# Patient Record
Sex: Female | Born: 1950 | Race: White | Hispanic: No | Marital: Married | Smoking: Former smoker
Health system: Southern US, Community
[De-identification: ages and names within clinical notes are randomized; demographics above are authoritative.]

## PROBLEM LIST (undated history)

## (undated) DIAGNOSIS — R112 Nausea with vomiting, unspecified: Secondary | ICD-10-CM

## (undated) DIAGNOSIS — F4001 Agoraphobia with panic disorder: Secondary | ICD-10-CM

## (undated) DIAGNOSIS — Z9889 Other specified postprocedural states: Secondary | ICD-10-CM

## (undated) DIAGNOSIS — I10 Essential (primary) hypertension: Secondary | ICD-10-CM

## (undated) DIAGNOSIS — I480 Paroxysmal atrial fibrillation: Secondary | ICD-10-CM

## (undated) DIAGNOSIS — F419 Anxiety disorder, unspecified: Secondary | ICD-10-CM

## (undated) DIAGNOSIS — K219 Gastro-esophageal reflux disease without esophagitis: Secondary | ICD-10-CM

## (undated) DIAGNOSIS — I959 Hypotension, unspecified: Secondary | ICD-10-CM

## (undated) DIAGNOSIS — F329 Major depressive disorder, single episode, unspecified: Secondary | ICD-10-CM

## (undated) DIAGNOSIS — T8859XA Other complications of anesthesia, initial encounter: Secondary | ICD-10-CM

## (undated) DIAGNOSIS — T4145XA Adverse effect of unspecified anesthetic, initial encounter: Secondary | ICD-10-CM

## (undated) DIAGNOSIS — K635 Polyp of colon: Secondary | ICD-10-CM

## (undated) DIAGNOSIS — I639 Cerebral infarction, unspecified: Secondary | ICD-10-CM

## (undated) DIAGNOSIS — E119 Type 2 diabetes mellitus without complications: Secondary | ICD-10-CM

## (undated) DIAGNOSIS — M1990 Unspecified osteoarthritis, unspecified site: Secondary | ICD-10-CM

## (undated) DIAGNOSIS — F32A Depression, unspecified: Secondary | ICD-10-CM

## (undated) DIAGNOSIS — G8929 Other chronic pain: Secondary | ICD-10-CM

## (undated) DIAGNOSIS — E669 Obesity, unspecified: Secondary | ICD-10-CM

## (undated) DIAGNOSIS — E039 Hypothyroidism, unspecified: Secondary | ICD-10-CM

## (undated) HISTORY — PX: ABDOMINAL HYSTERECTOMY: SHX81

## (undated) HISTORY — DX: Major depressive disorder, single episode, unspecified: F32.9

## (undated) HISTORY — DX: Obesity, unspecified: E66.9

## (undated) HISTORY — DX: Agoraphobia with panic disorder: F40.01

## (undated) HISTORY — DX: Depression, unspecified: F32.A

## (undated) HISTORY — DX: Type 2 diabetes mellitus without complications: E11.9

## (undated) HISTORY — DX: Anxiety disorder, unspecified: F41.9

## (undated) HISTORY — DX: Cerebral infarction, unspecified: I63.9

## (undated) HISTORY — PX: CHOLECYSTECTOMY: SHX55

## (undated) HISTORY — DX: Essential (primary) hypertension: I10

## (undated) HISTORY — DX: Polyp of colon: K63.5

## (undated) HISTORY — DX: Paroxysmal atrial fibrillation: I48.0

## (undated) HISTORY — DX: Hypotension, unspecified: I95.9

---

## 2002-07-17 HISTORY — PX: CARDIAC CATHETERIZATION: SHX172

## 2003-04-05 ENCOUNTER — Inpatient Hospital Stay (HOSPITAL_COMMUNITY): Admission: EM | Admit: 2003-04-05 | Discharge: 2003-04-06 | Payer: Self-pay | Admitting: Cardiology

## 2004-09-20 ENCOUNTER — Emergency Department (HOSPITAL_COMMUNITY): Admission: EM | Admit: 2004-09-20 | Discharge: 2004-09-20 | Payer: Self-pay | Admitting: Emergency Medicine

## 2006-07-29 ENCOUNTER — Emergency Department (HOSPITAL_COMMUNITY): Admission: EM | Admit: 2006-07-29 | Discharge: 2006-07-29 | Payer: Self-pay | Admitting: Emergency Medicine

## 2006-10-22 ENCOUNTER — Emergency Department (HOSPITAL_COMMUNITY): Admission: EM | Admit: 2006-10-22 | Discharge: 2006-10-22 | Payer: Self-pay | Admitting: Emergency Medicine

## 2008-05-16 ENCOUNTER — Emergency Department (HOSPITAL_COMMUNITY): Admission: EM | Admit: 2008-05-16 | Discharge: 2008-05-16 | Payer: Self-pay | Admitting: Emergency Medicine

## 2008-08-25 ENCOUNTER — Emergency Department (HOSPITAL_COMMUNITY): Admission: EM | Admit: 2008-08-25 | Discharge: 2008-08-25 | Payer: Self-pay | Admitting: Emergency Medicine

## 2008-09-16 ENCOUNTER — Ambulatory Visit (HOSPITAL_COMMUNITY): Admission: RE | Admit: 2008-09-16 | Discharge: 2008-09-16 | Payer: Self-pay | Admitting: Family Medicine

## 2008-10-03 ENCOUNTER — Emergency Department (HOSPITAL_COMMUNITY): Admission: EM | Admit: 2008-10-03 | Discharge: 2008-10-03 | Payer: Self-pay | Admitting: Emergency Medicine

## 2008-10-30 ENCOUNTER — Ambulatory Visit (HOSPITAL_COMMUNITY): Admission: RE | Admit: 2008-10-30 | Discharge: 2008-10-30 | Payer: Self-pay | Admitting: Emergency Medicine

## 2008-10-30 ENCOUNTER — Emergency Department (HOSPITAL_COMMUNITY): Admission: EM | Admit: 2008-10-30 | Discharge: 2008-10-30 | Payer: Self-pay | Admitting: Emergency Medicine

## 2008-11-15 ENCOUNTER — Emergency Department (HOSPITAL_COMMUNITY): Admission: EM | Admit: 2008-11-15 | Discharge: 2008-11-15 | Payer: Self-pay | Admitting: Emergency Medicine

## 2008-12-09 ENCOUNTER — Encounter (HOSPITAL_COMMUNITY): Admission: RE | Admit: 2008-12-09 | Discharge: 2009-01-08 | Payer: Self-pay | Admitting: Orthopaedic Surgery

## 2008-12-14 ENCOUNTER — Emergency Department (HOSPITAL_COMMUNITY): Admission: EM | Admit: 2008-12-14 | Discharge: 2008-12-14 | Payer: Self-pay | Admitting: Emergency Medicine

## 2009-01-21 ENCOUNTER — Encounter (HOSPITAL_COMMUNITY): Admission: RE | Admit: 2009-01-21 | Discharge: 2009-02-20 | Payer: Self-pay | Admitting: Orthopaedic Surgery

## 2009-05-07 ENCOUNTER — Emergency Department (HOSPITAL_COMMUNITY): Admission: EM | Admit: 2009-05-07 | Discharge: 2009-05-07 | Payer: Self-pay | Admitting: Emergency Medicine

## 2009-12-12 ENCOUNTER — Emergency Department (HOSPITAL_COMMUNITY): Admission: EM | Admit: 2009-12-12 | Discharge: 2009-12-12 | Payer: Self-pay | Admitting: Emergency Medicine

## 2009-12-13 ENCOUNTER — Ambulatory Visit (HOSPITAL_COMMUNITY): Admission: RE | Admit: 2009-12-13 | Discharge: 2009-12-13 | Payer: Self-pay | Admitting: Family Medicine

## 2010-08-08 ENCOUNTER — Encounter: Payer: Self-pay | Admitting: Orthopaedic Surgery

## 2010-10-03 LAB — URINE MICROSCOPIC-ADD ON

## 2010-10-03 LAB — COMPREHENSIVE METABOLIC PANEL
ALT: 18 U/L (ref 0–35)
AST: 19 U/L (ref 0–37)
Alkaline Phosphatase: 76 U/L (ref 39–117)
CO2: 25 mEq/L (ref 19–32)
Calcium: 9.4 mg/dL (ref 8.4–10.5)
GFR calc Af Amer: >60 mL/min (ref >60)
GFR calc non Af Amer: >60 mL/min (ref >60)
Potassium: 4.2 mEq/L (ref 3.5–5.1)
Sodium: 133 mEq/L — ABNORMAL LOW (ref 135–145)
Total Protein: 6.9 g/dL (ref 6.0–8.3)

## 2010-10-03 LAB — CBC
Hemoglobin: 11.8 g/dL — ABNORMAL LOW (ref 12.0–15.0)
MCHC: 33.9 g/dL (ref 30.0–36.0)
RBC: 3.75 MIL/uL — ABNORMAL LOW (ref 3.87–5.11)

## 2010-10-03 LAB — URINALYSIS, ROUTINE W REFLEX MICROSCOPIC
Bilirubin Urine: NEGATIVE
Glucose, UA: NEGATIVE mg/dL
Hgb urine dipstick: NEGATIVE
Specific Gravity, Urine: >1.03 — ABNORMAL HIGH (ref 1.005–1.030)
Urobilinogen, UA: 0.2 mg/dL (ref 0.0–1.0)
pH: 5 (ref 5.0–8.0)

## 2010-10-03 LAB — DIFFERENTIAL
Basophils Relative: 0 % (ref 0–1)
Eosinophils Absolute: 0.1 10*3/uL (ref 0.0–0.7)
Eosinophils Relative: 2 % (ref 0–5)
Lymphs Abs: 2.3 10*3/uL (ref 0.7–4.0)
Monocytes Relative: 4 % (ref 3–12)

## 2010-10-03 LAB — URINE CULTURE

## 2010-10-27 LAB — GLUCOSE, CAPILLARY: Glucose-Capillary: 201 mg/dL — ABNORMAL HIGH (ref 70–99)

## 2010-11-07 ENCOUNTER — Emergency Department (HOSPITAL_COMMUNITY)
Admission: EM | Admit: 2010-11-07 | Discharge: 2010-11-07 | Disposition: A | Discharge status: Home / Self Care | Payer: Medicaid Other | Attending: Emergency Medicine | Admitting: Emergency Medicine

## 2010-11-07 DIAGNOSIS — R112 Nausea with vomiting, unspecified: Secondary | ICD-10-CM | POA: Insufficient documentation

## 2010-11-07 DIAGNOSIS — B9789 Other viral agents as the cause of diseases classified elsewhere: Secondary | ICD-10-CM | POA: Insufficient documentation

## 2010-11-07 DIAGNOSIS — E119 Type 2 diabetes mellitus without complications: Secondary | ICD-10-CM | POA: Insufficient documentation

## 2010-11-07 DIAGNOSIS — R5381 Other malaise: Secondary | ICD-10-CM | POA: Insufficient documentation

## 2010-11-07 DIAGNOSIS — R070 Pain in throat: Secondary | ICD-10-CM | POA: Insufficient documentation

## 2010-11-07 DIAGNOSIS — R51 Headache: Secondary | ICD-10-CM | POA: Insufficient documentation

## 2010-11-07 DIAGNOSIS — Z79899 Other long term (current) drug therapy: Secondary | ICD-10-CM | POA: Insufficient documentation

## 2010-12-02 NOTE — Group Therapy Note (Signed)
   NAME:  Megan [name], Megan [name]                     ACCOUNT NO.:  [id number]   MEDICAL RECORD NO.:  [id number]                   PATIENT TYPE:  EMS   LOCATION:  ED                                   FACILITY:  [facility]   PHYSICIAN:  Mila Homer. Sudie Bailey, M.D.           DATE OF BIRTH:  Mar 31, 1951   DATE OF PROCEDURE:  04/05/2003  DATE OF DISCHARGE:  04/05/2003                                   PROGRESS NOTE   SUBJECTIVE:  A 60 year old who called me this morning, Sunday morning,  telling me she was having smothering in her chest and having chest  discomfort.  She felt there was a knot or swelling in the anterior chest.  Apparently this happened yesterday morning and then cleared, and then last  night about 3 a.m. began to get more of the smothering and chest discomfort.  She became very upset with this.   She has no known history of coronary artery disease.  Mom apparently died of  a heart attack.  The patient takes Valium q.a.m., is on a BP pill which she  did not bring.   OBJECTIVE:  She is tearful.  Color is good.  Pulse is 88, blood pressure  200/95.  Respiratory rate was 16.  The lungs were clear throughout on  auscultation.  There were no intercostal retractions.  There was no use of  accessory muscles of respiration.  Heart had a regular rhythm without  murmur, rate of 80.  Abdomen was soft.  There was no edema of the ankles.   During exam she was crying and tearful.  Her husband was present.  She did  not want blood taken.   EKG showed a normal sinus rhythm, rate of 69.  There was normal R wave  progression across the precordium.   ASSESSMENT:  1. Probable acute anxiety attack.  2. Family history for coronary artery disease.  3. Obesity.  4. Hypertension.   PLAN:  I strongly recommended to the patient and her husband that she at  least have a set of cardiac enzymes and blood count.  Will keep her on the  monitor.  She will get another milligram of alprazolam p.o.  If she is  much  improved on this, perfectly stable, breathing well, no chest pain, will  consider discharge; if there is any further pain will need to be at least  admitted, observation.  Full discussion with the patient and her husband.                                               Mila Homer. Sudie Bailey, M.D.    SDK/MEDQ  D:  04/05/2003  T:  04/06/2003  Job:  [id number]

## 2010-12-02 NOTE — Discharge Summary (Signed)
Megan [name], Megan [name]                     ACCOUNT NO.:  [id number]   MEDICAL RECORD NO.:  [id number]                   PATIENT TYPE:  INP   LOCATION:  2029                                 FACILITY:  [facility]   PHYSICIAN:  [place] Bing, M.D.               DATE OF BIRTH:  09-21-1950   DATE OF ADMISSION:  04/05/2003  DATE OF DISCHARGE:  04/06/2003                           DISCHARGE SUMMARY - REFERRING   DISCHARGE DIAGNOSES:  1. Chest pain, noncardiac.  2. Normal ejection fraction.  3. Hypertension, treated.  4. Diabetes mellitus type 2.  5. Anxiety/depression.  6. Obesity.   HOSPITAL COURSE:  Megan [name] is a 60 year old female who is admitted to  [facility] as a transfer from [facility] for further evaluation  of chest pain.  Over the past two days prior to admission she had three  episodes where she was awakened with severe substernal chest pain.  The pain  resolved spontaneously in less than five minutes.  After an episode on the  afternoon of March 31, 2003 she presented to the [facility] Emergency  Room where her initial blood pressure as 200/95.  It was noted that she was  very anxious at this time.  The EKG was normal.  However, her troponin was  mildly elevated at 0.08.  She was transferred to [facility] for further  evaluation.  Further evaluation included laboratory work which showed total  cholesterol 195, triglycerides 321, HDL 41, LDL 90.  TSH pending.  D-dimer  less than 0.22.  White count 9.9, hemoglobin 11.8, hematocrit 34.3,  platelets 214,000.  Sodium 137, potassium 3.5 (replaced), BUN 6, creatinine  0.8.  Cardiac isoenzymes essentially negative with the exception of elevated  troponin of 0.08 on admission.  BNP 215.  TSH pending.   She ultimately underwent cardiac catheterization on April 06, 2003.  Was  found essentially to have normal coronary arteries with a normal ejection  fraction.  It was felt to be noncardiac chest pain.   The patient underwent  one hour bed rest.  Please note that she was closed with AngioSeal vascular  closure device.  Because of the closure device and the patient's weight, we  will go ahead and see her in follow-up at the [facility] at  [facility] in [place] at 1:30 p.m. on April 13, 2003.  Also,  asked her to make a follow-up appointment with Mila Homer. Sudie Bailey, M.D.  within one month.  She is to continue her home medications.   DISCHARGE MEDICATIONS:  1. Valium.  2. HCTZ.  3. Lorcet.  4. Blood pressure pill.  Her blood pressure has actually been under good     control since she has been here.  She does need to bring her medications     to her visit.  5. She may use Tylenol - one to two tablets q.6h. as needed for pain.   ACTIVITY:  No  strenuous activity for two days.  May gradually increase  activity.   DIET:  Remain on low fat diet.   WOUND CARE:  Clean over cath site with soap and water.    DISCHARGE INSTRUCTIONS:  Call for any questions or concerns.   The patient also needs to work on risk stratification and this may be  followed up with her primary care physician.      Guy Franco, P.A. [facility]                      [place] Bing, M.D.    LB/MEDQ  D:  04/06/2003  T:  04/06/2003  Job:  [id number]   cc:   Thomas C. Wall, M.D.   Mila Homer. Sudie Bailey, M.D.  [address] [place], [place] [address]  Fax: [phone]

## 2010-12-02 NOTE — Cardiovascular Report (Signed)
   NAME:  Megan [name], Megan [name]                     ACCOUNT NO.:  [id number]   MEDICAL RECORD NO.:  [id number]                   PATIENT TYPE:  INP   LOCATION:  2029                                 FACILITY:  [facility]   PHYSICIAN:  Carole Binning, M.D. [facility]         DATE OF BIRTH:  [birth year]   DATE OF PROCEDURE:  04/06/2003  DATE OF DISCHARGE:                              CARDIAC CATHETERIZATION   PROCEDURE PERFORMED:  Left heart catheterization with coronary angiography  and left ventriculography.   INDICATIONS:  [name] is a 60 year old woman with diabetes mellitus and  multiple cardiac risk factors.  She was admitted to the hospital with chest  pain worrisome for unstable angina.  She was referred for cardiac  catheterization.   PROCEDURAL NOTE:  A 6-French sheath was placed in the right femoral artery.  Coronary angiography was performed with 6-French JL4 and JR4 catheters.  Left ventriculography was performed with an angled pigtail catheter.  Contrast was Omnipaque.  At the conclusion of the procedure an Angio-Seal  vascular closure device was placed in the right femoral artery with good  hemostasis.  There were no complications.   RESULTS:  HEMODYNAMICS:  Left ventricular pressure 150/25.  Aortic pressure 154/82.  There is no  aortic valve gradient.   LEFT VENTRICULOGRAM:  Wall motion is normal.  Ejection fraction estimated at greater than or equal  to 65%.  There is trace mitral regurgitation.   CORONARY ARTERIOGRAPHY:  (Left dominant)  Left main is normal.   Left anterior descending artery gives rise to a normal sized first diagonal  branch.  The LAD is normal.   Left circumflex is a large, dominant vessel.  It gives rise to a small first  marginal and a large second marginal branch.  The distal circumflex gives  rise to two small posterolateral branches and a small to normal sized  posterior descending artery.  The left circumflex is normal.   Right coronary  artery is a small nondominant vessel.  The right coronary  artery is normal.    IMPRESSION:  1. Normal left ventricular systolic function.  2. Normal coronary arteries by angiography.   PLAN:  The patient will be managed medically.  She needs aggressive risk  factor modification.                                               Carole Binning, M.D. [facility]    MWP/MEDQ  D:  04/06/2003  T:  04/06/2003  Job:  [id number]   cc:   Mila Homer. Sudie Bailey, M.D.  [address] [place], [place] [address]  Fax: [phone]    Bing, M.D.   Cath Lab

## 2010-12-02 NOTE — H&P (Signed)
NAME:  Megan [name], Megan [name]                     ACCOUNT NO.:  [id number]   MEDICAL RECORD NO.:  [id number]                   PATIENT TYPE:  INP   LOCATION:  2029                                 FACILITY:  [facility]   PHYSICIAN:  Arvilla Meres, M.D. [facility]          DATE OF BIRTH:  [birth year]   DATE OF ADMISSION:  04/05/2003  DATE OF DISCHARGE:                                HISTORY & PHYSICAL   PRIMARY CARE PHYSICIAN:  Mila Homer. Sudie Bailey, M.D.   HISTORY OF PRESENT ILLNESS:  Megan [name] is a 60 year old obese white  female with a history of hypertension, diabetes, anxiety, but no known  coronary disease who was transferred from [facility] Emergency Room for  further evaluation of chest pain.   Megan [name] denies any previous history of known coronary disease or  previous cardiac testing.  Over the past two days she has had three episodes  where she has been awakened from sleep with severe substernal chest pressure  and shortness of breath.  This was not accompanied by diaphoresis, nausea,  vomiting, radiation, or an acid taste in her mouth.  She states that the  pain resolved spontaneously and in under five minutes after she repositioned  herself.  After her third episode this afternoon she went to [facility]  Emergency Room where her initial blood pressure was 200/95.  She was very  anxious at that time.  She had a normal EKG and her CK and MB were normal.  However, her troponin was just mildly elevated at 0.08 so she was sent to  [facility] for further evaluation.  She is currently chest pain free but she  remains anxious and tearful.   REVIEW OF SYSTEMS:  She says she can walk up to one mile at a time and climb  stairs without any chest pain or undue shortness of breath.  She also  complains of severe tooth pain secondary to an infected molar.  She also  has some right shoulder pain which she attributes to rotator cuff injury.  Otherwise, she denies fevers, chills, nausea,  vomiting, abdominal pain,  neurologic symptoms, CHF, any long car rides, or trauma.  She does have some  daytime sleepiness, but no apnea.   PAST MEDICAL HISTORY:  1. Obesity.  2. Diabetes mellitus x1-2 years.  3. Hypertension.  4. Anxiety/depression.  5. Medical noncompliance.  6. Low back pain.  7. Status post cholecystectomy.  8. Status post total abdominal hysterectomy.   MEDICATIONS:  1. Valium 5 mg.  2. Hydrochlorothiazide 25 mg.  3. Blood pressure pill.  4. Lorcet.   ALLERGIES:  She has no known drug allergies.   SOCIAL HISTORY:  She lives with her husband in [place].  She is an  unemployed former Neurosurgeon.  She denies any tobacco or alcohol.   FAMILY HISTORY:  Significant for the fact that her mother had CHF in her  7s, but no coronary disease that she knows of.  PHYSICAL EXAMINATION:  GENERAL:  She is a bit anxious and tearful.  VITAL SIGNS:  She is afebrile.  Blood pressure 184/66, heart rate 66.  She  is saturating 98% on room air.  Respirations are unlabored.  HEENT:  Her sclerae are anicteric.  Her oropharynx is clear.  There are some  dental caries, but no obvious abscess.  NECK:  Supple, but large.  There is no JVD appreciated.  There are no  carotid bruits.  There is no lymphadenopathy or thyromegaly.  CARDIAC:  She has a regular rate and rhythm with normal S1, S2.  No obvious  murmurs, rubs, or gallops.  LUNGS:  Clear to auscultation.  ABDOMEN:  Obese, nontender, nondistended with well healed surgical scars.  EXTREMITIES:  She moves all four.  There is no clubbing, cyanosis, edema.  She has 2+ pulses in all four extremities.  Femoral pulses are 2+  bilaterally without bruits.   LABORATORIES:  White count 8.1, hematocrit 37, platelets 220,000.  Sodium  137, potassium 3.9, BUN 6, creatinine 0.6, glucose 196.  Her blood gas shows  a pH of 7.47, pCO2 33, pAO2 of 103, saturating 99% on room air.  CK 43 with  an MB fraction 0.6, troponin 0.08.  EKG  shows normal sinus rhythm at 62 with  no ischemia.   ASSESSMENT:  This is a 60 year old white female as above with multiple  cardiac risk factors, but no known history of coronary artery disease that  presents with three episodes of nocturnal chest pain with borderline  troponin.  The quality of her symptoms is concerning for angina but the lack  of an exertional component confounds the issue a bit.  I discussed the  options of noninvasive testing versus cardiac catheterization with both Ms.  [name] and her family and the plan will be to proceed with cardiac  catheterization to clearly define her coronary anatomy.   PLAN:  1. Admit on telemetry.  Cycle cardiac enzymes to rule out myocardial     infarction.  2. Treat with heparin, aspirin, beta blocker, nitroglycerin, Statin, ACE     inhibitor, and possibly Metformin after catheterization for what seems to     be metabolic syndrome.  3. Cardiac catheterization.  4. She needs an aggressive risk factor management for her cardiac risk     factors.  5. We will check her lipids, hemoglobin A1C, and place her on a sliding     scale insulin regimen.                                                Arvilla Meres, M.D. [facility]    DB/MEDQ  D:  04/05/2003  T:  04/06/2003  Job:  [id number]

## 2011-01-13 ENCOUNTER — Other Ambulatory Visit (HOSPITAL_COMMUNITY): Payer: Self-pay | Admitting: Family Medicine

## 2011-01-13 ENCOUNTER — Ambulatory Visit (HOSPITAL_COMMUNITY)
Admission: RE | Admit: 2011-01-13 | Discharge: 2011-01-13 | Disposition: A | Discharge status: Home / Self Care | Payer: Medicaid Other | Source: Ambulatory Visit | Attending: Family Medicine | Admitting: Family Medicine

## 2011-01-13 DIAGNOSIS — R1032 Left lower quadrant pain: Secondary | ICD-10-CM | POA: Insufficient documentation

## 2011-01-13 MED ORDER — IOHEXOL 300 MG/ML  SOLN
100.0000 mL | Freq: Once | INTRAMUSCULAR | Status: AC | PRN
  Administered 2011-01-13: 100 mL via INTRAVENOUS

## 2011-01-25 ENCOUNTER — Other Ambulatory Visit (HOSPITAL_COMMUNITY): Payer: Self-pay | Admitting: Family Medicine

## 2011-01-25 ENCOUNTER — Ambulatory Visit (HOSPITAL_COMMUNITY)
Admission: RE | Admit: 2011-01-25 | Discharge: 2011-01-25 | Disposition: A | Discharge status: Home / Self Care | Payer: Medicaid Other | Source: Ambulatory Visit | Attending: Family Medicine | Admitting: Family Medicine

## 2011-01-25 DIAGNOSIS — M5137 Other intervertebral disc degeneration, lumbosacral region: Secondary | ICD-10-CM | POA: Insufficient documentation

## 2011-01-25 DIAGNOSIS — M546 Pain in thoracic spine: Secondary | ICD-10-CM | POA: Insufficient documentation

## 2011-01-25 DIAGNOSIS — M545 Low back pain, unspecified: Secondary | ICD-10-CM

## 2011-01-25 DIAGNOSIS — M51379 Other intervertebral disc degeneration, lumbosacral region without mention of lumbar back pain or lower extremity pain: Secondary | ICD-10-CM | POA: Insufficient documentation

## 2011-01-25 DIAGNOSIS — M899 Disorder of bone, unspecified: Secondary | ICD-10-CM | POA: Insufficient documentation

## 2011-04-04 LAB — COMPREHENSIVE METABOLIC PANEL
ALT: 14 U/L (ref 7–35)
HCT: 40 %
MCV: 98.6 fL
WBC: 8.5

## 2011-04-05 ENCOUNTER — Ambulatory Visit (INDEPENDENT_AMBULATORY_CARE_PROVIDER_SITE_OTHER): Payer: Medicaid Other | Admitting: Gastroenterology

## 2011-04-05 ENCOUNTER — Encounter: Payer: Self-pay | Admitting: Gastroenterology

## 2011-04-05 VITALS — BP 158/92 | HR 76 | Temp 97.1°F | Ht 62.0 in | Wt 204.2 lb

## 2011-04-05 DIAGNOSIS — Z86010 Personal history of colonic polyps: Secondary | ICD-10-CM

## 2011-04-05 DIAGNOSIS — Z800 Family history of malignant neoplasm of digestive organs: Secondary | ICD-10-CM

## 2011-04-05 DIAGNOSIS — R112 Nausea with vomiting, unspecified: Secondary | ICD-10-CM | POA: Insufficient documentation

## 2011-04-05 DIAGNOSIS — R109 Unspecified abdominal pain: Secondary | ICD-10-CM

## 2011-04-05 NOTE — Patient Instructions (Addendum)
We will get copy of labs from Dr. Sudie Bailey. If any further labs needed, we will call you. You are being scheduled for upper endoscopy to evaluate your abdominal pain and vomiting. Colonoscopy to evaluate your abdominal pain and for your history of colon polyps and family history of colon cancer.

## 2011-04-05 NOTE — Progress Notes (Signed)
Primary Care Physician:  Milana Obey, MD  Primary Gastroenterologist:  Roetta Sessions, MD   Chief Complaint  Patient presents with  . Abdominal Pain    HPI:  Megan [name] is a 60 y.o. female here for furthe evaluation of LUQ pain for past two to three months. Took antibiotics twice and pain medications but pain continues to come back. Starting Cipro/Flagy today for ?diverticulitis. CT A/P in 11/2010 failed to show cause of pain.  Last colonoscopy by Dr. Jena Gauss over ten years ago per patient. Had two polyps. FH very significant for numerous members with colon cancer at young age.  BM were irregular up until 3 weeks ago. Put on strict DM diet, now BM regular. Few days of watery stools. No melena, brbpr. No fever. Some intermittent vomiting, about two times per week. Stays nauseated. DM for over ten years. No heartburn. No dysphagia. Weight stable. Nausea happens pp. Not as bad when eats real light. Room spins, nausea gets worse. Better when lays down. No hematemesis. No melena. Had labs done.   Current Outpatient Prescriptions  Medication Sig Dispense Refill  . ciprofloxacin (CIPRO) 500 MG tablet Take 500 mg by mouth 2 (two) times daily.        . diazepam (VALIUM) 10 MG tablet Take 10 mg by mouth at bedtime as needed.        . enalapril (VASOTEC) 20 MG tablet Take 20 mg by mouth daily.        Marland Kitchen glipiZIDE (GLUCOTROL) 10 MG tablet Take 10 mg by mouth 1 day or 1 dose. In the AM       . hydrochlorothiazide (HYDRODIURIL) 25 MG tablet Take 25 mg by mouth daily.        Marland Kitchen HYDROcodone-acetaminophen (VICODIN) 5-500 MG per tablet Take 1 tablet by mouth every 4 (four) hours as needed.        . metFORMIN (GLUMETZA) 500 MG (MOD) 24 hr tablet Take 500 mg by mouth daily with breakfast. Take two tablets twice daily       . metoprolol (TOPROL-XL) 50 MG 24 hr tablet Take 50 mg by mouth daily.        . metroNIDAZOLE (FLAGYL) 500 MG tablet Take 500 mg by mouth 3 (three) times daily.        . traZODone  (DESYREL) 100 MG tablet Take 100 mg by mouth at bedtime.          Allergies as of 04/05/2011 - Review Complete 04/05/2011  Allergen Reaction Noted  . Naproxen  04/05/2011    Past Medical History  Diagnosis Date  . HTN (hypertension)   . DM (diabetes mellitus)   . Anxiety   . Depression   . Obesity   . Colon polyps     Past Surgical History  Procedure Date  . Cardiac catheterization 2004    normal  . Cholecystectomy   . S/p hysterectomy     complete    Family History  Problem Relation Age of Onset  . Colon cancer Paternal Aunt     2, colon cancer  . Colon cancer Paternal Uncle     6, colon cancer  . Colon cancer Father     deceased, age 17  . Colon cancer      numerous cousins died before age 73 with colon cancer  . Colon cancer Sister     deceased, age 34    History   Social History  . Marital Status: Married    Spouse Name: N/A  Number of Children: 3  . Years of Education: N/A   Occupational History  . disability    Social History Main Topics  . Smoking status: Current Some Day Smoker  . Smokeless tobacco: Not on file   Comment: one pack per month, never more than 1-2 cig/day in entire life  . Alcohol Use: No  . Drug Use: No  . Sexually Active: Not on file   Other Topics Concern  . Not on file   Social History Narrative   No contact with children since they left home at age 69. Doesn't know where they are.      ROS:  General: Negative for anorexia, weight loss, fever, chills, fatigue, weakness. Eyes: Negative for vision changes.  ENT: Negative for hoarseness, difficulty swallowing , nasal congestion. CV: Negative for chest pain, angina, palpitations, dyspnea on exertion, peripheral edema.  Respiratory: Negative for dyspnea at rest, dyspnea on exertion, cough, sputum, wheezing.  GI: See history of present illness. GU:  Negative for dysuria, hematuria, urinary incontinence, urinary frequency, nocturnal urination.  MS: Negative for joint  pain, low back pain.  Derm: Negative for rash or itching.  Neuro: Negative for weakness, abnormal sensation, seizure, frequent headaches, memory loss, confusion.  Psych: Positive for anxiety, depression. Negative for suicidal ideation, hallucinations.  Endo: Negative for unusual weight change.  Heme: Negative for bruising or bleeding. Allergy: Negative for rash or hives.    Physical Examination:  BP 158/92  Pulse 76  Temp(Src) 97.1 F (36.2 C) (Temporal)  Ht 5\' 2"  (1.575 m)  Wt 204 lb 3.2 oz (92.625 kg)  BMI 37.35 kg/m2   General: Well-nourished, well-developed in no acute distress. Obese. Head: Normocephalic, atraumatic.   Eyes: Conjunctiva pink, no icterus. Mouth: Oropharyngeal mucosa moist and pink , no lesions erythema or exudate. Edentulous. Neck: Supple without thyromegaly, masses, or lymphadenopathy.  Lungs: Clear to auscultation bilaterally.  Heart: Regular rate and rhythm, no murmurs rubs or gallops.  Abdomen: Bowel sounds are normal, nondistended, no hepatosplenomegaly or masses, no abdominal bruits or    hernia , no rebound or guarding.  Mild left sided abd tenderness with light palpation. No focal tenderness. Rectal: Defer to time of colonoscopy. Extremities: No lower extremity edema. No clubbing or deformities.  Neuro: Alert and oriented x 4 , grossly normal neurologically.  Skin: Warm and dry, no rash or jaundice.   Psych: Alert and cooperative, normal mood and affect.  Labs: OLD LABS FROM 2011 Lab Results  Component Value Date   WBC 7.5 12/12/2009   HGB 11.8* 12/12/2009   HCT 34.7* 12/12/2009   MCV 92.5 12/12/2009   PLT 220 12/12/2009   Lab Results  Component Value Date   ALT 18 12/12/2009   AST 19 12/12/2009   ALKPHOS 76 12/12/2009   BILITOT 0.5 12/12/2009   Lab Results  Component Value Date   CREATININE 0.82 12/12/2009   BUN 17 12/12/2009   NA 133* 12/12/2009   K 4.2 12/12/2009   CL 102 12/12/2009   CO2 25 12/12/2009     Imaging Studies: CT A/P With CM  (12/2010) IMPRESSION:  No acute abdominal/pelvic findings, mass lesions or adenopathy

## 2011-04-05 NOTE — Assessment & Plan Note (Signed)
Numerous family members with reported colon cancer at young age. Due for high risk screen. TCS to be done with deep sedation in OR given polypharmacy and inadequate conscious sedation.  I have discussed the risks, alternatives, benefits with regards to but not limited to the risk of reaction to medication, bleeding, infection, perforation and the patient is agreeable to proceed. Written consent to be obtained.

## 2011-04-05 NOTE — Progress Notes (Signed)
Cc to PCP 

## 2011-04-05 NOTE — Assessment & Plan Note (Signed)
EGD as planned. ?PUD vs gastroparesis. Obtain records from PCP.

## 2011-04-05 NOTE — Assessment & Plan Note (Signed)
Several month h/o persistent left-sided abdominal pain. Per patient, she has had several rounds of antibiotics for ?diverticulitis. Nothing to explain pain on CT A/P 11/2010. Doubt recurrent or persistent diverticulitis given multiple rounds of antibiotics. She will complete current round of cipro/flagyl. We will get records of labs from Dr. Sudie Bailey. Plan for EGD/colonoscopy in 3 weeks for further evaluation.  I have discussed the risks, alternatives, benefits with regards to but not limited to the risk of reaction to medication, bleeding, infection, perforation and the patient is agreeable to proceed. Written consent to be obtained.  Procedure to be done with deep sedation in OR as patient describes having inadequate conscious sedation before and for polypharmacy.

## 2011-04-27 ENCOUNTER — Encounter (HOSPITAL_COMMUNITY): Payer: Self-pay

## 2011-04-27 ENCOUNTER — Encounter (HOSPITAL_COMMUNITY)
Admission: RE | Admit: 2011-04-27 | Discharge: 2011-04-27 | Disposition: A | Discharge status: Home / Self Care | Payer: Medicaid Other | Source: Ambulatory Visit | Attending: Internal Medicine | Admitting: Internal Medicine

## 2011-04-27 ENCOUNTER — Other Ambulatory Visit: Payer: Self-pay

## 2011-04-27 HISTORY — DX: Adverse effect of unspecified anesthetic, initial encounter: T41.45XA

## 2011-04-27 HISTORY — DX: Nausea with vomiting, unspecified: R11.2

## 2011-04-27 HISTORY — DX: Other complications of anesthesia, initial encounter: T88.59XA

## 2011-04-27 HISTORY — DX: Other specified postprocedural states: Z98.890

## 2011-04-27 LAB — CBC
HCT: 37.4 % (ref 36.0–46.0)
Hemoglobin: 12.7 g/dL (ref 12.0–15.0)
MCH: 31.8 pg (ref 26.0–34.0)
MCV: 93.7 fL (ref 78.0–100.0)
RBC: 3.99 MIL/uL (ref 3.87–5.11)

## 2011-04-27 LAB — BASIC METABOLIC PANEL
BUN: 9 mg/dL (ref 6–23)
CO2: 31 mEq/L (ref 19–32)
Calcium: 9.9 mg/dL (ref 8.4–10.5)
Chloride: 97 mEq/L (ref 96–112)
Creatinine, Ser: 0.6 mg/dL (ref 0.50–1.10)
Glucose, Bld: 269 mg/dL — ABNORMAL HIGH (ref 70–99)

## 2011-04-27 NOTE — Patient Instructions (Addendum)
[address] [place] [address] [place]  04/27/2011   Your procedure is scheduled on:   05/04/2011  Report to [facility] at  700  AM.  Call this number if you have problems the morning of surgery: [phone]   Remember:   Do not eat food:After Midnight.  Do not drink clear liquids: After Midnight.  Take these medicines the morning of surgery with A SIP OF WATER:  Valium,vasotec,toprol   Do not wear jewelry, make-up or nail polish.  Do not wear lotions, powders, or perfumes. You may wear deodorant.  Do not shave 48 hours prior to surgery.  Do not bring valuables to the hospital.  Contacts, dentures or bridgework may not be worn into surgery.  Leave suitcase in the car. After surgery it may be brought to your room.  For patients admitted to the hospital, checkout time is 11:00 AM the day of discharge.   Patients discharged the day of surgery will not be allowed to drive home.  Name and phone number of your driver: family  Special Instructions: N/A   Please read over the following fact sheets that you were given: Pain Booklet, Surgical Site Infection Prevention, Anesthesia Post-op Instructions and Care and Recovery After Surgery PATIENT INSTRUCTIONS POST-ANESTHESIA  IMMEDIATELY FOLLOWING SURGERY:  Do not drive or operate machinery for the first twenty four hours after surgery.  Do not make any important decisions for twenty four hours after surgery or while taking narcotic pain medications or sedatives.  If you develop intractable nausea and vomiting or a severe headache please notify your doctor immediately.  FOLLOW-UP:  Please make an appointment with your surgeon as instructed. You do not need to follow up with anesthesia unless specifically instructed to do so.  WOUND CARE INSTRUCTIONS (if applicable):  Keep a dry clean dressing on the anesthesia/puncture wound site if there is drainage.  Once the wound has quit draining you may leave it open to air.  Generally you should leave the bandage intact for  twenty four hours unless there is drainage.  If the epidural site drains for more than 36-48 hours please call the anesthesia department.  QUESTIONS?:  Please feel free to call your physician or the hospital operator if you have any questions, and they will be happy to assist you.     [facility] Anesthesia Department [address] [place] [phone]

## 2011-05-02 DIAGNOSIS — R109 Unspecified abdominal pain: Secondary | ICD-10-CM

## 2011-05-02 DIAGNOSIS — K5730 Diverticulosis of large intestine without perforation or abscess without bleeding: Secondary | ICD-10-CM

## 2011-05-04 ENCOUNTER — Encounter (HOSPITAL_COMMUNITY)
Admission: RE | Disposition: A | Discharge status: Home / Self Care | Payer: Self-pay | Source: Ambulatory Visit | Attending: Internal Medicine

## 2011-05-04 ENCOUNTER — Encounter (HOSPITAL_COMMUNITY): Payer: Self-pay | Admitting: Anesthesiology

## 2011-05-04 ENCOUNTER — Ambulatory Visit (HOSPITAL_COMMUNITY)
Admission: RE | Admit: 2011-05-04 | Discharge: 2011-05-04 | Disposition: A | Discharge status: Home / Self Care | Payer: Medicaid Other | Source: Ambulatory Visit | Attending: Internal Medicine | Admitting: Internal Medicine

## 2011-05-04 ENCOUNTER — Encounter (HOSPITAL_COMMUNITY): Payer: Self-pay | Admitting: *Deleted

## 2011-05-04 ENCOUNTER — Ambulatory Visit (HOSPITAL_COMMUNITY): Payer: Medicaid Other | Admitting: Anesthesiology

## 2011-05-04 ENCOUNTER — Other Ambulatory Visit: Payer: Self-pay | Admitting: Internal Medicine

## 2011-05-04 DIAGNOSIS — I10 Essential (primary) hypertension: Secondary | ICD-10-CM | POA: Insufficient documentation

## 2011-05-04 DIAGNOSIS — Z01810 Encounter for preprocedural cardiovascular examination: Secondary | ICD-10-CM | POA: Insufficient documentation

## 2011-05-04 DIAGNOSIS — K5730 Diverticulosis of large intestine without perforation or abscess without bleeding: Secondary | ICD-10-CM | POA: Insufficient documentation

## 2011-05-04 DIAGNOSIS — K219 Gastro-esophageal reflux disease without esophagitis: Secondary | ICD-10-CM | POA: Insufficient documentation

## 2011-05-04 DIAGNOSIS — Z79899 Other long term (current) drug therapy: Secondary | ICD-10-CM | POA: Insufficient documentation

## 2011-05-04 DIAGNOSIS — R1012 Left upper quadrant pain: Secondary | ICD-10-CM | POA: Insufficient documentation

## 2011-05-04 DIAGNOSIS — R112 Nausea with vomiting, unspecified: Secondary | ICD-10-CM | POA: Insufficient documentation

## 2011-05-04 DIAGNOSIS — E119 Type 2 diabetes mellitus without complications: Secondary | ICD-10-CM | POA: Insufficient documentation

## 2011-05-04 DIAGNOSIS — Z9283 Personal history of failed moderate sedation: Secondary | ICD-10-CM | POA: Insufficient documentation

## 2011-05-04 DIAGNOSIS — Z01812 Encounter for preprocedural laboratory examination: Secondary | ICD-10-CM | POA: Insufficient documentation

## 2011-05-04 HISTORY — PX: ESOPHAGOGASTRODUODENOSCOPY: SHX1529

## 2011-05-04 HISTORY — PX: COLONOSCOPY: SHX174

## 2011-05-04 LAB — GLUCOSE, CAPILLARY: Glucose-Capillary: 237 mg/dL — ABNORMAL HIGH (ref 70–99)

## 2011-05-04 SURGERY — COLONOSCOPY WITH PROPOFOL
Anesthesia: Monitor Anesthesia Care | Site: Rectum

## 2011-05-04 MED ORDER — GLYCOPYRROLATE 0.2 MG/ML IJ SOLN
0.2000 mg | Freq: Once | INTRAMUSCULAR | Status: AC
  Administered 2011-05-04: 0.2 mg via INTRAVENOUS

## 2011-05-04 MED ORDER — STERILE WATER FOR IRRIGATION IR SOLN
Status: DC | PRN
  Administered 2011-05-04: 09:00:00

## 2011-05-04 MED ORDER — LIDOCAINE HCL 1 % IJ SOLN
INTRAMUSCULAR | Status: DC | PRN
  Administered 2011-05-04: 30 mg via INTRADERMAL

## 2011-05-04 MED ORDER — LACTATED RINGERS IV SOLN
INTRAVENOUS | Status: DC
  Administered 2011-05-04: 1000 mL via INTRAVENOUS

## 2011-05-04 MED ORDER — MIDAZOLAM HCL 2 MG/2ML IJ SOLN
1.0000 mg | INTRAMUSCULAR | Status: DC | PRN
  Administered 2011-05-04: 2 mg via INTRAVENOUS

## 2011-05-04 MED ORDER — ONDANSETRON HCL 4 MG/2ML IJ SOLN
4.0000 mg | Freq: Once | INTRAMUSCULAR | Status: AC
  Administered 2011-05-04: 4 mg via INTRAVENOUS

## 2011-05-04 MED ORDER — FENTANYL CITRATE 0.05 MG/ML IJ SOLN: 25.0000 ug | INTRAMUSCULAR | Status: DC | PRN

## 2011-05-04 MED ORDER — MIDAZOLAM HCL 5 MG/5ML IJ SOLN
INTRAMUSCULAR | Status: DC | PRN
  Administered 2011-05-04 (×2): 2 mg via INTRAVENOUS

## 2011-05-04 MED ORDER — GLYCOPYRROLATE 0.2 MG/ML IJ SOLN
INTRAMUSCULAR | Status: AC
  Filled 2011-05-04: qty 1

## 2011-05-04 MED ORDER — SIMETHICONE LIQD: Status: DC | PRN

## 2011-05-04 MED ORDER — PANTOPRAZOLE SODIUM 40 MG PO TBEC: 40.0000 mg | DELAYED_RELEASE_TABLET | Freq: Every day | ORAL | Status: DC

## 2011-05-04 MED ORDER — ONDANSETRON HCL 4 MG/2ML IJ SOLN: 4.0000 mg | Freq: Once | INTRAMUSCULAR | Status: DC | PRN

## 2011-05-04 MED ORDER — PROPOFOL 10 MG/ML IV EMUL
INTRAVENOUS | Status: DC | PRN
  Administered 2011-05-04: 75 ug/kg/min via INTRAVENOUS

## 2011-05-04 MED ORDER — MIDAZOLAM HCL 2 MG/2ML IJ SOLN
INTRAMUSCULAR | Status: AC
  Administered 2011-05-04: 2 mg via INTRAVENOUS
  Filled 2011-05-04: qty 2

## 2011-05-04 MED ORDER — MIDAZOLAM HCL 2 MG/2ML IJ SOLN
INTRAMUSCULAR | Status: AC
  Filled 2011-05-04: qty 2

## 2011-05-04 MED ORDER — PROPOFOL 10 MG/ML IV EMUL
INTRAVENOUS | Status: AC
  Filled 2011-05-04: qty 20

## 2011-05-04 MED ORDER — ONDANSETRON HCL 4 MG/2ML IJ SOLN
INTRAMUSCULAR | Status: AC
  Administered 2011-05-04: 4 mg via INTRAVENOUS
  Filled 2011-05-04: qty 2

## 2011-05-04 MED ORDER — WATER FOR IRRIGATION, STERILE IR SOLN
Status: DC | PRN
  Administered 2011-05-04: 1000 mL via OROMUCOSAL

## 2011-05-04 SURGICAL SUPPLY — 26 items
BLOCK BITE 60FR ADLT L/F BLUE (MISCELLANEOUS) ×3 IMPLANT
DEVICE CLIP HEMOSTAT 235CM (CLIP) IMPLANT
ELECT REM PT RETURN 9FT ADLT (ELECTROSURGICAL)
ELECTRODE REM PT RTRN 9FT ADLT (ELECTROSURGICAL) IMPLANT
FCP BXJMBJMB 240X2.8X (CUTTING FORCEPS)
FLOOR PAD 36X40 (MISCELLANEOUS) ×3
FORCEP RJ3 GP 1.8X160 W-NEEDLE (CUTTING FORCEPS) ×3 IMPLANT
FORCEPS BIOP RAD 4 LRG CAP 4 (CUTTING FORCEPS) ×3 IMPLANT
FORCEPS BIOP RJ4 240 W/NDL (CUTTING FORCEPS)
FORCEPS BXJMBJMB 240X2.8X (CUTTING FORCEPS) IMPLANT
INJECTOR/SNARE I SNARE (MISCELLANEOUS) IMPLANT
LUBRICANT JELLY 4.5OZ STERILE (MISCELLANEOUS) IMPLANT
MANIFOLD NEPTUNE II (INSTRUMENTS) IMPLANT
MANIFOLD NEPTUNE WASTE (CANNULA) ×3 IMPLANT
NEEDLE SCLEROTHERAPY 25GX240 (NEEDLE) ×3 IMPLANT
PAD FLOOR 36X40 (MISCELLANEOUS) ×2 IMPLANT
PROBE APC STR FIRE (PROBE) ×3 IMPLANT
PROBE INJECTION GOLD (MISCELLANEOUS) ×1
PROBE INJECTION GOLD 7FR (MISCELLANEOUS) ×2 IMPLANT
SNARE ROTATE MED OVAL 20MM (MISCELLANEOUS) ×3 IMPLANT
SYR 50ML LL SCALE MARK (SYRINGE) IMPLANT
TRAP SPECIMEN MUCOUS 40CC (MISCELLANEOUS) IMPLANT
TUBING ENDO SMARTCAP (MISCELLANEOUS) ×3 IMPLANT
TUBING ENDO SMARTCAP PENTAX (MISCELLANEOUS) ×3 IMPLANT
TUBING IRRIGATION ENDOGATOR (MISCELLANEOUS) ×3 IMPLANT
WATER STERILE IRR 1000ML POUR (IV SOLUTION) IMPLANT

## 2011-05-04 NOTE — Anesthesia Postprocedure Evaluation (Signed)
  Anesthesia Post-op Note  Patient: Megan [name]  Procedure(s) Performed:  COLONOSCOPY WITH PROPOFOL - start time 0909  in cecum @ 0915,withdrawal @0921 ; ESOPHAGOGASTRODUODENOSCOPY (EGD) WITH PROPOFOL - ended at 0906  Patient Location: PACU  Anesthesia Type: MAC  Level of Consciousness: awake, alert , oriented and patient cooperative  Airway and Oxygen Therapy: Patient Spontanous Breathing  Post-op Pain: none  Post-op Assessment: Post-op Vital signs reviewed, Patient's Cardiovascular Status Stable, Respiratory Function Stable, Patent Airway and No signs of Nausea or vomiting  Post-op Vital Signs: Reviewed and stable  Complications: No apparent anesthesia complications

## 2011-05-04 NOTE — H&P (Addendum)
Tana Coast, PA 04/05/2011 1:48 PM Signed  Primary Care Physician: Milana Obey, MD  Primary Gastroenterologist: Roetta Sessions, MD  Chief Complaint   Patient presents with   .  Abdominal Pain    HPI: Megan [name] is a 59 y.o. female here for furthe evaluation of LUQ pain for past two to three months. Took antibiotics twice and pain medications but pain continues to come back. Starting Cipro/Flagy today for ?diverticulitis. CT A/P in 11/2010 failed to show cause of pain. Last colonoscopy by Dr. Jena Gauss over ten years ago per patient. Had two polyps. FH very significant for numerous members with colon cancer at young age.  BM were irregular up until 3 weeks ago. Put on strict DM diet, now BM regular. Few days of watery stools. No melena, brbpr. No fever. Some intermittent vomiting, about two times per week. Stays nauseated. DM for over ten years. No heartburn. No dysphagia. Weight stable. Nausea happens pp. Not as bad when eats real light. Room spins, nausea gets worse. Better when lays down. No hematemesis. No melena. Had labs done.  Current Outpatient Prescriptions   Medication  Sig  Dispense  Refill   .  ciprofloxacin (CIPRO) 500 MG tablet  Take 500 mg by mouth 2 (two) times daily.     .  diazepam (VALIUM) 10 MG tablet  Take 10 mg by mouth at bedtime as needed.     .  enalapril (VASOTEC) 20 MG tablet  Take 20 mg by mouth daily.     Marland Kitchen  glipiZIDE (GLUCOTROL) 10 MG tablet  Take 10 mg by mouth 1 day or 1 dose. In the AM     .  hydrochlorothiazide (HYDRODIURIL) 25 MG tablet  Take 25 mg by mouth daily.     Marland Kitchen  HYDROcodone-acetaminophen (VICODIN) 5-500 MG per tablet  Take 1 tablet by mouth every 4 (four) hours as needed.     .  metFORMIN (GLUMETZA) 500 MG (MOD) 24 hr tablet  Take 500 mg by mouth daily with breakfast. Take two tablets twice daily     .  metoprolol (TOPROL-XL) 50 MG 24 hr tablet  Take 50 mg by mouth daily.     .  metroNIDAZOLE (FLAGYL) 500 MG tablet  Take 500 mg by mouth 3  (three) times daily.     .  traZODone (DESYREL) 100 MG tablet  Take 100 mg by mouth at bedtime.      Allergies as of 04/05/2011 - Review Complete 04/05/2011   Allergen  Reaction  Noted   .  Naproxen   04/05/2011    Past Medical History   Diagnosis  Date   .  HTN (hypertension)    .  DM (diabetes mellitus)    .  Anxiety    .  Depression    .  Obesity    .  Colon polyps     Past Surgical History   Procedure  Date   .  Cardiac catheterization  2004     normal   .  Cholecystectomy    .  S/p hysterectomy      complete    Family History   Problem  Relation  Age of Onset   .  Colon cancer  Paternal Aunt       2, colon cancer    .  Colon cancer  Paternal Uncle       6, colon cancer    .  Colon cancer  Father  deceased, age 70    .  Colon cancer        numerous cousins died before age 22 with colon cancer    .  Colon cancer  Sister       deceased, age 61    History    Social History   .  Marital Status:  Married     Spouse Name:  N/A     Number of Children:  3   .  Years of Education:  N/A    Occupational History   .  disability     Social History Main Topics   .  Smoking status:  Current Some Day Smoker   .  Smokeless tobacco:  Not on file     Comment: one pack per month, never more than 1-2 cig/day in entire life    .  Alcohol Use:  No   .  Drug Use:  No   .  Sexually Active:  Not on file    Other Topics  Concern   .  Not on file    Social History Narrative    No contact with children since they left home at age 68. Doesn't know where they are.    ROS:  General: Negative for anorexia, weight loss, fever, chills, fatigue, weakness.  Eyes: Negative for vision changes.  ENT: Negative for hoarseness, difficulty swallowing , nasal congestion.  CV: Negative for chest pain, angina, palpitations, dyspnea on exertion, peripheral edema.  Respiratory: Negative for dyspnea at rest, dyspnea on exertion, cough, sputum, wheezing.  GI: See history of  present illness.  GU: Negative for dysuria, hematuria, urinary incontinence, urinary frequency, nocturnal urination.  MS: Negative for joint pain, low back pain.  Derm: Negative for rash or itching.  Neuro: Negative for weakness, abnormal sensation, seizure, frequent headaches, memory loss, confusion.  Psych: Positive for anxiety, depression. Negative for suicidal ideation, hallucinations.  Endo: Negative for unusual weight change.  Heme: Negative for bruising or bleeding.  Allergy: Negative for rash or hives.  Physical Examination:  BP 158/92  Pulse 76  Temp(Src) 97.1 F (36.2 C) (Temporal)  Ht 5\' 2"  (1.575 m)  Wt 204 lb 3.2 oz (92.625 kg)  BMI 37.35 kg/m2  General: Well-nourished, well-developed in no acute distress. Obese.  Head: Normocephalic, atraumatic.  Eyes: Conjunctiva pink, no icterus.  Mouth: Oropharyngeal mucosa moist and pink , no lesions erythema or exudate. Edentulous.  Neck: Supple without thyromegaly, masses, or lymphadenopathy.  Lungs: Clear to auscultation bilaterally.  Heart: Regular rate and rhythm, no murmurs rubs or gallops.  Abdomen: Bowel sounds are normal, nondistended, no hepatosplenomegaly or masses, no abdominal bruits or hernia , no rebound or guarding. Mild left sided abd tenderness with light palpation. No focal tenderness.  Rectal: Defer to time of colonoscopy.  Extremities: No lower extremity edema. No clubbing or deformities.  Neuro: Alert and oriented x 4 , grossly normal neurologically.  Skin: Warm and dry, no rash or jaundice.  Psych: Alert and cooperative, normal mood and affect.  Labs: OLD LABS FROM 2011  Lab Results   Component  Value  Date    WBC  7.5  12/12/2009    HGB  11.8*  12/12/2009    HCT  34.7*  12/12/2009    MCV  92.5  12/12/2009    PLT  220  12/12/2009    Lab Results   Component  Value  Date    ALT  18  12/12/2009    AST  19  12/12/2009    ALKPHOS  76  12/12/2009    BILITOT  0.5  12/12/2009    Lab Results   Component  Value   Date    CREATININE  0.82  12/12/2009    BUN  17  12/12/2009    NA  133*  12/12/2009    K  4.2  12/12/2009    CL  102  12/12/2009    CO2  25  12/12/2009    Imaging Studies:  CT A/P With CM (12/2010)  IMPRESSION:  No acute abdominal/pelvic findings, mass lesions or adenopathy   Leigh A Watson 04/05/2011 2:01 PM Signed  Cc to PCP Left sided abdominal pain - Tana Coast, PA 04/05/2011 1:46 PM Signed  Several month h/o persistent left-sided abdominal pain. Per patient, she has had several rounds of antibiotics for ?diverticulitis. Nothing to explain pain on CT A/P 11/2010. Doubt recurrent or persistent diverticulitis given multiple rounds of antibiotics. She will complete current round of cipro/flagyl. We will get records of labs from Dr. Sudie Bailey. Plan for EGD/colonoscopy in 3 weeks for further evaluation. I have discussed the risks, alternatives, benefits with regards to but not limited to the risk of reaction to medication, bleeding, infection, perforation and the patient is agreeable to proceed. Written consent to be obtained.  Procedure to be done with deep sedation in OR as patient describes having inadequate conscious sedation before and for polypharmacy.  FH: colon cancer - Tana Coast, PA 04/05/2011 1:47 PM Signed  Numerous family members with reported colon cancer at young age. Due for high risk screen. TCS to be done with deep sedation in OR given polypharmacy and inadequate conscious sedation. I have discussed the risks, alternatives, benefits with regards to but not limited to the risk of reaction to medication, bleeding, infection, perforation and the patient is agreeable to proceed. Written consent to be obtained.   N&V (nausea and vomiting) - Tana Coast, PA 04/05/2011 1:48 PM Signed  EGD as planned. ?PUD vs gastroparesis. Obtain records from PCP. I have seen & examined the patient prior to the procedure(s) today and reviewed the history and physical/consultation.   Patient states the pain  in her abdomen feels like an "eggg" moving all around under her skin. Since taking her colon prep, the "egg." has gone away. She denies constipation. States she has a bowel movement every day. CT as outlined above. Nausea and vomiting has also settled down.   Otherwise, there have been no changes.  After consideration of the risks, benefits, alternatives and imponderables, the patient has consented to the procedure(s).

## 2011-05-04 NOTE — Progress Notes (Signed)
Pt states she had full gallon prep, fleets enema this am. States bowels are clear.

## 2011-05-04 NOTE — Anesthesia Preprocedure Evaluation (Addendum)
Anesthesia Evaluation  Name, MR# and DOB Patient awake  General Assessment Comment  Reviewed: Allergy & Precautions, H&P , NPO status , Patient's Chart, lab work & pertinent test results, reviewed documented beta blocker date and time   History of Anesthesia Complications (+) PONV  Airway Mallampati: III TM Distance: <3 FB Neck ROM: Full    Dental  (+) Edentulous Upper and Edentulous Lower   Pulmonary    Pulmonary exam normal       Cardiovascular hypertension (forgot beta, will take post procedure), Pt. on medications and Pt. on home beta blockers Regular Normal    Neuro/Psych PSYCHIATRIC DISORDERS Anxiety Depression    GI/Hepatic   Endo/Other  Diabetes mellitus-, Poorly Controlled, Type 2, Oral Hypoglycemic Agents  Renal/GU      Musculoskeletal   Abdominal   Peds  Hematology   Anesthesia Other Findings   Reproductive/Obstetrics                           Anesthesia Physical Anesthesia Plan  ASA: III  Anesthesia Plan: MAC   Post-op Pain Management:    Induction:   Airway Management Planned: Simple Face Mask  Additional Equipment:   Intra-op Plan:   Post-operative Plan:   Informed Consent: I have reviewed the patients History and Physical, chart, labs and discussed the procedure including the risks, benefits and alternatives for the proposed anesthesia with the patient or authorized representative who has indicated his/her understanding and acceptance.     Plan Discussed with:   Anesthesia Plan Comments:         Anesthesia Quick Evaluation

## 2011-05-04 NOTE — Transfer of Care (Signed)
Immediate Anesthesia Transfer of Care Note  Patient: Megan [name]  Procedure(s) Performed:  COLONOSCOPY WITH PROPOFOL - start time 0909  in cecum @ 0915,withdrawal @0921 ; ESOPHAGOGASTRODUODENOSCOPY (EGD) WITH PROPOFOL - ended at 0906  Patient Location: PACU  Anesthesia Type: MAC  Level of Consciousness: awake, alert , oriented and patient cooperative  Airway & Oxygen Therapy: Patient Spontanous Breathing and Patient connected to face mask oxygen  Post-op Assessment: Report given to PACU RN, Post -op Vital signs reviewed and stable and Patient moving all extremities  Post vital signs: Reviewed and stable  Complications: No apparent anesthesia complications

## 2011-05-10 ENCOUNTER — Encounter: Payer: Self-pay | Admitting: Gastroenterology

## 2011-05-15 ENCOUNTER — Encounter: Payer: Self-pay | Admitting: Internal Medicine

## 2011-05-31 ENCOUNTER — Encounter: Payer: Self-pay | Admitting: Internal Medicine

## 2011-06-01 ENCOUNTER — Ambulatory Visit: Payer: Medicaid Other | Admitting: Gastroenterology

## 2011-06-12 ENCOUNTER — Ambulatory Visit: Payer: Medicaid Other | Admitting: Gastroenterology

## 2011-06-14 ENCOUNTER — Encounter: Payer: Self-pay | Admitting: Gastroenterology

## 2011-06-14 ENCOUNTER — Ambulatory Visit (INDEPENDENT_AMBULATORY_CARE_PROVIDER_SITE_OTHER): Payer: Medicaid Other | Admitting: Gastroenterology

## 2011-06-14 VITALS — BP 156/81 | HR 60 | Temp 97.7°F | Ht 62.0 in | Wt 201.2 lb

## 2011-06-14 DIAGNOSIS — K219 Gastro-esophageal reflux disease without esophagitis: Secondary | ICD-10-CM | POA: Insufficient documentation

## 2011-06-14 DIAGNOSIS — Z800 Family history of malignant neoplasm of digestive organs: Secondary | ICD-10-CM

## 2011-06-14 DIAGNOSIS — R109 Unspecified abdominal pain: Secondary | ICD-10-CM

## 2011-06-14 MED ORDER — PANTOPRAZOLE SODIUM 40 MG PO TBEC: 40.0000 mg | DELAYED_RELEASE_TABLET | Freq: Every day | ORAL | Status: DC

## 2011-06-14 NOTE — Progress Notes (Signed)
Primary Care Physician: Milana Obey, MD  Primary Gastroenterologist:  Roetta Sessions, MD   Chief Complaint  Patient presents with  . Follow-up    HPI: Megan [name] is a 60 y.o. female here for followup of recent EGD and colonoscopy. She had changes of acid reflux in the distal esophagus, extensive left-sided diverticulosis.  She has a history of left upper quadrant/left mid abdominal pain which began back in June. She denies having pain like this prior to June. She's been treated with antibiotics on multiple occasions for questionable diverticulitis although this has never been confirmed. CT scan back in June for failed to show a cause of her pain.  BM twice daily. Not taking Miralax. Is not taking the pantoprazole, states she did not know that Dr. Jena Gauss sent in a prescription after her EGD. Weight fluctuates from 200-215. Left-sided abdominal pain, stabbing. Burning pain. Location of the pain is the size of 1/2 dollar coin. Pain present all the time since 12/2010. Intermittent severe pain. No fever. No vomiting. No brbpr. Stools are soft and break-up in the water. No heartburn.     Current Outpatient Prescriptions  Medication Sig Dispense Refill  . ciprofloxacin (CIPRO) 500 MG tablet Take 500 mg by mouth 2 (two) times daily.       . diazepam (VALIUM) 10 MG tablet Take 10 mg by mouth at bedtime as needed.        . enalapril (VASOTEC) 20 MG tablet Take 20 mg by mouth daily.        Marland Kitchen glipiZIDE (GLUCOTROL) 10 MG tablet Take 10 mg by mouth every morning. In the AM      . hydrochlorothiazide (HYDRODIURIL) 25 MG tablet Take 25 mg by mouth daily.        Marland Kitchen HYDROcodone-acetaminophen (VICODIN) 5-500 MG per tablet Take 1 tablet by mouth every 4 (four) hours as needed. Back pain      . metFORMIN (GLUCOPHAGE) 500 MG tablet Take 1,000 mg by mouth 2 (two) times daily with a meal.        . metFORMIN (GLUMETZA) 500 MG (MOD) 24 hr tablet Take 500 mg by mouth daily with breakfast. Take two tablets  twice daily       . metoprolol (TOPROL-XL) 50 MG 24 hr tablet Take 50 mg by mouth daily.       . metoprolol (TOPROL-XL) 50 MG 24 hr tablet Take 50 mg by mouth daily.        . metroNIDAZOLE (FLAGYL) 500 MG tablet Take 500 mg by mouth 3 (three) times daily.        . pantoprazole (PROTONIX) 40 MG tablet Take 1 tablet (40 mg total) by mouth daily.  30 tablet  11  . traZODone (DESYREL) 100 MG tablet Take 100 mg by mouth at bedtime.          Allergies as of 06/14/2011 - Review Complete 06/14/2011  Allergen Reaction Noted  . Naproxen  04/05/2011    ROS:  General: Negative for anorexia, weight loss, fever, chills, fatigue, weakness. ENT: Negative for hoarseness, difficulty swallowing , nasal congestion. CV: Negative for chest pain, angina, palpitations, dyspnea on exertion, peripheral edema.  Respiratory: Negative for dyspnea at rest, dyspnea on exertion, cough, sputum, wheezing.  GI: See history of present illness. GU:  Negative for dysuria, hematuria, urinary incontinence, urinary frequency, nocturnal urination.  Endo: Negative for unusual weight change.    Physical Examination:   BP 156/81  Pulse 60  Temp(Src) 97.7 F (36.5 C) (Temporal)  Ht 5\' 2"  (1.575 m)  Wt 201 lb 3.2 oz (91.264 kg)  BMI 36.80 kg/m2  General: Well-nourished, well-developed in no acute distress.  Eyes: No icterus. Mouth: Oropharyngeal mucosa moist and pink , no lesions erythema or exudate. Lungs: Clear to auscultation bilaterally.  Heart: Regular rate and rhythm, no murmurs rubs or gallops.  Abdomen: Bowel sounds are normal, mild left mid-abdominal tenderness, nondistended, no hepatosplenomegaly or masses, no abdominal bruits or hernia , no rebound or guarding.   Extremities: No lower extremity edema. No clubbing or deformities. Neuro: Alert and oriented x 4   Skin: Warm and dry, no jaundice.   Psych: Alert and cooperative, normal mood and affect.

## 2011-06-14 NOTE — Patient Instructions (Signed)
Please go to [facility] and pick up RX for pantoprazole. This is to heal the erosions in your esophagus. Please take for next three months. You may stop at that time but if you have recurrent vomiting, heartburn you should take the medication forever.  I will discuss your abdominal pain with Dr. Jena Gauss and get back in touch with you.

## 2011-06-15 ENCOUNTER — Encounter: Payer: Self-pay | Admitting: Gastroenterology

## 2011-06-15 NOTE — Progress Notes (Signed)
Cc to PCP 

## 2011-06-15 NOTE — Assessment & Plan Note (Signed)
Personal h/o polyps. FH with numerous members reportedly with colon cancer. Consider next TCS in five years.

## 2011-06-15 NOTE — Assessment & Plan Note (Signed)
Begin pantoprazole. Complete at least two month course. At that time, she may try coming off medicine if she has no recurrent vomiting, heartburn, etc.

## 2011-06-15 NOTE — Assessment & Plan Note (Signed)
Several month history of persistent left midabdominal pain. Treated empirically for diverticulitis without relief on multiple occasions. CT scan in June did not show a cause for her pain. Colonoscopy showed extensive left-sided diverticulosis. She had mild reflux esophagitis. She complains of persistent pain, sometimes worse than others. Etiology of abdominal pain not clear, to discuss further with Dr. Jena Gauss. Further recommendations to follow.

## 2011-06-20 NOTE — Progress Notes (Signed)
Reminder in epic to consider next tcs in 5 years

## 2011-07-19 ENCOUNTER — Telehealth: Payer: Self-pay | Admitting: Gastroenterology

## 2011-07-19 NOTE — Telephone Encounter (Signed)
Please call patient and find out how she is doing with regards to abdominal pain.

## 2011-07-19 NOTE — Telephone Encounter (Signed)
Tried to call pt- LMOM 

## 2011-07-20 NOTE — Telephone Encounter (Signed)
I would like for her to add fiber supplement such as Benefiber or Fiberchoice once daily. Some of her pain likely due to diverticulosis. Need to keep stool moving.   OV in two months.   Tell her I asked RMR about when her next TCS would be and he said five years.   Please NIC for TCS in 04/2016.

## 2011-07-20 NOTE — Telephone Encounter (Signed)
Reminder in epic to have tcs in 04/2016

## 2011-07-20 NOTE — Telephone Encounter (Signed)
Pt aware, please schedule ov in 2 months and nic tcs in 5 years.

## 2011-07-20 NOTE — Telephone Encounter (Signed)
Spoke with pt- she said the pain in her stomach was still there, it comes and goes, but it wasn't as bad as it was before. She thinks the medication is helping. She said she feels like what she eats seems to make it better or worse as well. No N/V, No fever.

## 2011-07-24 ENCOUNTER — Encounter: Payer: Self-pay | Admitting: Gastroenterology

## 2011-09-21 ENCOUNTER — Ambulatory Visit: Payer: Medicaid Other | Admitting: Gastroenterology

## 2011-09-26 ENCOUNTER — Ambulatory Visit: Payer: Medicaid Other | Admitting: Gastroenterology

## 2011-10-11 ENCOUNTER — Emergency Department (HOSPITAL_COMMUNITY)
Admission: EM | Admit: 2011-10-11 | Discharge: 2011-10-11 | Disposition: A | Discharge status: Home / Self Care | Payer: Medicaid Other | Attending: Emergency Medicine | Admitting: Emergency Medicine

## 2011-10-11 ENCOUNTER — Encounter (HOSPITAL_COMMUNITY): Payer: Self-pay

## 2011-10-11 ENCOUNTER — Emergency Department (HOSPITAL_COMMUNITY): Payer: Medicaid Other

## 2011-10-11 DIAGNOSIS — I10 Essential (primary) hypertension: Secondary | ICD-10-CM | POA: Insufficient documentation

## 2011-10-11 DIAGNOSIS — IMO0002 Reserved for concepts with insufficient information to code with codable children: Secondary | ICD-10-CM | POA: Insufficient documentation

## 2011-10-11 DIAGNOSIS — W19XXXA Unspecified fall, initial encounter: Secondary | ICD-10-CM

## 2011-10-11 DIAGNOSIS — S022XXA Fracture of nasal bones, initial encounter for closed fracture: Secondary | ICD-10-CM | POA: Insufficient documentation

## 2011-10-11 DIAGNOSIS — E119 Type 2 diabetes mellitus without complications: Secondary | ICD-10-CM | POA: Insufficient documentation

## 2011-10-11 DIAGNOSIS — W010XXA Fall on same level from slipping, tripping and stumbling without subsequent striking against object, initial encounter: Secondary | ICD-10-CM | POA: Insufficient documentation

## 2011-10-11 DIAGNOSIS — R42 Dizziness and giddiness: Secondary | ICD-10-CM | POA: Insufficient documentation

## 2011-10-11 DIAGNOSIS — Z79899 Other long term (current) drug therapy: Secondary | ICD-10-CM | POA: Insufficient documentation

## 2011-10-11 DIAGNOSIS — F341 Dysthymic disorder: Secondary | ICD-10-CM | POA: Insufficient documentation

## 2011-10-11 DIAGNOSIS — R040 Epistaxis: Secondary | ICD-10-CM | POA: Insufficient documentation

## 2011-10-11 MED ORDER — HYDROMORPHONE HCL PF 1 MG/ML IJ SOLN
1.0000 mg | Freq: Once | INTRAMUSCULAR | Status: AC
  Administered 2011-10-11: 1 mg via INTRAMUSCULAR
  Filled 2011-10-11: qty 1

## 2011-10-11 NOTE — ED Notes (Signed)
Pt reports bent over to pick up the mail she dropped and fell over onto rock driveway.  Pt denies any LOC.  Nose bled from both nostrils per pt's family.  Bleeding controlled at present.  Denies any head, neck, or back pain.  Pt c/o dizziness.

## 2011-10-11 NOTE — ED Provider Notes (Signed)
History   This chart was scribed for Donnetta Hutching, MD by Brooks Sailors. The patient was seen in room APA09/APA09. Patient's care was started at 1158.   CSN: [id number]  Arrival date & time 10/11/11  1158   First MD Initiated Contact with Patient 10/11/11 1252      Chief Complaint  Patient presents with  . Fall    (Consider location/radiation/quality/duration/timing/severity/associated sxs/prior treatment) HPI  Megan [name] is a 61 y.o. female who presents to the Emergency Department complaining of constant mild to moderate epistaxis onset today after falling face first and striking face onto a rock driveway with associated dizziness. Patient was chasing a Administrator that was blowing in the wind when she bent over and then subsequently fell onto gravel.  Denies LOC head, neck, and back pain.    Past Medical History  Diagnosis Date  . HTN (hypertension)   . DM (diabetes mellitus)   . Anxiety   . Depression   . Obesity   . Colon polyps   . Complication of anesthesia   . PONV (postoperative nausea and vomiting)   . Agoraphobia with panic disorder     Past Surgical History  Procedure Date  . Cardiac catheterization 2004    normal  . S/p hysterectomy     complete  . Abdominal hysterectomy 15 yrs ago    with bso at aph  . Cholecystectomy 25 yrs ago    [facility]  . Colonoscopy 05/04/11    left-sided colonic diverticulosis  . Esophagogastroduodenoscopy 05/04/2011    Abnormal distal esophagus, biopsies consistent with inflammation due to acid reflux. No evidence of Barrett's. No H. pylori    Family History  Problem Relation Age of Onset  . Colon cancer Paternal Aunt     2, colon cancer  . Colon cancer Paternal Uncle     6, colon cancer  . Colon cancer Father     deceased, age 36  . Colon cancer      numerous cousins died before age 50 with colon cancer  . Colon cancer Sister     deceased, age 41  . Anesthesia problems Neg Hx   . Hypotension Neg Hx   .  Malignant hyperthermia Neg Hx   . Pseudochol deficiency Neg Hx     History  Substance Use Topics  . Smoking status: Current Some Day Smoker -- 0.2 packs/day for 15 years    Types: Cigarettes  . Smokeless tobacco: Not on file   Comment: one pack per month, never more than 1-2 cig/day in entire life  . Alcohol Use: No    OB History    Grav Para Term Preterm Abortions TAB SAB Ect Mult Living                  Review of Systems 10 Systems reviewed and are negative for acute change except as noted in the HPI.  Allergies  Naproxen  Home Medications   Current Outpatient Rx  Name Route Sig Dispense Refill  . DIAZEPAM 10 MG PO TABS Oral Take 10 mg by mouth at bedtime as needed. For sleep    . ENALAPRIL MALEATE 20 MG PO TABS Oral Take 20 mg by mouth daily.      Marland Kitchen GLIPIZIDE 10 MG PO TABS Oral Take 10 mg by mouth every morning. In the AM    . HYDROCHLOROTHIAZIDE 25 MG PO TABS Oral Take 25 mg by mouth daily.      Marland Kitchen HYDROCODONE-ACETAMINOPHEN 10-650 MG PO  TABS Oral Take 1 tablet by mouth 2 (two) times daily as needed. For pain    . METFORMIN HCL 500 MG PO TABS Oral Take 1,000 mg by mouth 2 (two) times daily with a meal.     . METOPROLOL SUCCINATE ER 50 MG PO TB24 Oral Take 50 mg by mouth daily.      . TRAZODONE HCL 100 MG PO TABS Oral Take 100 mg by mouth at bedtime.        BP 137/115  Pulse 59  Temp(Src) 97.9 F (36.6 C) (Oral)  Resp 21  Ht 5\' 2"  (1.575 m)  Wt 200 lb (90.719 kg)  BMI 36.58 kg/m2  SpO2 97%  Physical Exam  Nursing note and vitals reviewed. Constitutional: She is oriented to person, place, and time. She appears well-developed and well-nourished. No distress.  HENT:  Head: Normocephalic.       Blood, right nares. Less blood and clotted blood, left nares.  Eyes: EOM are normal. Pupils are equal, round, and reactive to light.  Neck: Neck supple. No tracheal deviation present.  Cardiovascular: Normal rate.   Pulmonary/Chest: Effort normal. No respiratory distress.   Abdominal: Soft. She exhibits no distension.  Musculoskeletal: Normal range of motion. She exhibits no edema.  Neurological: She is alert and oriented to person, place, and time. No sensory deficit.  Skin: Skin is warm and dry.       Small abrasion on right hand  Psychiatric: She has a normal mood and affect. Her behavior is normal.    ED Course  Procedures (including critical care time) DIAGNOSTIC STUDIES: Oxygen Saturation is 97% on room air, normal by my interpretation.    COORDINATION OF CARE: 12:55PM Patient given ice pack for face to control bleeding, shot for headache, ordering 1 CT of head, 1 CT maxilofacia.1 dilaudid IM for pain.    Labs Reviewed - No data to display Ct Head Wo Contrast  10/11/2011  *RADIOLOGY REPORT*  Clinical Data:  Fall striking nose on ground  CT HEAD WITHOUT CONTRAST CT MAXILLOFACIAL WITHOUT CONTRAST  Technique:  Multidetector CT imaging of the head and maxillofacial structures were performed using the standard protocol without intravenous contrast. Multiplanar CT image reconstructions of the maxillofacial structures were also generated.  Comparison:   None.  CT HEAD  Findings: Generalized atrophy. Normal ventricular morphology. No midline shift or mass effect. No intracranial hemorrhage, mass lesion or evidence of acute infarction. Atherosclerotic calcification of internal carotid arteries bilaterally at skull base. No calvarial fracture or extra-axial fluid collection identified.  IMPRESSION: No acute intracranial abnormalities.  CT MAXILLOFACIAL  Findings: Intraorbital soft tissue planes clear. Intracranial structures unremarkable. Bones diffusely demineralized. Tiny amount of dependent fluid within the sphenoid sinus. Tiny mucosal retention cyst versus polyp at roof of the left maxillary sinus. Remaining paranasal sinuses, mastoid air cells and middle ear cavities clear. Nasal septal deviation to the right. Minimally displaced distal left nasal bone fracture.  No additional fractures identified. Visualized portion of upper cervical spine is unremarkable.  IMPRESSION: Minimally displaced distal left nasal bone fracture.  Original Report Authenticated By: Lollie Marrow, M.D.   Ct Maxillofacial Wo Cm  10/11/2011  *RADIOLOGY REPORT*  Clinical Data:  Fall striking nose on ground  CT HEAD WITHOUT CONTRAST CT MAXILLOFACIAL WITHOUT CONTRAST  Technique:  Multidetector CT imaging of the head and maxillofacial structures were performed using the standard protocol without intravenous contrast. Multiplanar CT image reconstructions of the maxillofacial structures were also generated.  Comparison:   None.  CT HEAD  Findings: Generalized atrophy. Normal ventricular morphology. No midline shift or mass effect. No intracranial hemorrhage, mass lesion or evidence of acute infarction. Atherosclerotic calcification of internal carotid arteries bilaterally at skull base. No calvarial fracture or extra-axial fluid collection identified.  IMPRESSION: No acute intracranial abnormalities.  CT MAXILLOFACIAL  Findings: Intraorbital soft tissue planes clear. Intracranial structures unremarkable. Bones diffusely demineralized. Tiny amount of dependent fluid within the sphenoid sinus. Tiny mucosal retention cyst versus polyp at roof of the left maxillary sinus. Remaining paranasal sinuses, mastoid air cells and middle ear cavities clear. Nasal septal deviation to the right. Minimally displaced distal left nasal bone fracture. No additional fractures identified. Visualized portion of upper cervical spine is unremarkable.  IMPRESSION: Minimally displaced distal left nasal bone fracture.  Original Report Authenticated By: Lollie Marrow, M.D.     No diagnosis found.    MDM  Patient is alert and oriented x3. No neuro deficits. No cervical spine tenderness. CT scan reviewed and shows left nasal bone fracture. Bleeding has stopped from nose.      I personally performed the services described  in this documentation, which was scribed in my presence. The recorded information has been reviewed and considered.    Donnetta Hutching, MD 10/11/11 1444

## 2011-10-11 NOTE — ED Notes (Signed)
Ice pack applied to upper facial area /nose to help with bleeding. NAD

## 2011-10-11 NOTE — Discharge Instructions (Signed)
Nasal Fracture A nasal fracture is a break or crack in the bones of the nose. A minor break usually heals in a month. You often will receive black eyes from a nasal fracture. This is not a cause for concern. The black eyes will go away over 1 to 2 weeks.  DIAGNOSIS  Your caregiver may want to examine you if you are concerned about a fracture of the nose. X-rays of the nose may not show a nasal fracture even when one is present. Sometimes your caregiver must wait 1 to 5 days after the injury to re-check the nose for alignment and to take additional X-rays. Sometimes the caregiver must wait until the swelling has gone down. TREATMENT Minor fractures that have caused no deformity often do not require treatment. More serious fractures where bones are displaced may require surgery. This will take place after the swelling is gone. Surgery will stabilize and align the fracture. HOME CARE INSTRUCTIONS   Put ice on the injured area.   Put ice in a plastic bag.   Place a towel between your skin and the bag.   Leave the ice on for 15 to 20 minutes, 3 to 4 times a day.   Take medications as directed by your caregiver.   Only take over-the-counter or prescription medicines for pain, discomfort, or fever as directed by your caregiver.   If your nose starts bleeding, squeeze the soft parts of the nose against the center wall while you are sitting in an upright position for 10 minutes.   Contact sports should be avoided for at least 3 to 4 weeks or as directed by your caregiver.  SEEK MEDICAL CARE IF:  Your pain increases or becomes severe.   You continue to have nosebleeds.   The shape of your nose does not return to normal within 5 days.   You have pus draining from the nose.  SEEK IMMEDIATE MEDICAL CARE IF:   You have bleeding from your nose that does not stop after 20 minutes of pinching the nostrils closed and keeping ice on the nose.   You have clear fluid draining from your nose.   You  notice a grape-like swelling on the dividing wall between the nostrils (septum). This is a collection of blood (hematoma) that must be drained to help prevent infection.   You have difficulty moving your eyes.   You have recurrent vomiting.  Document Released: 06/30/2000 Document Revised: 06/22/2011 Document Reviewed: 10/17/2010 [facility] Patient Information 2012 [place], [place].  You have a fractured nasal bone. There is typically no good cure for this. Tylenol for pain. Ice pack to face. You may have some bleeding from nose but this is normal.  Sleep tonight with head of bed elevated with pillows.  followup with ear nose and throat doctor for further questions. Number given.

## 2013-05-06 ENCOUNTER — Emergency Department (HOSPITAL_COMMUNITY): Payer: Medicaid Other

## 2013-05-06 ENCOUNTER — Emergency Department (HOSPITAL_COMMUNITY)
Admission: EM | Admit: 2013-05-06 | Discharge: 2013-05-06 | Disposition: A | Discharge status: Home / Self Care | Payer: Medicaid Other | Attending: Emergency Medicine | Admitting: Emergency Medicine

## 2013-05-06 ENCOUNTER — Encounter (HOSPITAL_COMMUNITY): Payer: Self-pay | Admitting: Emergency Medicine

## 2013-05-06 DIAGNOSIS — R509 Fever, unspecified: Secondary | ICD-10-CM | POA: Insufficient documentation

## 2013-05-06 DIAGNOSIS — Z79899 Other long term (current) drug therapy: Secondary | ICD-10-CM | POA: Insufficient documentation

## 2013-05-06 DIAGNOSIS — Z860100 Personal history of colon polyps, unspecified: Secondary | ICD-10-CM | POA: Insufficient documentation

## 2013-05-06 DIAGNOSIS — F17200 Nicotine dependence, unspecified, uncomplicated: Secondary | ICD-10-CM | POA: Insufficient documentation

## 2013-05-06 DIAGNOSIS — F411 Generalized anxiety disorder: Secondary | ICD-10-CM | POA: Insufficient documentation

## 2013-05-06 DIAGNOSIS — Z9861 Coronary angioplasty status: Secondary | ICD-10-CM | POA: Insufficient documentation

## 2013-05-06 DIAGNOSIS — R070 Pain in throat: Secondary | ICD-10-CM | POA: Insufficient documentation

## 2013-05-06 DIAGNOSIS — E669 Obesity, unspecified: Secondary | ICD-10-CM | POA: Insufficient documentation

## 2013-05-06 DIAGNOSIS — F3289 Other specified depressive episodes: Secondary | ICD-10-CM | POA: Insufficient documentation

## 2013-05-06 DIAGNOSIS — E119 Type 2 diabetes mellitus without complications: Secondary | ICD-10-CM | POA: Insufficient documentation

## 2013-05-06 DIAGNOSIS — R6889 Other general symptoms and signs: Secondary | ICD-10-CM | POA: Insufficient documentation

## 2013-05-06 DIAGNOSIS — F329 Major depressive disorder, single episode, unspecified: Secondary | ICD-10-CM | POA: Insufficient documentation

## 2013-05-06 DIAGNOSIS — I10 Essential (primary) hypertension: Secondary | ICD-10-CM | POA: Insufficient documentation

## 2013-05-06 LAB — CBC WITH DIFFERENTIAL/PLATELET
Basophils Relative: 0 % (ref 0–1)
Eosinophils Absolute: 0.1 10*3/uL (ref 0.0–0.7)
MCH: 32.7 pg (ref 26.0–34.0)
MCHC: 34.4 g/dL (ref 30.0–36.0)
Monocytes Relative: 6 % (ref 3–12)
Neutrophils Relative %: 50 % (ref 43–77)
Platelets: 221 10*3/uL (ref 150–400)

## 2013-05-06 LAB — BASIC METABOLIC PANEL
BUN: 29 mg/dL — ABNORMAL HIGH (ref 6–23)
Calcium: 10.1 mg/dL (ref 8.4–10.5)
GFR calc Af Amer: 86 mL/min — ABNORMAL LOW (ref >90)
GFR calc non Af Amer: 75 mL/min — ABNORMAL LOW (ref >90)
Potassium: 3.8 mEq/L (ref 3.5–5.1)

## 2013-05-06 MED ORDER — OXYCODONE-ACETAMINOPHEN 5-325 MG PO TABS: 1.0000 | ORAL_TABLET | ORAL | Status: DC | PRN

## 2013-05-06 MED ORDER — MORPHINE SULFATE 4 MG/ML IJ SOLN
4.0000 mg | Freq: Once | INTRAMUSCULAR | Status: AC
  Administered 2013-05-06: 4 mg via INTRAVENOUS
  Filled 2013-05-06: qty 1

## 2013-05-06 MED ORDER — IOHEXOL 300 MG/ML  SOLN
75.0000 mL | Freq: Once | INTRAMUSCULAR | Status: AC | PRN
  Administered 2013-05-06: 75 mL via INTRAVENOUS

## 2013-05-06 MED ORDER — SODIUM CHLORIDE 0.9 % IJ SOLN
INTRAMUSCULAR | Status: AC
  Filled 2013-05-06: qty 500

## 2013-05-06 NOTE — ED Notes (Signed)
Patient states she feels like something is in the back of her throat; states this has been ongoing x 1 month.  Patient states that she has been on medicine from her doctor without relief.

## 2013-05-06 NOTE — ED Provider Notes (Signed)
CSN: [id number]     Arrival date & time 05/06/13  0228 History   First MD Initiated Contact with Patient 05/06/13 [phone]     Chief Complaint  Patient presents with  . Sore Throat   (Consider location/radiation/quality/duration/timing/severity/associated sxs/prior Treatment) Patient is a 62 y.o. female presenting with pharyngitis. The history is provided by the patient.  Sore Throat  She has been having pain in the right side of her throat for the last month. She initially saw her PCP who got a strep screen which was negative. She was given a course of prednisone and ciprofloxacin with some partial improvement. However, she then broke out in sores in her mouth and was given a course of meds at mouthwash which has resolved in sores. She continues to have pain in the right side of her throat. Pain is worse with swallowing. Nothing makes it better. She feels like there is something stuck there. She states the pain is severe although she is not able to put a number on it. She has been able to drink fluids and eat soft foods. She had subjective fever as well as chills and sweats. She denies any cough, dyspnea, nausea, vomiting, diarrhea. She denies arthralgias and myalgias.  Past Medical History  Diagnosis Date  . HTN (hypertension)   . DM (diabetes mellitus)   . Anxiety   . Depression   . Obesity   . Colon polyps   . Complication of anesthesia   . PONV (postoperative nausea and vomiting)   . Agoraphobia with panic disorder    Past Surgical History  Procedure Laterality Date  . Cardiac catheterization  2004    normal  . S/p hysterectomy      complete  . Abdominal hysterectomy  15 yrs ago    with bso at aph  . Cholecystectomy  25 yrs ago    [facility]  . Colonoscopy  05/04/11    left-sided colonic diverticulosis  . Esophagogastroduodenoscopy  05/04/2011    Abnormal distal esophagus, biopsies consistent with inflammation due to acid reflux. No evidence of Barrett's. No H. pylori   Family  History  Problem Relation Age of Onset  . Colon cancer Paternal Aunt     2, colon cancer  . Colon cancer Paternal Uncle     6, colon cancer  . Colon cancer Father     deceased, age 46  . Colon cancer      numerous cousins died before age 54 with colon cancer  . Colon cancer Sister     deceased, age 77  . Anesthesia problems Neg Hx   . Hypotension Neg Hx   . Malignant hyperthermia Neg Hx   . Pseudochol deficiency Neg Hx    History  Substance Use Topics  . Smoking status: Current Some Day Smoker -- 0.25 packs/day for 15 years    Types: Cigarettes  . Smokeless tobacco: Not on file     Comment: one pack per month, never more than 1-2 cig/day in entire life  . Alcohol Use: No   OB History   Grav Para Term Preterm Abortions TAB SAB Ect Mult Living                 Review of Systems  All other systems reviewed and are negative.    Allergies  Naproxen  Home Medications   Current Outpatient Rx  Name  Route  Sig  Dispense  Refill  . atorvastatin (LIPITOR) 20 MG tablet   Oral   Take 20  mg by mouth daily.         . diazepam (VALIUM) 10 MG tablet   Oral   Take 10 mg by mouth at bedtime as needed. For sleep         . enalapril (VASOTEC) 20 MG tablet   Oral   Take 20 mg by mouth 2 (two) times daily.          Marland Kitchen glipiZIDE (GLUCOTROL) 10 MG tablet   Oral   Take 10 mg by mouth every morning. In the AM         . hydrochlorothiazide (HYDRODIURIL) 25 MG tablet   Oral   Take 25 mg by mouth daily.           Marland Kitchen HYDROcodone-acetaminophen (LORCET) 10-650 MG per tablet   Oral   Take 1 tablet by mouth 2 (two) times daily as needed. For pain         . metFORMIN (GLUCOPHAGE) 500 MG tablet   Oral   Take 1,000 mg by mouth 2 (two) times daily with a meal.          . metoprolol (TOPROL-XL) 50 MG 24 hr tablet   Oral   Take 50 mg by mouth daily.           . pioglitazone (ACTOS) 15 MG tablet   Oral   Take 15 mg by mouth daily.         . traZODone (DESYREL) 100  MG tablet   Oral   Take 100 mg by mouth at bedtime.            BP 157/90  Pulse 66  Temp(Src) 97.8 F (36.6 C) (Oral)  Resp 18  Ht 5\' 2"  (1.575 m)  Wt 189 lb (85.73 kg)  BMI 34.56 kg/m2  SpO2 98% Physical Exam  Nursing note and vitals reviewed.  62 year old female, resting comfortably and in no acute distress. Vital signs are significant for hypertension with blood pressure 137/90. Oxygen saturation is 98%, which is normal. Head is normocephalic and atraumatic. PERRLA, EOMI. Oropharynx shows mild erythema without exudate. There is no poolling of secretions. Neck is nontender and supple without adenopathy or JVD. No masses palpable. Back is nontender and there is no CVA tenderness. Lungs are clear without rales, wheezes, or rhonchi. Chest is nontender. Heart has regular rate and rhythm without murmur. Abdomen is soft, flat, nontender without masses or hepatosplenomegaly and peristalsis is normoactive. Extremities have no cyanosis or edema, full range of motion is present. Skin is warm and dry without rash. Neurologic: Mental status is normal, cranial nerves are intact, there are no motor or sensory deficits.  ED Course  Procedures (including critical care time) Labs Review Results for orders placed during the hospital encounter of 05/06/13  CBC WITH DIFFERENTIAL      Result Value Range   WBC 9.3  4.0 - 10.5 K/uL   RBC 3.88  3.87 - 5.11 MIL/uL   Hemoglobin 12.7  12.0 - 15.0 g/dL   HCT 40.9  81.1 - 91.4 %   MCV 95.1  78.0 - 100.0 fL   MCH 32.7  26.0 - 34.0 pg   MCHC 34.4  30.0 - 36.0 g/dL   RDW 78.2  95.6 - 21.3 %   Platelets 221  150 - 400 K/uL   Neutrophils Relative % 50  43 - 77 %   Neutro Abs 4.7  1.7 - 7.7 K/uL   Lymphocytes Relative 43  12 - 46 %  Lymphs Abs 4.0  0.7 - 4.0 K/uL   Monocytes Relative 6  3 - 12 %   Monocytes Absolute 0.6  0.1 - 1.0 K/uL   Eosinophils Relative 1  0 - 5 %   Eosinophils Absolute 0.1  0.0 - 0.7 K/uL   Basophils Relative 0  0 - 1 %    Basophils Absolute 0.0  0.0 - 0.1 K/uL  BASIC METABOLIC PANEL      Result Value Range   Sodium 134 (*) 135 - 145 mEq/L   Potassium 3.8  3.5 - 5.1 mEq/L   Chloride 97  96 - 112 mEq/L   CO2 30  19 - 32 mEq/L   Glucose, Bld 135 (*) 70 - 99 mg/dL   BUN 29 (*) 6 - 23 mg/dL   Creatinine, Ser 1.61  0.50 - 1.10 mg/dL   Calcium 09.6  8.4 - 04.5 mg/dL   GFR calc non Af Amer 75 (*) >90 mL/min   GFR calc Af Amer 86 (*) >90 mL/min   Imaging Review Ct Soft Tissue Neck W Contrast  05/06/2013   *RADIOLOGY REPORT*  Clinical Data: Neck pain, foreign body sensation.  CT NECK WITH CONTRAST  Technique:  Multidetector CT imaging of the neck was performed with intravenous contrast. Coronal and sagittal reformations were viewed.  Contrast: 75mL OMNIPAQUE IOHEXOL 300 MG/ML  SOLN  Comparison: CT of the head October 11, 2011.  Findings: Aerodigestive tract is unremarkable, preservation of fair parapharyngeal fat tissue planes.  No free fluid, fluid collections or masses within the neck. No radiopaque foreign bodies.  No lymphadenopathy by CT size criteria.  Normal appearance of major salivary glands, and the thyroid gland.  Mild calcific atherosclerosis of the carotid bulb.  Retropharyngeal course of the cervical segment of the internal carotid arteries.  The patient is edentulous. Unerupted solitary right maxillary incisor.  Mild degenerative change of cervical spine.  Paranasal sinuses and mastoid air cells appear well aerated.  IMPRESSION: No radiopaque foreign bodies, or specific findings to explain dysphasia/foreign body sensation.  Patent airway.   Original Report Authenticated By: Awilda Metro    EKG Interpretation   None       MDM   1. Throat pain    Persistent right-sided throat pain with foreign body sensation and unremarkable exam. She is failed a course of antibiotics and steroids. She'll be sent for CT to make sure there is no mass lesion present and if negative, will be sent to ENT for further  outpatient workup.  CT and laboratory workup were unremarkable. She is discharged with prescription for oxycodone-acetaminophen for pain and is referred to ENT for further evaluation.    [name], MD 05/06/13 [phone]

## 2014-07-15 ENCOUNTER — Encounter: Payer: Self-pay | Admitting: Internal Medicine

## 2014-08-20 DIAGNOSIS — I639 Cerebral infarction, unspecified: Secondary | ICD-10-CM

## 2014-08-20 HISTORY — DX: Cerebral infarction, unspecified: I63.9

## 2015-03-08 ENCOUNTER — Emergency Department (HOSPITAL_COMMUNITY)
Admission: EM | Admit: 2015-03-08 | Discharge: 2015-03-08 | Disposition: A | Discharge status: Home / Self Care | Payer: Medicaid Other | Attending: Emergency Medicine | Admitting: Emergency Medicine

## 2015-03-08 ENCOUNTER — Encounter (HOSPITAL_COMMUNITY): Payer: Self-pay | Admitting: *Deleted

## 2015-03-08 ENCOUNTER — Emergency Department (HOSPITAL_COMMUNITY): Payer: Medicaid Other

## 2015-03-08 ENCOUNTER — Emergency Department (HOSPITAL_BASED_OUTPATIENT_CLINIC_OR_DEPARTMENT_OTHER): Payer: Medicaid Other

## 2015-03-08 DIAGNOSIS — Z87891 Personal history of nicotine dependence: Secondary | ICD-10-CM | POA: Diagnosis not present

## 2015-03-08 DIAGNOSIS — Z79899 Other long term (current) drug therapy: Secondary | ICD-10-CM | POA: Diagnosis not present

## 2015-03-08 DIAGNOSIS — R109 Unspecified abdominal pain: Secondary | ICD-10-CM

## 2015-03-08 DIAGNOSIS — I340 Nonrheumatic mitral (valve) insufficiency: Secondary | ICD-10-CM

## 2015-03-08 DIAGNOSIS — N280 Ischemia and infarction of kidney: Secondary | ICD-10-CM | POA: Insufficient documentation

## 2015-03-08 DIAGNOSIS — F329 Major depressive disorder, single episode, unspecified: Secondary | ICD-10-CM | POA: Diagnosis not present

## 2015-03-08 DIAGNOSIS — E119 Type 2 diabetes mellitus without complications: Secondary | ICD-10-CM | POA: Insufficient documentation

## 2015-03-08 DIAGNOSIS — Z9889 Other specified postprocedural states: Secondary | ICD-10-CM | POA: Diagnosis not present

## 2015-03-08 DIAGNOSIS — F419 Anxiety disorder, unspecified: Secondary | ICD-10-CM | POA: Insufficient documentation

## 2015-03-08 DIAGNOSIS — I10 Essential (primary) hypertension: Secondary | ICD-10-CM | POA: Insufficient documentation

## 2015-03-08 DIAGNOSIS — E669 Obesity, unspecified: Secondary | ICD-10-CM | POA: Diagnosis not present

## 2015-03-08 DIAGNOSIS — Z86010 Personal history of colonic polyps: Secondary | ICD-10-CM | POA: Insufficient documentation

## 2015-03-08 DIAGNOSIS — G8929 Other chronic pain: Secondary | ICD-10-CM | POA: Insufficient documentation

## 2015-03-08 HISTORY — DX: Other chronic pain: G89.29

## 2015-03-08 LAB — CBC WITH DIFFERENTIAL/PLATELET
BASOS PCT: 0 % (ref 0–1)
Basophils Absolute: 0 10*3/uL (ref 0.0–0.1)
EOS ABS: 0.2 10*3/uL (ref 0.0–0.7)
EOS PCT: 2 % (ref 0–5)
HCT: 34.1 % — ABNORMAL LOW (ref 36.0–46.0)
Hemoglobin: 11.7 g/dL — ABNORMAL LOW (ref 12.0–15.0)
Lymphocytes Relative: 33 % (ref 12–46)
Lymphs Abs: 2.4 10*3/uL (ref 0.7–4.0)
MCH: 31.7 pg (ref 26.0–34.0)
MCHC: 34.3 g/dL (ref 30.0–36.0)
MCV: 92.4 fL (ref 78.0–100.0)
MONO ABS: 0.4 10*3/uL (ref 0.1–1.0)
MONOS PCT: 6 % (ref 3–12)
NEUTROS PCT: 59 % (ref 43–77)
Neutro Abs: 4.3 10*3/uL (ref 1.7–7.7)
PLATELETS: 216 10*3/uL (ref 150–400)
RBC: 3.69 MIL/uL — ABNORMAL LOW (ref 3.87–5.11)
RDW: 13.3 % (ref 11.5–15.5)
WBC: 7.2 10*3/uL (ref 4.0–10.5)

## 2015-03-08 LAB — URINALYSIS, ROUTINE W REFLEX MICROSCOPIC
Bilirubin Urine: NEGATIVE
GLUCOSE, UA: NEGATIVE mg/dL
KETONES UR: NEGATIVE mg/dL
Leukocytes, UA: NEGATIVE
Nitrite: NEGATIVE
PROTEIN: 30 mg/dL — AB
Specific Gravity, Urine: 1.02 (ref 1.005–1.030)
Urobilinogen, UA: 0.2 mg/dL (ref 0.0–1.0)
pH: 5.5 (ref 5.0–8.0)

## 2015-03-08 LAB — URINE MICROSCOPIC-ADD ON

## 2015-03-08 LAB — COMPREHENSIVE METABOLIC PANEL
ALK PHOS: 87 U/L (ref 38–126)
ALT: 14 U/L (ref 14–54)
AST: 19 U/L (ref 15–41)
Albumin: 4.1 g/dL (ref 3.5–5.0)
Anion gap: 11 (ref 5–15)
BUN: 24 mg/dL — ABNORMAL HIGH (ref 6–20)
CALCIUM: 9.5 mg/dL (ref 8.9–10.3)
CO2: 28 mmol/L (ref 22–32)
CREATININE: 1.24 mg/dL — AB (ref 0.44–1.00)
Chloride: 97 mmol/L — ABNORMAL LOW (ref 101–111)
GFR calc non Af Amer: 45 mL/min — ABNORMAL LOW (ref >60)
GFR, EST AFRICAN AMERICAN: 52 mL/min — AB (ref >60)
Glucose, Bld: 146 mg/dL — ABNORMAL HIGH (ref 65–99)
Potassium: 3.3 mmol/L — ABNORMAL LOW (ref 3.5–5.1)
SODIUM: 136 mmol/L (ref 135–145)
Total Bilirubin: 0.6 mg/dL (ref 0.3–1.2)
Total Protein: 8.3 g/dL — ABNORMAL HIGH (ref 6.5–8.1)

## 2015-03-08 LAB — LIPASE, BLOOD: LIPASE: 15 U/L — AB (ref 22–51)

## 2015-03-08 MED ORDER — SODIUM CHLORIDE 0.9 % IV BOLUS (SEPSIS)
1000.0000 mL | Freq: Once | INTRAVENOUS | Status: AC
  Administered 2015-03-08: 1000 mL via INTRAVENOUS

## 2015-03-08 MED ORDER — IOHEXOL 300 MG/ML  SOLN
100.0000 mL | Freq: Once | INTRAMUSCULAR | Status: AC | PRN
  Administered 2015-03-08: 80 mL via INTRAVENOUS

## 2015-03-08 MED ORDER — FENTANYL CITRATE (PF) 100 MCG/2ML IJ SOLN
100.0000 ug | Freq: Once | INTRAMUSCULAR | Status: AC
  Administered 2015-03-08: 100 ug via INTRAVENOUS
  Filled 2015-03-08: qty 2

## 2015-03-08 MED ORDER — ONDANSETRON HCL 4 MG/2ML IJ SOLN
4.0000 mg | Freq: Once | INTRAMUSCULAR | Status: AC
  Administered 2015-03-08: 4 mg via INTRAVENOUS

## 2015-03-08 MED ORDER — SODIUM CHLORIDE 0.9 % IV SOLN
INTRAVENOUS | Status: DC
  Administered 2015-03-08: 125 mL/h via INTRAVENOUS

## 2015-03-08 MED ORDER — ONDANSETRON HCL 4 MG/2ML IJ SOLN
4.0000 mg | Freq: Once | INTRAMUSCULAR | Status: AC
Start: 2015-03-08 — End: 2015-03-08
  Administered 2015-03-08: 4 mg via INTRAVENOUS
  Filled 2015-03-08: qty 2

## 2015-03-08 MED ORDER — ONDANSETRON HCL 4 MG/2ML IJ SOLN
INTRAMUSCULAR | Status: AC
  Filled 2015-03-08: qty 2

## 2015-03-08 NOTE — ED Notes (Signed)
Called to CT because patient did not feel right. Pt was shivering but denies other symptoms. Stated she was not in pain, cold or anxious. Pt was given a warm blanked and said she could complete the test

## 2015-03-08 NOTE — ED Notes (Signed)
Pt up to bathroom.

## 2015-03-08 NOTE — ED Provider Notes (Signed)
CSN: [id number]     Arrival date & time 03/08/15  0628 History   First MD Initiated Contact with Patient 03/08/15 [phone]     Chief Complaint  Patient presents with  . Abdominal Pain     (Consider location/radiation/quality/duration/timing/severity/associated sxs/prior Treatment) HPI   [place] Megan [name] is Megan 64 y.o. female who presents for evaluation of abdominal pain which started before bedtime last night. During the day. She was well, and ate without problems. At about 1 AM this morning. She had Megan sudden onset of burning pain, which has waxed and waned. She has Megan sensation that she needs to either vomit or have Megan bowel movement, but has not been able to done either. He has had some dry heaving. She feels chills, and hot feeling occasionally. She has not measured her temperature. She denies cough, chest pain, weakness or dizziness. She has previously had diverticulitis. His never had colon surgery. There've been no other preceding symptoms. There are no other known modifying factors.   Past Medical History  Diagnosis Date  . HTN (hypertension)   . DM (diabetes mellitus)   . Anxiety   . Depression   . Obesity   . Colon polyps   . Complication of anesthesia   . PONV (postoperative nausea and vomiting)   . Agoraphobia with panic disorder    Past Surgical History  Procedure Laterality Date  . Cardiac catheterization  2004    normal  . S/p hysterectomy      complete  . Abdominal hysterectomy  15 yrs ago    with bso at aph  . Cholecystectomy  25 yrs ago    [facility]  . Colonoscopy  05/04/11    left-sided colonic diverticulosis  . Esophagogastroduodenoscopy  05/04/2011    Abnormal distal esophagus, biopsies consistent with inflammation due to acid reflux. No evidence of Barrett's. No H. pylori   Family History  Problem Relation Age of Onset  . Colon cancer Paternal Aunt     2, colon cancer  . Colon cancer Paternal Uncle     6, colon cancer  . Colon cancer Father     deceased, age  35  . Colon cancer      numerous cousins died before age 54 with colon cancer  . Colon cancer Sister     deceased, age 46  . Anesthesia problems Neg Hx   . Hypotension Neg Hx   . Malignant hyperthermia Neg Hx   . Pseudochol deficiency Neg Hx    Social History  Substance Use Topics  . Smoking status: Former Smoker -- 0.25 packs/day for 15 years    Types: Cigarettes  . Smokeless tobacco: None     Comment: one pack per month, never more than 1-2 cig/day in entire life  . Alcohol Use: No   OB History    No data available     Review of Systems  All other systems reviewed and are negative.     Allergies  Naproxen  Home Medications   Prior to Admission medications   Medication Sig Start Date End Date Taking? Authorizing Provider  atorvastatin (LIPITOR) 20 MG tablet Take 20 mg by mouth daily.   Yes Historical Provider, MD  diazepam (VALIUM) 10 MG tablet Take 10 mg by mouth at bedtime as needed. For sleep   Yes Historical Provider, MD  enalapril (VASOTEC) 20 MG tablet Take 20 mg by mouth 2 (two) times daily.    Yes Historical Provider, MD  glipiZIDE (GLUCOTROL) 10 MG tablet  Take 10 mg by mouth every morning. In the AM   Yes Historical Provider, MD  hydrochlorothiazide (HYDRODIURIL) 25 MG tablet Take 25 mg by mouth daily.     Yes Historical Provider, MD  HYDROcodone-acetaminophen (NORCO) 10-325 MG per tablet Take 1 tablet by mouth every 6 (six) hours as needed for moderate pain.   Yes Historical Provider, MD  metFORMIN (GLUCOPHAGE) 500 MG tablet Take 1,000 mg by mouth 2 (two) times daily with Megan meal.    Yes Historical Provider, MD  metoprolol (TOPROL-XL) 50 MG 24 hr tablet Take 50 mg by mouth daily.     Yes Historical Provider, MD  pioglitazone (ACTOS) 15 MG tablet Take 15 mg by mouth daily.   Yes Historical Provider, MD  traZODone (DESYREL) 100 MG tablet Take 100 mg by mouth at bedtime.     Yes Historical Provider, MD  oxyCODONE-acetaminophen (PERCOCET/ROXICET) 5-325 MG per tablet  Take 1 tablet by mouth every 4 (four) hours as needed for pain. Patient not taking: Reported on 03/08/2015 05/06/13   [name], MD   BP 158/78 mmHg  Pulse 65  Temp(Src) 97.6 F (36.4 C) (Oral)  Resp 18  Ht 5\' 2"  (1.575 m)  Wt 200 lb (90.719 kg)  BMI 36.57 kg/m2  SpO2 100% Physical Exam  Constitutional: She is oriented to person, place, and time. She appears well-developed and well-nourished.  HENT:  Head: Normocephalic and atraumatic.  Right Ear: External ear normal.  Left Ear: External ear normal.  Eyes: Conjunctivae and EOM are normal. Pupils are equal, round, and reactive to light.  Neck: Normal range of motion and phonation normal. Neck supple.  Cardiovascular: Normal rate, regular rhythm and normal heart sounds.   Pulmonary/Chest: Effort normal and breath sounds normal. She exhibits no bony tenderness.  Abdominal: Soft. There is tenderness (diffuse, mild).  Musculoskeletal: Normal range of motion.  Neurological: She is alert and oriented to person, place, and time. No cranial nerve deficit or sensory deficit. She exhibits normal muscle tone. Coordination normal.  Skin: Skin is warm, dry and intact.  Psychiatric: She has Megan normal mood and affect. Her behavior is normal. Judgment and thought content normal.  Nursing note and vitals reviewed.   ED Course  Procedures (including critical care time)   Medications  0.9 %  sodium chloride infusion (125 mL/hr Intravenous New Bag/Given 03/08/15 0729)  ondansetron (ZOFRAN) injection 4 mg (4 mg Intravenous Given 03/08/15 0729)  sodium chloride 0.9 % bolus 1,000 mL (1,000 mLs Intravenous New Bag/Given 03/08/15 0728)  fentaNYL (SUBLIMAZE) injection 100 mcg (100 mcg Intravenous Given 03/08/15 0729)  iohexol (OMNIPAQUE) 300 MG/ML solution 100 mL (80 mLs Intravenous Contrast Given 03/08/15 0825)    Patient Vitals for the past 24 hrs:  BP Temp Temp src Pulse Resp SpO2 Height Weight  03/08/15 1013 158/78 mmHg - - 65 18 100 % - -  03/08/15  0930 185/83 mmHg - - 61 - 97 % - -  03/08/15 0900 184/93 mmHg - - 68 - 97 % - -  03/08/15 0800 161/75 mmHg - - 63 - 95 % - -  03/08/15 0730 193/83 mmHg - - 64 - 98 % - -  03/08/15 0645 180/88 mmHg 97.6 F (36.4 C) Oral 71 18 97 % 5\' 2"  (1.575 m) 200 lb (90.719 kg)   11:00 Megan.m.- based discussed with urology, Dr. Annabell Howells, who states that patient should follow-up with vascular surgery, regarding renal infarcts.  11:50 Megan.m.-case discussed with vascular surgery, Dr. Arbie Cookey, who recommends outpatient  follow-up for renal infarcts. He will have his office schedule an appointment. He does not think that her current pain is from the renal infarcts.  12:04 PM Reevaluation with update and discussion. After initial assessment and treatment, an updated evaluation reveals patient now states she is hungry, thirsty, and wants some more pain medicine. Findings, reviewed with patient.Mancel Bale L   2-D cardiac echo, completed in ED, prior to discharge. Reading is pending.  13:40- patient is fully dressed, calm and comfortable. Findings discussed with the patient. She understands that she will follow-up with the vascular doctor on August 30 at 10:30 AM. She has pain medicine, to use at home.   Labs Review Labs Reviewed  COMPREHENSIVE METABOLIC PANEL - Abnormal; Notable for the following:    Potassium 3.3 (*)    Chloride 97 (*)    Glucose, Bld 146 (*)    BUN 24 (*)    Creatinine, Ser 1.24 (*)    Total Protein 8.3 (*)    GFR calc non Af Amer 45 (*)    GFR calc Af Amer 52 (*)    All other components within normal limits  CBC WITH DIFFERENTIAL/PLATELET - Abnormal; Notable for the following:    RBC 3.69 (*)    Hemoglobin 11.7 (*)    HCT 34.1 (*)    All other components within normal limits  LIPASE, BLOOD - Abnormal; Notable for the following:    Lipase 15 (*)    All other components within normal limits  URINALYSIS, ROUTINE W REFLEX MICROSCOPIC (NOT AT [facility]) - Abnormal; Notable for the following:     Hgb urine dipstick TRACE (*)    Protein, ur 30 (*)    All other components within normal limits  URINE MICROSCOPIC-ADD ON - Abnormal; Notable for the following:    Squamous Epithelial / LPF MANY (*)    All other components within normal limits    Imaging Review Ct Abdomen Pelvis W Contrast  03/08/2015   CLINICAL DATA:  Umbilical pain starting today at 1 Megan.m. Nausea. Diabetes.  EXAM: CT ABDOMEN AND PELVIS WITH CONTRAST  TECHNIQUE: Multidetector CT imaging of the abdomen and pelvis was performed using the standard protocol following bolus administration of intravenous contrast.  CONTRAST:  80mL OMNIPAQUE IOHEXOL 300 MG/ML  SOLN  COMPARISON:  01/13/2011  FINDINGS: Lower chest: Chronic peripheral scarring in the right middle lobe. There is also scarring in the posterior basal segments of both lower lobes. Mild prominence of the right and left atria.  Hepatobiliary: Cholecystectomy. Mild intrahepatic biliary dilatation. Common bile duct diameter proximally 6 mm.  Pancreas: Unremarkable  Spleen: Unremarkable  Adrenals/Urinary Tract: 1.4 by 2.1 cm left adrenal mass, similar size to 12/12/2009, relative washout 49%, compatible with adrenal adenoma.  Abnormal regional hypodensity in the left kidney lower pole on portal venous and delayed phase images. Triangular wedge-shaped hypodensity in the left mid kidney, image 29 series 2, new compared to prior CT.  Stomach/Bowel: Periampullary duodenal diverticulum without inflammation. Scattered sigmoid colon diverticula.  Vascular/Lymphatic: Aortoiliac atherosclerotic vascular disease. There is some atherosclerotic calcification near the origin of the left renal artery but the renal artery appears patent otherwise. There are 2 right renal arteries.  Reproductive: Hysterectomy.  Ovaries not well seen.  Other: No supplemental non-categorized findings.  Musculoskeletal: Bilateral foraminal impingement due to facet arthropathy, intervertebral spurring, and disc bulge at L5-S1.   IMPRESSION: 1. Abnormal sharply defined hypodensity in the left kidney lower pole anteromedially, also with Megan new wedge-shaped hypodensity posteriorly in the  left mid kidney. Appearance favors renal infarcts. Multifocal pyelonephritis less likely, correlate with urine analysis. Although there is some atheromatous calcification near the origin of the left renal artery, no definite other abnormality of the renal artery is identified, with the understanding that today's exam was not Megan CT angiogram. The possibility of embolic phenomenon should also be considered. There is mild prominence of both the right and left atrium of the heart. 2. Other imaging findings of potential clinical significance: Stable left adrenal adenoma ; sigmoid colon diverticulosis ; periampullary duodenal diverticulum; aortoiliac atherosclerosis; mild chronic intrahepatic biliary dilatation possibly related to prior cholecystectomy; and bilateral foraminal stenosis at L5-S1 due to spondylosis and degenerative disc disease. These results were called by telephone at the time of interpretation on 03/08/2015 at 9:35 am to Dr. Mancel Bale , who verbally acknowledged these results.   Electronically Signed   By: Gaylyn Rong M.D.   On: 03/08/2015 09:38   I have personally reviewed and evaluated these images and lab results as part of my medical decision-making.    EKG Interpretation   Date/Time:  Monday March 08 2015 10:18:32 EDT Ventricular Rate:  63 PR Interval:  141 QRS Duration: 96 QT Interval:  454 QTC Calculation: 465 R Axis:   91 Text Interpretation:  Sinus rhythm Right axis deviation since last tracing  no significant change Confirmed by Reigna Ruperto  MD, Hayle Parisi ([id number]) on 03/08/2015  10:28:57 AM      MDM   Final diagnoses:  Abdominal pain, unspecified abdominal location  Renal infarct    Nonspecific abdominal pain, with nondiagnostic evaluation in ED. 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  These most likely represent an incidental problem, and not related to the current painful condition. Ratios have been made for follow-up with vascular surgery regarding her renal infarcts, and she has short-term follow-up scheduled with her PCP for 4 days from now.  Nursing Notes Reviewed/ Care Coordinated Applicable Imaging Reviewed Interpretation of Laboratory Data incorporated into ED treatment  The patient appears reasonably screened and/or stabilized for discharge and I doubt any other medical condition or other [facility] requiring further screening, evaluation, or treatment in the ED at this time prior to discharge.  Plan: Home Medications- usual; Home Treatments- rest; return here if the recommended treatment, does not improve the symptoms; Recommended follow up- PCP prn     Mancel Bale, MD 03/08/15 1348

## 2015-03-08 NOTE — ED Notes (Signed)
Pt c/o umbilical pain that started at 0100 today; pt states she has not vomited but has had the dry heaves

## 2015-03-08 NOTE — Discharge Instructions (Signed)

## 2015-03-08 NOTE — ED Notes (Signed)
Pt given something to eat and drink per Dr. Mable Paris ok.

## 2015-03-15 ENCOUNTER — Encounter: Payer: Self-pay | Admitting: Vascular Surgery

## 2015-03-16 ENCOUNTER — Encounter: Payer: Medicaid Other | Admitting: Vascular Surgery

## 2015-07-17 ENCOUNTER — Encounter (HOSPITAL_COMMUNITY): Payer: Self-pay | Admitting: Emergency Medicine

## 2015-07-17 ENCOUNTER — Emergency Department (HOSPITAL_COMMUNITY): Payer: Commercial Managed Care - HMO

## 2015-07-17 ENCOUNTER — Emergency Department (HOSPITAL_COMMUNITY)
Admission: EM | Admit: 2015-07-17 | Discharge: 2015-07-17 | Disposition: A | Discharge status: Home / Self Care | Payer: Commercial Managed Care - HMO | Attending: Emergency Medicine | Admitting: Emergency Medicine

## 2015-07-17 DIAGNOSIS — I10 Essential (primary) hypertension: Secondary | ICD-10-CM | POA: Diagnosis not present

## 2015-07-17 DIAGNOSIS — Y998 Other external cause status: Secondary | ICD-10-CM | POA: Diagnosis not present

## 2015-07-17 DIAGNOSIS — Y9389 Activity, other specified: Secondary | ICD-10-CM | POA: Diagnosis not present

## 2015-07-17 DIAGNOSIS — W1811XA Fall from or off toilet without subsequent striking against object, initial encounter: Secondary | ICD-10-CM | POA: Diagnosis not present

## 2015-07-17 DIAGNOSIS — Z79899 Other long term (current) drug therapy: Secondary | ICD-10-CM | POA: Insufficient documentation

## 2015-07-17 DIAGNOSIS — Z87891 Personal history of nicotine dependence: Secondary | ICD-10-CM | POA: Insufficient documentation

## 2015-07-17 DIAGNOSIS — Z86010 Personal history of colonic polyps: Secondary | ICD-10-CM | POA: Diagnosis not present

## 2015-07-17 DIAGNOSIS — E119 Type 2 diabetes mellitus without complications: Secondary | ICD-10-CM | POA: Insufficient documentation

## 2015-07-17 DIAGNOSIS — M25511 Pain in right shoulder: Secondary | ICD-10-CM

## 2015-07-17 DIAGNOSIS — F419 Anxiety disorder, unspecified: Secondary | ICD-10-CM | POA: Insufficient documentation

## 2015-07-17 DIAGNOSIS — R059 Cough, unspecified: Secondary | ICD-10-CM

## 2015-07-17 DIAGNOSIS — G8929 Other chronic pain: Secondary | ICD-10-CM | POA: Diagnosis not present

## 2015-07-17 DIAGNOSIS — Z7984 Long term (current) use of oral hypoglycemic drugs: Secondary | ICD-10-CM | POA: Diagnosis not present

## 2015-07-17 DIAGNOSIS — S4991XA Unspecified injury of right shoulder and upper arm, initial encounter: Secondary | ICD-10-CM | POA: Diagnosis present

## 2015-07-17 DIAGNOSIS — R05 Cough: Secondary | ICD-10-CM | POA: Insufficient documentation

## 2015-07-17 DIAGNOSIS — Y92002 Bathroom of unspecified non-institutional (private) residence single-family (private) house as the place of occurrence of the external cause: Secondary | ICD-10-CM | POA: Diagnosis not present

## 2015-07-17 DIAGNOSIS — E669 Obesity, unspecified: Secondary | ICD-10-CM | POA: Diagnosis not present

## 2015-07-17 DIAGNOSIS — F329 Major depressive disorder, single episode, unspecified: Secondary | ICD-10-CM | POA: Insufficient documentation

## 2015-07-17 MED ORDER — KETOROLAC TROMETHAMINE 60 MG/2ML IM SOLN
30.0000 mg | Freq: Once | INTRAMUSCULAR | Status: AC
  Administered 2015-07-17: 30 mg via INTRAMUSCULAR
  Filled 2015-07-17: qty 2

## 2015-07-17 MED ORDER — OXYCODONE-ACETAMINOPHEN 5-325 MG PO TABS: 1.0000 | ORAL_TABLET | ORAL | Status: DC | PRN

## 2015-07-17 MED ORDER — CYCLOBENZAPRINE HCL 10 MG PO TABS: 10.0000 mg | ORAL_TABLET | Freq: Two times a day (BID) | ORAL | Status: DC | PRN

## 2015-07-17 MED ORDER — MORPHINE SULFATE (PF) 4 MG/ML IV SOLN
8.0000 mg | Freq: Once | INTRAVENOUS | Status: AC
  Administered 2015-07-17: 8 mg via INTRAMUSCULAR
  Filled 2015-07-17: qty 2

## 2015-07-17 NOTE — ED Provider Notes (Signed)
CSN: [id number]     Arrival date & time 07/17/15  1527 History   First MD Initiated Contact with Patient 07/17/15 1544     Chief Complaint  Patient presents with  . Arm Injury     (Consider location/radiation/quality/duration/timing/severity/associated sxs/prior Treatment) Patient is a 64 y.o. female presenting with shoulder pain.  Shoulder Pain Location:  Shoulder Injury: yes   Mechanism of injury: fall   Fall:    Point of impact:  Unable to specify   Entrapped after fall: no   Shoulder location:  R shoulder Pain details:    Quality:  Aching and sharp   Radiates to:  Does not radiate   Severity:  Mild   Onset quality:  Sudden   Duration:  4 hours   Timing:  Constant Chronicity:  New Handedness:  Right-handed Dislocation: no   Prior injury to area:  No Ineffective treatments:  Narcotics Associated symptoms: decreased range of motion   Associated symptoms: no back pain, no fatigue, no fever, no muscle weakness, no neck pain, no numbness, no stiffness, no swelling and no tingling     Past Medical History  Diagnosis Date  . HTN (hypertension)   . DM (diabetes mellitus) (HCC)   . Anxiety   . Depression   . Obesity   . Colon polyps   . Complication of anesthesia   . PONV (postoperative nausea and vomiting)   . Agoraphobia with panic disorder   . Chronic pain 03/08/15    Hydrocodone-APAP 10/325 mg, # 120 q month   Past Surgical History  Procedure Laterality Date  . Cardiac catheterization  2004    normal  . S/p hysterectomy      complete  . Abdominal hysterectomy  15 yrs ago    with bso at aph  . Cholecystectomy  25 yrs ago    [facility]  . Colonoscopy  05/04/11    left-sided colonic diverticulosis  . Esophagogastroduodenoscopy  05/04/2011    Abnormal distal esophagus, biopsies consistent with inflammation due to acid reflux. No evidence of Barrett's. No H. pylori   Family History  Problem Relation Age of Onset  . Colon cancer Paternal Aunt     2, colon cancer   . Colon cancer Paternal Uncle     6, colon cancer  . Colon cancer Father     deceased, age 62  . Colon cancer      numerous cousins died before age 62 with colon cancer  . Colon cancer Sister     deceased, age 58  . Anesthesia problems Neg Hx   . Hypotension Neg Hx   . Malignant hyperthermia Neg Hx   . Pseudochol deficiency Neg Hx    Social History  Substance Use Topics  . Smoking status: Former Smoker -- 0.25 packs/day for 15 years    Types: Cigarettes  . Smokeless tobacco: None     Comment: one pack per month, never more than 1-2 cig/day in entire life  . Alcohol Use: No   OB History    No data available     Review of Systems  Constitutional: Negative for fever and fatigue.  Musculoskeletal: Negative for back pain, stiffness and neck pain.  All other systems reviewed and are negative.     Allergies  Naproxen  Home Medications   Prior to Admission medications   Medication Sig Start Date End Date Taking? Authorizing Provider  atorvastatin (LIPITOR) 20 MG tablet Take 20 mg by mouth at bedtime.    Yes Historical  Provider, MD  diazepam (VALIUM) 10 MG tablet Take 10 mg by mouth at bedtime as needed. For sleep   Yes Historical Provider, MD  enalapril (VASOTEC) 20 MG tablet Take 20 mg by mouth 2 (two) times daily.    Yes Historical Provider, MD  glipiZIDE (GLUCOTROL) 10 MG tablet Take 10 mg by mouth every morning. In the AM   Yes Historical Provider, MD  hydrochlorothiazide (HYDRODIURIL) 25 MG tablet Take 25 mg by mouth daily.     Yes Historical Provider, MD  HYDROcodone-acetaminophen (NORCO) 10-325 MG per tablet Take 1 tablet by mouth every 6 (six) hours as needed for moderate pain.   Yes Historical Provider, MD  ibuprofen (ADVIL,MOTRIN) 200 MG tablet Take 400 mg by mouth every 6 (six) hours as needed for headache.   Yes Historical Provider, MD  levothyroxine (SYNTHROID, LEVOTHROID) 25 MCG tablet Take 25 mcg by mouth daily. 07/14/15  Yes Historical Provider, MD  metFORMIN  (GLUCOPHAGE) 500 MG tablet Take 1,000 mg by mouth 2 (two) times daily with a meal.    Yes Historical Provider, MD  metoprolol (TOPROL-XL) 50 MG 24 hr tablet Take 50 mg by mouth at bedtime.    Yes Historical Provider, MD  pioglitazone (ACTOS) 15 MG tablet Take 15 mg by mouth daily.   Yes Historical Provider, MD  traZODone (DESYREL) 150 MG tablet Take 150 mg by mouth at bedtime. 07/14/15  Yes Historical Provider, MD  cyclobenzaprine (FLEXERIL) 10 MG tablet Take 1 tablet (10 mg total) by mouth 2 (two) times daily as needed for muscle spasms. 07/17/15   Marily MemosJason Britlee Skolnik, MD  oxyCODONE-acetaminophen (PERCOCET/ROXICET) 5-325 MG tablet Take 1 tablet by mouth every 4 (four) hours as needed. 07/17/15   Barbara CowerJason Kenna Seward, MD   BP 139/85 mmHg  Pulse 96  Temp(Src) 97.6 F (36.4 C) (Oral)  Resp 16  SpO2 100% Physical Exam  Constitutional: She appears well-developed and well-nourished.  HENT:  Head: Normocephalic and atraumatic.  Neck: Normal range of motion.  Cardiovascular: Normal rate and regular rhythm.   Pulmonary/Chest: Effort normal and breath sounds normal. No stridor. No respiratory distress. She has no wheezes.  Abdominal: Soft. She exhibits no distension. There is no tenderness. There is no rebound.  Musculoskeletal: She exhibits tenderness (palpation of lateral right shoulder, pain with ROM of that shoulder as well). She exhibits no edema.  Neurological: She is alert.  Skin: Skin is warm and dry.  Nursing note and vitals reviewed.   ED Course  Procedures (including critical care time) Labs Review Labs Reviewed - No data to display  Imaging Review Dg Shoulder Right  07/17/2015  CLINICAL DATA:  64 year old female with acute right shoulder pain following injury yesterday. Initial encounter. EXAM: RIGHT SHOULDER - 2+ VIEW COMPARISON:  12/13/2009 chest radiograph FINDINGS: There is no evidence of acute fracture, subluxation or dislocation. Bulky calcifications in the subacromial region noted and  likely represents calcific bursitis/ tendinitis. No focal bony lesions are present. IMPRESSION: No evidence of acute bony abnormality. Subacromial calcific bursitis/ tendinitis. Electronically Signed   By: Harmon PierJeffrey  Hu M.D.   On: 07/17/2015 16:17   Dg Humerus Right  07/17/2015  CLINICAL DATA:  Initial encounter for right shoulder arm pain after slip and fall onto right shoulder yesterday. EXAM: RIGHT HUMERUS - 2+ VIEW COMPARISON:  None. FINDINGS: Two view exam of the right humerus shows no evidence for fracture. No worrisome lytic or sclerotic osseous abnormality. Calcific tendinitis noted in the shoulder. IMPRESSION: No acute bony findings. Electronically Signed  By: Kennith Center M.D.   On: 07/17/2015 16:16   I have personally reviewed and evaluated these images and lab results as part of my medical decision-making.   EKG Interpretation None      MDM   Final diagnoses:  Right shoulder pain  Cough   Right shoulder pain after trying to pull herself off of the toilet last night and she fell on her right shoulder. Since that she did decreased range of motion secondary to pain. Tried Norco at home which did not help sucking here for further evaluation. Exam as above. Likely rotator cuff injury so I will give her sling her follow-up with her primary doctor. Discussed shoulder range of motion exercises to avoid adhesive capsulitis. Patient also complaining of cough and runny nose this been going on for 3 weeks. No fevers, chills, shortness of breath or change in the sputum. Likely viral respiratory infection will return here for any new or worsening symptoms.      Marily Memos, MD 07/17/15 [phone]

## 2015-07-17 NOTE — ED Notes (Signed)
Pt verbalized understanding of no driving and to use caution within 4 hours of taking pain meds due to meds cause drowsiness 

## 2015-07-17 NOTE — ED Notes (Signed)
Pt states that she tried to pull self up on the door knob and slipped and fell on right shoulder.  Has not been able to use right arm since Friday.

## 2015-07-17 NOTE — Discharge Instructions (Signed)
As much as  Your pain will allow, please do the shoulder exercises to avoid your shoulder getting worse. If you don't use your shoulder, you can develop a problem where it has decreased range of motion, even to the point of not being able to move it. This is why it is necessary to do the exercises and have close primary care follow up.

## 2015-07-17 NOTE — ED Notes (Signed)
Patient states she is in severe pain. RN made aware.

## 2015-08-20 ENCOUNTER — Encounter (HOSPITAL_COMMUNITY)
Admission: EM | Disposition: A | Discharge status: Home Health | Payer: Self-pay | Source: Home / Self Care | Attending: Neurology

## 2015-08-20 ENCOUNTER — Encounter (HOSPITAL_COMMUNITY): Payer: Self-pay | Admitting: *Deleted

## 2015-08-20 ENCOUNTER — Emergency Department (HOSPITAL_COMMUNITY): Payer: Commercial Managed Care - HMO

## 2015-08-20 ENCOUNTER — Emergency Department (HOSPITAL_COMMUNITY): Payer: Commercial Managed Care - HMO | Admitting: Anesthesiology

## 2015-08-20 ENCOUNTER — Inpatient Hospital Stay (HOSPITAL_COMMUNITY)
Admission: EM | Admit: 2015-08-20 | Discharge: 2015-08-24 | DRG: 061 | Disposition: A | Discharge status: Home Health | Payer: Commercial Managed Care - HMO | Attending: Neurology | Admitting: Neurology

## 2015-08-20 DIAGNOSIS — I63411 Cerebral infarction due to embolism of right middle cerebral artery: Secondary | ICD-10-CM | POA: Diagnosis present

## 2015-08-20 DIAGNOSIS — I272 Other secondary pulmonary hypertension: Secondary | ICD-10-CM | POA: Diagnosis present

## 2015-08-20 DIAGNOSIS — F4001 Agoraphobia with panic disorder: Secondary | ICD-10-CM | POA: Diagnosis present

## 2015-08-20 DIAGNOSIS — F329 Major depressive disorder, single episode, unspecified: Secondary | ICD-10-CM | POA: Diagnosis present

## 2015-08-20 DIAGNOSIS — I639 Cerebral infarction, unspecified: Secondary | ICD-10-CM | POA: Insufficient documentation

## 2015-08-20 DIAGNOSIS — R4781 Slurred speech: Secondary | ICD-10-CM | POA: Diagnosis present

## 2015-08-20 DIAGNOSIS — R52 Pain, unspecified: Secondary | ICD-10-CM

## 2015-08-20 DIAGNOSIS — H53462 Homonymous bilateral field defects, left side: Secondary | ICD-10-CM | POA: Diagnosis present

## 2015-08-20 DIAGNOSIS — E785 Hyperlipidemia, unspecified: Secondary | ICD-10-CM | POA: Diagnosis present

## 2015-08-20 DIAGNOSIS — M25562 Pain in left knee: Secondary | ICD-10-CM | POA: Diagnosis not present

## 2015-08-20 DIAGNOSIS — Z90710 Acquired absence of both cervix and uterus: Secondary | ICD-10-CM | POA: Diagnosis not present

## 2015-08-20 DIAGNOSIS — I4891 Unspecified atrial fibrillation: Secondary | ICD-10-CM | POA: Diagnosis not present

## 2015-08-20 DIAGNOSIS — I63311 Cerebral infarction due to thrombosis of right middle cerebral artery: Secondary | ICD-10-CM

## 2015-08-20 DIAGNOSIS — E1165 Type 2 diabetes mellitus with hyperglycemia: Secondary | ICD-10-CM | POA: Diagnosis present

## 2015-08-20 DIAGNOSIS — D539 Nutritional anemia, unspecified: Secondary | ICD-10-CM | POA: Diagnosis present

## 2015-08-20 DIAGNOSIS — Z7984 Long term (current) use of oral hypoglycemic drugs: Secondary | ICD-10-CM | POA: Diagnosis not present

## 2015-08-20 DIAGNOSIS — G8194 Hemiplegia, unspecified affecting left nondominant side: Secondary | ICD-10-CM | POA: Diagnosis present

## 2015-08-20 DIAGNOSIS — Z87891 Personal history of nicotine dependence: Secondary | ICD-10-CM | POA: Diagnosis not present

## 2015-08-20 DIAGNOSIS — Z79899 Other long term (current) drug therapy: Secondary | ICD-10-CM

## 2015-08-20 DIAGNOSIS — R29810 Facial weakness: Secondary | ICD-10-CM | POA: Diagnosis present

## 2015-08-20 DIAGNOSIS — R519 Headache, unspecified: Secondary | ICD-10-CM | POA: Insufficient documentation

## 2015-08-20 DIAGNOSIS — I10 Essential (primary) hypertension: Secondary | ICD-10-CM | POA: Diagnosis present

## 2015-08-20 DIAGNOSIS — R51 Headache: Secondary | ICD-10-CM

## 2015-08-20 DIAGNOSIS — M25569 Pain in unspecified knee: Secondary | ICD-10-CM | POA: Insufficient documentation

## 2015-08-20 DIAGNOSIS — N289 Disorder of kidney and ureter, unspecified: Secondary | ICD-10-CM | POA: Diagnosis present

## 2015-08-20 DIAGNOSIS — I618 Other nontraumatic intracerebral hemorrhage: Secondary | ICD-10-CM | POA: Diagnosis not present

## 2015-08-20 DIAGNOSIS — E669 Obesity, unspecified: Secondary | ICD-10-CM | POA: Diagnosis present

## 2015-08-20 DIAGNOSIS — Z6836 Body mass index (BMI) 36.0-36.9, adult: Secondary | ICD-10-CM

## 2015-08-20 DIAGNOSIS — I6789 Other cerebrovascular disease: Secondary | ICD-10-CM | POA: Diagnosis not present

## 2015-08-20 DIAGNOSIS — I63511 Cerebral infarction due to unspecified occlusion or stenosis of right middle cerebral artery: Secondary | ICD-10-CM | POA: Diagnosis not present

## 2015-08-20 DIAGNOSIS — I6310 Cerebral infarction due to embolism of unspecified precerebral artery: Secondary | ICD-10-CM

## 2015-08-20 HISTORY — PX: RADIOLOGY WITH ANESTHESIA: SHX6223

## 2015-08-20 LAB — I-STAT TROPONIN, ED: TROPONIN I, POC: 0.01 ng/mL (ref 0.00–0.08)

## 2015-08-20 LAB — COMPREHENSIVE METABOLIC PANEL
ALT: 8 U/L — ABNORMAL LOW (ref 14–54)
AST: 16 U/L (ref 15–41)
Albumin: 4 g/dL (ref 3.5–5.0)
Alkaline Phosphatase: 95 U/L (ref 38–126)
Anion gap: 10 (ref 5–15)
BUN: 24 mg/dL — ABNORMAL HIGH (ref 6–20)
CHLORIDE: 100 mmol/L — AB (ref 101–111)
CO2: 27 mmol/L (ref 22–32)
Calcium: 9.7 mg/dL (ref 8.9–10.3)
Creatinine, Ser: 1.28 mg/dL — ABNORMAL HIGH (ref 0.44–1.00)
GFR, EST AFRICAN AMERICAN: 50 mL/min — AB (ref >60)
GFR, EST NON AFRICAN AMERICAN: 43 mL/min — AB (ref >60)
Glucose, Bld: 183 mg/dL — ABNORMAL HIGH (ref 65–99)
POTASSIUM: 4.1 mmol/L (ref 3.5–5.1)
Sodium: 137 mmol/L (ref 135–145)
TOTAL PROTEIN: 8.2 g/dL — AB (ref 6.5–8.1)
Total Bilirubin: 0.7 mg/dL (ref 0.3–1.2)

## 2015-08-20 LAB — DIFFERENTIAL
BASOS PCT: 0 %
Basophils Absolute: 0 10*3/uL (ref 0.0–0.1)
EOS ABS: 0.1 10*3/uL (ref 0.0–0.7)
EOS PCT: 1 %
Lymphocytes Relative: 12 %
Lymphs Abs: 1.1 10*3/uL (ref 0.7–4.0)
MONO ABS: 0.4 10*3/uL (ref 0.1–1.0)
MONOS PCT: 4 %
Neutro Abs: 7.9 10*3/uL — ABNORMAL HIGH (ref 1.7–7.7)
Neutrophils Relative %: 83 %

## 2015-08-20 LAB — CBC
HEMATOCRIT: 31.1 % — AB (ref 36.0–46.0)
Hemoglobin: 10.4 g/dL — ABNORMAL LOW (ref 12.0–15.0)
MCH: 30.5 pg (ref 26.0–34.0)
MCHC: 33.4 g/dL (ref 30.0–36.0)
MCV: 91.2 fL (ref 78.0–100.0)
Platelets: 177 10*3/uL (ref 150–400)
RBC: 3.41 MIL/uL — ABNORMAL LOW (ref 3.87–5.11)
RDW: 13.4 % (ref 11.5–15.5)
WBC: 9.6 10*3/uL (ref 4.0–10.5)

## 2015-08-20 LAB — CBG MONITORING, ED: Glucose-Capillary: 167 mg/dL — ABNORMAL HIGH (ref 65–99)

## 2015-08-20 LAB — ETHANOL

## 2015-08-20 LAB — PROTIME-INR
INR: 1.1 (ref 0.00–1.49)
Prothrombin Time: 14.4 seconds (ref 11.6–15.2)

## 2015-08-20 LAB — APTT: APTT: 31 s (ref 24–37)

## 2015-08-20 SURGERY — RADIOLOGY WITH ANESTHESIA
Anesthesia: General

## 2015-08-20 MED ORDER — ALTEPLASE 100 MG IV SOLR
INTRAVENOUS | Status: AC
  Administered 2015-08-20: 81 mg via INTRAVENOUS
  Filled 2015-08-20: qty 100

## 2015-08-20 MED ORDER — IOHEXOL 300 MG/ML  SOLN
150.0000 mL | Freq: Once | INTRAMUSCULAR | Status: AC | PRN
Start: 2015-08-20 — End: 2015-08-20
  Administered 2015-08-20: 50 mL via INTRA_ARTERIAL

## 2015-08-20 MED ORDER — ALTEPLASE (STROKE) FULL DOSE INFUSION
0.9000 mg/kg | Freq: Once | INTRAVENOUS | Status: AC
  Administered 2015-08-20: 81 mg via INTRAVENOUS
  Filled 2015-08-20: qty 100

## 2015-08-20 MED ORDER — IOHEXOL 350 MG/ML SOLN
100.0000 mL | Freq: Once | INTRAVENOUS | Status: AC | PRN
  Administered 2015-08-20: 75 mL via INTRAVENOUS

## 2015-08-20 MED ORDER — MIDAZOLAM HCL 2 MG/2ML IJ SOLN
INTRAMUSCULAR | Status: DC | PRN
  Administered 2015-08-20: .5 mg via INTRAVENOUS
  Administered 2015-08-20: 1 mg via INTRAVENOUS

## 2015-08-20 MED ORDER — LACTATED RINGERS IV SOLN
INTRAVENOUS | Status: DC | PRN
  Administered 2015-08-20: 23:00:00 via INTRAVENOUS

## 2015-08-20 MED ORDER — FENTANYL CITRATE (PF) 100 MCG/2ML IJ SOLN
INTRAMUSCULAR | Status: DC | PRN
  Administered 2015-08-20: 25 ug via INTRAVENOUS
  Administered 2015-08-20: 50 ug via INTRAVENOUS

## 2015-08-20 MED ORDER — SODIUM CHLORIDE 0.9 % IV SOLN: 50.0000 mL | Freq: Once | INTRAVENOUS | Status: AC

## 2015-08-20 NOTE — ED Notes (Signed)
Teleneuro consult in progress 

## 2015-08-20 NOTE — ED Notes (Signed)
tele neurology being performed at 19;50

## 2015-08-20 NOTE — ED Notes (Signed)
Per Neuroglogist patient does not need foley catheter. Patient assisted with bedpan.

## 2015-08-20 NOTE — Progress Notes (Signed)
1935 ER called DG radiology code stroke on its way in to ER 1936 Beeper for code stroke 1937 patient brought to ER by two RNs in ED and EMS [phone] patient moved to CT table 1942 patient took back to the ER by the two RNs from the ED 1945 completed everything in EPIC 1952 report called to EDP by RAD 1953 Final in computer

## 2015-08-20 NOTE — ED Notes (Signed)
Assisted patient with bedpan 

## 2015-08-20 NOTE — ED Notes (Signed)
Patient transported to CT 

## 2015-08-20 NOTE — ED Notes (Addendum)
Pt arrived to er by [facility] with c/o cva symptoms, last time know well per ems was at 4pm, pt arrived to er with slurred speech, left arm flacid, weakness noted to left lower leg, Dr Deretha Emory at bedside upon pt to arrival, per family member at bedside, family reports that he left home at 4pm, came back at around 5pm and found pt lying in floor,

## 2015-08-20 NOTE — ED Notes (Signed)
Able to squeeze writers hand with weak grip of left hand.

## 2015-08-20 NOTE — ED Notes (Signed)
Writer left [facility] with EMS to [employer].

## 2015-08-20 NOTE — ED Notes (Signed)
Returned from ct scan, lab at bedside

## 2015-08-20 NOTE — ED Notes (Signed)
MD at bedside. 

## 2015-08-20 NOTE — Procedures (Signed)
S/P  $ vessel cerebral arteriogram  Rt CFa approach. Findings. 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

## 2015-08-20 NOTE — Anesthesia Preprocedure Evaluation (Addendum)
Anesthesia Evaluation  Patient identified by MRN, date of birth, ID band Patient confused    Reviewed: Allergy & Precautions, H&P , NPO status , Patient's Chart, lab work & pertinent test results, reviewed documented beta blocker date and time   History of Anesthesia Complications (+) PONV  Airway Mallampati: II  TM Distance: >3 FB Neck ROM: Full    Dental no notable dental hx. (+) Teeth Intact, Dental Advisory Given   Pulmonary neg pulmonary ROS, former smoker,    Pulmonary exam normal breath sounds clear to auscultation       Cardiovascular hypertension, Pt. on medications and Pt. on home beta blockers negative cardio ROS   Rhythm:Regular Rate:Normal     Neuro/Psych Anxiety Depression negative neurological ROS  negative psych ROS   GI/Hepatic Neg liver ROS, GERD  ,  Endo/Other  negative endocrine ROSdiabetes, Type 2, Oral Hypoglycemic AgentsMorbid obesity  Renal/GU negative Renal ROS  negative genitourinary   Musculoskeletal   Abdominal   Peds  Hematology negative hematology ROS (+)   Anesthesia Other Findings   Reproductive/Obstetrics negative OB ROS                            Anesthesia Physical Anesthesia Plan  ASA: III and emergent  Anesthesia Plan: General   Post-op Pain Management:    Induction: Intravenous, Rapid sequence and Cricoid pressure planned  Airway Management Planned: Oral ETT  Additional Equipment: Arterial line  Intra-op Plan:   Post-operative Plan: Post-operative intubation/ventilation  Informed Consent: I have reviewed the patients History and Physical, chart, labs and discussed the procedure including the risks, benefits and alternatives for the proposed anesthesia with the patient or authorized representative who has indicated his/her understanding and acceptance.   Dental advisory given  Plan Discussed with: CRNA  Anesthesia Plan Comments:          Anesthesia Quick Evaluation

## 2015-08-20 NOTE — ED Notes (Signed)
Report given to Renea Ee at [facility] IVR.

## 2015-08-20 NOTE — H&P (Signed)
Admission H&P    Chief Complaint: Acute onset of left facial droop and side weakness.  HPI: Megan [name] is an 65 y.o. female with a history of diabetes mellitus, hypertension and obesity who was seen in the ED at [facility] following acute onset of left facial droop, slurred speech and left-sided weakness. He was last known well at 4 PM today. Family found her on the floor home at 5 PM. CT scan of her head showed no acute intracranial abnormality. Teleneurologist was consulted and recommended TPA which was administered. There was improvement in right of left upper extremity but no other noticeable improvements. CT angiogram showed thrombus involving right middle cerebral artery with partial occlusion. Patient was transferred to [name] for further management, including interventional radiology procedures. Angiogram showed occlusion of a distal MCA branch which was not amenable to thrombectomy. Patient was beyond time window for use of intra-arterial TPA. She was admitted to neuro intensive care unit for further management.  LSN: 4 PM on 08/20/2015 tPA Given: Yes mRankin:  Past Medical History  Diagnosis Date  . HTN (hypertension)   . DM (diabetes mellitus) ([place])   . Anxiety   . Depression   . Obesity   . Colon polyps   . Complication of anesthesia   . PONV (postoperative nausea and vomiting)   . Agoraphobia with panic disorder   . Chronic pain 03/08/15    Hydrocodone-APAP 10/325 mg, # 120 q month    Past Surgical History  Procedure Laterality Date  . Cardiac catheterization  2004    normal  . S/p hysterectomy      complete  . Abdominal hysterectomy  15 yrs ago    with bso at aph  . Cholecystectomy  25 yrs ago    [facility]  . Colonoscopy  05/04/11    left-sided colonic diverticulosis  . Esophagogastroduodenoscopy  05/04/2011    Abnormal distal esophagus, biopsies consistent with inflammation due to acid reflux. No evidence of Barrett's. No H. pylori    Family History  Problem Relation  Age of Onset  . Colon cancer Paternal Aunt     2, colon cancer  . Colon cancer Paternal Uncle     6, colon cancer  . Colon cancer Father     deceased, age 37  . Colon cancer      numerous cousins died before age 18 with colon cancer  . Colon cancer Sister     deceased, age 49  . Anesthesia problems Neg Hx   . Hypotension Neg Hx   . Malignant hyperthermia Neg Hx   . Pseudochol deficiency Neg Hx    Social History:  reports that she has quit smoking. Her smoking use included Cigarettes. She has a 3.75 pack-year smoking history. She does not have any smokeless tobacco history on file. She reports that she does not drink alcohol or use illicit drugs.  Allergies:  Allergies  Allergen Reactions  . Naproxen     Causes stomach burning    Medications Prior to Admission  Medication Sig Dispense Refill  . atorvastatin (LIPITOR) 20 MG tablet Take 20 mg by mouth at bedtime.     . cyclobenzaprine (FLEXERIL) 10 MG tablet Take 1 tablet (10 mg total) by mouth 2 (two) times daily as needed for muscle spasms. 20 tablet 0  . diazepam (VALIUM) 10 MG tablet Take 10 mg by mouth at bedtime as needed. For sleep    . enalapril (VASOTEC) 20 MG tablet Take 20 mg by mouth 2 (two)  times daily.     Marland Kitchen glipiZIDE (GLUCOTROL) 10 MG tablet Take 10 mg by mouth every morning. In the AM    . hydrochlorothiazide (HYDRODIURIL) 25 MG tablet Take 25 mg by mouth daily.      Marland Kitchen HYDROcodone-acetaminophen (NORCO) 10-325 MG per tablet Take 1 tablet by mouth every 6 (six) hours as needed for moderate pain.    Marland Kitchen ibuprofen (ADVIL,MOTRIN) 200 MG tablet Take 400 mg by mouth every 6 (six) hours as needed for headache.    . levothyroxine (SYNTHROID, LEVOTHROID) 25 MCG tablet Take 25 mcg by mouth daily.    . metFORMIN (GLUCOPHAGE) 500 MG tablet Take 1,000 mg by mouth 2 (two) times daily with a meal.     . metoprolol (TOPROL-XL) 50 MG 24 hr tablet Take 50 mg by mouth at bedtime.     Marland Kitchen oxyCODONE-acetaminophen (PERCOCET/ROXICET) 5-325 MG  tablet Take 1 tablet by mouth every 4 (four) hours as needed. 20 tablet 0  . pioglitazone (ACTOS) 15 MG tablet Take 15 mg by mouth daily.    . traZODone (DESYREL) 150 MG tablet Take 150 mg by mouth at bedtime.      ROS: Unable to obtain due to patient's mental status and difficulty.  Physical Examination: Blood pressure 94/62, pulse 78, temperature 97.7 F (36.5 C), temperature source Oral, resp. rate 18, weight 90 kg (198 lb 6.6 oz), SpO2 95 %.  HEENT-  Normocephalic, no lesions, without obvious abnormality.  Normal external eye and conjunctiva.  Normal TM's bilaterally.  Normal auditory canals and external ears. Normal external nose, mucus membranes and septum.  Normal pharynx. Neck supple with no masses, nodes, nodules or enlargement. Cardiovascular - regular rate and rhythm, S1, S2 normal, no murmur, click, rub or gallop Lungs - chest clear, no wheezing, rales, normal symmetric air entry Abdomen - soft, non-tender; bowel sounds normal; no masses,  no organomegaly Extremities - no joint deformities, effusion, or inflammation and no edema  Neurologic Examination: Patient was lethargic and moderately confused. She was in no acute distress. There was gaze preference to the right side. Pupils were equal and reacted normally to light. Extraocular movements were full with right left lateral gaze, and were conjugate. Patient had a gaze preference to the right side, however. Dense left homonymous hemianopsia noted. Moderately severe left lower facial weakness noted. Moderately severe dysarthria also noted. Motor exam showed moderately severe weakness of left upper extremity proximally and distally, as well as moderate weakness proximally of left lower extremity. Strength of right extremities was normal. Deep tendon reflexes were trace to 1+ and symmetrical area Plantar responses were flexor bilaterally. Carotid auscultation was normal.  Results for orders placed or performed during the  hospital encounter of 08/20/15 (from the past 48 hour(s))  Ethanol     Status: None   Collection Time: 08/20/15  7:47 PM  Result Value Ref Range   Alcohol, Ethyl (B) <5 <5 mg/dL    Comment:        LOWEST DETECTABLE LIMIT FOR SERUM ALCOHOL IS 5 mg/dL FOR MEDICAL PURPOSES ONLY   Protime-INR     Status: None   Collection Time: 08/20/15  7:47 PM  Result Value Ref Range   Prothrombin Time 14.4 11.6 - 15.2 seconds   INR 1.10 0.00 - 1.49  APTT     Status: None   Collection Time: 08/20/15  7:47 PM  Result Value Ref Range   aPTT 31 24 - 37 seconds  CBC     Status: Abnormal  Collection Time: 08/20/15  7:47 PM  Result Value Ref Range   WBC 9.6 4.0 - 10.5 K/uL   RBC 3.41 (L) 3.87 - 5.11 MIL/uL   Hemoglobin 10.4 (L) 12.0 - 15.0 g/dL   HCT 31.1 (L) 36.0 - 46.0 %   MCV 91.2 78.0 - 100.0 fL   MCH 30.5 26.0 - 34.0 pg   MCHC 33.4 30.0 - 36.0 g/dL   RDW 13.4 11.5 - 15.5 %   Platelets 177 150 - 400 K/uL  Differential     Status: Abnormal   Collection Time: 08/20/15  7:47 PM  Result Value Ref Range   Neutrophils Relative % 83 %   Neutro Abs 7.9 (H) 1.7 - 7.7 K/uL   Lymphocytes Relative 12 %   Lymphs Abs 1.1 0.7 - 4.0 K/uL   Monocytes Relative 4 %   Monocytes Absolute 0.4 0.1 - 1.0 K/uL   Eosinophils Relative 1 %   Eosinophils Absolute 0.1 0.0 - 0.7 K/uL   Basophils Relative 0 %   Basophils Absolute 0.0 0.0 - 0.1 K/uL  Comprehensive metabolic panel     Status: Abnormal   Collection Time: 08/20/15  7:47 PM  Result Value Ref Range   Sodium 137 135 - 145 mmol/L   Potassium 4.1 3.5 - 5.1 mmol/L   Chloride 100 (L) 101 - 111 mmol/L   CO2 27 22 - 32 mmol/L   Glucose, Bld 183 (H) 65 - 99 mg/dL   BUN 24 (H) 6 - 20 mg/dL   Creatinine, Ser 1.28 (H) 0.44 - 1.00 mg/dL   Calcium 9.7 8.9 - 10.3 mg/dL   Total Protein 8.2 (H) 6.5 - 8.1 g/dL   Albumin 4.0 3.5 - 5.0 g/dL   AST 16 15 - 41 U/L   ALT 8 (L) 14 - 54 U/L   Alkaline Phosphatase 95 38 - 126 U/L   Total Bilirubin 0.7 0.3 - 1.2 mg/dL    GFR calc non Af Amer 43 (L) >60 mL/min   GFR calc Af Amer 50 (L) >60 mL/min    Comment: (NOTE) The eGFR has been calculated using the CKD EPI equation. This calculation has not been validated in all clinical situations. eGFR's persistently <60 mL/min signify possible Chronic Kidney Disease.    Anion gap 10 5 - 15  CBG monitoring, ED     Status: Abnormal   Collection Time: 08/20/15  7:59 PM  Result Value Ref Range   Glucose-Capillary 167 (H) 65 - 99 mg/dL   Comment 1 Document in Chart   I-stat troponin, ED (not at [facility], [facility])     Status: None   Collection Time: 08/20/15  8:33 PM  Result Value Ref Range   Troponin i, poc 0.01 0.00 - 0.08 ng/mL   Comment 3            Comment: Due to the release kinetics of cTnI, a negative result within the first hours of the onset of symptoms does not rule out myocardial infarction with certainty. If myocardial infarction is still suspected, repeat the test at appropriate intervals.    Ct Angio Head W/cm &/or Wo Cm  08/20/2015  CLINICAL DATA:  65 year old female with slurred speech, flaccid left upper extremity and left lower extremity weakness. Found down at 5 p.m., thought to be normal at 4 p.m. Initial encounter. EXAM: CT ANGIOGRAPHY HEAD AND NECK TECHNIQUE: Multidetector CT imaging of the head and neck was performed using the standard protocol during bolus administration of intravenous contrast. Multiplanar CT  image reconstructions and MIPs were obtained to evaluate the vascular anatomy. Carotid stenosis measurements (when applicable) are obtained utilizing NASCET criteria, using the distal internal carotid diameter as the denominator. CONTRAST:  57m OMNIPAQUE IOHEXOL 350 MG/ML SOLN COMPARISON:  Head CT without contrast 1943 hours. FINDINGS: CTA NECK Skeleton: Absent dentition. No acute osseous abnormality identified. Osteopenia. Visualized paranasal sinuses and mastoids are clear. Other neck: Pulmonary septal thickening. No pleural effusion identified.  No superior mediastinal lymphadenopathy. Central pulmonary artery enlargement (main pulmonary artery at least 36 mm diameter). Negative thyroid, larynx, pharynx, parapharyngeal spaces, retropharyngeal space (right greater than left retropharyngeal course of the carotids), sublingual space, submandibular glands, and parotid glands. No cervical lymphadenopathy. Aortic arch: Bovine type arch configuration. Mild for age calcified arch atherosclerosis. No great vessel origin stenosis. Right carotid system: Negative right CCA proximal to the bifurcation. Calcified plaque at the bifurcation but less than 50 % stenosis with respect to the distal vessel at the right ICA origin and bulb. Tortuous partially retropharyngeal course of the right ICA which is otherwise negative to the skullbase. Left carotid system: Negative left CCA proximal to the bifurcation. Mild calcified plaque at the bifurcation in the posterior left ICA origin. No stenosis. Tortuous cervical left ICA, partially retropharyngeal. Otherwise normal to the skullbase. Vertebral arteries:No proximal subclavian artery stenosis. Mildly dominant left vertebral artery with a normal origin. Tortuous left V1 segment. Mild calcified plaque at the right vertebral artery origin with mild right vertebral origin stenosis. Otherwise negative cervical vertebral arteries. CTA HEAD Posterior circulation: Fairly codominant and negative distal vertebral arteries. Normal PICA origins. Normal vertebrobasilar junction. Normal basilar artery, SCA origins and PCA origins. Posterior communicating arteries are diminutive or absent. Bilateral PCA branches are within normal limits. Anterior circulation: Bilateral ICA siphon calcified plaque, but no definite hemodynamically significant siphon stenosis, and both ICAs are patent to the carotid termini. Normal MCA and ACA origins. Mildly dominant left A1 segment. Diminutive anterior communicating artery. Normal bilateral ACA branches. Left  MCA M1 segment, bifurcation, and left MCA branches are within normal limits. Right MCA M1 segment appears normal proximally. At the distal M1 there is irregularity and stenosis which continues to the right MCA bifurcation. The origin of the dominant posterior M2 branch is affected with a filling defect, but remains patent. This is most apparent on series 606, image 11 and series 604, image 16. Despite this the right MCA branches otherwise appear normal. Venous sinuses: Patent. Anatomic variants: Mildly dominant left vertebral artery. Delayed phase: No abnormal enhancement identified. There is perhaps subtle hypodensity in the right basal ganglia compared to the left (series 8, image 18). Otherwise no changes of acute cortically based infarction identified in the right hemisphere. No intracranial hemorrhage or mass effect. IMPRESSION: 1. Positive for Emergent Large Vessel Occlusion: Right MCA bifurcation see series 604, image 16. I am advised that the patient is status post IV tPA and this lesion appears partially recannalized. Still, I recommend consideration for Neurointerventional endovascular clot retrieval. This was discussed by telephone with Drs. SCOTT ZACKOWSKI and [facility] at 2055 hours. 2. Little to no CT changes of right MCA territory infarct are evident. No intracranial hemorrhage or mass effect. 3. Mild for age atherosclerosis. No other hemodynamically significant stenosis in the head or neck. 4. Pulmonary septal thickening may indicate mild or developing interstitial edema. There is pulmonary artery enlargement suggesting a degree of underlying pulmonary artery hypertension. Electronically Signed   By: HGenevie AnnM.D.   On:  08/20/2015 21:02   Ct Head Wo Contrast  08/20/2015  CLINICAL DATA:  LEFT-sided data set. History of cerebral vascular accident 1 year prior. EXAM: CT HEAD WITHOUT CONTRAST TECHNIQUE: Contiguous axial images were obtained from the base of the skull through the  vertex without intravenous contrast. COMPARISON:  Head CT 10/11/2011 FINDINGS: No acute intracranial hemorrhage. No focal mass lesion. No CT evidence of acute infarction. No midline shift or mass effect. No hydrocephalus. Basilar cisterns are patent. Minimal deep white matter microvascular disease. Paranasal sinuses and mastoid air cells are clear. Gas along the muscle of mastication on the RIGHT related to venous access . IMPRESSION: 1. No CT evidence of acute infarction. 2. No intracranial hemorrhage. 3. Minimal white matter microvascular disease. Findings conveyed toSCOTT ZACKOWSKI on 08/20/2015  at19:52. Electronically Signed   By: Suzy Bouchard M.D.   On: 08/20/2015 19:53   Ct Angio Neck W/cm &/or Wo/cm  08/20/2015  CLINICAL DATA:  65 year old female with slurred speech, flaccid left upper extremity and left lower extremity weakness. Found down at 5 p.m., thought to be normal at 4 p.m. Initial encounter. EXAM: CT ANGIOGRAPHY HEAD AND NECK TECHNIQUE: Multidetector CT imaging of the head and neck was performed using the standard protocol during bolus administration of intravenous contrast. Multiplanar CT image reconstructions and MIPs were obtained to evaluate the vascular anatomy. Carotid stenosis measurements (when applicable) are obtained utilizing NASCET criteria, using the distal internal carotid diameter as the denominator. CONTRAST:  1m OMNIPAQUE IOHEXOL 350 MG/ML SOLN COMPARISON:  Head CT without contrast 1943 hours. FINDINGS: CTA NECK Skeleton: Absent dentition. No acute osseous abnormality identified. Osteopenia. Visualized paranasal sinuses and mastoids are clear. Other neck: Pulmonary septal thickening. No pleural effusion identified. No superior mediastinal lymphadenopathy. Central pulmonary artery enlargement (main pulmonary artery at least 36 mm diameter). Negative thyroid, larynx, pharynx, parapharyngeal spaces, retropharyngeal space (right greater than left retropharyngeal course of the  carotids), sublingual space, submandibular glands, and parotid glands. No cervical lymphadenopathy. Aortic arch: Bovine type arch configuration. Mild for age calcified arch atherosclerosis. No great vessel origin stenosis. Right carotid system: Negative right CCA proximal to the bifurcation. Calcified plaque at the bifurcation but less than 50 % stenosis with respect to the distal vessel at the right ICA origin and bulb. Tortuous partially retropharyngeal course of the right ICA which is otherwise negative to the skullbase. Left carotid system: Negative left CCA proximal to the bifurcation. Mild calcified plaque at the bifurcation in the posterior left ICA origin. No stenosis. Tortuous cervical left ICA, partially retropharyngeal. Otherwise normal to the skullbase. Vertebral arteries:No proximal subclavian artery stenosis. Mildly dominant left vertebral artery with a normal origin. Tortuous left V1 segment. Mild calcified plaque at the right vertebral artery origin with mild right vertebral origin stenosis. Otherwise negative cervical vertebral arteries. CTA HEAD Posterior circulation: Fairly codominant and negative distal vertebral arteries. Normal PICA origins. Normal vertebrobasilar junction. Normal basilar artery, SCA origins and PCA origins. Posterior communicating arteries are diminutive or absent. Bilateral PCA branches are within normal limits. Anterior circulation: Bilateral ICA siphon calcified plaque, but no definite hemodynamically significant siphon stenosis, and both ICAs are patent to the carotid termini. Normal MCA and ACA origins. Mildly dominant left A1 segment. Diminutive anterior communicating artery. Normal bilateral ACA branches. Left MCA M1 segment, bifurcation, and left MCA branches are within normal limits. Right MCA M1 segment appears normal proximally. At the distal M1 there is irregularity and stenosis which continues to the right MCA bifurcation. The origin of the dominant posterior  M2  branch is affected with a filling defect, but remains patent. This is most apparent on series 606, image 11 and series 604, image 16. Despite this the right MCA branches otherwise appear normal. Venous sinuses: Patent. Anatomic variants: Mildly dominant left vertebral artery. Delayed phase: No abnormal enhancement identified. There is perhaps subtle hypodensity in the right basal ganglia compared to the left (series 8, image 18). Otherwise no changes of acute cortically based infarction identified in the right hemisphere. No intracranial hemorrhage or mass effect. IMPRESSION: 1. Positive for Emergent Large Vessel Occlusion: Right MCA bifurcation see series 604, image 16. I am advised that the patient is status post IV tPA and this lesion appears partially recannalized. Still, I recommend consideration for Neurointerventional endovascular clot retrieval. This was discussed by telephone with Drs. SCOTT ZACKOWSKI and Thurnell Garbe in the [facility] ED on 08/20/2015 at 2055 hours. 2. Little to no CT changes of right MCA territory infarct are evident. No intracranial hemorrhage or mass effect. 3. Mild for age atherosclerosis. No other hemodynamically significant stenosis in the head or neck. 4. Pulmonary septal thickening may indicate mild or developing interstitial edema. There is pulmonary artery enlargement suggesting a degree of underlying pulmonary artery hypertension. Electronically Signed   By: Genevie Ann M.D.   On: 08/20/2015 21:02    Assessment: 65 y.o. female with multiple risk factors for stroke presenting with acute right MCA territory ischemic infarction with MCA thrombus demonstrated on CT angiogram and branch occlusion demonstrated on catheter cerebral arteriogram. Patient was given TPA with slight improvement in left upper extremity strength noted. MCA distal branch occlusion noted on catheter angiogram was not amenable to thrombectomy, and patient was beyond time window on arrival for use of intra-arterial  TPA.  Stroke Risk Factors - diabetes mellitus and hypertension  Plan: 1. Post TPA management protocol and neuro intensive care unit 2. MRI  of the brain without contrast 3. PT consult, OT consult, Speech consult 4. Echocardiogram 5. Prophylactic therapy-Antiplatelet med: Aspirin  6. Risk factor modification 7. HgbA1c, fasting lipid panel  This patient is critically ill and at significant risk of neurological worsening or death, and care requires constant monitoring of vital signs, hemodynamics,respiratory and cardiac monitoring, neurological assessment, discussion with family, other specialists and medical decision making of high complexity. Total critical care time was 60 minutes.  C.R. Nicole Kindred, MD [facility] [phone]  08/20/2015, 11:05 PM

## 2015-08-20 NOTE — ED Provider Notes (Signed)
CSN: [id number]     Arrival date & time 08/20/15  1934 History   First MD Initiated Contact with Patient 08/20/15 1943     Chief Complaint  Patient presents with  . Code Stroke     (Consider location/radiation/quality/duration/timing/severity/associated sxs/prior Treatment) The history is provided by the patient, the EMS personnel and the spouse.   65 year old female last seen normal by her husband at 4:00 in the afternoon. He went to run some errands that lasted about an hour and a half. When he returned she was on the floor not able to move her left arm heard left side of her face was drooping she was having slurred speech and trouble speaking. Seemed to be weak in the left leg. EMS called.  Past Medical History  Diagnosis Date  . HTN (hypertension)   . DM (diabetes mellitus) (HCC)   . Anxiety   . Depression   . Obesity   . Colon polyps   . Complication of anesthesia   . PONV (postoperative nausea and vomiting)   . Agoraphobia with panic disorder   . Chronic pain 03/08/15    Hydrocodone-APAP 10/325 mg, # 120 q month   Past Surgical History  Procedure Laterality Date  . Cardiac catheterization  2004    normal  . S/p hysterectomy      complete  . Abdominal hysterectomy  15 yrs ago    with bso at aph  . Cholecystectomy  25 yrs ago    [facility]  . Colonoscopy  05/04/11    left-sided colonic diverticulosis  . Esophagogastroduodenoscopy  05/04/2011    Abnormal distal esophagus, biopsies consistent with inflammation due to acid reflux. No evidence of Barrett's. No H. pylori   Family History  Problem Relation Age of Onset  . Colon cancer Paternal Aunt     2, colon cancer  . Colon cancer Paternal Uncle     6, colon cancer  . Colon cancer Father     deceased, age 5  . Colon cancer      numerous cousins died before age 75 with colon cancer  . Colon cancer Sister     deceased, age 81  . Anesthesia problems Neg Hx   . Hypotension Neg Hx   . Malignant hyperthermia Neg Hx   .  Pseudochol deficiency Neg Hx    Social History  Substance Use Topics  . Smoking status: Former Smoker -- 0.25 packs/day for 15 years    Types: Cigarettes  . Smokeless tobacco: None     Comment: one pack per month, never more than 1-2 cig/day in entire life  . Alcohol Use: No   OB History    No data available     Review of Systems  Unable to perform ROS: Mental status change  Neurological: Positive for speech difficulty and weakness.      Allergies  Naproxen  Home Medications   Prior to Admission medications   Medication Sig Start Date End Date Taking? Authorizing Provider  atorvastatin (LIPITOR) 20 MG tablet Take 20 mg by mouth at bedtime.     Historical Provider, MD  cyclobenzaprine (FLEXERIL) 10 MG tablet Take 1 tablet (10 mg total) by mouth 2 (two) times daily as needed for muscle spasms. 07/17/15   Marily Memos, MD  diazepam (VALIUM) 10 MG tablet Take 10 mg by mouth at bedtime as needed. For sleep    Historical Provider, MD  enalapril (VASOTEC) 20 MG tablet Take 20 mg by mouth 2 (two) times daily.  Historical Provider, MD  glipiZIDE (GLUCOTROL) 10 MG tablet Take 10 mg by mouth every morning. In the AM    Historical Provider, MD  hydrochlorothiazide (HYDRODIURIL) 25 MG tablet Take 25 mg by mouth daily.      Historical Provider, MD  HYDROcodone-acetaminophen (NORCO) 10-325 MG per tablet Take 1 tablet by mouth every 6 (six) hours as needed for moderate pain.    Historical Provider, MD  ibuprofen (ADVIL,MOTRIN) 200 MG tablet Take 400 mg by mouth every 6 (six) hours as needed for headache.    Historical Provider, MD  levothyroxine (SYNTHROID, LEVOTHROID) 25 MCG tablet Take 25 mcg by mouth daily. 07/14/15   Historical Provider, MD  metFORMIN (GLUCOPHAGE) 500 MG tablet Take 1,000 mg by mouth 2 (two) times daily with a meal.     Historical Provider, MD  metoprolol (TOPROL-XL) 50 MG 24 hr tablet Take 50 mg by mouth at bedtime.     Historical Provider, MD  oxyCODONE-acetaminophen  (PERCOCET/ROXICET) 5-325 MG tablet Take 1 tablet by mouth every 4 (four) hours as needed. 07/17/15   Marily Memos, MD  pioglitazone (ACTOS) 15 MG tablet Take 15 mg by mouth daily.    Historical Provider, MD  traZODone (DESYREL) 150 MG tablet Take 150 mg by mouth at bedtime. 07/14/15   Historical Provider, MD   BP 158/71 mmHg  Pulse 69  Temp(Src) 97.7 F (36.5 C) (Oral)  Resp 20  Wt 90 kg  SpO2 91% Physical Exam  Constitutional: She appears well-developed and well-nourished. No distress.  HENT:  Head: Normocephalic and atraumatic.  Mouth/Throat: Oropharynx is clear and moist.  Eyes: Conjunctivae and EOM are normal. Pupils are equal, round, and reactive to light.  Neck: Normal range of motion. Neck supple.  Cardiovascular: Normal rate, regular rhythm and normal heart sounds.   No murmur heard. Pulmonary/Chest: Effort normal and breath sounds normal. No respiratory distress.  Abdominal: Soft. Bowel sounds are normal. There is no tenderness.  Musculoskeletal: Normal range of motion.  Neurological: She is alert. A cranial nerve deficit is present.  Slurred speech. Paralysis of left arm weakness of left lower extremity left facial droop.  Skin: Skin is warm.  Nursing note and vitals reviewed.   ED Course  Procedures (including critical care time) Labs Review Labs Reviewed  CBC - Abnormal; Notable for the following:    RBC 3.41 (*)    Hemoglobin 10.4 (*)    HCT 31.1 (*)    All other components within normal limits  DIFFERENTIAL - Abnormal; Notable for the following:    Neutro Abs 7.9 (*)    All other components within normal limits  COMPREHENSIVE METABOLIC PANEL - Abnormal; Notable for the following:    Chloride 100 (*)    Glucose, Bld 183 (*)    BUN 24 (*)    Creatinine, Ser 1.28 (*)    Total Protein 8.2 (*)    ALT 8 (*)    GFR calc non Af Amer 43 (*)    GFR calc Af Amer 50 (*)    All other components within normal limits  CBG MONITORING, ED - Abnormal; Notable for the  following:    Glucose-Capillary 167 (*)    All other components within normal limits  ETHANOL  PROTIME-INR  APTT  URINE RAPID DRUG SCREEN, HOSP PERFORMED  URINALYSIS, ROUTINE W REFLEX MICROSCOPIC (NOT AT [facility])  I-STAT TROPOININ, ED   Results for orders placed or performed during the hospital encounter of 08/20/15  Ethanol  Result Value Ref Range  Alcohol, Ethyl (B) <5 <5 mg/dL  Protime-INR  Result Value Ref Range   Prothrombin Time 14.4 11.6 - 15.2 seconds   INR 1.10 0.00 - 1.49  APTT  Result Value Ref Range   aPTT 31 24 - 37 seconds  CBC  Result Value Ref Range   WBC 9.6 4.0 - 10.5 K/uL   RBC 3.41 (L) 3.87 - 5.11 MIL/uL   Hemoglobin 10.4 (L) 12.0 - 15.0 g/dL   HCT 11.9 (L) 14.7 - 82.9 %   MCV 91.2 78.0 - 100.0 fL   MCH 30.5 26.0 - 34.0 pg   MCHC 33.4 30.0 - 36.0 g/dL   RDW 56.2 13.0 - 86.5 %   Platelets 177 150 - 400 K/uL  Differential  Result Value Ref Range   Neutrophils Relative % 83 %   Neutro Abs 7.9 (H) 1.7 - 7.7 K/uL   Lymphocytes Relative 12 %   Lymphs Abs 1.1 0.7 - 4.0 K/uL   Monocytes Relative 4 %   Monocytes Absolute 0.4 0.1 - 1.0 K/uL   Eosinophils Relative 1 %   Eosinophils Absolute 0.1 0.0 - 0.7 K/uL   Basophils Relative 0 %   Basophils Absolute 0.0 0.0 - 0.1 K/uL  Comprehensive metabolic panel  Result Value Ref Range   Sodium 137 135 - 145 mmol/L   Potassium 4.1 3.5 - 5.1 mmol/L   Chloride 100 (L) 101 - 111 mmol/L   CO2 27 22 - 32 mmol/L   Glucose, Bld 183 (H) 65 - 99 mg/dL   BUN 24 (H) 6 - 20 mg/dL   Creatinine, Ser 7.84 (H) 0.44 - 1.00 mg/dL   Calcium 9.7 8.9 - 69.6 mg/dL   Total Protein 8.2 (H) 6.5 - 8.1 g/dL   Albumin 4.0 3.5 - 5.0 g/dL   AST 16 15 - 41 U/L   ALT 8 (L) 14 - 54 U/L   Alkaline Phosphatase 95 38 - 126 U/L   Total Bilirubin 0.7 0.3 - 1.2 mg/dL   GFR calc non Af Amer 43 (L) >60 mL/min   GFR calc Af Amer 50 (L) >60 mL/min   Anion gap 10 5 - 15  I-stat troponin, ED (not at [facility])  Result Value Ref Range   Troponin i,  poc 0.01 0.00 - 0.08 ng/mL   Comment 3          CBG monitoring, ED  Result Value Ref Range   Glucose-Capillary 167 (H) 65 - 99 mg/dL   Comment 1 Document in Chart      Imaging Review Ct Head Wo Contrast  08/20/2015  CLINICAL DATA:  LEFT-sided data set. History of cerebral vascular accident 1 year prior. EXAM: CT HEAD WITHOUT CONTRAST TECHNIQUE: Contiguous axial images were obtained from the base of the skull through the vertex without intravenous contrast. COMPARISON:  Head CT 10/11/2011 FINDINGS: No acute intracranial hemorrhage. No focal mass lesion. No CT evidence of acute infarction. No midline shift or mass effect. No hydrocephalus. Basilar cisterns are patent. Minimal deep white matter microvascular disease. Paranasal sinuses and mastoid air cells are clear. Gas along the muscle of mastication on the RIGHT related to venous access . IMPRESSION: 1. No CT evidence of acute infarction. 2. No intracranial hemorrhage. 3. Minimal white matter microvascular disease. Findings conveyed toSCOTT Fotios Amos on 08/20/2015  at19:52. Electronically Signed   By: Genevive Bi M.D.   On: 08/20/2015 19:53   I have personally reviewed and evaluated these images and lab results as part of my  medical decision-making.   EKG Interpretation   Date/Time:  Friday August 20 2015 19:54:32 EST Ventricular Rate:  71 PR Interval:  171 QRS Duration: 100 QT Interval:  457 QTC Calculation: 497 R Axis:   88 Text Interpretation:  Sinus rhythm Borderline right axis deviation  Borderline prolonged QT interval Confirmed by Tenzin Pavon  MD, Chapman Matteucci [phone])  on 08/20/2015 7:57:25 PM      CRITICAL CARE Performed by: Vanetta Mulders Total critical care time: 60 minutes Critical care time was exclusive of separately billable procedures and treating other patients. Critical care was necessary to treat or prevent imminent or life-threatening deterioration. Critical care was time spent personally by me on the following  activities: development of treatment plan with patient and/or surrogate as well as nursing, discussions with consultants, evaluation of patient's response to treatment, examination of patient, obtaining history from patient or surrogate, ordering and performing treatments and interventions, ordering and review of laboratory studies, ordering and review of radiographic studies, pulse oximetry and re-evaluation of patient's condition.      MDM   Final diagnoses:  Cerebral infarction due to unspecified mechanism    patient last seen normal at 4:00 in the afternoon. Patient of arrived here at stroke activated. Seen by her tell neurologist. The patient was a candidate for TPA. TPA was started prior to the 4.5 hour mark. CT scan of the head showed no evidence of any acute bleed. Patient had a significant deficit to include left facial droop flaccid left arm and weakness in the left leg. Concern was for a right MCA infarct. Soon as TPA was hung patient went to CT angiogram of the head and also had neck done. Shows a right MCA occlusion. [facility]ist [facility]. Vonita Moss is activated code stroke put them down there and he will contact interventional radiology. Patient be transported to the [facility].  Following the onset of the TPA patient has a little bit increased movement of the left arm when she had none before. Patient so far as tolerated TPA fine.    Vanetta Mulders, MD 08/20/15 2144

## 2015-08-20 NOTE — Transfer of Care (Signed)
Immediate Anesthesia Transfer of Care Note  Patient: Megan [name]  Procedure(s) Performed: Procedure(s): RADIOLOGY WITH ANESTHESIA (N/A)  Patient Location: NICU  Anesthesia Type:MAC  Level of Consciousness: awake and alert   Airway & Oxygen Therapy: Patient connected to nasal cannula oxygen  Post-op Assessment: Report given to RN and Post -op Vital signs reviewed and stable  Post vital signs: Reviewed and stable  Last Vitals:  Filed Vitals:   08/20/15 2148 08/20/15 2151  BP: 147/91 123/67  Pulse:  74  Temp:    Resp:  16    Complications: No apparent anesthesia complications

## 2015-08-21 ENCOUNTER — Inpatient Hospital Stay (HOSPITAL_COMMUNITY): Payer: Commercial Managed Care - HMO

## 2015-08-21 DIAGNOSIS — G8194 Hemiplegia, unspecified affecting left nondominant side: Secondary | ICD-10-CM

## 2015-08-21 DIAGNOSIS — I63511 Cerebral infarction due to unspecified occlusion or stenosis of right middle cerebral artery: Secondary | ICD-10-CM

## 2015-08-21 LAB — LIPID PANEL
CHOL/HDL RATIO: 2.7 ratio
Cholesterol: 117 mg/dL (ref 0–200)
HDL: 43 mg/dL (ref >40)
LDL Cholesterol: 57 mg/dL (ref 0–99)
Triglycerides: 85 mg/dL (ref <150)
VLDL: 17 mg/dL (ref 0–40)

## 2015-08-21 LAB — MRSA PCR SCREENING: MRSA BY PCR: NEGATIVE

## 2015-08-21 MED ORDER — SODIUM CHLORIDE 0.9 % IV SOLN
INTRAVENOUS | Status: DC
  Administered 2015-08-23: 15:00:00 via INTRAVENOUS

## 2015-08-21 MED ORDER — HYDROMORPHONE HCL 1 MG/ML IJ SOLN
1.0000 mg | INTRAMUSCULAR | Status: DC | PRN
  Administered 2015-08-21 – 2015-08-23 (×6): 1 mg via INTRAVENOUS
  Filled 2015-08-21 (×6): qty 1

## 2015-08-21 MED ORDER — CHLORHEXIDINE GLUCONATE 0.12 % MT SOLN
15.0000 mL | Freq: Two times a day (BID) | OROMUCOSAL | Status: DC
  Administered 2015-08-21 – 2015-08-24 (×7): 15 mL via OROMUCOSAL
  Filled 2015-08-21 (×4): qty 15

## 2015-08-21 MED ORDER — ACETAMINOPHEN 325 MG PO TABS: 650.0000 mg | ORAL_TABLET | ORAL | Status: DC | PRN

## 2015-08-21 MED ORDER — CETYLPYRIDINIUM CHLORIDE 0.05 % MT LIQD
7.0000 mL | Freq: Two times a day (BID) | OROMUCOSAL | Status: DC
  Administered 2015-08-21 – 2015-08-23 (×3): 7 mL via OROMUCOSAL

## 2015-08-21 MED ORDER — SODIUM CHLORIDE 0.9 % IV SOLN
INTRAVENOUS | Status: DC
Start: 2015-08-21 — End: 2015-08-23
  Administered 2015-08-21 (×2): via INTRAVENOUS

## 2015-08-21 MED ORDER — ACETAMINOPHEN 650 MG RE SUPP: 650.0000 mg | RECTAL | Status: DC | PRN

## 2015-08-21 MED ORDER — LABETALOL HCL 5 MG/ML IV SOLN
10.0000 mg | INTRAVENOUS | Status: DC | PRN
  Filled 2015-08-21 (×2): qty 4

## 2015-08-21 MED ORDER — ONDANSETRON HCL 4 MG/2ML IJ SOLN
4.0000 mg | Freq: Four times a day (QID) | INTRAMUSCULAR | Status: DC | PRN
  Administered 2015-08-21 – 2015-08-23 (×4): 4 mg via INTRAVENOUS
  Filled 2015-08-21 (×4): qty 2

## 2015-08-21 MED ORDER — PANTOPRAZOLE SODIUM 40 MG IV SOLR
40.0000 mg | Freq: Every day | INTRAVENOUS | Status: DC
  Administered 2015-08-21 (×2): 40 mg via INTRAVENOUS
  Filled 2015-08-21 (×2): qty 40

## 2015-08-21 MED ORDER — STROKE: EARLY STAGES OF RECOVERY BOOK
Freq: Once | Status: AC
  Administered 2015-08-21
  Filled 2015-08-21: qty 1

## 2015-08-21 NOTE — Progress Notes (Signed)
Subjective: Patient complained of headache and left knee pain. Strength of left extremities, particularly left upper extremity. Status post TPA and interventional radiology with thrombectomy 1 08/20/2015.  Objective: Current vital signs: BP 140/69 mmHg  Pulse 84  Temp(Src) 99.8 F (37.7 C) (Oral)  Resp 25  Ht  (1.575 m)  Wt 91.5 kg (201 lb 11.5 oz)  BMI 36.89 kg/m2  SpO2 95%  Physical Exam: HEENT: Unremarkable Neck was supple with no tenderness Chest was clear to auscultation Heart rate and rhythm were normal. Heart sounds were normal. Abdomen was soft and nontender with normal bowel sounds. Extremities: moderate tenderness of left knee with equivocal mild edema.  Neurologic Exam: Alert and in no acute distress. Vital status was normal. Extraocular movements were full and conjugate. Moderate left lower facial weakness noted. Speech was slightly dysarthric. No drift of left upper extremity with good strength proximally and distally. Equivocal proximal weakness versus guarding of left lower extremity; distal left lower extremity strength was normal.  MRI showed right MCA territory infarction with basal ganglia hematomas and right insula petechial hemorrhage. Acute ischemia was also an involving left cerebellum and right parietal regions.  Echocardiogram showed 60-65% LVEF and restrictive diastolic filling with increased filling pressures. Left atrium was moderately enlarged.  Lipid panel was normal. Hemoglobin A1c results pending.  Medications: I have reviewed the patient's current medications.  Assessment/Plan: 65 year old lady with multiple risk factors for stroke who presented with acute right MCA ischemic infarction with right MCA thrombus demonstrated on CT angiogram. Patient is treated with TPA and subsequently underwent thrombectomy in interventional radiology, with improvement of left-sided weakness as well as speech.  Plan: 1. Will defer initiation of  antiplatelet treatment of hematomas demonstrated in right basal ganglia 2. OT, PT and SLP evaluations 3. Follow-up management per stroke team during on 08/22/2015.  C.R. Roseanne Reno, MD [facility] [phone]  08/21/2015  10:01 PM

## 2015-08-21 NOTE — Progress Notes (Signed)
Pt arrived at 2345 from IR. Vitals taken, post TPA assessment completed, see documentation.

## 2015-08-21 NOTE — Anesthesia Postprocedure Evaluation (Signed)
Anesthesia Post Note  Patient: Megan [name]  Procedure(s) Performed: Procedure(s) (LRB): RADIOLOGY WITH ANESTHESIA (N/A)  Patient location during evaluation: PACU Anesthesia Type: MAC Level of consciousness: awake and alert Pain management: pain level controlled Vital Signs Assessment: post-procedure vital signs reviewed and stable Respiratory status: spontaneous breathing, nonlabored ventilation, respiratory function stable and patient connected to nasal cannula oxygen Cardiovascular status: stable and blood pressure returned to baseline Anesthetic complications: no    Last Vitals:  Filed Vitals:   08/20/15 2345 08/21/15 0000  BP: 123/94 147/85  Pulse: 65 62  Temp:  36.7 C  Resp: 14 18    Last Pain: There were no vitals filed for this visit.               Laquinton Bihm,W. EDMOND

## 2015-08-21 NOTE — Progress Notes (Signed)
08/21/15  Neuro IR Rt 5 french femoral sheath removed at 1115. Exoseal closure device and manual pressure used to achieve hemostasis at 1130.  Distal pulses intact.  Pressure dressing and sandbag applied to site. Groin site was viewed by Aida Puffer.  Marcoantonio Legault RT-R Lynann Beaver RT-R

## 2015-08-22 ENCOUNTER — Inpatient Hospital Stay (HOSPITAL_COMMUNITY): Payer: Commercial Managed Care - HMO

## 2015-08-22 DIAGNOSIS — R51 Headache: Secondary | ICD-10-CM

## 2015-08-22 DIAGNOSIS — R519 Headache, unspecified: Secondary | ICD-10-CM | POA: Insufficient documentation

## 2015-08-22 DIAGNOSIS — M25569 Pain in unspecified knee: Secondary | ICD-10-CM | POA: Insufficient documentation

## 2015-08-22 DIAGNOSIS — M25562 Pain in left knee: Secondary | ICD-10-CM

## 2015-08-22 DIAGNOSIS — I6789 Other cerebrovascular disease: Secondary | ICD-10-CM

## 2015-08-22 DIAGNOSIS — I639 Cerebral infarction, unspecified: Secondary | ICD-10-CM

## 2015-08-22 LAB — COMPREHENSIVE METABOLIC PANEL
ALT: 7 U/L — ABNORMAL LOW (ref 14–54)
ANION GAP: 12 (ref 5–15)
AST: 14 U/L — ABNORMAL LOW (ref 15–41)
Albumin: 3.4 g/dL — ABNORMAL LOW (ref 3.5–5.0)
Alkaline Phosphatase: 85 U/L (ref 38–126)
BILIRUBIN TOTAL: 0.9 mg/dL (ref 0.3–1.2)
BUN: 9 mg/dL (ref 6–20)
CHLORIDE: 97 mmol/L — AB (ref 101–111)
CO2: 26 mmol/L (ref 22–32)
Calcium: 9.4 mg/dL (ref 8.9–10.3)
Creatinine, Ser: 0.87 mg/dL (ref 0.44–1.00)
Glucose, Bld: 193 mg/dL — ABNORMAL HIGH (ref 65–99)
POTASSIUM: 3.5 mmol/L (ref 3.5–5.1)
Sodium: 135 mmol/L (ref 135–145)
TOTAL PROTEIN: 6.7 g/dL (ref 6.5–8.1)

## 2015-08-22 LAB — CBC
HCT: 30.4 % — ABNORMAL LOW (ref 36.0–46.0)
Hemoglobin: 10.3 g/dL — ABNORMAL LOW (ref 12.0–15.0)
MCH: 30.5 pg (ref 26.0–34.0)
MCHC: 33.9 g/dL (ref 30.0–36.0)
MCV: 89.9 fL (ref 78.0–100.0)
PLATELETS: 186 10*3/uL (ref 150–400)
RBC: 3.38 MIL/uL — ABNORMAL LOW (ref 3.87–5.11)
RDW: 13.4 % (ref 11.5–15.5)
WBC: 11 10*3/uL — AB (ref 4.0–10.5)

## 2015-08-22 LAB — URINALYSIS, ROUTINE W REFLEX MICROSCOPIC
BILIRUBIN URINE: NEGATIVE
Glucose, UA: 100 mg/dL — AB
KETONES UR: NEGATIVE mg/dL
LEUKOCYTES UA: NEGATIVE
NITRITE: NEGATIVE
PH: 6.5 (ref 5.0–8.0)
Protein, ur: NEGATIVE mg/dL
SPECIFIC GRAVITY, URINE: 1.007 (ref 1.005–1.030)

## 2015-08-22 LAB — URINE MICROSCOPIC-ADD ON: BACTERIA UA: NONE SEEN

## 2015-08-22 LAB — RAPID URINE DRUG SCREEN, HOSP PERFORMED
AMPHETAMINES: NOT DETECTED
Barbiturates: NOT DETECTED
Benzodiazepines: POSITIVE — AB
Cocaine: NOT DETECTED
OPIATES: POSITIVE — AB
Tetrahydrocannabinol: NOT DETECTED

## 2015-08-22 MED ORDER — HYDROCODONE-ACETAMINOPHEN 5-325 MG PO TABS
1.0000 | ORAL_TABLET | Freq: Four times a day (QID) | ORAL | Status: DC | PRN
  Administered 2015-08-22 (×3): 2 via ORAL
  Administered 2015-08-23 (×2): 1 via ORAL
  Filled 2015-08-22 (×2): qty 2
  Filled 2015-08-22 (×2): qty 1
  Filled 2015-08-22 (×2): qty 2

## 2015-08-22 MED ORDER — LABETALOL HCL 5 MG/ML IV SOLN
10.0000 mg | Freq: Once | INTRAVENOUS | Status: AC
  Administered 2015-08-22: 10 mg via INTRAVENOUS

## 2015-08-22 MED ORDER — PANTOPRAZOLE SODIUM 40 MG PO TBEC
40.0000 mg | DELAYED_RELEASE_TABLET | Freq: Every day | ORAL | Status: DC
  Administered 2015-08-22 – 2015-08-24 (×3): 40 mg via ORAL
  Filled 2015-08-22 (×3): qty 1

## 2015-08-22 MED ORDER — METOPROLOL TARTRATE 25 MG PO TABS
25.0000 mg | ORAL_TABLET | Freq: Two times a day (BID) | ORAL | Status: DC
  Administered 2015-08-22 – 2015-08-23 (×3): 25 mg via ORAL
  Filled 2015-08-22 (×3): qty 1

## 2015-08-22 MED ORDER — METFORMIN HCL 500 MG PO TABS
500.0000 mg | ORAL_TABLET | Freq: Two times a day (BID) | ORAL | Status: DC
  Administered 2015-08-22 – 2015-08-23 (×2): 500 mg via ORAL
  Filled 2015-08-22 (×2): qty 1

## 2015-08-22 MED ORDER — GLIPIZIDE 10 MG PO TABS
10.0000 mg | ORAL_TABLET | Freq: Every day | ORAL | Status: DC
  Administered 2015-08-23: 10 mg via ORAL
  Filled 2015-08-22 (×2): qty 1

## 2015-08-22 NOTE — Evaluation (Signed)
Physical Therapy Evaluation Patient Details Name: Megan [name] MRN: [id number] DOB: [birth year] Today's Date: 08/22/2015   History of Present Illness  Megan [name] is an 65 y.o. female with a history of diabetes mellitus, hypertension and obesity who was seen in the ED at [facility] following acute onset of left facial droop, slurred speech and left-sided weakness. Received tPA and had thrombectomy; Of note, she fell on her L knee and is in a considerable amount of pain; X-ray negative for fx  Clinical Impression   Pt admitted with above diagnosis. Pt currently with functional limitations due to the deficits listed below (see PT Problem List).  Pt will benefit from skilled PT to increase their independence and safety with mobility to allow discharge to the venue listed below.    At this point, her L knee pain is significantly decreasing her functional mobility; It may be worth considering consulting Orthopedics to take a closer look at her knee     Follow Up Recommendations Home health PT;Supervision/Assistance - 24 hour    Equipment Recommendations  Rolling walker with 5" wheels;3in1 (PT) (will consider wheelchair)    Recommendations for Other Services OT consult;Other (comment) (Consider Ortho consult for a closer look at her knee)     Precautions / Restrictions Precautions Precautions: Fall Restrictions Weight Bearing Restrictions: No      Mobility  Bed Mobility Overal bed mobility: Needs Assistance Bed Mobility: Supine to Sit     Supine to sit: Min assist;HOB elevated     General bed mobility comments: Cues for technqiue; and min assist to support LLE coming off of the bed  Transfers Overall transfer level: Needs assistance Equipment used: Rolling walker (2 wheeled) Transfers: Sit to/from Stand Sit to Stand: Min assist         General transfer comment: Cues for hand placement and safety; Good rise, mostly on RLE; min assist for  safety  Ambulation/Gait Ambulation/Gait assistance: Min guard;+2 safety/equipment Ambulation Distance (Feet):  (pivotal steps bed to recliner) Assistive device: Rolling walker (2 wheeled) Gait Pattern/deviations: Shuffle     General Gait Details: Cues to push down into RW and take pressure off of painful LLE  Stairs            Wheelchair Mobility    Modified Rankin (Stroke Patients Only)       Balance                                             Pertinent Vitals/Pain Pain Assessment: 0-10 Pain Score: 8  Pain Location: L knee, especailly with flexion Pain Descriptors / Indicators: Aching;Grimacing Pain Intervention(s): Limited activity within patient's tolerance;Monitored during session    Home Living Family/patient expects to be discharged to:: Private residence Living Arrangements: Spouse/significant other Available Help at Discharge: Family;Available 24 hours/day Type of Home: House Home Access: Stairs to enter Entrance Stairs-Rails: Doctor, general practice of Steps: 6 Home Layout: One level Home Equipment: None      Prior Function Level of Independence: Independent               Hand Dominance        Extremity/Trunk Assessment   Upper Extremity Assessment: Defer to OT evaluation           Lower Extremity Assessment: LLE deficits/detail   LLE Deficits / Details: Grossly decr aROM and strength, limited by knee pain with  ROM or weight bearing; noted swelling  of the knee, especially suprapatellar; X-ray neg for fracture     Communication   Communication: No difficulties  Cognition Arousal/Alertness: Awake/alert Behavior During Therapy: WFL for tasks assessed/performed Overall Cognitive Status: Within Functional Limits for tasks assessed                      General Comments      Exercises        Assessment/Plan    PT Assessment Patient needs continued PT services  PT Diagnosis Difficulty  walking;Acute pain;Abnormality of gait   PT Problem List Decreased strength;Decreased range of motion;Decreased activity tolerance;Decreased balance;Decreased mobility;Decreased knowledge of use of DME;Decreased safety awareness;Pain  PT Treatment Interventions DME instruction;Gait training;Stair training;Functional mobility training;Therapeutic activities;Therapeutic exercise;Balance training;Patient/family education   PT Goals (Current goals can be found in the Care Plan section) Acute Rehab PT Goals Patient Stated Goal: did not state PT Goal Formulation: With patient Time For Goal Achievement: 09/05/15 Potential to Achieve Goals: Good    Frequency Min 4X/week   Barriers to discharge Inaccessible home environment 6-8 steps to enter    Co-evaluation               End of Session Equipment Utilized During Treatment: Gait belt Activity Tolerance: Patient limited by pain Patient left: in chair;with call bell/phone within reach;with chair alarm set Nurse Communication: Mobility status         Time: 1610-9604 PT Time Calculation (min) (ACUTE ONLY): 21 min   Charges:   PT Evaluation $PT Eval Moderate Complexity: 1 Procedure     PT G CodesVan Clines Hamff 08/22/2015, 1:03 PM  Van Clines, [place]  Acute Rehabilitation Services Pager [phone] Office [phone]

## 2015-08-22 NOTE — Progress Notes (Addendum)
STROKE TEAM PROGRESS NOTE   HISTORY AT THE TIME OF ADMISSION Megan [name] is an 65 y.o. female with a history of diabetes mellitus, hypertension and obesity who was seen in the ED at [facility] following acute onset of left facial droop, slurred speech and left-sided weakness. She was last known well at 4 PM today. Family found her on the floor home at 5 PM. CT scan of her head showed no acute intracranial abnormality. Teleneurologist was consulted and recommended TPA which was administered. There was improvement in right of left upper extremity but no other noticeable improvements. CT angiogram showed thrombus involving right middle cerebral artery with partial occlusion. Patient was transferred to [facility] for further management, including interventional radiology procedures. Angiogram showed occlusion of a distal MCA branch which was not amenable to thrombectomy. Patient was beyond time window for use of intra-arterial TPA. She was admitted to neuro intensive care unit for further management.    Procedures   CEREBRAL ANGIOGRAM [ZOX0960 (Custom)]      Expand All Collapse All   S/P 4 vessel cerebral arteriogram  Rt CFa approach. Findings. 1.Distal RT MCA angular branch clot migration ,into M3-M4 region      LSN: 4 PM on 08/20/2015 tPA Given: Yes 08/21/2015 at 2014 mRankin:   SUBJECTIVE (INTERVAL HISTORY) Her family is not at the bedside.  Overall she feels her condition is controlled. RN reports that patient converted into atrial fibrillation overnight.  Patient complaining of HA and left knee pain   OBJECTIVE Temp:  [97.7 F (36.5 C)-99.8 F (37.7 C)] 97.7 F (36.5 C) (02/05 0400) Pulse Rate:  [70-137] 113 (02/05 0700) Cardiac Rhythm:  [-] Normal sinus rhythm (02/04 2000) Resp:  [13-25] 21 (02/05 0700) BP: (84-162)/(44-101) 151/68 mmHg (02/05 0700) SpO2:  [92 %-100 %] 98 % (02/05 0700) Arterial Line BP: (118-148)/(52-67) 144/65 mmHg (02/04 1100)  CBC:  Recent Labs Lab  08/20/15 1947  WBC 9.6  NEUTROABS 7.9*  HGB 10.4*  HCT 31.1*  MCV 91.2  PLT 177    Basic Metabolic Panel:  Recent Labs Lab 08/20/15 1947  NA 137  K 4.1  CL 100*  CO2 27  GLUCOSE 183*  BUN 24*  CREATININE 1.28*  CALCIUM 9.7    Lipid Panel:    Component Value Date/Time   CHOL 117 08/21/2015 0435   TRIG 85 08/21/2015 0435   HDL 43 08/21/2015 0435   CHOLHDL 2.7 08/21/2015 0435   VLDL 17 08/21/2015 0435   LDLCALC 57 08/21/2015 0435   HgbA1c: No results found for: HGBA1C Urine Drug Screen: No results found for: LABOPIA, COCAINSCRNUR, LABBENZ, AMPHETMU, THCU, LABBARB    IMAGING  Ct Angio Head Neck W/cm &/or Wo Cm 08/20/2015   1. Positive for Emergent Large Vessel Occlusion: Right MCA bifurcation see series 604, image 16. I am advised that the patient is status post IV tPA and this lesion appears partially recannalized. Still, I recommend consideration for Neurointerventional endovascular clot retrieval.   2. Little to no CT changes of right MCA territory infarct are evident. No intracranial hemorrhage or mass effect.  3. Mild for age atherosclerosis. No other hemodynamically significant stenosis in the head or neck.  4. Pulmonary septal thickening may indicate mild or developing interstitial edema. There is pulmonary artery enlargement suggesting a degree of underlying pulmonary artery hypertension.    Ct Head Wo Contrast 08/20/2015   1. No CT evidence of acute infarction.  2. No intracranial hemorrhage.  3. Minimal white matter microvascular disease.  Mr Brain Wo Contrast 08/21/2015   Evolving moderate RIGHT MCA territory infarct ; basal ganglia hematomas, petechial hemorrhage RIGHT insula.  Local mass effect without midline shift.  Additional foci of acute ischemia LEFT cerebellum, RIGHT parietal lobe (watershed).  Moderate chronic small vessel ischemic disease.   PHYSICAL EXAM HEENT- Normocephalic, no lesions, without obvious abnormality. Normal external eye and  conjunctiva. Neck supple with no masses, nodes, nodules or enlargement. Cardiovascular - irregular rate and rhythm  Lungs - chest clear, no wheezing, rales, normal symmetric air entry Abdomen - soft, non-tender; bowel sounds normal; no masses, no organomegaly Extremities - no joint deformities, effusion, or inflammation and no edema  Neurologic Examination: Patient was lethargic but able to follow commands and oriented x3.  She is in no acute distress. Dsarthria also noted.  Pupils were equal and reacted normally to light. Extraocular movements were full. Patient had a gaze preference to the right side, however can cross midline. Left homonymous hemianopsia Left facial weakness  Motor exam showed moderately severe weakness of left upper extremity proximally and distally, as well as moderate weakness proximally of left lower extremity. Strength of right extremities was normal.  Sensory Exam:  Patient reports decrease sensation to both lower extremities (left greater than right)  Coordination/Gait: deferred   ASSESSMENT/PLAN Megan [name] is a 65 y.o. female with history of hypertension, diabetes mellitus, anxiety, depression, obesity, agorophobia with panic disorder, and chronic pain presenting with left facial droop, slurred speech, and left hemiparesis.  She received IV t-PA at [facility] emergency department. 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   MRA - not performed  Carotid Doppler - refer to CTA of the neck  2D Echo - pending  LDL - 57  HgbA1c pending  VTE prophylaxis - SCDs  DIET SOFT Room service appropriate?: Yes; Fluid consistency:: Thin  No antithrombotic prior to admission, now on No antithrombotic secondary to TPA.  Patient counseled to be  compliant with her antithrombotic medications  Ongoing aggressive stroke risk factor management  Therapy recommendations: Pending  Disposition:  Pending  Hypertension  Blood pressure was mildly high Permissive hypertension (OK if < 220/120) but gradually normalize in 5-7 days  Hyperlipidemia  Home meds:  Lipitor 20 mg not resumed in hospital  LDL 57, goal < 70  Continue statin at discharge  Diabetes  HgbA1c pending, goal < 7.0  Uncontrolled  Other Stroke Risk Factors  Advanced age  Cigarette smoker, quit smoking   Obesity, Body mass index is 36.89 kg/(m^2).    Other Active Problems  Mild anemia  Renal insufficiency  Repeat labs in a.m.  Hospital day # 2  Delton See [facility] Pager [phone] 08/22/2015, 7:50 AM  CRITICAL CARE NEUROLOGY ATTENDING NOTE Patient was seen and examined by me personally. I reviewed notes, independently viewed imaging studies, participated in medical decision making and plan of care. I have made additions or clarifications directly to the above note.  Documentation accurately reflects findings. The laboratory and radiographic studies were personally reviewed by me.  ROS completed by me personally and pertinent positives fully documented.  Assessment and plan completed by me personally and fully documented above.  CBC/CMP today  Left knee xray  Norco for HA  UA due to urinary frequency and urgency  Will  hold ASA for now <--given hemorrhagic transformation.  If remains stable, will initiate  Condition is improved by exam however, radiographically worsened due to hemorrhagic transformation  This patient is critically ill and at significant risk of neurological worsening, death and care requires constant monitoring of vital signs, hemodynamics,respiratory and cardiac monitoring, extensive review of multiple databases, frequent neurological assessment, discussion with family, other specialists and  medical decision making of high complexity.  This critical care time does not reflect procedure time, or teaching time or supervisory time of PA/NP/Med Resident etc. but could involve care discussion time.  I spent 45inutes of Neurocritical Care time in the care of  this patient.  SIGNED BY: Dr. Sula Soda     To contact Stroke Continuity provider, please refer to [email or web address]. After hours, contact General Neurology

## 2015-08-22 NOTE — Progress Notes (Signed)
  Echocardiogram 2D Echocardiogram has been performed.  Janalyn Harder 08/22/2015, 4:33 PM

## 2015-08-22 NOTE — Progress Notes (Signed)
Patient began having irregular heart rhythm at a rate of 110-130s patient appears to be in atrial fibrillation. Dr. Roseanne Reno notified. Order placed for PO Metoprolol 25 mg BID as directed.

## 2015-08-22 NOTE — Progress Notes (Signed)
Addendum Knee x-ray - negative.  Delton See PA-C [facility] Pager [phone] 08/22/2015, 2:05 PM

## 2015-08-22 NOTE — Progress Notes (Signed)
Utilization review completed.  

## 2015-08-23 LAB — BASIC METABOLIC PANEL
Anion gap: 12 (ref 5–15)
Anion gap: 13 (ref 5–15)
BUN: 6 mg/dL (ref 6–20)
CALCIUM: 9.6 mg/dL (ref 8.9–10.3)
CHLORIDE: 94 mmol/L — AB (ref 101–111)
CO2: 31 mmol/L (ref 22–32)
CO2: 32 mmol/L (ref 22–32)
CREATININE: 0.88 mg/dL (ref 0.44–1.00)
CREATININE: 0.85 mg/dL (ref 0.44–1.00)
Calcium: 9.5 mg/dL (ref 8.9–10.3)
Chloride: 94 mmol/L — ABNORMAL LOW (ref 101–111)
GFR calc Af Amer: >60 mL/min (ref >60)
GFR calc Af Amer: >60 mL/min (ref >60)
GFR calc non Af Amer: >60 mL/min (ref >60)
GLUCOSE: 165 mg/dL — AB (ref 65–99)
Glucose, Bld: 158 mg/dL — ABNORMAL HIGH (ref 65–99)
Potassium: 3.2 mmol/L — ABNORMAL LOW (ref 3.5–5.1)
Potassium: 3 mmol/L — ABNORMAL LOW (ref 3.5–5.1)
SODIUM: 137 mmol/L (ref 135–145)
Sodium: 139 mmol/L (ref 135–145)

## 2015-08-23 LAB — CBC
HCT: 29.7 % — ABNORMAL LOW (ref 36.0–46.0)
Hemoglobin: 10.3 g/dL — ABNORMAL LOW (ref 12.0–15.0)
MCH: 30.8 pg (ref 26.0–34.0)
MCHC: 34.7 g/dL (ref 30.0–36.0)
MCV: 88.9 fL (ref 78.0–100.0)
PLATELETS: 184 10*3/uL (ref 150–400)
RBC: 3.34 MIL/uL — ABNORMAL LOW (ref 3.87–5.11)
RDW: 13.5 % (ref 11.5–15.5)
WBC: 9.2 10*3/uL (ref 4.0–10.5)

## 2015-08-23 LAB — HEMOGLOBIN A1C
Hgb A1c MFr Bld: 6.5 % — ABNORMAL HIGH (ref 4.8–5.6)
Mean Plasma Glucose: 140 mg/dL

## 2015-08-23 MED ORDER — METOPROLOL TARTRATE 25 MG PO TABS
25.0000 mg | ORAL_TABLET | Freq: Two times a day (BID) | ORAL | Status: AC
  Administered 2015-08-23: 25 mg via ORAL
  Filled 2015-08-23: qty 1

## 2015-08-23 MED ORDER — OXYCODONE-ACETAMINOPHEN 5-325 MG PO TABS
1.0000 | ORAL_TABLET | ORAL | Status: DC | PRN
Start: 2015-08-23 — End: 2015-08-24
  Administered 2015-08-23 – 2015-08-24 (×3): 1 via ORAL
  Filled 2015-08-23 (×3): qty 1

## 2015-08-23 MED ORDER — HYDROCHLOROTHIAZIDE 25 MG PO TABS
25.0000 mg | ORAL_TABLET | Freq: Every day | ORAL | Status: DC
  Administered 2015-08-23 – 2015-08-24 (×2): 25 mg via ORAL
  Filled 2015-08-23 (×2): qty 1

## 2015-08-23 MED ORDER — PIOGLITAZONE HCL 15 MG PO TABS
15.0000 mg | ORAL_TABLET | Freq: Every day | ORAL | Status: DC
  Administered 2015-08-23 – 2015-08-24 (×2): 15 mg via ORAL
  Filled 2015-08-23 (×3): qty 1

## 2015-08-23 MED ORDER — HYDROCODONE-ACETAMINOPHEN 10-325 MG PO TABS
1.0000 | ORAL_TABLET | Freq: Four times a day (QID) | ORAL | Status: DC | PRN
  Administered 2015-08-23: 1 via ORAL
  Filled 2015-08-23: qty 1

## 2015-08-23 MED ORDER — CYCLOBENZAPRINE HCL 10 MG PO TABS: 10.0000 mg | ORAL_TABLET | Freq: Two times a day (BID) | ORAL | Status: DC | PRN

## 2015-08-23 MED ORDER — LEVOTHYROXINE SODIUM 25 MCG PO TABS
25.0000 ug | ORAL_TABLET | Freq: Every day | ORAL | Status: DC
  Administered 2015-08-24: 25 ug via ORAL
  Filled 2015-08-23: qty 1

## 2015-08-23 MED ORDER — TRAZODONE HCL 50 MG PO TABS
150.0000 mg | ORAL_TABLET | Freq: Every day | ORAL | Status: DC
  Administered 2015-08-23: 150 mg via ORAL
  Filled 2015-08-23 (×2): qty 1

## 2015-08-23 MED ORDER — METOPROLOL SUCCINATE ER 25 MG PO TB24
50.0000 mg | ORAL_TABLET | Freq: Every day | ORAL | Status: DC
  Administered 2015-08-24: 50 mg via ORAL
  Filled 2015-08-23: qty 2

## 2015-08-23 MED ORDER — GLIPIZIDE 5 MG PO TABS
10.0000 mg | ORAL_TABLET | Freq: Every day | ORAL | Status: DC
  Administered 2015-08-24: 10 mg via ORAL
  Filled 2015-08-23 (×2): qty 2

## 2015-08-23 MED ORDER — METFORMIN HCL 500 MG PO TABS
1000.0000 mg | ORAL_TABLET | Freq: Two times a day (BID) | ORAL | Status: DC
Start: 2015-08-23 — End: 2015-08-24
  Administered 2015-08-23 – 2015-08-24 (×2): 1000 mg via ORAL
  Filled 2015-08-23 (×2): qty 2

## 2015-08-23 MED ORDER — IBUPROFEN 200 MG PO TABS: 400.0000 mg | ORAL_TABLET | Freq: Four times a day (QID) | ORAL | Status: DC | PRN

## 2015-08-23 MED ORDER — NAPROXEN SODIUM 275 MG PO TABS: 550.0000 mg | ORAL_TABLET | Freq: Every evening | ORAL | Status: DC | PRN

## 2015-08-23 MED ORDER — ENALAPRIL MALEATE 20 MG PO TABS
20.0000 mg | ORAL_TABLET | Freq: Two times a day (BID) | ORAL | Status: DC
  Administered 2015-08-23 – 2015-08-24 (×3): 20 mg via ORAL
  Filled 2015-08-23 (×5): qty 1

## 2015-08-23 MED ORDER — ATORVASTATIN CALCIUM 10 MG PO TABS
20.0000 mg | ORAL_TABLET | Freq: Every day | ORAL | Status: DC
  Administered 2015-08-23: 20 mg via ORAL
  Filled 2015-08-23: qty 2

## 2015-08-23 NOTE — Clinical Documentation Improvement (Signed)
Neurology  Can the diagnosis of anemia be further specified? Please document response in next progress note NOT in BPA drop down box. Thanks!   Iron deficiency Anemia  Nutritional anemia, including the nutrition or mineral deficits  Chronic Blood Loss Anemia, including the suspected or known cause  Anemia of chronic disease, including the associated chronic disease state  Other  Clinically Undetermined  Document any associated diagnoses/conditions.  Supporting Information:  H&H appear stable at 10.3/29.7  Please exercise your independent, professional judgment when responding. A specific answer is not anticipated or expected.  Thank You,  Shellee Milo RN, BSN, CCDS Health Information Management [place] [phone]: Cell: [phone]

## 2015-08-23 NOTE — Progress Notes (Signed)
STROKE TEAM PROGRESS NOTE   HISTORY AT THE TIME OF ADMISSION Megan [name] is an 65 y.o. female with a history of diabetes mellitus, hypertension and obesity who was seen in the ED at [facility] following acute onset of left facial droop, slurred speech and left-sided weakness. She was last known well at 4 PM today. Family found her on the floor home at 5 PM. CT scan of her head showed no acute intracranial abnormality. Teleneurologist was consulted and recommended TPA which was administered. There was improvement in right of left upper extremity but no other noticeable improvements. CT angiogram showed thrombus involving right middle cerebral artery with partial occlusion. Patient was transferred to [facility] for further management, including interventional radiology procedures. Angiogram showed occlusion of a distal MCA branch which was not amenable to thrombectomy. Patient was beyond time window for use of intra-arterial TPA. She was admitted to neuro intensive care unit for further management.    Procedures   CEREBRAL ANGIOGRAM [ZOX0960 (Custom)]      Expand All Collapse All   S/P 4 vessel cerebral arteriogram  Rt CFa approach. Findings. 1.Distal RT MCA angular branch clot migration ,into M3-M4 region      LSN: 4 PM on 08/20/2015 tPA Given: Yes 08/21/2015 at 2014 mRankin:   SUBJECTIVE (INTERVAL HISTORY) Her family is not at the bedside.  Overall she feels her condition is controlled. RN reports that patient converted into atrial fibrillation overnight 08/21/15.  Patient no complaints today OBJECTIVE Temp:  [97.9 F (36.6 C)-99.2 F (37.3 C)] 97.9 F (36.6 C) (02/06 0800) Pulse Rate:  [60-138] 82 (02/06 0700) Cardiac Rhythm:  [-] Atrial fibrillation (02/05 2000) Resp:  [12-30] 20 (02/06 0700) BP: (110-174)/(55-119) 142/86 mmHg (02/06 0700) SpO2:  [91 %-100 %] 99 % (02/06 0700)  CBC:   Recent Labs Lab 08/20/15 1947 08/22/15 0941 08/23/15 0304  WBC 9.6 11.0* 9.2  NEUTROABS 7.9*   --   --   HGB 10.4* 10.3* 10.3*  HCT 31.1* 30.4* 29.7*  MCV 91.2 89.9 88.9  PLT 177 186 184    Basic Metabolic Panel:   Recent Labs Lab 08/23/15 0304 08/23/15 0917  NA 137 139  K 3.2* 3.0*  CL 94* 94*  CO2 31 32  GLUCOSE 158* 165*  BUN 6 <5*  CREATININE 0.88 0.85  CALCIUM 9.5 9.6    Lipid Panel:     Component Value Date/Time   CHOL 117 08/21/2015 0435   TRIG 85 08/21/2015 0435   HDL 43 08/21/2015 0435   CHOLHDL 2.7 08/21/2015 0435   VLDL 17 08/21/2015 0435   LDLCALC 57 08/21/2015 0435   HgbA1c: No results found for: HGBA1C Urine Drug Screen:     Component Value Date/Time   LABOPIA POSITIVE* 08/22/2015 0931   COCAINSCRNUR NONE DETECTED 08/22/2015 0931   LABBENZ POSITIVE* 08/22/2015 0931   AMPHETMU NONE DETECTED 08/22/2015 0931   THCU NONE DETECTED 08/22/2015 0931   LABBARB NONE DETECTED 08/22/2015 0931      IMAGING  Ct Angio Head Neck W/cm &/or Wo Cm 08/20/2015   1. Positive for Emergent Large Vessel Occlusion: Right MCA bifurcation see series 604, image 16. I am advised that the patient is status post IV tPA and this lesion appears partially recannalized. Still, I recommend consideration for Neurointerventional endovascular clot retrieval.   2. Little to no CT changes of right MCA territory infarct are evident. No intracranial hemorrhage or mass effect.  3. Mild for age atherosclerosis. No other hemodynamically significant stenosis in the head  or neck.  4. Pulmonary septal thickening may indicate mild or developing interstitial edema. There is pulmonary artery enlargement suggesting a degree of underlying pulmonary artery hypertension.    Ct Head Wo Contrast 08/20/2015   1. No CT evidence of acute infarction.  2. No intracranial hemorrhage.  3. Minimal white matter microvascular disease.   Mr Brain Wo Contrast 08/21/2015   Evolving moderate RIGHT MCA territory infarct ; basal ganglia hematomas, petechial hemorrhage RIGHT insula.  Local mass effect without  midline shift.  Additional foci of acute ischemia LEFT cerebellum, RIGHT parietal lobe (watershed).  Moderate chronic small vessel ischemic disease.   PHYSICAL EXAM HEENT- Normocephalic, no lesions, without obvious abnormality. Normal external eye and conjunctiva. Neck supple with no masses, nodes, nodules or enlargement. Cardiovascular - irregular rate and rhythm  Lungs - chest clear, no wheezing, rales, normal symmetric air entry Abdomen - soft, non-tender; bowel sounds normal; no masses, no organomegaly Extremities - no joint deformities, effusion, or inflammation and no edema  Neurologic Examination: Patient is awake laert able to follow commands and oriented x3.  She is in no acute distress. Dysarthria also improved  Pupils were equal and reacted normally to light. Extraocular movements were full. Patient had a gaze preference to the right side, however can cross midline. Left partial homonymous hemianopsia Left lower facial weakness  Motor exam showed minimal left sided drift with moderately severe weakness of left upper extremity proximally and distally, as well as moderate weakness proximally of left lower extremity. Strength of right extremities was normal.  Sensory Exam:  Patient reports decrease sensation to both lower extremities (left greater than right)  Coordination/Gait: deferred   ASSESSMENT/PLAN [name] is a 65 y.o. female with history of hypertension, diabetes mellitus, anxiety, depression, obesity, agorophobia with panic disorder, and chronic pain presenting with left facial droop, slurred speech, and left hemiparesis.  She received IV t-PA at [facility] emergency department. 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   MRA - not  performed  Carotid Doppler - refer to CTA of the neck  2D Echo - EF 55-60% AFIB noted  LDL - 57  HgbA1c pending  VTE prophylaxis - SCDs DIET SOFT Room service appropriate?: Yes; Fluid consistency:: Thin Diet NPO time specified  No antithrombotic prior to admission, now on No antithrombotic secondary to TPA.  Patient counseled to be compliant with her antithrombotic medications  Ongoing aggressive stroke risk factor management  Therapy recommendations: home Disposition: home Hypertension  Blood pressure was mildly high Permissive hypertension (OK if < 220/120) but gradually normalize in 5-7 days  Hyperlipidemia  Home meds:  Lipitor 20 mg not resumed in hospital  LDL 57, goal < 70  Continue statin at discharge  Diabetes  HgbA1c pending, goal < 7.0  Uncontrolled  Other Stroke Risk Factors  Advanced age  Cigarette smoker, quit smoking   Obesity, Body mass index is 36.89 kg/(m^2).    Other Active Problems  Mild anemia  Renal insufficiency  Repeat labs in a.m.  Hospital day # 3      Patient was seen and examined by me personally. I reviewed notes, independently viewed imaging studies, participated in medical decision making and plan of care. I have made additions or clarifications directly to the above note.  Documentation accurately reflects findings. The  laboratory and radiographic studies were personally reviewed by me.      Will hold ASA for now <--given hemorrhagic transformation.  If remains stable, will initiate  Condition is improved by exam . Has PAF likely cause of stroke but due to hemorrhagic transformation will not start anticoagulation for atleast 1-2 weeks  Transfer to floor bed today and mobilize out of bed     SIGNED BY: Dr. Delia Heady, MD   To contact Stroke Continuity provider, please refer to [email or web address]. After hours, contact General Neurology

## 2015-08-23 NOTE — Evaluation (Signed)
Speech Language Pathology Evaluation Patient Details Name: Megan [name] MRN: [id number] DOB: June 19, 1951 Today's Date: 08/23/2015 Time: 1530-1550 SLP Time Calculation (min) (ACUTE ONLY): 20 min  Problem List:  Patient Active Problem List   Diagnosis Date Noted  . Cerebral infarction due to unspecified mechanism   . Knee pain, acute   . Cephalalgia   . GERD (gastroesophageal reflux disease) 06/14/2011  . FH: colon cancer 04/05/2011  . History of colon polyps 04/05/2011  . Left sided abdominal pain 04/05/2011  . N&V (nausea and vomiting) 04/05/2011   Past Medical History:  Past Medical History  Diagnosis Date  . HTN (hypertension)   . DM (diabetes mellitus) (HCC)   . Anxiety   . Depression   . Obesity   . Colon polyps   . Complication of anesthesia   . PONV (postoperative nausea and vomiting)   . Agoraphobia with panic disorder   . Chronic pain 03/08/15    Hydrocodone-APAP 10/325 mg, # 120 q month   Past Surgical History:  Past Surgical History  Procedure Laterality Date  . Cardiac catheterization  2004    normal  . S/p hysterectomy      complete  . Abdominal hysterectomy  15 yrs ago    with bso at aph  . Cholecystectomy  25 yrs ago    [facility]  . Colonoscopy  05/04/11    left-sided colonic diverticulosis  . Esophagogastroduodenoscopy  05/04/2011    Abnormal distal esophagus, biopsies consistent with inflammation due to acid reflux. No evidence of Barrett's. No H. pylori   HPI:  Pt admitted to [facility] following acute onset Lt facial droop, lt sided weakness, and slurred speech. TPA was administered with improvement in Lt UE, but no other noticeable improvements. CT angiogram showed thrombus MCA with partial occlusion; MRI showed RIGHT MCA territory infarct ; basal ganglia   Assessment / Plan / Recommendation Clinical Impression  Cognitive-linguistic evaluation complete. Patient presents with mild dysarthria due to left sided facial weakness and mild-moderate  cognitive impairments in the areas of sustained attention, awareness, reasoning, and problem solving. Will benefit from SLP f/u to address above areas.     SLP Assessment  Patient needs continued Speech Lanaguage Pathology Services    Follow Up Recommendations  Inpatient Rehab    Frequency and Duration min 2x/week  2 weeks      SLP Evaluation Prior Functioning  Cognitive/Linguistic Baseline: Within functional limits Type of Home: Mobile home  Lives With: Spouse Available Help at Discharge: Family;Available 24 hours/day   Cognition  Overall Cognitive Status: Impaired/Different from baseline Arousal/Alertness: Awake/alert Orientation Level: Oriented X4 Attention: Sustained Sustained Attention: Impaired Sustained Attention Impairment: Verbal complex;Functional complex;Functional basic Memory: Impaired Memory Impairment: Storage deficit;Retrieval deficit;Decreased recall of new information Awareness: Impaired Awareness Impairment: Intellectual impairment Problem Solving: Impaired Problem Solving Impairment: Verbal complex Safety/Judgment: Appears intact    Comprehension  Auditory Comprehension Overall Auditory Comprehension: Appears within functional limits for tasks assessed Visual Recognition/Discrimination Discrimination: Within Function Limits Reading Comprehension Reading Status: Not tested    Expression Expression Primary Mode of Expression: Verbal Verbal Expression Overall Verbal Expression: Appears within functional limits for tasks assessed Written Expression Dominant Hand: Right   Oral / Motor  Oral Motor/Sensory Function Overall Oral Motor/Sensory Function: Mild impairment Facial ROM: Reduced left;Suspected CN VII (facial) dysfunction Facial Symmetry: Abnormal symmetry left;Suspected CN VII (facial) dysfunction Facial Strength: Reduced left;Suspected CN VII (facial) dysfunction Facial Sensation: Within Functional Limits Lingual ROM: Within Functional  Limits Lingual Symmetry: Within Functional Limits Lingual Strength:  Within Functional Limits Lingual Sensation: Within Functional Limits Velum: Within Functional Limits Mandible: Within Functional Limits Motor Speech Overall Motor Speech: Impaired Respiration: Within functional limits Phonation: Normal Resonance: Within functional limits Articulation: Impaired Level of Impairment: Word Intelligibility: Intelligibility reduced Word: 75-100% accurate Phrase: 75-100% accurate Sentence: 75-100% accurate Conversation: 75-100% accurate Motor Planning: Witnin functional limits   GO            [employer] MA, CCC-SLP [phone]         Megan [name] Meryl 08/23/2015, 4:17 PM

## 2015-08-23 NOTE — Progress Notes (Signed)
Physical Therapy Treatment Patient Details Name: Megan [name] MRN: [id number] DOB: [birth year] Today's Date: 08/23/2015    History of Present Illness Megan [name] is an 65 y.o. female with a history of diabetes mellitus, hypertension and obesity who was seen in the ED at [facility] following acute onset of left facial droop, slurred speech and left-sided weakness. Of note, she fell on her L knee and is in a considerable amount of pain; X-ray negative for fx    PT Comments    Progressing with mobility, but still limited due to knee pain.  Noted laxity with testing of LCL L knee and may have ligamentous injury due to fall.  Feel knee brace may be helpful as well as orthopedic consult.  Will continued skilled PT in the acute setting and expect progress to go home with assist as noted below.  Follow Up Recommendations  Home health PT;Supervision/Assistance - 24 hour     Equipment Recommendations  Rolling walker with 5" wheels;3in1 (PT)    Recommendations for Other Services       Precautions / Restrictions Precautions Precautions: Fall    Mobility  Bed Mobility Overal bed mobility: Needs Assistance Bed Mobility: Supine to Sit;Rolling Rolling: Min assist   Supine to sit: Mod assist     General bed mobility comments: lots of rolling and assist with hygiene due to pt soiled with urine in bed; assist to bring legs off bed and lift trunk with rail from sidelying; pt c/o L knee pain when assisting legs off bed  Transfers Overall transfer level: Needs assistance Equipment used: Rolling walker (2 wheeled) Transfers: Sit to/from Stand Sit to Stand: Min assist;Min guard         General transfer comment: increased time, assist for safety, lines, etc  Ambulation/Gait Ambulation/Gait assistance: Min guard;+2 safety/equipment Ambulation Distance (Feet): 10 Feet Assistive device: Rolling walker (2 wheeled) Gait Pattern/deviations: Step-through pattern;Decreased stride  length;Shuffle     General Gait Details: cues for keeping R UE on walker, cues for safety and assist with IV, telemetry leads; +2 for chair and assist with lines, pt able to ambulate with RW to door of bathroom, turned around and walked to look out window, then reported legs giving out (esp L)   Stairs            Wheelchair Mobility    Modified Rankin (Stroke Patients Only)       Balance Overall balance assessment: Needs assistance         Standing balance support: Bilateral upper extremity supported Standing balance-Leahy Scale: Poor Standing balance comment: UE support needed for safety and due to LE pain                    Cognition Arousal/Alertness: Awake/alert Behavior During Therapy: WFL for tasks assessed/performed Overall Cognitive Status: Within Functional Limits for tasks assessed                      Exercises      General Comments        Pertinent Vitals/Pain Pain Score: 8  Pain Location: generalized, esp L knee Pain Descriptors / Indicators: Aching;Discomfort;Grimacing Pain Intervention(s): Repositioned;Monitored during session    Home Living                      Prior Function            PT Goals (current goals can now be found in the care plan section)  Progress towards PT goals: Progressing toward goals    Frequency  Min 4X/week    PT Plan Current plan remains appropriate    Co-evaluation             End of Session Equipment Utilized During Treatment: Gait belt Activity Tolerance: Patient limited by fatigue;Patient limited by pain Patient left: in chair;with call bell/phone within reach;with chair alarm set     Time: 1610-9604 PT Time Calculation (min) (ACUTE ONLY): 27 min  Charges:  $Gait Training: 8-22 mins $Therapeutic Activity: 8-22 mins                    G Codes:      Elray Mcgregor 06-Sep-2015, 2:00 PM [place], [place] [phone] 09/06/15

## 2015-08-23 NOTE — Evaluation (Signed)
Occupational Therapy Evaluation Patient Details Name: Megan [name] MRN: [id number] DOB: 22-Apr-1951 Today's Date: 08/23/2015    History of Present Illness Pt admitted to [facility] following acute onset Lt facial droop, lt sided weakness, and slurred speech.   TPA was administered with improvement in Lt UE, but no other noticeable improvements.  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  She was transferred to [facility].   PMH includes DM, HTN, obesity    Clinical Impression   Pt admitted with above. She demonstrates the below listed deficits and will benefit from continued OT to maximize safety and independence with BADLs.  Pt presents to OT with impaired balance, Lt knee pain, questionable mild Lt inattention, decreased safety and awareness (may be baseline), impaired memory, and Lt UE weakness.   She requires minA for ADLs, and is very eager to discharge home.  Recommend HHOT.  Will follow.       Follow Up Recommendations  Home health OT;Supervision/Assistance - 24 hour    Equipment Recommendations  3 in 1 bedside comode;Tub/shower bench    Recommendations for Other Services       Precautions / Restrictions Precautions Precautions: Fall      Mobility Bed Mobility Overal bed mobility: Needs Assistance Bed Mobility: Supine to Sit;Sit to Supine Rolling: Min assist   Supine to sit: Min assist Sit to supine: Min assist   General bed mobility comments: Pt requires assist to lift and lower trunk from/to bed   Transfers Overall transfer level: Needs assistance Equipment used: Rolling walker (2 wheeled) Transfers: Sit to/from [employer] Sit to Stand: Min assist;Min guard Stand pivot transfers: Min assist;Min guard       General transfer comment: variable performance due to Lt knee pain     Balance Overall balance assessment: Needs assistance Sitting-balance support: Feet supported Sitting balance-Leahy Scale: Good     Standing  balance support: During functional activity Standing balance-Leahy Scale: Poor Standing balance comment: UE support needed for safety and due to LE pain                            ADL Overall ADL's : Needs assistance/impaired Eating/Feeding: Bed level;Modified independent   Grooming: Wash/dry hands;Wash/dry face;Oral care;Supervision/safety;Sitting   Upper Body Bathing: Supervision/ safety;Sitting   Lower Body Bathing: Minimal assistance;Sit to/from stand   Upper Body Dressing : Minimal assistance;Sitting   Lower Body Dressing: Minimal assistance;Sit to/from stand Lower Body Dressing Details (indicate cue type and reason): requires assist for Lt LE due to weakness  Toilet Transfer: Min Barrister's clerk Details (indicate cue type and reason): Pt limited by knee pain  Toileting- Clothing Manipulation and Hygiene: Minimal assistance;Sit to/from stand       Functional mobility during ADLs: Min guard;Minimal assistance;Rolling walker General ADL Comments: Pt requires cues for safety awareness      Vision Vision Assessment?: Yes Eye Alignment: Within Functional Limits Ocular Range of Motion: Within Functional Limits Tracking/Visual Pursuits: Able to track stimulus in all quads without difficulty Visual Fields: No apparent deficits Additional Comments: Pt able to read one paragraph of information    Perception Perception Perception Tested?: Yes Perception Deficits: Inattention/neglect Inattention/Neglect: Does not attend to left side of body Spatial deficits: questionable Lt inattention.  While sitting EOB, pt with Rt gaze preference.  She will turn to locate items and therapist on her Lt, but keeps head turned to Rt    Praxis Praxis Praxis tested?: Within  functional limits    Pertinent Vitals/Pain Pain Assessment: Faces Pain Score: 8  Faces Pain Scale: Hurts whole lot Pain Location: Lt knee and generalized  Pain Descriptors / Indicators:  Aching;Grimacing;Guarding Pain Intervention(s): Monitored during session;Repositioned     Hand Dominance Right   Extremity/Trunk Assessment Upper Extremity Assessment Upper Extremity Assessment: LUE deficits/detail LUE Deficits / Details: grossly 4-/5   Lower Extremity Assessment Lower Extremity Assessment: Defer to PT evaluation   Cervical / Trunk Assessment Cervical / Trunk Assessment: Normal   Communication Communication Communication: No difficulties   Cognition Arousal/Alertness: Awake/alert Behavior During Therapy: WFL for tasks assessed/performed Overall Cognitive Status: Impaired/Different from baseline Area of Impairment: Memory;Safety/judgement;Problem solving     Memory: Decreased short-term memory   Safety/Judgement: Decreased awareness of safety   Problem Solving: Requires verbal cues General Comments: unsure of pt's baseline.  Spouse does not provide info.  Pt made several comments though to which he responded with a quizzical look as if her responses may not be normal for her   General Comments       Exercises       Shoulder Instructions      Home Living Family/patient expects to be discharged to:: Private residence Living Arrangements: Spouse/significant other Available Help at Discharge: Family;Available 24 hours/day Type of Home: House Home Access: Stairs to enter [employer] of Steps: 6 Entrance Stairs-Rails: Right;Left Home Layout: One level     Bathroom Shower/Tub: Chief Strategy Officer: Standard     Home Equipment: None          Prior Functioning/Environment Level of Independence: Needs assistance  Gait / Transfers Assistance Needed: independent.  H/o Vertigo  ADL's / Homemaking Assistance Needed: Pt reports she no longer grocery shops because she is unable to recall what she needs when she gets to the grocery store.  Spouse cooks.  Pt manages finances, drives, and manages own medications independently          OT Diagnosis: Generalized weakness;Cognitive deficits;Disturbance of vision   OT Problem List: Decreased strength;Decreased activity tolerance;Impaired balance (sitting and/or standing);Impaired vision/perception;Decreased cognition;Decreased safety awareness;Obesity   OT Treatment/Interventions: Self-care/ADL training;Therapeutic exercise;DME and/or AE instruction;Therapeutic activities;Cognitive remediation/compensation;Visual/perceptual remediation/compensation;Patient/family education;Balance training    OT Goals(Current goals can be found in the care plan section) Acute Rehab OT Goals Patient Stated Goal: to go home ASAP OT Goal Formulation: With patient Time For Goal Achievement: 09/06/15 Potential to Achieve Goals: Good ADL Goals Pt Will Perform Grooming: with supervision;standing Pt Will Perform Upper Body Bathing: with set-up;sitting Pt Will Perform Lower Body Bathing: with supervision;sit to/from stand Pt Will Perform Upper Body Dressing: with supervision;sitting Pt Will Perform Lower Body Dressing: with supervision;sit to/from stand Pt Will Transfer to Toilet: with supervision;ambulating;regular height toilet;bedside commode;grab bars Pt Will Perform Toileting - Clothing Manipulation and hygiene: with supervision;sit to/from stand Pt/caregiver will Perform Home Exercise Program: Increased strength;Left upper extremity;With theraband;With Supervision;With written HEP provided  OT Frequency: Min 2X/week   Barriers to D/C:            Co-evaluation              End of Session Equipment Utilized During Treatment: Rolling walker Nurse Communication: Mobility status  Activity Tolerance: Patient tolerated treatment well Patient left: in bed;with call bell/phone within reach;with family/visitor present   Time: 1311-1346 OT Time Calculation (min): 35 min Charges:  OT General Charges $OT Visit: 1 Procedure OT Evaluation $OT Eval Moderate Complexity: 1  Procedure OT Treatments $Self Care/Home Management : 8-22 mins  G-Codes:    Jeani Hawking M 08/23/2015, 2:16 PM

## 2015-08-23 NOTE — Care Management Note (Signed)
Case Management Note  Patient Details  Name: Megan [name] MRN: [id number] Date of Birth: 1951-05-23  Subjective/Objective:    Pt admitted on 08/20/15 s/p stroke with TPA given.  PTA, pt independent, lives with spouse.                  Action/Plan: Will follow for discharge planning as pt progresses.    Expected Discharge Date:                  Expected Discharge Plan:  Home w Home Health Services  In-House Referral:     Discharge planning Services  CM Consult  Post Acute Care Choice:    Choice offered to:     DME Arranged:    DME Agency:     HH Arranged:    HH Agency:     Status of Service:  In process, will continue to follow  Medicare Important Message Given:    Date Medicare IM Given:    Medicare IM give by:    Date Additional Medicare IM Given:    Additional Medicare Important Message give by:     If discussed at Long Length of Stay Meetings, dates discussed:    Additional Comments:  Quintella Baton, RN, BSN  Trauma/Neuro ICU Case Manager [phone]

## 2015-08-24 ENCOUNTER — Encounter (HOSPITAL_COMMUNITY): Payer: Self-pay | Admitting: Interventional Radiology

## 2015-08-24 MED ORDER — ASPIRIN 81 MG PO TBEC: 81.0000 mg | DELAYED_RELEASE_TABLET | Freq: Every day | ORAL | Status: AC

## 2015-08-24 MED ORDER — ASPIRIN EC 81 MG PO TBEC: 81.0000 mg | DELAYED_RELEASE_TABLET | Freq: Every day | ORAL | Status: DC

## 2015-08-24 MED ORDER — APIXABAN 5 MG PO TABS: 5.0000 mg | ORAL_TABLET | Freq: Two times a day (BID) | ORAL | Status: DC

## 2015-08-24 NOTE — Progress Notes (Signed)
Occupational Therapy Treatment Patient Details Name: Megan [name] MRN: [id number] DOB: 1951/01/14 Today's Date: 08/24/2015    History of present illness Pt admitted to [facility] following acute onset Lt facial droop, lt sided weakness, and slurred speech.   TPA was administered with improvement in Lt UE, but no other noticeable improvements.  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  She was transferred to [facility].   PMH includes DM, HTN, obesity    OT comments  This 65 yo making progress since eval with basic ADLs, she will continue to benefit from acute OT with follow up HHOT to progress to a Mod I level.  Follow Up Recommendations  Home health OT;Supervision/Assistance - 24 hour    Equipment Recommendations  3 in 1 bedside comode       Precautions / Restrictions Precautions Precautions: Fall Restrictions Weight Bearing Restrictions: No       Mobility Bed Mobility Overal bed mobility: Needs Assistance Bed Mobility: Sit to Supine Rolling: Min assist Sidelying to sit: Min assist   Sit to supine: Mod assist (for Bil LEs)    Transfers Overall transfer level: Needs assistance Equipment used: Rolling walker (2 wheeled) Transfers: Sit to/from Stand Sit to Stand: Min assist            Balance Overall balance assessment: Needs assistance Sitting-balance support: Feet supported;No upper extremity supported Sitting balance-Leahy Scale: Good     Standing balance support: Single extremity supported;During functional activity Standing balance-Leahy Scale: Poor Standing balance comment: reliant on at least 1 UE for support when up on her feet                   ADL Overall ADL's : Needs assistance/impaired                         Toilet Transfer: Minimal assistance;Ambulation;RW;Comfort height toilet;Grab bars   Toileting- Clothing Manipulation and Hygiene: Minimal assistance;Sit to/from stand   Tub/ Shower Transfer: Tub  transfer;Minimal assistance;Ambulation;Rolling walker;Tub bench     General ADL Comments: I discussed with pt and husband that when pt gets dressed to up LLE in underwear/pants first and put LUE in bra/shirt first. Family reports that they have access to a tub bench                Cognition   Behavior During Therapy: [facility] for tasks assessed/performed Overall Cognitive Status: History of cognitive impairments - at baseline (per husband) Area of Impairment: Safety/judgement;Problem solving     Memory: Decreased short-term memory    Safety/Judgement: Decreased awareness of safety;Decreased awareness of deficits (with use of RW and safe hand placement with transfers with RW)   Problem Solving: Requires verbal cues General Comments: unsure of pt's baseline.  Spouse does not provide info.  Pt made several comments though to which he responded with a quizzical look as if her responses may not be normal for her      Exercises Other Exercises Other Exercises: educated pt to use, use, use her LUE as much as possible and educated her husband to cue her to use it when he notices she is mainly using her RUE for a two handed task.           Pertinent Vitals/ Pain       Pain Assessment: Faces Faces Pain Scale: Hurts even more Pain Location: left knee Pain Descriptors / Indicators: Aching;Sore Pain Intervention(s): Monitored during session;Repositioned  Frequency Min 2X/week     Progress Toward Goals  OT Goals(current goals can now be found in the care plan section)  Progress towards OT goals: Progressing toward goals  Acute Rehab OT Goals Patient Stated Goal: to go home ASAP ADL Goals Pt Will Perform Tub/Shower Transfer: Tub transfer;with min guard assist;ambulating;rolling walker;tub bench  Plan Discharge plan remains appropriate       End of Session Equipment Utilized During Treatment: Rolling walker;Gait belt   Activity Tolerance Patient tolerated treatment well    Patient Left in bed;with call bell/phone within reach;with bed alarm set;with family/visitor present   Nurse Communication          Time: 1340-1426 OT Time Calculation (min): 46 min  Charges: OT General Charges $OT Visit: 1 Procedure OT Treatments $Self Care/Home Management : 38-52 mins  Evette Georges [phone] 08/24/2015, 2:38 PM

## 2015-08-24 NOTE — Care Management Note (Signed)
Case Management Note  Patient Details  Name: Megan [name] MRN: [id number] Date of Birth: 1951/04/28  Subjective/Objective:                    Action/Plan: CM met with patient and family to discuss discharge planning. Patient has chosen [facility] to provided [facility] services.  CM spoke with [place] with [facility], who has accepted the referral for discharge home today.  Orders received for a rolling walker and 3N1. Patient is declining the rolling walker, but would like the 3N1. Jermaine with [facility] was notified of the need for the 3N1 prior to discharge home today.  Bedside RN updated.  Expected Discharge Date:                  Expected Discharge Plan:  [place]  In-House Referral:     Discharge planning Services  CM Consult  Post Acute Care Choice:  Durable Medical Equipment, Home Health Choice offered to:  Patient  DME Arranged:  3-N-1 DME Agency:  [place] Arranged:  RN, PT, OT, Nurse's Aide, Speech Therapy HH Agency:  [place]  Status of Service:  Completed, signed off  Medicare Important Message Given:    Date Medicare IM Given:    Medicare IM give by:    Date Additional Medicare IM Given:    Additional Medicare Important Message give by:     If discussed at [place] of Stay Meetings, dates discussed:    Additional Comments:  Rolm Baptise, RN 08/24/2015, 3:07 PM

## 2015-08-24 NOTE — Progress Notes (Signed)
Physical Therapy Treatment Patient Details Name: Megan [name] MRN: [id number] DOB: March 19, 1951 Today's Date: 08/24/2015    History of Present Illness Pt admitted to [facility] following acute onset Lt facial droop, lt sided weakness, and slurred speech.   TPA was administered with improvement in Lt UE, but no other noticeable improvements.  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  She was transferred to [facility].   PMH includes DM, HTN, obesity     PT Comments    Husband present for all education and asking appropriate questions. Patient eager to go home. Husband and female caregiver unsure they can provide 24 hr supervision/assist, however there are numerous family members and friends they plan to speak to. By end of session, family felt they could manage at home.    Follow Up Recommendations  Home health PT;Supervision/Assistance - 24 hour     Equipment Recommendations  Rolling walker with 5" wheels;3in1 (PT)    Recommendations for Other Services       Precautions / Restrictions Precautions Precautions: Fall    Mobility  Bed Mobility Overal bed mobility: Needs Assistance Bed Mobility: Rolling;Sidelying to Sit Rolling: Min assist Sidelying to sit: Min assist       General bed mobility comments: Pt initially wanting someone to "pull her up" from supine. Educated pt and spouse that this puts him at risk for back injury and educated on side to sit.  Transfers Overall transfer level: Needs assistance Equipment used: Rolling walker (2 wheeled) Transfers: Sit to/from Stand Sit to Stand: Min assist         General transfer comment: required cues each time (x3) for safe use of RW; unsteady  Ambulation/Gait Ambulation/Gait assistance: Min assist Ambulation Distance (Feet): 25 Feet (15) Assistive device: Rolling walker (2 wheeled) Gait Pattern/deviations: Step-to pattern;Step-through pattern;Decreased step length - left;Decreased weight shift to  left Gait velocity: dcer Gait velocity interpretation: Below normal speed for age/gender General Gait Details: cues for sequencing, walker placement, and step length to minimize Lt knee pain and/or buckling (pt had no buckling during session). Husband preent   Stairs Stairs: Yes Stairs assistance: Min assist Stair Management: One rail Right;Step to pattern;Forwards Number of Stairs: 2 General stair comments: educated on which rail to use based on LLE pain/weakness; required constant cues for sequencing to minimize risk of Lt knee buckling. Husband present  Wheelchair Mobility    Modified Rankin (Stroke Patients Only) Modified Rankin (Stroke Patients Only) Pre-Morbid Rankin Score: No significant disability Modified Rankin: Moderately severe disability     Balance     Sitting balance-Leahy Scale: Good       Standing balance-Leahy Scale: Poor                      Cognition Arousal/Alertness: Awake/alert Behavior During Therapy: WFL for tasks assessed/performed Overall Cognitive Status: History of cognitive impairments - at baseline Area of Impairment: Memory;Safety/judgement     Memory: Decreased short-term memory   Safety/Judgement: Decreased awareness of safety;Decreased awareness of deficits   Problem Solving: Requires verbal cues General Comments: unsure of pt's baseline.  Spouse does not provide info.  Pt made several comments though to which he responded with a quizzical look as if her responses may not be normal for her    Exercises      General Comments General comments (skin integrity, edema, etc.): Patient eager to go home. Educated spouse and female family member (unidentified) to incr safety on d/c home.  Pertinent Vitals/Pain Pain Assessment: Faces Faces Pain Scale: Hurts even more Pain Location: Lt knee Pain Descriptors / Indicators: Grimacing Pain Intervention(s): Limited activity within patient's tolerance;Monitored during  session;Premedicated before session;Repositioned    Home Living                      Prior Function            PT Goals (current goals can now be found in the care plan section) Acute Rehab PT Goals Patient Stated Goal: to go home ASAP Time For Goal Achievement: 09/05/15 Progress towards PT goals: Progressing toward goals    Frequency  Min 4X/week    PT Plan Current plan remains appropriate    Co-evaluation             End of Session Equipment Utilized During Treatment: Gait belt (educated ?gait belt for sale in gift shop; could use belt) Activity Tolerance: Patient tolerated treatment well Patient left: in chair;with call bell/phone within reach;with chair alarm set;with family/visitor present     Time: 7829-5621 PT Time Calculation (min) (ACUTE ONLY): 42 min  Charges:  $Gait Training: 23-37 mins $Therapeutic Activity: 8-22 mins                    G Codes:      Rip Hawes 09-19-2015, 12:44 PM Pager [phone]

## 2015-08-24 NOTE — Discharge Summary (Addendum)
Physician Discharge Summary  Patient ID: Megan [name] MRN: [id number] DOB/AGE: 07/24/1950 65 y.o.  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 female with a history of diabetes mellitus, hypertension and obesity who was seen in the ED at [facility] following acute onset of left facial droop, slurred speech and left-sided weakness. She was last known well at 4 PM today. Family found her on the floor home at 5 PM. CT scan of her head showed no acute intracranial abnormality. Teleneurologist was consulted and recommended TPA which was administered. There was improvement in right of left upper extremity but no other noticeable improvements. CT angiogram showed thrombus involving right middle cerebral artery with partial occlusion. Patient was transferred to [facility] for further management, including interventional radiology procedures. Angiogram showed occlusion of a distal MCA branch which was not amenable to thrombectomy. Patient was beyond time window for use of intra-arterial TPA. She was admitted to neuro intensive care unit for further management. Patient was kept in intensive care unit where blood pressure was tightly controlled. Patient did well and showed gradual improvement. MRI scan of the brain A showed moderate right MCA infarct with some basal ganglia hematoma and petechial hemorrhage in the right insula. Aspirin was held because of this an anticoagulation was not started. There are tiny areas of acute ischemia in the left cerebellum and right parietal lobe as well. There are  moderate chronic changes of small vessel disease. Patient was found to have new onset atrial fibrillation on telemetry monitoring and this was transient. Carotid Doppler was not performed at the patient previously had CT angiogram of the neck and brain. Transthoracic echo which showed EF of 55-60% in atrial fibrillation was noted he did LDL cholesterol was 57 mg percent. Hemoglobin A1c was 6.5%. Patient showed her clinical improvement but had partial left homonymous hemianopsia and mild left hemiparesis with mild hand weakness and moderate leg weakness. She was seen by physical occupational speech therapy and gradually improved and at the time of discharge was felt to be stable enough to be discharged home with home physical and occupational therapy which was arranged by case manager. Patient was started on aspirin 81 mg on the day of discharge with instructions to take it for one week and then change to eliquis 5 mg twice daily and stop aspirin. The patient and husband voiced understanding of the plan. She was advised to follow-up with her primary physician Dr. Sudie Bailey and with Dr. Pearlean Brownie in his stroke clinic  Consults: Dr Corliss Skains interventional neuroradiology  Significant Diagnostic Studies:  Cerebral catheter angiogram 08/20/15 : .Distal RT MCA angular branch clot migration ,into M3-M4 region Ct Angio Head Neck W/cm &/or Wo Cm 08/20/2015  1. Positive for Emergent Large Vessel Occlusion: Right MCA bifurcation see series 604, image 16. I am advised that the patient is status post IV tPA and this lesion appears partially recannalized. Still, I recommend consideration for Neurointerventional endovascular clot retrieval.  2. Little to no CT changes of right MCA territory infarct are evident. No intracranial hemorrhage or mass effect.  3. Mild for age atherosclerosis. No other hemodynamically significant stenosis in the head or  neck.  4. Pulmonary septal thickening may indicate mild or developing  interstitial edema. There is pulmonary artery enlargement suggesting a degree of underlying pulmonary artery hypertension.    Ct Head Wo Contrast 08/20/2015  1. No CT evidence of acute infarction.  2. No intracranial hemorrhage.  3. Minimal white matter microvascular disease.   Mr Brain Wo Contrast 08/21/2015  Evolving moderate RIGHT MCA territory infarct ; basal ganglia hematomas, petechial hemorrhage RIGHT insula.  Local mass effect without midline shift.  Additional foci of acute ischemia LEFT cerebellum, RIGHT parietal lobe (watershed).  Moderate chronic small vessel ischemic disease Discharge Exam: Blood pressure 108/89, pulse 87, temperature 97.6 F (36.4 C), temperature source Oral, resp. rate 20, height  (1.575 m), weight 201 lb 11.5 oz (91.5 kg), SpO2 94 %. PHYSICAL EXAM HEENT- Normocephalic, no lesions, without obvious abnormality. Normal external eye and conjunctiva. Neck supple with no masses, nodes, nodules or enlargement. Cardiovascular - irregular rate and rhythm  Lungs - chest clear, no wheezing, rales, normal symmetric air entry Abdomen - soft, non-tender; bowel sounds normal; no masses, no organomegaly Extremities - no joint deformities, effusion, or inflammation and no edema  Neurologic Examination: Patient is awake laert able to follow commands and oriented x3. She is in no acute distress. Dysarthria also improved  Pupils were equal and reacted normally to light. Extraocular movements were full. Patient had a gaze preference to the right side, however can cross midline. Left partial homonymous hemianopsia Left lower facial weakness  Motor exam showed minimal left sided drift with moderately severe weakness of left upper extremity proximally and distally, as well as moderate weakness proximally of left lower extremity. Strength of right extremities was normal.  Sensory Exam: Patient reports decrease sensation to both lower extremities (left greater  than right)  Coordination/Gait: deferred  Disposition: 01-Home or Self Care  Discharge Instructions    Ambulatory referral to Neurology    Complete by:  As directed   Please schedule post stroke follow up in 2 months.     Discharge instructions    Complete by:  As directed   Home health PT, OT , ST, RN and NA ordered to followup     Driving Restrictions    Complete by:  As directed   We recommend no driving            Medication List    STOP taking these medications        ALEVE 220 MG tablet  Generic drug:  naproxen sodium     ibuprofen 200 MG tablet  Commonly known as:  ADVIL,MOTRIN      TAKE these medications        atorvastatin 20 MG tablet  Commonly known as:  LIPITOR  Take 20 mg by mouth at bedtime.     enalapril 20 MG tablet  Commonly known as:  VASOTEC  Take 20 mg by mouth 2 (two) times daily.     glipiZIDE 10 MG tablet  Commonly known as:  GLUCOTROL  Take 10 mg by mouth every morning. In the AM     hydrochlorothiazide 25 MG tablet  Commonly known as:  HYDRODIURIL  Take 25 mg by mouth daily.     HYDROcodone-acetaminophen 10-325 MG tablet  Commonly known as:  NORCO  Take 1 tablet by mouth every 6 (six) hours as needed for moderate pain.     levothyroxine 25 MCG tablet  Commonly known as:  SYNTHROID, LEVOTHROID  Take 25 mcg by mouth daily.  metFORMIN 500 MG tablet  Commonly known as:  GLUCOPHAGE  Take 1,000 mg by mouth 2 (two) times daily with a meal.     metoprolol succinate 50 MG 24 hr tablet  Commonly known as:  TOPROL-XL  Take 50 mg by mouth daily with lunch.     pioglitazone 15 MG tablet  Commonly known as:  ACTOS  Take 15 mg by mouth daily.     traZODone 150 MG tablet  Commonly known as:  DESYREL  Take 150 mg by mouth at bedtime.     VITAMIN D-3 PO  Take 1 tablet by mouth daily. Aspirin 81 mg daily x 1 week and stop and then start Eliquis 5 mg twice daily            Follow-up Information    Follow up with SETHI,PRAMOD,  MD In 2 months.   Specialties:  Neurology, Radiology   Why:  Stroke Clinic, Office will call you with appointment date & time   Contact information:   [address] [place] [address] [phone]       Follow up with Milana Obey, MD On 09/07/2015.   Specialty:  Family Medicine   Why:  for hospital follow up 09/07/15 at 2:10 pm with Dr. [name] scheduled by  Olevia Bowens information:   [address] [place] [address] [phone]       Follow up with [facility].   Why:  Office will call to make appointments   Contact information:   [phone]     Time for discharge summary preparation : 40 minutes Signed: SETHI,PRAMOD 08/24/2015, 3:41 PM

## 2015-08-24 NOTE — Progress Notes (Addendum)
STROKE TEAM PROGRESS NOTE   HISTORY AT THE TIME OF ADMISSION Megan [name] is an 65 y.o. female with a history of diabetes mellitus, hypertension and obesity who was seen in the ED at [facility] following acute onset of left facial droop, slurred speech and left-sided weakness. She was last known well at 4 PM today. Family found her on the floor home at 5 PM. CT scan of her head showed no acute intracranial abnormality. Teleneurologist was consulted and recommended TPA which was administered. There was improvement in right of left upper extremity but no other noticeable improvements. CT angiogram showed thrombus involving right middle cerebral artery with partial occlusion. Patient was transferred to [facility] for further management, including interventional radiology procedures. Angiogram showed occlusion of a distal MCA branch which was not amenable to thrombectomy. Patient was beyond time window for use of intra-arterial TPA. She was admitted to neuro intensive care unit for further management.    Procedures   CEREBRAL ANGIOGRAM [ION6295 (Custom)]      Expand All Collapse All   S/P 4 vessel cerebral arteriogram  Rt CFa approach. Findings. 1.Distal RT MCA angular branch clot migration ,into M3-M4 region      LSN: 4 PM on 08/20/2015 tPA Given: Yes 08/21/2015 at 2014 mRankin:   SUBJECTIVE (INTERVAL HISTORY) Her husband is   at the bedside.  Overall she feels her condition is controlled.   OBJECTIVE Temp:  [97.6 F (36.4 C)-98.9 F (37.2 C)] 97.6 F (36.4 C) (02/07 0952) Pulse Rate:  [84-99] 87 (02/07 0952) Cardiac Rhythm:  [-] Atrial fibrillation (02/07 0700) Resp:  [20] 20 (02/07 0952) BP: (108-158)/(79-104) 108/89 mmHg (02/07 0952) SpO2:  [94 %-97 %] 94 % (02/07 0952)  CBC:   Recent Labs Lab 08/20/15 1947 08/22/15 0941 08/23/15 0304  WBC 9.6 11.0* 9.2  NEUTROABS 7.9*  --   --   HGB 10.4* 10.3* 10.3*  HCT 31.1* 30.4* 29.7*  MCV 91.2 89.9 88.9  PLT 177 186 184    Basic  Metabolic Panel:   Recent Labs Lab 08/23/15 0304 08/23/15 0917  NA 137 139  K 3.2* 3.0*  CL 94* 94*  CO2 31 32  GLUCOSE 158* 165*  BUN 6 <5*  CREATININE 0.88 0.85  CALCIUM 9.5 9.6    Lipid Panel:     Component Value Date/Time   CHOL 117 08/21/2015 0435   TRIG 85 08/21/2015 0435   HDL 43 08/21/2015 0435   CHOLHDL 2.7 08/21/2015 0435   VLDL 17 08/21/2015 0435   LDLCALC 57 08/21/2015 0435   HgbA1c:  Lab Results  Component Value Date   HGBA1C 6.5* 08/21/2015   Urine Drug Screen:     Component Value Date/Time   LABOPIA POSITIVE* 08/22/2015 0931   COCAINSCRNUR NONE DETECTED 08/22/2015 0931   LABBENZ POSITIVE* 08/22/2015 0931   AMPHETMU NONE DETECTED 08/22/2015 0931   THCU NONE DETECTED 08/22/2015 0931   LABBARB NONE DETECTED 08/22/2015 0931      IMAGING  Ct Angio Head Neck W/cm &/or Wo Cm 08/20/2015   1. Positive for Emergent Large Vessel Occlusion: Right MCA bifurcation see series 604, image 16. I am advised that the patient is status post IV tPA and this lesion appears partially recannalized. Still, I recommend consideration for Neurointerventional endovascular clot retrieval.   2. Little to no CT changes of right MCA territory infarct are evident. No intracranial hemorrhage or mass effect.  3. Mild for age atherosclerosis. No other hemodynamically significant stenosis in the head or neck.  4. Pulmonary septal thickening may indicate mild or developing interstitial edema. There is pulmonary artery enlargement suggesting a degree of underlying pulmonary artery hypertension.    Ct Head Wo Contrast 08/20/2015   1. No CT evidence of acute infarction.  2. No intracranial hemorrhage.  3. Minimal white matter microvascular disease.   Mr Brain Wo Contrast 08/21/2015   Evolving moderate RIGHT MCA territory infarct ; basal ganglia hematomas, petechial hemorrhage RIGHT insula.  Local mass effect without midline shift.  Additional foci of acute ischemia LEFT cerebellum, RIGHT  parietal lobe (watershed).  Moderate chronic small vessel ischemic disease.   PHYSICAL EXAM HEENT- Normocephalic, no lesions, without obvious abnormality. Normal external eye and conjunctiva. Neck supple with no masses, nodes, nodules or enlargement. Cardiovascular - irregular rate and rhythm  Lungs - chest clear, no wheezing, rales, normal symmetric air entry Abdomen - soft, non-tender; bowel sounds normal; no masses, no organomegaly Extremities - no joint deformities, effusion, or inflammation and no edema  Neurologic Examination: Patient is awake laert able to follow commands and oriented x3.  She is in no acute distress. Dysarthria also improved  Pupils were equal and reacted normally to light. Extraocular movements were full. Patient had a gaze preference to the right side, however can cross midline. Left partial homonymous hemianopsia Left lower facial weakness  Motor exam showed minimal left sided drift with moderately severe weakness of left upper extremity proximally and distally, as well as moderate weakness proximally of left lower extremity. Strength of right extremities was normal.  Sensory Exam:  Patient reports decrease sensation to both lower extremities (left greater than right)  Coordination/Gait: deferred   ASSESSMENT/PLAN [name] is a 65 y.o. female with history of hypertension, diabetes mellitus, anxiety, depression, obesity, agorophobia with panic disorder, and chronic pain presenting with left facial droop, slurred speech, and left hemiparesis.  She received IV t-PA at [facility] emergency department. 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   MRA - not performed  Carotid Doppler - refer to CTA of the neck  2D Echo - EF 55-60% AFIB  noted  LDL - 57  HgbA1c pending  VTE prophylaxis - SCDs DIET SOFT Room service appropriate?: Yes; Fluid consistency:: Thin  No antithrombotic prior to admission, now on No antithrombotic secondary to TPA.  Patient counseled to be compliant with her antithrombotic medications  Ongoing aggressive stroke risk factor management  Therapy recommendations: home Disposition: home Hypertension  Blood pressure was mildly high Permissive hypertension (OK if < 220/120) but gradually normalize in 5-7 days  Hyperlipidemia  Home meds:  Lipitor 20 mg not resumed in hospital  LDL 57, goal < 70  Continue statin at discharge  Diabetes  HgbA1c pending, goal < 7.0  Uncontrolled  Other Stroke Risk Factors  Advanced age  Cigarette smoker, quit smoking   Obesity, Body mass index is 36.89 kg/(m^2).    Other Active Problems  Mild anemia clinically undetermined likely nutritional  Renal insufficiency     Hospital day # 4      Patient was seen and examined by me personally. I reviewed notes, independently viewed imaging studies, participated in medical decision making and plan of care. I have made additions or clarifications directly to the above note.  Documentation accurately reflects findings. The laboratory and radiographic studies were  personally reviewed by me.      Will  Resume ASA for 1 week for now <--given hemorrhagic transformation.and change to eliquis after 1 week and stop aspirin  Condition is improved by exam . Has PAF likely cause of stroke but due to hemorrhagic transformation will not start anticoagulation for atleast 1-  week  Long discussion with the patient and husband at the bedside and answered questions. Discharge home today. Follow-up as an outpatient in stroke clinic in 2 months.     SIGNED BY: Dr. Delia Heady, MD   To contact Stroke Continuity provider, please refer to [email or web address]. After hours, contact General Neurology

## 2015-08-24 NOTE — Progress Notes (Signed)
Discharge orders received, Pt for discharge home today with home health PT, OT per [facility]. IV d/c'd. D/c instructions verbalized understanding. Family at bedside to assist patient with discharge. Staff bought pt downstairs via wheelchair. 08/24/15 1544

## 2015-09-06 ENCOUNTER — Other Ambulatory Visit: Payer: Self-pay | Admitting: *Deleted

## 2015-09-06 NOTE — Patient Outreach (Signed)
[facility]) Care Management  09/06/2015  Megan [name] 03-Feb-1951 [id number]  EMMI-Stroke referral via red on dashboard (answered yes to sad, hopeless,  anxious of empty). Recent hospital admission 02/03-02/01/2016.  Telephone call to patient who was advised of reason for call. States she does not remember electronic phone call. Patient also voices that she is not feeling depressed or sad at this time. States recent admission to hospital with symptoms of facial drooping, left-sided weakness and slurred speech. States she collapsed to the floor and family member called 911 and she was admitted with stroke.  States she is currently taking blood thinner Eliquis and all other medications as prescribed by her doctor. States daughter-in-law is fixing her medicines for her. States her spouse is taking her to doctors' appointments and she has already had primary care follow up 08/27/2015. States also has another appointment scheduled for 2/21 and spouse will be taking her.  Reviewed other signs of stroke and importance of calling 911. Patient was able to state all of symptoms she experienced upon her admission.   Patient agrees to EMMI-Stroke follow up calls. She also agrees to receive educational materials about stroke and discussion of materials.   Plan>  Will formulate care plan. Go over entire list of medications. Follow up call; patient agrees to appointment set.   Colleen Can, RN BSN CCM Care Management Coordinator [facility] Care Management  [phone]

## 2015-09-10 ENCOUNTER — Encounter: Payer: Self-pay | Admitting: *Deleted

## 2015-09-13 ENCOUNTER — Ambulatory Visit: Payer: Self-pay | Admitting: *Deleted

## 2015-09-23 ENCOUNTER — Ambulatory Visit (INDEPENDENT_AMBULATORY_CARE_PROVIDER_SITE_OTHER): Payer: Commercial Managed Care - HMO | Admitting: Cardiovascular Disease

## 2015-09-23 ENCOUNTER — Encounter: Payer: Self-pay | Admitting: Cardiovascular Disease

## 2015-09-23 VITALS — BP 126/86 | HR 73 | Ht 62.0 in | Wt 194.0 lb

## 2015-09-23 DIAGNOSIS — I639 Cerebral infarction, unspecified: Secondary | ICD-10-CM | POA: Diagnosis not present

## 2015-09-23 DIAGNOSIS — I480 Paroxysmal atrial fibrillation: Secondary | ICD-10-CM | POA: Diagnosis not present

## 2015-09-23 DIAGNOSIS — I10 Essential (primary) hypertension: Secondary | ICD-10-CM | POA: Diagnosis not present

## 2015-09-23 NOTE — Progress Notes (Signed)
Patient ID: Megan [name], female   DOB: Apr 19, 1951, 65 y.o.   MRN: [id number]       CARDIOLOGY CONSULT NOTE  Patient ID: Megan [name] MRN: [id number] DOB/AGE: 25-Mar-1951 65 y.o.  Admit date: (Not on file) Primary Physician Milana Obey, MD  Reason for Consultation: atrial fibrillation  HPI: The patient is a 65 year old woman with a history of diabetes mellitus, paroxysmal atrial fibrillation, and hypertension who presents for the evaluation of atrial fibrillation. She also has a history of a CVA in 08/2015 necessitating lytic therapy.  Echocardiogram on 08/22/15 demonstrated normal left ventricular systolic function and regional wall motion, LVEF 55-60%, with mild tricuspid regurgitation. Left atrial size was normal.  ECG's from February 2017 demonstrated normal sinus rhythm.  The patient denies any symptoms of chest pain, palpitations, shortness of breath, lightheadedness, dizziness, leg swelling, orthopnea, PND, and syncope.       Allergies  Allergen Reactions  . Naproxen Other (See Comments)    Causes stomach burning    Current Outpatient Prescriptions  Medication Sig Dispense Refill  . apixaban (ELIQUIS) 5 MG TABS tablet Take 1 tablet (5 mg total) by mouth 2 (two) times daily. 60 tablet 2  . atorvastatin (LIPITOR) 20 MG tablet Take 20 mg by mouth at bedtime.     . Cholecalciferol (VITAMIN D-3 PO) Take 1 tablet by mouth daily.    . enalapril (VASOTEC) 20 MG tablet Take 20 mg by mouth 2 (two) times daily.     Marland Kitchen glipiZIDE (GLUCOTROL) 10 MG tablet Take 10 mg by mouth every morning. In the AM    . hydrochlorothiazide (HYDRODIURIL) 25 MG tablet Take 25 mg by mouth daily.      Marland Kitchen HYDROcodone-acetaminophen (NORCO) 10-325 MG per tablet Take 1 tablet by mouth every 6 (six) hours as needed for moderate pain.    Marland Kitchen levothyroxine (SYNTHROID, LEVOTHROID) 25 MCG tablet Take 25 mcg by mouth daily.    . metFORMIN (GLUCOPHAGE) 500 MG tablet Take 1,000 mg by mouth 2 (two)  times daily with a meal.     . metoprolol (TOPROL-XL) 50 MG 24 hr tablet Take 50 mg by mouth daily with lunch.     . pioglitazone (ACTOS) 15 MG tablet Take 15 mg by mouth daily.    . diazepam (VALIUM) 10 MG tablet     . traZODone (DESYREL) 150 MG tablet Take 150 mg by mouth at bedtime. Reported on 09/23/2015    . [DISCONTINUED] pantoprazole (PROTONIX) 40 MG tablet Take 1 tablet (40 mg total) by mouth daily. 30 tablet 11   No current facility-administered medications for this visit.    Past Medical History  Diagnosis Date  . HTN (hypertension)   . DM (diabetes mellitus) (HCC)   . Anxiety   . Depression   . Obesity   . Colon polyps   . Complication of anesthesia   . PONV (postoperative nausea and vomiting)   . Agoraphobia with panic disorder   . Chronic pain 03/08/15    Hydrocodone-APAP 10/325 mg, # 120 q month    Past Surgical History  Procedure Laterality Date  . Cardiac catheterization  2004    normal  . S/p hysterectomy      complete  . Abdominal hysterectomy  15 yrs ago    with bso at aph  . Cholecystectomy  25 yrs ago    [facility]  . Colonoscopy  05/04/11    left-sided colonic diverticulosis  . Esophagogastroduodenoscopy  05/04/2011    Abnormal distal esophagus, biopsies  consistent with inflammation due to acid reflux. No evidence of Barrett's. No H. pylori  . Radiology with anesthesia N/A 08/20/2015    Procedure: RADIOLOGY WITH ANESTHESIA;  Surgeon: Julieanne Cotton, MD;  Location: [facility] OR;  Service: Radiology;  Laterality: N/A;    Social History   Social History  . Marital Status: Married    Spouse Name: N/A  . Number of Children: 3  . Years of Education: N/A   Occupational History  . disability    Social History Main Topics  . Smoking status: Former Smoker -- 0.25 packs/day for 15 years    Types: Cigarettes    Start date: 07/17/1969    Quit date: 07/18/1971  . Smokeless tobacco: Not on file     Comment: one pack per month, never more than 1-2 cig/day in entire  life  . Alcohol Use: No  . Drug Use: No  . Sexual Activity: Yes    Birth Control/ Protection: Surgical   Other Topics Concern  . Not on file   Social History Narrative   No contact with children since they left home at age 13. Doesn't know where they are.     No family history of premature CAD in 1st degree relatives.  Prior to Admission medications   Medication Sig Start Date End Date Taking? Authorizing Provider  apixaban (ELIQUIS) 5 MG TABS tablet Take 1 tablet (5 mg total) by mouth 2 (two) times daily. 09/01/15   Layne Benton, NP  atorvastatin (LIPITOR) 20 MG tablet Take 20 mg by mouth at bedtime.     Historical Provider, MD  Cholecalciferol (VITAMIN D-3 PO) Take 1 tablet by mouth daily.    Historical Provider, MD  enalapril (VASOTEC) 20 MG tablet Take 20 mg by mouth 2 (two) times daily.     Historical Provider, MD  glipiZIDE (GLUCOTROL) 10 MG tablet Take 10 mg by mouth every morning. In the AM    Historical Provider, MD  hydrochlorothiazide (HYDRODIURIL) 25 MG tablet Take 25 mg by mouth daily.      Historical Provider, MD  HYDROcodone-acetaminophen (NORCO) 10-325 MG per tablet Take 1 tablet by mouth every 6 (six) hours as needed for moderate pain.    Historical Provider, MD  levothyroxine (SYNTHROID, LEVOTHROID) 25 MCG tablet Take 25 mcg by mouth daily. 07/14/15   Historical Provider, MD  metFORMIN (GLUCOPHAGE) 500 MG tablet Take 1,000 mg by mouth 2 (two) times daily with a meal.     Historical Provider, MD  metoprolol (TOPROL-XL) 50 MG 24 hr tablet Take 50 mg by mouth daily with lunch.     Historical Provider, MD  pioglitazone (ACTOS) 15 MG tablet Take 15 mg by mouth daily.    Historical Provider, MD  traZODone (DESYREL) 150 MG tablet Take 150 mg by mouth at bedtime. 07/14/15   Historical Provider, MD     Review of systems complete and found to be negative unless listed above in HPI     Physical exam Blood pressure 126/86, pulse 73, height  (1.575 m), weight 194 lb  (87.998 kg), SpO2 95 %. General: NAD Neck: No JVD, no thyromegaly or thyroid nodule.  Lungs: Clear to auscultation bilaterally with normal respiratory effort. CV: Nondisplaced PMI. Regular rate and rhythm, normal S1/S2, no S3/S4, no murmur.  No peripheral edema.  No carotid bruit.   Abdomen: Soft, nontender, obese.  Skin: Intact without lesions or rashes.  Neurologic: Alert and oriented x 3. Facial droop. Psych: Normal affect. Extremities: No clubbing or cyanosis.  HEENT: Normal.   ECG: Most recent ECG reviewed.  Labs:   Lab Results  Component Value Date   WBC 9.2 08/23/2015   HGB 10.3* 08/23/2015   HCT 29.7* 08/23/2015   MCV 88.9 08/23/2015   PLT 184 08/23/2015   No results for input(s): NA, K, CL, CO2, BUN, CREATININE, CALCIUM, PROT, BILITOT, ALKPHOS, ALT, AST, GLUCOSE in the last 168 hours.  Invalid input(s): LABALBU No results found for: CKTOTAL, CKMB, CKMBINDEX, TROPONINI  Lab Results  Component Value Date   CHOL 117 08/21/2015   Lab Results  Component Value Date   HDL 43 08/21/2015   Lab Results  Component Value Date   LDLCALC 57 08/21/2015   Lab Results  Component Value Date   TRIG 85 08/21/2015   Lab Results  Component Value Date   CHOLHDL 2.7 08/21/2015   No results found for: LDLDIRECT       Studies: No results found.  ASSESSMENT AND PLAN:  1. Paroxysmal atrial fibrillation: Symptomatically stable. I do not find any rhythm strips in the electronic medical record demonstrating atrial fibrillation. ECGs all demonstrate normal sinus rhythm. I presume this was observed on telemetry. Given the fact that her left atrial size is normal, she has a reasonable chance of maintaining sinus rhythm. She is also on AV nodal blocking agents with metoprolol which will potentially suppress any paroxysms. She will be maintained on Eliquis 5 mg twice daily.  2. Essential HTN: Controlled. No changes.  Dispo: f/u 6 months.   Signed: Prentice DockerSuresh Koneswaran, M.D.,  F.A.C.C.  09/23/2015, 8:37 AM

## 2015-09-23 NOTE — Patient Instructions (Signed)
Your physician wants you to follow-up in: 1 year You will receive a reminder letter in the mail two months in advance. If you don't receive a letter, please call our office to schedule the follow-up appointment.   Your physician recommends that you continue on your current medications as directed. Please refer to the Current Medication list given to you today.    If you need a refill on your cardiac medications before your next appointment, please call your pharmacy.      Thank you for choosing [facility] !        

## 2015-09-24 ENCOUNTER — Other Ambulatory Visit: Payer: Self-pay | Admitting: *Deleted

## 2015-09-24 NOTE — Patient Outreach (Signed)
[facility]) Care Management  09/24/2015  Megan LeekVirginia A [name] 11/24/1950 [id number]  EMMI-Stroke follow up:  Telephone call to patient who was able to give HIPPa verification. Patient voices that she has not had any hospital admission or emergency room visits since recent hospital admission for stroke. States she has not had any stroke symptoms that she can remember since admission. States she does have some short term memory loss since recent stroke. Patient states she did receive the educational material on stroke and  Has read some of it. RN CM reviewed signs and  symptoms of stroke with patient.   States she has seen primary care doctor twice since hospital discharge. Saw cardiologist yesterday and was advised that her heart was "fine." States no changes in medications were done. States her spouse sets up her medications and she takes medications consistently as prescribed by her MD.   States she has completed home health services with physical and speech therapy. Also states she is walking without assistance but has cane nearby if needed. States has had no falls. Falls prevention strategies discussed with patient and she voices understanding.   Plan: Will follow up with patient. Follow care plan as noted. Patient agrees with set time for follow up.  Colleen CanLinda Demon Volante, RN BSN CCM Care Management Coordinator [facility]  [phone]

## 2015-09-28 ENCOUNTER — Other Ambulatory Visit (HOSPITAL_COMMUNITY): Payer: Self-pay | Admitting: Family Medicine

## 2015-09-28 ENCOUNTER — Emergency Department (HOSPITAL_COMMUNITY): Admission: EM | Admit: 2015-09-28 | Payer: Commercial Managed Care - HMO | Source: Home / Self Care

## 2015-09-28 ENCOUNTER — Ambulatory Visit (HOSPITAL_COMMUNITY)
Admission: RE | Admit: 2015-09-28 | Discharge: 2015-09-28 | Disposition: A | Discharge status: Home / Self Care | Payer: Commercial Managed Care - HMO | Source: Ambulatory Visit | Attending: Family Medicine | Admitting: Family Medicine

## 2015-09-28 DIAGNOSIS — I63423 Cerebral infarction due to embolism of bilateral anterior cerebral arteries: Secondary | ICD-10-CM

## 2015-09-28 DIAGNOSIS — Z8673 Personal history of transient ischemic attack (TIA), and cerebral infarction without residual deficits: Secondary | ICD-10-CM | POA: Insufficient documentation

## 2015-09-28 MED ORDER — GADOBENATE DIMEGLUMINE 529 MG/ML IV SOLN: 17.0000 mL | Freq: Once | INTRAVENOUS | Status: DC | PRN

## 2015-10-01 ENCOUNTER — Other Ambulatory Visit: Payer: Self-pay | Admitting: *Deleted

## 2015-10-01 NOTE — Patient Outreach (Signed)
[facility]) Care Management  10/01/2015  Megan LeekVirginia A [name] 03/29/1951 [id number]   [name]; [name]   Colleen CanLinda Kamonte Mcmichen, RN BSN CCM Care Management Coordinator [facility]  [phone]

## 2015-10-08 ENCOUNTER — Other Ambulatory Visit: Payer: Self-pay | Admitting: *Deleted

## 2015-10-08 NOTE — Patient Outreach (Signed)
[facility] Care Management  10/08/2015  Megan LeekVirginia A [name] 05/22/1951 [id number]   EMMI-Stroke follow up/close out:  Telephone call to patient who gave HIPPA verification.  Patient voices that she has not been admitted to hospital since previous admission for stroke. States she has not had stroke symptoms but is able to identify & knows action plan.   States she has attended all of doctors' appointments and states has appointment with neurologist in April. States spouse will take her to appointment. No change in medications. Voices she take medicines consistently as prescribed by doctors. States she is doing regular household chores without problems.   Advised patient EMMI-Stroke follow up has been completed by this RN. States she does not have any concerns at this time. Patient states currently does not need any more Transport plannereducational materials.\  Plan; Will close case/sent to care management assistant to close.  Colleen CanLinda Epic Tribbett, RN BSN CCM Care Management Coordinator [facility]  [phone]

## 2015-10-28 ENCOUNTER — Ambulatory Visit (INDEPENDENT_AMBULATORY_CARE_PROVIDER_SITE_OTHER): Payer: Commercial Managed Care - HMO | Admitting: Neurology

## 2015-10-28 ENCOUNTER — Encounter: Payer: Self-pay | Admitting: Neurology

## 2015-10-28 VITALS — BP 125/74 | HR 78 | Ht 62.0 in | Wt 196.4 lb

## 2015-10-28 DIAGNOSIS — G8110 Spastic hemiplegia affecting unspecified side: Secondary | ICD-10-CM

## 2015-10-28 NOTE — Progress Notes (Signed)
[facility] [address] [place]. [place] [address] [phone]       OFFICE FOLLOW-UP NOTE  [name] Date of Birth:  May 23, 1951 Medical Record Number:  [id number]   HPI: [name] is a 65 year old Caucasian lady seen today for first office follow-up visit following hospital admission for stroke in February 2017. Megan [name] is an 65 y.o. female with a history of diabetes mellitus, hypertension and obesity who was seen in the ED at [facility] following acute onset of left facial droop, slurred speech and left-sided weakness. She was last known well at 4 PM today. Family found her on the floor home at 5 PM. CT scan of her head showed no acute intracranial abnormality. Teleneurologist was consulted and recommended TPA which was administered. There was improvement in right of left upper extremity but no other noticeable improvements. CT angiogram showed thrombus involving right middle cerebral artery with partial occlusion. Patient was transferred to [facility] for further management, including interventional radiology procedures. Angiogram showed occlusion of a distal MCA branch which was not amenable to thrombectomy. Patient was beyond time window for use of intra-arterial TPA. She was admitted to neuro intensive care unit for further management. Patient was kept in intensive care unit where blood pressure was tightly controlled. Patient did well and showed gradual improvement. MRI scan of the brain A showed moderate right MCA infarct with some basal ganglia hematoma and petechial hemorrhage in the right insula. Aspirin was held because of this an anticoagulation was not started. There are tiny areas of acute ischemia in the left cerebellum and right parietal lobe as well. There are moderate chronic changes of small vessel disease. Patient was found to have new onset atrial fibrillation on telemetry monitoring and this was transient. Carotid Doppler was not performed at  the patient previously had CT angiogram of the neck and brain. Transthoracic echo which showed EF of 55-60% in atrial fibrillation was noted he did LDL cholesterol was 57 mg percent. Hemoglobin A1c was 6.5%. Patient showed her clinical improvement but had partial left homonymous hemianopsia and mild left hemiparesis with mild hand weakness and moderate leg weakness. She was seen by physical occupational speech therapy and gradually improved and at the time of discharge was felt to be stable enough to be discharged home with home physical and occupational therapy which was arranged by case manager. Patient was started on aspirin 81 mg on the day of discharge with instructions to take it for one week and then change to eliquis 5 mg twice daily and stop aspirin. She states she's done well since discharge. She has finished home physical and occupational therapy. She is able to ambulate with a cane. Her left-sided peripheral vision has improved but left hand remains weak. She has trouble gripping objects and drops objects with the left hand. She can walk though short distances without assistance. She has not yet been referred for outpatient therapy. She is tolerating eliquis well without bleeding or bruising. She states her blood pressure is well controlled and today it is 125/75. She states her sugars have not been optimal and fasting sugar ranges from [id number]. She plans to discuss this with her primary physician. ROS:   14 system review of systems is positive for  left-sided weakness, gait difficulty, balance difficulties and all other systems negative.  PMH:  Past Medical History  Diagnosis Date  . HTN (hypertension)   . DM (diabetes mellitus) (HCC)   . Anxiety   . Depression   .  Obesity   . Colon polyps   . Complication of anesthesia   . PONV (postoperative nausea and vomiting)   . Agoraphobia with panic disorder   . Chronic pain 03/08/15    Hydrocodone-APAP 10/325 mg, # 120 q month  . Stroke [facility](HCC)      Social History:  Social History   Social History  . Marital Status: Married    Spouse Name: N/A  . Number of Children: 3  . Years of Education: N/A   Occupational History  . disability    Social History Main Topics  . Smoking status: Former Smoker -- 0.25 packs/day for 15 years    Types: Cigarettes    Start date: 07/17/1969    Quit date: 07/18/1971  . Smokeless tobacco: Not on file     Comment: one pack per month, never more than 1-2 cig/day in entire life  . Alcohol Use: No  . Drug Use: No  . Sexual Activity: Yes    Birth Control/ Protection: Surgical   Other Topics Concern  . Not on file   Social History Narrative   No contact with children since they left home at age 65. Doesn't know where they are.    Medications:   Current Outpatient Prescriptions on File Prior to Visit  Medication Sig Dispense Refill  . apixaban (ELIQUIS) 5 MG TABS tablet Take 1 tablet (5 mg total) by mouth 2 (two) times daily. 60 tablet 2  . atorvastatin (LIPITOR) 20 MG tablet Take 20 mg by mouth at bedtime.     . Cholecalciferol (VITAMIN D-3 PO) Take 1 tablet by mouth daily.    . diazepam (VALIUM) 10 MG tablet     . enalapril (VASOTEC) 20 MG tablet Take 20 mg by mouth 2 (two) times daily.     Marland Kitchen. glipiZIDE (GLUCOTROL) 10 MG tablet Take 10 mg by mouth every morning. In the AM    . hydrochlorothiazide (HYDRODIURIL) 25 MG tablet Take 25 mg by mouth daily.      Marland Kitchen. HYDROcodone-acetaminophen (NORCO) 10-325 MG per tablet Take 1 tablet by mouth every 6 (six) hours as needed for moderate pain.    Marland Kitchen. levothyroxine (SYNTHROID, LEVOTHROID) 25 MCG tablet Take 25 mcg by mouth daily.    . metFORMIN (GLUCOPHAGE) 500 MG tablet Take 1,000 mg by mouth 2 (two) times daily with a meal.     . metoprolol (TOPROL-XL) 50 MG 24 hr tablet Take 50 mg by mouth daily with lunch.     . pioglitazone (ACTOS) 15 MG tablet Take 15 mg by mouth daily.    . [DISCONTINUED] pantoprazole (PROTONIX) 40 MG tablet Take 1 tablet (40 mg  total) by mouth daily. 30 tablet 11   No current facility-administered medications on file prior to visit.    Allergies:   Allergies  Allergen Reactions  . Naproxen Other (See Comments)    Causes stomach burning    Physical Exam General: well developed, well nourished Middle-age Caucasian lady, seated, in no evident distress Head: head normocephalic and atraumatic.  Neck: supple with no carotid or supraclavicular bruits Cardiovascular: regular rate and rhythm, no murmurs Musculoskeletal: no deformity Skin:  no rash/petichiae Vascular:  Normal pulses all extremities Filed Vitals:   10/28/15 1304  BP: 125/74  Pulse: 78   Neurologic Exam Mental Status: Awake and fully alert. Oriented to place and time. Recent and remote memory intact. Attention span, concentration and fund of knowledge appropriate. Mood and affect appropriate.  Cranial Nerves: Fundoscopic exam reveals sharp disc margins.  Pupils equal, briskly reactive to light. Extraocular movements full without nystagmus. Visual fields full to confrontation. Hearing intact. Moderate left lower facial weakness. Facial sensation intact. Face, tongue, palate moves normally and symmetrically.  Motor:Mild spastic left hemiparesis with 3/5 left upper extremity strength with significant weakness of left grip and intrinsic hand muscles. Mild 4/5 left lower extremity strength with weakness of hip vigorous and ankle dorsiflexors. Tone is increased on the left side compared to the right. Normal strength on the right.  Sensory.: intact to touch ,pinprick .position and vibratory sensation.  Coordination: Impaired finger-to-nose and needle coordination on the left. Normal on the right. Gait and Station: Arises from chair without difficulty. Hemiplegic gait with slight dragging of the left foot with mild left foot drop.  Reflexes: 1+ and asymmetric scar on the left side. Toes downgoing.   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 Vascular risk factors of atrial fibrillation, hypertension, diabetes and hyperlipidemia    PLAN: I had a long d/w patient and her husband about her recent stroke,atrial fibrillation, risk for recurrent stroke/TIAs, personally independently reviewed imaging studies and stroke evaluation results and answered questions.Continue Eliquis for secondary stroke prevention and maintain strict control of hypertension with blood pressure goal below 130/90, diabetes with hemoglobin A1c goal below 6.5% and lipids with LDL cholesterol goal below 70 mg/dL. I also advised the patient to eat a healthy diet with plenty of whole grains, cereals, fruits and vegetables, exercise regularly and maintain ideal body weight .referred to outpatient physical and occupational therapy. Greater than 50% of time during this 25 minute visit was spent on counseling,explanation of diagnosis, planning of further management, discussion with patient and family and coordination of care.Followup in the future with stroke nurse practitioner in 3 months or call earlier if necessary. Delia Heady, MD   Note: This document was prepared with digital dictation and possible smart phrase technology. Any transcriptional errors that result from this process are unintentional

## 2015-10-28 NOTE — Patient Instructions (Signed)
I had a long d/w patient and her husband about her recent stroke,atrial fibrillation, risk for recurrent stroke/TIAs, personally independently reviewed imaging studies and stroke evaluation results and answered questions.Continue Eliquis for secondary stroke prevention and maintain strict control of hypertension with blood pressure goal below 130/90, diabetes with hemoglobin A1c goal below 6.5% and lipids with LDL cholesterol goal below 70 mg/dL. I also advised the patient to eat a healthy diet with plenty of whole grains, cereals, fruits and vegetables, exercise regularly and maintain ideal body weight .referred to outpatient physical and occupational therapy. Followup in the future with stroke nurse practitioner in 3 months or call earlier if necessary. Stroke Prevention Some medical conditions and behaviors are associated with an increased chance of having a stroke. You may prevent a stroke by making healthy choices and managing medical conditions. HOW CAN I REDUCE MY RISK OF HAVING A STROKE?   Stay physically active. Get at least 30 minutes of activity on most or all days.  Do not smoke. It may also be helpful to avoid exposure to secondhand smoke.  Limit alcohol use. Moderate alcohol use is considered to be:  No more than 2 drinks per day for men.  No more than 1 drink per day for nonpregnant women.  Eat healthy foods. This involves:  Eating 5 or more servings of fruits and vegetables a day.  Making dietary changes that address high blood pressure (hypertension), high cholesterol, diabetes, or obesity.  Manage your cholesterol levels.  Making food choices that are high in fiber and low in saturated fat, trans fat, and cholesterol may control cholesterol levels.  Take any prescribed medicines to control cholesterol as directed by your health care provider.  Manage your diabetes.  Controlling your carbohydrate and sugar intake is recommended to manage diabetes.  Take any prescribed  medicines to control diabetes as directed by your health care provider.  Control your hypertension.  Making food choices that are low in salt (sodium), saturated fat, trans fat, and cholesterol is recommended to manage hypertension.  Ask your health care provider if you need treatment to lower your blood pressure. Take any prescribed medicines to control hypertension as directed by your health care provider.  If you are 37-14 years of age, have your blood pressure checked every 3-5 years. If you are 41 years of age or older, have your blood pressure checked every year.  Maintain a healthy weight.  Reducing calorie intake and making food choices that are low in sodium, saturated fat, trans fat, and cholesterol are recommended to manage weight.  Stop drug abuse.  Avoid taking birth control pills.  Talk to your health care provider about the risks of taking birth control pills if you are over 15 years old, smoke, get migraines, or have ever had a blood clot.  Get evaluated for sleep disorders (sleep apnea).  Talk to your health care provider about getting a sleep evaluation if you snore a lot or have excessive sleepiness.  Take medicines only as directed by your health care provider.  For some people, aspirin or blood thinners (anticoagulants) are helpful in reducing the risk of forming abnormal blood clots that can lead to stroke. If you have the irregular heart rhythm of atrial fibrillation, you should be on a blood thinner unless there is a good reason you cannot take them.  Understand all your medicine instructions.  Make sure that other conditions (such as anemia or atherosclerosis) are addressed. SEEK IMMEDIATE MEDICAL CARE IF:   You have  sudden weakness or numbness of the face, arm, or leg, especially on one side of the body.  Your face or eyelid droops to one side.  You have sudden confusion.  You have trouble speaking (aphasia) or understanding.  You have sudden trouble  seeing in one or both eyes.  You have sudden trouble walking.  You have dizziness.  You have a loss of balance or coordination.  You have a sudden, severe headache with no known cause.  You have new chest pain or an irregular heartbeat. Any of these symptoms may represent a serious problem that is an emergency. Do not wait to see if the symptoms will go away. Get medical help at once. Call your local emergency services (911 in [place] Do not drive yourself to the hospital.   This information is not intended to replace advice given to you by your health care provider. Make sure you discuss any questions you have with your health care provider.   Document Released: 08/10/2004 Document Revised: 07/24/2014 Document Reviewed: 01/03/2013 Elsevier Interactive Patient Education [employer]2016 Elsevier Inc.

## 2015-11-18 ENCOUNTER — Ambulatory Visit (HOSPITAL_COMMUNITY): Payer: Medicare HMO | Attending: Neurology | Admitting: Physical Therapy

## 2015-11-18 ENCOUNTER — Encounter (HOSPITAL_COMMUNITY): Payer: Self-pay | Admitting: Physical Therapy

## 2015-11-18 DIAGNOSIS — R2689 Other abnormalities of gait and mobility: Secondary | ICD-10-CM | POA: Diagnosis present

## 2015-11-18 DIAGNOSIS — R278 Other lack of coordination: Secondary | ICD-10-CM | POA: Insufficient documentation

## 2015-11-18 DIAGNOSIS — Z9181 History of falling: Secondary | ICD-10-CM | POA: Diagnosis present

## 2015-11-18 DIAGNOSIS — M25562 Pain in left knee: Secondary | ICD-10-CM | POA: Diagnosis present

## 2015-11-18 DIAGNOSIS — M6281 Muscle weakness (generalized): Secondary | ICD-10-CM | POA: Diagnosis present

## 2015-11-18 DIAGNOSIS — R29898 Other symptoms and signs involving the musculoskeletal system: Secondary | ICD-10-CM

## 2015-11-18 DIAGNOSIS — R2681 Unsteadiness on feet: Secondary | ICD-10-CM | POA: Diagnosis present

## 2015-11-18 NOTE — Therapy (Signed)
[place] [facility] [address] [place], [place], [address] Phone: [phone]   Fax:  [phone]  Physical Therapy Evaluation  Patient Details  Name: Megan LeekVirginia A [name] MRN: [id number] Date of Birth: 01/26/1951 Referring Provider: Delia HeadyPramod Sethi  Encounter Date: 11/18/2015      PT End of Session - 11/18/15 1301    Visit Number 1   Number of Visits 24   Date for PT Re-Evaluation 12/16/15   Authorization Type [employer]   Authorization Time Period 11/18/15 to 01/13/16   Authorization - Visit Number 1   Authorization - Number of Visits 24   PT Start Time [phone]  due to paperwork   PT Stop Time 1036   PT Time Calculation (min) 45 min   Equipment Utilized During Treatment Gait belt   Activity Tolerance Patient tolerated treatment well   [facility] tasks assessed/performed      Past Medical History  Diagnosis Date  . HTN (hypertension)   . DM (diabetes mellitus) (HCC)   . Anxiety   . Depression   . Obesity   . Colon polyps   . Complication of anesthesia   . PONV (postoperative nausea and vomiting)   . Agoraphobia with panic disorder   . Chronic pain 03/08/15    Hydrocodone-APAP 10/325 mg, # 120 q month  . Stroke [facility](HCC)     Past Surgical History  Procedure Laterality Date  . Cardiac catheterization  2004    normal  . S/p hysterectomy      complete  . Abdominal hysterectomy  15 yrs ago    with bso at aph  . Cholecystectomy  25 yrs ago    [facility]  . Colonoscopy  05/04/11    left-sided colonic diverticulosis  . Esophagogastroduodenoscopy  05/04/2011    Abnormal distal esophagus, biopsies consistent with inflammation due to acid reflux. No evidence of Barrett's. No H. pylori  . Radiology with anesthesia N/A 08/20/2015    Procedure: RADIOLOGY WITH ANESTHESIA;  Surgeon: Julieanne CottonSanjeev Deveshwar, MD;  [facility];  Service: Radiology;  Laterality: N/A;    There were no vitals filed for this visit.        Subjective Assessment - 11/18/15 0954    Subjective Pt reports she had a stroke on 08/21/15 when she woke up she couldn't move. She attempted to stand 4x and fell onto her L knee until someone found her after 1 hour. She can't remember her hospital stay, but was told she had some rehab while there. She continues to have difficulty with slurred speed and use of her LUE/LLE. Also notes significant pain in her L knee and occasional buckling when she walks which has caused her to fall several times.   Pertinent History HTN, DM, obesity, stroke 08/21/15   Limitations --  in and out of bathtub   How long can you sit comfortably? unlimited   How long can you stand comfortably? 5-10 minutes until she gets dizzy   How long can you walk comfortably? 5150ft or so   Patient Stated Goals improve strength get back to PLOF   Currently in Pain? Yes   Pain Score 2    Pain Location Knee  along fibular head, LCL, joint line and laterall patellar retinaculum   Pain Orientation Left   Pain Descriptors / Indicators Sharp;Stabbing   Pain Type Chronic pain   Pain Radiating Towards none   Pain Onset More than a month ago   Pain Frequency Constant  Aggravating Factors  walking   Pain Relieving Factors sitting, pain meds and compression   Effect of Pain on Daily Activities difficutly walking   Multiple Pain Sites No            [facility] PT Assessment - 11/18/15 0001    Assessment   Medical Diagnosis Spastic hemiplegia non dominant side   Referring Provider Delia Heady   Onset Date/Surgical Date 08/21/15   Hand Dominance Right   Next MD Visit unsure   Prior Therapy hospital   Precautions   Precautions None   Restrictions   Weight Bearing Restrictions No   Balance Screen   Has the patient fallen in the past 6 months Yes   How many times? 12  LLE gives out   Has the patient had a decrease in activity level because of a fear of falling?  Yes   Is the patient reluctant to leave their home because of a fear  of falling?  Yes   Home Environment   Living Environment Private residence   Living Arrangements Spouse/significant other   Type of Home House   Home Access Stairs to enter  7 STE with handrails   Prior Function   Level of Independence Independent   Leisure gardening    Observation/Other Assessments   Observations Pt arrived wearing L neoprene sleeve on her L knee   Focus on Therapeutic Outcomes (FOTO)  81% limited   Sensation   Light Touch Appears Intact   Posture/Postural Control   Posture/Postural Control Postural limitations   Postural Limitations Rounded Shoulders;Forward head   ROM / Strength   AROM / PROM / Strength AROM;Strength   AROM   AROM Assessment Site --   Strength   Strength Assessment Site Hip;Knee;Ankle   Right/Left Hip Right;Left   Right Hip Flexion 4-/5   Right Hip Extension 2/5   Right Hip ABduction 4/5   Left Hip Flexion 3/5   Left Hip Extension 2/5   Left Hip ABduction 2+/5   Right/Left Knee Right;Left   Right Knee Flexion 5/5   Right Knee Extension 5/5   Left Knee Flexion 4-/5   Left Knee Extension 4+/5   Right/Left Ankle Right;Left   Right Ankle Dorsiflexion 4+/5   Left Ankle Dorsiflexion 4+/5   Palpation   Palpation comment TTP along L proximal fibular head, LCL, lateral jt line and patellar retinaculum   Bed Mobility   Bed Mobility Rolling Right;Rolling Left;Supine to Sit   Rolling Right 6: Modified independent (Device/Increase time)   Rolling Left 6: Modified independent (Device/Increase time)   Supine to Sit 4: Min assist   Supine to Sit Details (indicate cue type and reason) pt attempting to sit up    Ambulation/Gait   Ambulation/Gait Yes   Ambulation/Gait Assistance 4: Min guard   Ambulation Distance (Feet) 50 Feet   Assistive device None;Straight cane   Gait Pattern Decreased stance time - left;Decreased step length - right;Antalgic  noting occasional Lt knee buckling   Gait Comments Pt with increased difficulty sequencing SPC,  requiring max verbal cues from therapist to use correctly   Standardized Balance Assessment   Standardized Balance Assessment Timed Up and Go Test;Five Times Sit to Stand   Five times sit to stand comments  23.3 sec no UE   Timed Up and Go Test   TUG Comments 32.6  PT Education - 11/18/15 1259    Education provided Yes   Education Details eval findings and POC; possible need for [facility] to improve steadiness with gait; encouraged pt to make apt with ortho MD concerning her knee; reviewed correct technique with stairs and use of SPC.   Person(s) Educated Patient;Spouse   Methods Explanation;Demonstration;Verbal cues   Comprehension Verbalized understanding;Need further instruction          PT Short Term Goals - 11/18/15 1645    PT SHORT TERM GOAL #1   Title Pt will demo understanding and indep with her HEP, to be updated throughout her POC.   Time 2   Period Weeks   Status New   PT SHORT TERM GOAL #2   Title Pt will demo correct sequencing with SPC x25 ft and min cues from therapist, x3 trials.   Time 4   Period Weeks   Status New   PT SHORT TERM GOAL #3   Title Pt will demo improved safety awareness evident by her ability to ascend/descend 5 6" steps with reciprocal pattern and LRAD with supervision only.    Time 4   Period Weeks   Status New   PT SHORT TERM GOAL #4   Title Pt will demo improved knee strength to 5/5 MMT to improve safety and stability during ambulation and stair negotiation.    Time 4   Period Weeks   Status New   PT SHORT TERM GOAL #5   Title Pt will transfer supine to/from sit each direction with correct log roll technique and with supervision only, x5 trials to improve her independence at home.    Time 4   Period Weeks   Status New           PT Long Term Goals - 11/18/15 1649    PT LONG TERM GOAL #1   Title Pt will demo improved LE strength to atleast 4/5 to improve her functional mobility.   Time 8    Period Weeks   Status New   PT LONG TERM GOAL #2   Title Pt will demo improved functional strength and power evident by a decrease in her 5x sit to stand time to under 12 sec.   Time 8   Period Weeks   Status New   PT LONG TERM GOAL #3   Title Pt will demo improved TUG time to less than 14 sec to decrease her ris kof falls at home and in the community.    Time 8   Period Weeks   Status New   PT LONG TERM GOAL #4   Title --   Time --   Period --   Status --               Plan - 11/18/15 1641    Clinical Impression Statement Mrs. Feltman is a 65 yo F presenting to OPPT s/p CVA on 08/21/15 resulting in Lt sided hemiparesis. She demonstrates, poor balance, LLE weakness and knee pain/buckling which is impairing her ability to perform all bed mobility, transfers and safely ambulate and use the steps going into her home. In addition, her functional tests such as TUG and 5x sit to stand were well below what is recommended and puts her at high risk of falls in the community. Also noting pt with L step to pattern when ascending/descending steps which improved with therapist reviewing correct reciprocal technique using UE support and providing CGA. Pt demonstrated safety only when ascending steps using this  technique and therapist encouraged her to try this at home with spouse support. Also trialed use of SPC with improvement in stability with knee buckling, pt also noting she felt her safety had improved. Therapist encouraged pt to follow up with orthopedic MD concerning her Lt knee pain and buckling to rule out ligament/meniscal injury. Pt would benefit from skilled PT services at this time to improve her functional strength, mobility and balance to facilitate return to her PLOF. Discussed eval findings with the pt and she is in agreement with proposed POC and PT frequency.    Rehab Potential Good   Clinical Impairments Affecting Rehab Potential acuity of stroke   PT Frequency 3x / week   3x/week for first 4 weeks, decrease to 2x/week   PT Duration 8 weeks   PT Treatment/Interventions ADLs/Self Care Home Management;Cryotherapy;Patient/family education;Neuromuscular re-education;Balance training;Therapeutic exercise;Therapeutic activities;Functional mobility training;Stair training;Gait training;Manual techniques   PT Next Visit Plan Further assess dizziness to rule out BPPV; follow up about orthopedic apt for knee; gait training/stair training with SPC; basic LE strengthening (hip, abductors)   PT Home Exercise Plan initiate next visit   Recommended Other Services None at this time   Consulted and Agree with Plan of Care Patient      Patient will benefit from skilled therapeutic intervention in order to improve the following deficits and impairments:  Abnormal gait, Decreased activity tolerance, Decreased balance, Decreased endurance, Decreased coordination, Decreased safety awareness, Decreased mobility, Decreased knowledge of use of DME, Decreased strength, Difficulty walking, Dizziness, Impaired flexibility, Improper body mechanics, Postural dysfunction, Pain  Visit Diagnosis: Muscle weakness (generalized) - Plan: PT plan of care cert/re-cert  Unsteadiness on feet - Plan: PT plan of care cert/re-cert  Other abnormalities of gait and mobility - Plan: PT plan of care cert/re-cert  Pain in left knee - Plan: PT plan of care cert/re-cert  Other symptoms and signs involving the musculoskeletal system - Plan: PT plan of care cert/re-cert  History of falling - Plan: PT plan of care cert/re-cert      G-Codes - 12/16/15 1657    Functional Assessment Tool Used FOTO: 81% limited   Functional Limitation Mobility: Walking and moving around   Mobility: Walking and Moving Around Current Status ([id number]) At least 60 percent but less than 80 percent impaired, limited or restricted   Mobility: Walking and Moving Around Goal Status [phone]) At least 40 percent but less than 60 percent  impaired, limited or restricted       Problem List Patient Active Problem List   Diagnosis Date Noted  . Cerebral infarction due to unspecified mechanism   . Knee pain, acute   . Cephalalgia   . GERD (gastroesophageal reflux disease) 06/14/2011  . FH: colon cancer 04/05/2011  . History of colon polyps 04/05/2011  . Left sided abdominal pain 04/05/2011  . N&V (nausea and vomiting) 04/05/2011    5:20 PM,2015-12-16 Marylyn Ishihara PT, DPT [facility] [phone]  [facility] [address] [place], [place], [address] Phone: [phone]   Fax:  [phone]  Name: AUBREIGH FUERTE MRN: [id number] Date of Birth: 02-21-1951

## 2015-11-24 ENCOUNTER — Ambulatory Visit (HOSPITAL_COMMUNITY): Payer: Medicare HMO | Admitting: Physical Therapy

## 2015-11-24 DIAGNOSIS — R2689 Other abnormalities of gait and mobility: Secondary | ICD-10-CM

## 2015-11-24 DIAGNOSIS — M6281 Muscle weakness (generalized): Secondary | ICD-10-CM

## 2015-11-24 DIAGNOSIS — R2681 Unsteadiness on feet: Secondary | ICD-10-CM

## 2015-11-24 NOTE — Therapy (Signed)
[facility] [address] [place], [place], [address] Phone: [phone]   Fax:  [phone]  Physical Therapy Treatment  Patient Details  Name: Megan [name] MRN: [id number] Date of Birth: 11-Mar-1951 Referring Provider: Delia Heady  Encounter Date: 11/24/2015      PT End of Session - 11/24/15 1515    Visit Number 2   Number of Visits 24   Date for PT Re-Evaluation 12/16/15   Authorization Type [employer]   Authorization Time Period 11/18/15 to 01/13/16   Authorization - Visit Number 2   Authorization - Number of Visits 24   PT Start Time 1300   PT Stop Time 1345   PT Time Calculation (min) 45 min   Equipment Utilized During Treatment Gait belt   Activity Tolerance Patient tolerated treatment well   Behavior During Therapy [facility] for tasks assessed/performed      Past Medical History  Diagnosis Date  . HTN (hypertension)   . DM (diabetes mellitus) (HCC)   . Anxiety   . Depression   . Obesity   . Colon polyps   . Complication of anesthesia   . PONV (postoperative nausea and vomiting)   . Agoraphobia with panic disorder   . Chronic pain 03/08/15    Hydrocodone-APAP 10/325 mg, # 120 q month  . Stroke [facility])     Past Surgical History  Procedure Laterality Date  . Cardiac catheterization  2004    normal  . S/p hysterectomy      complete  . Abdominal hysterectomy  15 yrs ago    with bso at aph  . Cholecystectomy  25 yrs ago    [facility]  . Colonoscopy  05/04/11    left-sided colonic diverticulosis  . Esophagogastroduodenoscopy  05/04/2011    Abnormal distal esophagus, biopsies consistent with inflammation due to acid reflux. No evidence of Barrett's. No H. pylori  . Radiology with anesthesia N/A 08/20/2015    Procedure: RADIOLOGY WITH ANESTHESIA;  Surgeon: Julieanne Cotton, MD;  Location: [facility] OR;  Service: Radiology;  Laterality: N/A;    There were no vitals filed for this visit.      Subjective Assessment -  11/24/15 1310    Subjective Pt states she is doing much better since lasst session.  States shes had no dizziness and is getting outside more.  States the most challenging thing is getting into and out of the shower.  States she is able to bathe once she gets in there independently but is scared she is going to fall.  Currently withiout pain.   Currently in Pain? No/denies                         [facility] Adult PT Treatment/Exercise - 11/24/15 1508    Knee/Hip Exercises: Standing   Gait Training no AD 100 feet   Knee/Hip Exercises: Seated   Long Arc Quad Both;10 reps   Marching Both;10 reps   Sit to [employer] 10 reps;without UE support   Knee/Hip Exercises: Supine   Bridges 10 reps   Straight Leg Raises Both;10 reps   Other Supine Knee/Hip Exercises logroll technique   Other Supine Knee/Hip Exercises marching 10 reps   Knee/Hip Exercises: Sidelying   Hip ABduction Both;10 reps                PT Education - 11/24/15 1520    Education provided Yes   Education Details reviewed initial evaluation including goals and exercises  completeing for HEP (given by different venue).   Person(s) Educated Patient   Methods Explanation;Demonstration;Handout   Comprehension Verbalized understanding;Returned demonstration          PT Short Term Goals - 11/18/15 1645    PT SHORT TERM GOAL #1   Title Pt will demo understanding and indep with her HEP, to be updated throughout her POC.   Time 2   Period Weeks   Status New   PT SHORT TERM GOAL #2   Title Pt will demo correct sequencing with SPC x25 ft and min cues from therapist, x3 trials.   Time 4   Period Weeks   Status New   PT SHORT TERM GOAL #3   Title Pt will demo improved safety awareness evident by her ability to ascend/descend 5 6" steps with reciprocal pattern and LRAD with supervision only.    Time 4   Period Weeks   Status New   PT SHORT TERM GOAL #4   Title Pt will demo improved knee strength to 5/5 MMT to  improve safety and stability during ambulation and stair negotiation.    Time 4   Period Weeks   Status New   PT SHORT TERM GOAL #5   Title Pt will transfer supine to/from sit each direction with correct log roll technique and with supervision only, x5 trials to improve her independence at home.    Time 4   Period Weeks   Status New           PT Long Term Goals - 11/18/15 1649    PT LONG TERM GOAL #1   Title Pt will demo improved LE strength to atleast 4/5 to improve her functional mobility.   Time 8   Period Weeks   Status New   PT LONG TERM GOAL #2   Title Pt will demo improved functional strength and power evident by a decrease in her 5x sit to stand time to under 12 sec.   Time 8   Period Weeks   Status New   PT LONG TERM GOAL #3   Title Pt will demo improved TUG time to less than 14 sec to decrease her ris kof falls at home and in the community.    Time 8   Period Weeks   Status New   PT LONG TERM GOAL #4   Title --   Time --   Period --   Status --               Plan - 11/24/15 1516    Clinical Impression Statement Pt overall improved with increased stability and decreased fatigue since beginning therapy.  PT is completeing her HEP given at last venue of care.  Progressed with LE and trunk stabiltiy exercises.  Pt required manual assistance to keep in correct form.  Worked on Astronomer with abiltiy to complete independently by end of session.  Reveiwed initial evaluation and goals with patient as well.  Attempted prone exercises and gait with SPC, however pt declined both of these.   Rehab Potential Good   Clinical Impairments Affecting Rehab Potential acuity of stroke   PT Frequency 3x / week  3x/week for first 4 weeks, decrease to 2x/week   PT Duration 8 weeks   PT Treatment/Interventions ADLs/Self Care Home Management;Cryotherapy;Patient/family education;Neuromuscular re-education;Balance training;Therapeutic exercise;Therapeutic  activities;Functional mobility training;Stair training;Gait training;Manual techniques   PT Next Visit Plan Further assess dizziness to rule out BPPV when with a qualified therapist if needed.  Next session add standing exercises including hip abduction/extension, lunges and static balance actvitieis.  Advance to stair training and dynamic balance actvities.   PT Home Exercise Plan advance to standing exercises next session   Consulted and Agree with Plan of Care Patient      Patient will benefit from skilled therapeutic intervention in order to improve the following deficits and impairments:  Abnormal gait, Decreased activity tolerance, Decreased balance, Decreased endurance, Decreased coordination, Decreased safety awareness, Decreased mobility, Decreased knowledge of use of DME, Decreased strength, Difficulty walking, Dizziness, Impaired flexibility, Improper body mechanics, Postural dysfunction, Pain  Visit Diagnosis: Muscle weakness (generalized)  Unsteadiness on feet  Other abnormalities of gait and mobility     Problem List Patient Active Problem List   Diagnosis Date Noted  . Cerebral infarction due to unspecified mechanism   . Knee pain, acute   . Cephalalgia   . GERD (gastroesophageal reflux disease) 06/14/2011  . FH: colon cancer 04/05/2011  . History of colon polyps 04/05/2011  . Left sided abdominal pain 04/05/2011  . N&V (nausea and vomiting) 04/05/2011    Lurena Nidamy B Frazier, PTA/CLT [phone]  11/24/2015, 3:22 PM  [facility] [address] [place], [place], [address] Phone: [phone]   Fax:  [phone]  Name: Megan [name] MRN: [id number] Date of Birth: 07/20/1950

## 2015-11-25 ENCOUNTER — Other Ambulatory Visit: Payer: Self-pay

## 2015-11-25 NOTE — Patient Outreach (Signed)
Telephone outreach to patient to obtain mRS was successfully completed. mRS = 3 

## 2015-11-26 ENCOUNTER — Encounter (HOSPITAL_COMMUNITY): Payer: Self-pay | Admitting: Occupational Therapy

## 2015-11-26 ENCOUNTER — Ambulatory Visit (HOSPITAL_COMMUNITY): Payer: Medicare HMO

## 2015-11-26 ENCOUNTER — Ambulatory Visit (HOSPITAL_COMMUNITY): Payer: Medicare HMO | Admitting: Occupational Therapy

## 2015-11-26 DIAGNOSIS — M6281 Muscle weakness (generalized): Secondary | ICD-10-CM

## 2015-11-26 DIAGNOSIS — R29898 Other symptoms and signs involving the musculoskeletal system: Secondary | ICD-10-CM

## 2015-11-26 DIAGNOSIS — M25562 Pain in left knee: Secondary | ICD-10-CM

## 2015-11-26 DIAGNOSIS — Z9181 History of falling: Secondary | ICD-10-CM

## 2015-11-26 DIAGNOSIS — R2689 Other abnormalities of gait and mobility: Secondary | ICD-10-CM

## 2015-11-26 DIAGNOSIS — R2681 Unsteadiness on feet: Secondary | ICD-10-CM

## 2015-11-26 DIAGNOSIS — R278 Other lack of coordination: Secondary | ICD-10-CM

## 2015-11-26 NOTE — Therapy (Signed)
[facility] [address] [place], [place], [address] Phone: [phone]   Fax:  [phone]  Occupational Therapy Evaluation  Patient Details  Name: Megan LeekVirginia A [name] MRN: [id number] Date of Birth: 05/26/1951 Referring Provider: Delia HeadyPramod [name]  Encounter Date: 11/26/2015      OT End of Session - 11/26/15 1442    Visit Number 1   Number of Visits 8   Date for OT Re-Evaluation 01/25/16  mini reassessment 12/25/2015   Authorization Type [employer]   Authorization Time Period Before 10th visit   Authorization - Visit Number 1   Authorization - Number of Visits 10   OT Start Time 1349   OT Stop Time 1426   OT Time Calculation (min) 37 min   Activity Tolerance Patient tolerated treatment well   Behavior During Therapy [facility] for tasks assessed/performed      Past Medical History  Diagnosis Date  . HTN (hypertension)   . DM (diabetes mellitus) (HCC)   . Anxiety   . Depression   . Obesity   . Colon polyps   . Complication of anesthesia   . PONV (postoperative nausea and vomiting)   . Agoraphobia with panic disorder   . Chronic pain 03/08/15    Hydrocodone-APAP 10/325 mg, # 120 q month  . Stroke [facility](HCC)     Past Surgical History  Procedure Laterality Date  . Cardiac catheterization  2004    normal  . S/p hysterectomy      complete  . Abdominal hysterectomy  15 yrs ago    with bso at aph  . Cholecystectomy  25 yrs ago    [facility]  . Colonoscopy  05/04/11    left-sided colonic diverticulosis  . Esophagogastroduodenoscopy  05/04/2011    Abnormal distal esophagus, biopsies consistent with inflammation due to acid reflux. No evidence of Barrett's. No H. pylori  . Radiology with anesthesia N/A 08/20/2015    Procedure: RADIOLOGY WITH ANESTHESIA;  Surgeon: Julieanne CottonSanjeev Deveshwar, MD;  [facility];  Service: Radiology;  Laterality: N/A;    There were no vitals filed for this visit.      Subjective Assessment -  11/26/15 1439    Subjective  S: I'm only going to do what I want to do and nothing more.    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 Pt reports her husband is not allowing her to drive now, although she feels she is doing much better. Dr. Delia HeadyPramod [name] referred pt to occupational therapy for evaluation and treatment.    Special Tests FOTO Score: 29/100 (81% impairment)   Currently in Pain? No/denies           [facility] - 11/26/15 1351    Assessment   Diagnosis Left spastic hemiplegia   Referring Provider Megan [name]   Onset Date 08/21/15   Prior Therapy HHPT    Precautions   Precautions None   Restrictions   Weight Bearing Restrictions No   Balance Screen   Has the patient fallen in the past 6 months Yes   How many times? 15   Has the patient had a decrease in activity level because of a fear of falling?  No   Is the patient reluctant to leave their home because of a fear of falling?  No   Home  Environment   Family/patient expects to be discharged to: Private residence   Living Arrangements  Spouse/significant other   Available Help at Discharge Family   Lives With Spouse   Prior Function   Level of Independence Independent   Leisure gardening, reading   ADL   ADL comments Pt reports she is completing daily tasks with no difficulty, however mentions during evaluation that she is dropping weighted items such as glasses of milk. Pt requires increased time to complete tasks involving bilateral coordination due to weakness and decreased coordination in left hand.    Written Expression   Dominant Hand Right   Vision - History   Baseline Vision Wears glasses only for reading   Vision Assessment   Eye Alignment Within Functional Limits   Ocular Range of Motion Within Functional Limits   Alignment/Gaze Preference Within Defined Limits   Tracking/Visual Pursuits Able to track stimulus in all quads without  difficulty   Saccades Within functional limits   Convergence Within functional limits   Visual Fields No apparent deficits   Cognition   Overall Cognitive Status Within Functional Limits for tasks assessed   Observation/Other Assessments   Focus on Therapeutic Outcomes (FOTO)  81% limited   Sensation   Light Touch Appears Intact   Hot/Cold Appears Intact   Coordination   9 Hole Peg Test Right;Left   Right 9 Hole Peg Test 28.19"   Left 9 Hole Peg Test 42.05"   ROM / Strength   AROM / PROM / Strength AROM;Strength   AROM   Overall AROM  Within functional limits for tasks performed   AROM Assessment Site Shoulder   Right/Left Shoulder Left   Strength   Overall Strength Comments Assessed seated, shoulder ER/IR adducted   Strength Assessment Site Shoulder;Elbow;Hand;Wrist;Forearm   Right/Left Shoulder Left   Left Shoulder Flexion 4-/5   Left Shoulder ABduction 4-/5   Left Shoulder Internal Rotation 4/5   Left Shoulder External Rotation 4-/5   Right/Left Elbow Left   Left Elbow Flexion 4/5   Left Elbow Extension 4/5   Right/Left Forearm Left   Left Forearm Pronation 4/5   Left Forearm Supination 4/5   Right/Left Wrist Left   Left Wrist Flexion 4+/5   Left Wrist Extension 4+/5   Left Wrist Radial Deviation 4/5   Left Wrist Ulnar Deviation 4/5   Right/Left hand Left;Right   Right Hand Gross Grasp Functional   Right Hand Grip (lbs) 56   Right Hand Lateral Pinch 11 lbs   Right Hand 3 Point Pinch 14 lbs   Left Hand Gross Grasp Functional   Left Hand Grip (lbs) 18   Left Hand Lateral Pinch 6 lbs   Left Hand 3 Point Pinch 6 lbs                         OT Education - 11/26/15 1417    Education provided Yes   Education Details yellow theraputty exercises   Person(s) Educated Patient   Methods Explanation;Demonstration;Handout   Comprehension Verbalized understanding;Returned demonstration          OT Short Term Goals - 11/26/15 1447    OT SHORT TERM  GOAL #1   Title Pt will be provided with and educated on HEP.    Time 4   Period Weeks   Status New   OT SHORT TERM GOAL #2   Title Pt will increase LUE strength to 4/5 to increase ability to carry pots and pans using LUE as assist.    Time 4   Period Weeks   Status  New   OT SHORT TERM GOAL #3   Title Pt will increase LUE grip strength by 5# to increase ability to hold a gallon of milk.    Time 4   Period Weeks   Status New   OT SHORT TERM GOAL #4   Title Pt will increase LUE pinch strength by 2# to increase ability to complete cooking tasks using various utensils.    Time 4   Period Weeks   Status New           OT Long Term Goals - 12-19-2015 1450    OT LONG TERM GOAL #1   Title Pt will return to highest level of functioning and independence in daily tasks.    Time 8   Period Weeks   Status New   OT LONG TERM GOAL #2   Title Pt will increase LUE strength to 4+/5 to increase ability to complete household tasks with minimal rest breaks.    Time 8   OT LONG TERM GOAL #3   Title Pt will increase LUE grip strength by 10# to increase ability to complete gardening activities.    Time 8   Period Weeks   Status New   OT LONG TERM GOAL #4   Title Pt will increase LUE pinch strength to increase ability to weed gardens and harvest produce.    Time 8   Period Weeks   Status New   OT LONG TERM GOAL #5   Title Pt will increase LUE fine motor coordination to increase ability to complete daily tasks, such as tying shoes, in a timely manner.    Time 8   Period Weeks   Status New               Plan - 12/19/15 1443    Clinical Impression Statement A: Pt is a 65 y/o female s/p CVA on 08/21/15 presenting with LUE weakness and decreased coordination limiting ability to complete daily tasks. Pt reports she is doing much better and feels that she is able to do anything she needs to independently. However during session pt mentions difficulty or "challenge" with holding and carrying  weighted objects, LUE is fatigued easily. Pt was provided with yellow theraputty HEP to begin working on grip and pinch strength.    Rehab Potential Good   OT Frequency 1x / week   OT Duration 8 weeks   OT Treatment/Interventions Self-care/ADL training;DME and/or AE instruction;Passive range of motion;Patient/family education;Therapeutic exercise;Manual Therapy;Therapeutic activities   Plan P: Pt will benefit from skilled OT services to increase LUE strength, including grip and pinch, and coordination to improve participation and functioning during daily and leisure tasks. Treatment plan: A/ROM, general LUE strengthening, grip strengthening, pinch strengthening, fine motor coordination exercises, patient education.    OT Home Exercise Plan yellow theraputty   Consulted and Agree with Plan of Care Patient      Patient will benefit from skilled therapeutic intervention in order to improve the following deficits and impairments:  Decreased strength, Impaired UE functional use, Decreased range of motion, Increased fascial restricitons, Decreased coordination, Decreased activity tolerance  Visit Diagnosis: Other symptoms and signs involving the musculoskeletal system  Muscle weakness (generalized)  Other lack of coordination      G-Codes - 12-19-2015 1454    Functional Assessment Tool Used clinical judgement   Functional Limitation Carrying, moving and handling objects   Carrying, Moving and Handling Objects Current Status (B1478) At least 20 percent but less than 40 percent impaired,  limited or restricted   Carrying, Moving and Handling Objects Goal Status [id number]) At least 1 percent but less than 20 percent impaired, limited or restricted      Problem List Patient Active Problem List   Diagnosis Date Noted  . Cerebral infarction due to unspecified mechanism   . Knee pain, acute   . Cephalalgia   . GERD (gastroesophageal reflux disease) 06/14/2011  . FH: colon cancer 04/05/2011  .  History of colon polyps 04/05/2011  . Left sided abdominal pain 04/05/2011  . N&V (nausea and vomiting) 04/05/2011   Ezra Sites, OTR/L  [phone]  11/26/2015, 2:55 PM  [facility] [address] [place], [place], [address] Phone: [phone]   Fax:  [phone]  Name: AANIYAH STROHM MRN: [id number] Date of Birth: Jan 25, 1951

## 2015-11-26 NOTE — Therapy (Signed)
[facility] [address] , [place], [address] Phone: [phone]   Fax:  [phone]  Physical Therapy Treatment  Patient Details  Name: Megan LeekVirginia A Bourdeau MRN: [id number] Date of Birth: 11/02/1950 Referring Provider: Delia HeadyPramod Sethi  Encounter Date: 11/26/2015      PT End of Session - 11/26/15 1518    Visit Number 3   Number of Visits 24   Date for PT Re-Evaluation 12/16/15   Authorization Type [employer]   Authorization Time Period 11/18/15 to 01/13/16   Authorization - Visit Number 3   Authorization - Number of Visits 24   PT Start Time 1441   PT Stop Time 1519   PT Time Calculation (min) 38 min   Equipment Utilized During Treatment Gait belt   Activity Tolerance Patient tolerated treatment well   Behavior During Therapy [facility] for tasks assessed/performed      Past Medical History  Diagnosis Date  . HTN (hypertension)   . DM (diabetes mellitus) (HCC)   . Anxiety   . Depression   . Obesity   . Colon polyps   . Complication of anesthesia   . PONV (postoperative nausea and vomiting)   . Agoraphobia with panic disorder   . Chronic pain 03/08/15    Hydrocodone-APAP 10/325 mg, # 120 q month  . Stroke [facility](HCC)     Past Surgical History  Procedure Laterality Date  . Cardiac catheterization  2004    normal  . S/p hysterectomy      complete  . Abdominal hysterectomy  15 yrs ago    with bso at aph  . Cholecystectomy  25 yrs ago    [facility]  . Colonoscopy  05/04/11    left-sided colonic diverticulosis  . Esophagogastroduodenoscopy  05/04/2011    Abnormal distal esophagus, biopsies consistent with inflammation due to acid reflux. No evidence of Barrett's. No H. pylori  . Radiology with anesthesia N/A 08/20/2015    Procedure: RADIOLOGY WITH ANESTHESIA;  Surgeon: Julieanne CottonSanjeev Deveshwar, MD;  [facility];  Service: Radiology;  Laterality: N/A;    There were no vitals filed for this visit.      Subjective Assessment -  11/26/15 1449    Subjective Pt reports everything has been going well. She doesn't understand why she needs to be in therapy, saying that she has almost returned to baseline. She has been working diligently on her LAQ at home. She denies any pain or other complaint today.    Pertinent History HTN, DM, obesity, stroke 08/21/15   Currently in Pain? No/denies                         [facility] Adult PT Treatment/Exercise - 11/26/15 0001    Knee/Hip Exercises: Standing   Gait Training 2 trials: 22340ft, 27025ft  No AD, cues for increased Left step length   Knee/Hip Exercises: Seated   Long Arc Quad 2 sets;10 reps  3#, tolerated well, could increase next session.    Marching Both;10 reps;2 sets  3# (tolerated well, increase next session)   Sit to Sand 10 reps;without UE support;2 sets  2-inch board under R foot             Balance Exercises - 11/26/15 1504    Balance Exercises: Standing   Standing Eyes Opened Narrow base of support (BOS);Solid surface;30 secs;2 reps  Ball rotation, physioball   Standing Eyes Closed Solid surface;Narrow base of support (BOS);3 reps;30 secs  Tandem Stance Eyes open;2 reps;30 secs  semi-tandem           PT Education - 11/26/15 1516    Education provided Yes   Education Details Continue to work on [employer] and progressing walking distance   Person(s) Educated Patient   Methods Explanation   Comprehension Verbalized understanding          PT Short Term Goals - 11/18/15 1645    PT SHORT TERM GOAL #1   Title Pt will demo understanding and indep with her HEP, to be updated throughout her POC.   Time 2   Period Weeks   Status New   PT SHORT TERM GOAL #2   Title Pt will demo correct sequencing with SPC x25 ft and min cues from therapist, x3 trials.   Time 4   Period Weeks   Status New   PT SHORT TERM GOAL #3   Title Pt will demo improved safety awareness evident by her ability to ascend/descend 5 6" steps with reciprocal pattern and  LRAD with supervision only.    Time 4   Period Weeks   Status New   PT SHORT TERM GOAL #4   Title Pt will demo improved knee strength to 5/5 MMT to improve safety and stability during ambulation and stair negotiation.    Time 4   Period Weeks   Status New   PT SHORT TERM GOAL #5   Title Pt will transfer supine to/from sit each direction with correct log roll technique and with supervision only, x5 trials to improve her independence at home.    Time 4   Period Weeks   Status New           PT Long Term Goals - 11/18/15 1649    PT LONG TERM GOAL #1   Title Pt will demo improved LE strength to atleast 4/5 to improve her functional mobility.   Time 8   Period Weeks   Status New   PT LONG TERM GOAL #2   Title Pt will demo improved functional strength and power evident by a decrease in her 5x sit to stand time to under 12 sec.   Time 8   Period Weeks   Status New   PT LONG TERM GOAL #3   Title Pt will demo improved TUG time to less than 14 sec to decrease her ris kof falls at home and in the community.    Time 8   Period Weeks   Status New   PT LONG TERM GOAL #4   Title --   Time --   Period --   Status --               Plan - 11/26/15 1519    Clinical Impression Statement Pt making good sprogess in strength, ambulaiton tolerance, showing progress toward all goals. Included new balance activities today, which were challenging, but tolerated well. Pt required several rest breaks. No dizziness noted.    Clinical Impairments Affecting Rehab Potential acuity of stroke   PT Frequency 3x / week   PT Duration 8 weeks   PT Treatment/Interventions ADLs/Self Care Home Management;Cryotherapy;Patient/family education;Neuromuscular re-education;Balance training;Therapeutic exercise;Therapeutic activities;Functional mobility training;Stair training;Gait training;Manual techniques   PT Next Visit Plan No noted dizziness issues, contn to progress balance activity and ambulation  distance/quality.    PT Home Exercise Plan advance to standing exercises next session   Consulted and Agree with Plan of Care Patient      Patient will  benefit from skilled therapeutic intervention in order to improve the following deficits and impairments:  Abnormal gait, Decreased activity tolerance, Decreased balance, Decreased endurance, Decreased coordination, Decreased safety awareness, Decreased mobility, Decreased knowledge of use of DME, Decreased strength, Difficulty walking, Dizziness, Impaired flexibility, Improper body mechanics, Postural dysfunction, Pain  Visit Diagnosis: Muscle weakness (generalized)  Unsteadiness on feet  Other abnormalities of gait and mobility  Pain in left knee  Other symptoms and signs involving the musculoskeletal system  History of falling     Problem List Patient Active Problem List   Diagnosis Date Noted  . Cerebral infarction due to unspecified mechanism   . Knee pain, acute   . Cephalalgia   . GERD (gastroesophageal reflux disease) 06/14/2011  . FH: colon cancer 04/05/2011  . History of colon polyps 04/05/2011  . Left sided abdominal pain 04/05/2011  . N&V (nausea and vomiting) 04/05/2011    3:22 PM, 11/26/2015 Rosamaria Lints, PT, DPT PRN Physical Therapist - [place] License # [id number] [phone] (mobile)   [facility] [address] [place], [place], [address] Phone: [phone]   Fax:  [phone]  Name: ADANYA SOSINSKI MRN: [id number] Date of Birth: Jun 12, 1951

## 2015-11-26 NOTE — Patient Instructions (Signed)
Home Exercises Program °Theraputty Exercises °   °Do the following exercises 1-2 times a day using your affected hand.  °1. Roll putty into a ball. ° °2. Make into a pancake. ° °3. Roll putty into a roll. ° °4. Pinch along log with first finger and thumb.  ° °5. Make into a ball. ° °6. Roll it back into a log.  ° °7. Pinch using thumb and side of first finger. ° °8. Roll into a ball, then flatten into a pancake. ° °9. Using your fingers, make putty into a mountain. ° °

## 2015-11-29 ENCOUNTER — Encounter (HOSPITAL_COMMUNITY): Payer: Commercial Managed Care - HMO

## 2015-11-29 ENCOUNTER — Telehealth (HOSPITAL_COMMUNITY): Payer: Self-pay

## 2015-11-29 NOTE — Telephone Encounter (Signed)
She had a death in the family and can not come in today, Pateint also states she doesn't like playing with play-dooh and wants to stop OT. She will take to OT at her next apptment about being D/C. NF

## 2015-11-30 ENCOUNTER — Ambulatory Visit (HOSPITAL_COMMUNITY): Payer: Medicare HMO | Admitting: Physical Therapy

## 2015-11-30 DIAGNOSIS — R2681 Unsteadiness on feet: Secondary | ICD-10-CM

## 2015-11-30 DIAGNOSIS — R278 Other lack of coordination: Secondary | ICD-10-CM

## 2015-11-30 DIAGNOSIS — R29898 Other symptoms and signs involving the musculoskeletal system: Secondary | ICD-10-CM

## 2015-11-30 DIAGNOSIS — Z9181 History of falling: Secondary | ICD-10-CM

## 2015-11-30 DIAGNOSIS — R2689 Other abnormalities of gait and mobility: Secondary | ICD-10-CM

## 2015-11-30 DIAGNOSIS — M25562 Pain in left knee: Secondary | ICD-10-CM

## 2015-11-30 DIAGNOSIS — M6281 Muscle weakness (generalized): Secondary | ICD-10-CM | POA: Diagnosis not present

## 2015-11-30 NOTE — Therapy (Signed)
[facility] [address] [place], [place], [address] Phone: [phone]   Fax:  [phone]  Physical Therapy Treatment  Patient Details  Name: Megan [name] MRN: [id number] Date of Birth: 11/23/1950 Referring Provider: Delia Heady  Encounter Date: 11/30/2015      PT End of Session - 11/30/15 1603    Visit Number 3   Number of Visits 24   Date for PT Re-Evaluation 12/16/15   Authorization Type [employer]   Authorization Time Period 11/18/15 to 01/13/16   Authorization - Visit Number 3   Authorization - Number of Visits 24   PT Start Time 1512   PT Stop Time 1557   PT Time Calculation (min) 45 min   Equipment Utilized During Treatment Gait belt   Activity Tolerance Patient tolerated treatment well   Behavior During Therapy [facility] for tasks assessed/performed      Past Medical History  Diagnosis Date  . HTN (hypertension)   . DM (diabetes mellitus) (HCC)   . Anxiety   . Depression   . Obesity   . Colon polyps   . Complication of anesthesia   . PONV (postoperative nausea and vomiting)   . Agoraphobia with panic disorder   . Chronic pain 03/08/15    Hydrocodone-APAP 10/325 mg, # 120 q month  . Stroke [facility])     Past Surgical History  Procedure Laterality Date  . Cardiac catheterization  2004    normal  . S/p hysterectomy      complete  . Abdominal hysterectomy  15 yrs ago    with bso at aph  . Cholecystectomy  25 yrs ago    [facility]  . Colonoscopy  05/04/11    left-sided colonic diverticulosis  . Esophagogastroduodenoscopy  05/04/2011    Abnormal distal esophagus, biopsies consistent with inflammation due to acid reflux. No evidence of Barrett's. No H. pylori  . Radiology with anesthesia N/A 08/20/2015    Procedure: RADIOLOGY WITH ANESTHESIA;  Surgeon: Julieanne Cotton, MD;  Location[facility] OR;  Service: Radiology;  Laterality: N/A;    There were no vitals filed for this visit.      Subjective Assessment -  11/30/15 1514    Subjective Pt reports she is doing good, no pain or dizziness reported. States she would like to get done with all of this so she can stop taking time out of her day. She says she is back to where she was before other than being more cautious. No other complaints at this.    Pertinent History HTN, DM, obesity, stroke 08/21/15   Patient Stated Goals improve strength get back to PLOF   Currently in Pain? No/denies                         [name] Adult PT Treatment/Exercise - 11/30/15 0001    Knee/Hip Exercises: Standing   Functional Squat 1 set;10 reps   Functional Squat Limitations BUE support on // bars             Balance Exercises - 11/30/15 1522    Balance Exercises: Standing   Standing Eyes Opened Narrow base of support (BOS)  physioball rotation   Standing Eyes Closed Narrow base of support (BOS);Foam/compliant surface;2 reps;30 secs   Tandem Stance Eyes open;Foam/compliant surface;2 reps;30 secs   Rockerboard Anterior/posterior;Lateral;EO;UE support  x2 min each   Tandem Gait 3 reps;Forward   Retro Gait 3 reps  decreased step length Lt, max verbal  cues to correct           PT Education - 11/30/15 1600    Education provided Yes   Education Details HEP adherence; discussed importance of balance training to improve safety with activty; updated HEP   Person(s) Educated Patient   Methods Explanation;Demonstration;Handout   Comprehension Verbalized understanding;Returned demonstration;Need further instruction          PT Short Term Goals - 11/18/15 1645    PT SHORT TERM GOAL #1   Title Pt will demo understanding and indep with her HEP, to be updated throughout her POC.   Time 2   Period Weeks   Status New   PT SHORT TERM GOAL #2   Title Pt will demo correct sequencing with SPC x25 ft and min cues from therapist, x3 trials.   Time 4   Period Weeks   Status New   PT SHORT TERM GOAL #3   Title Pt will demo improved safety awareness  evident by her ability to ascend/descend 5 6" steps with reciprocal pattern and LRAD with supervision only.    Time 4   Period Weeks   Status New   PT SHORT TERM GOAL #4   Title Pt will demo improved knee strength to 5/5 MMT to improve safety and stability during ambulation and stair negotiation.    Time 4   Period Weeks   Status New   PT SHORT TERM GOAL #5   Title Pt will transfer supine to/from sit each direction with correct log roll technique and with supervision only, x5 trials to improve her independence at home.    Time 4   Period Weeks   Status New           PT Long Term Goals - 11/18/15 1649    PT LONG TERM GOAL #1   Title Pt will demo improved LE strength to atleast 4/5 to improve her functional mobility.   Time 8   Period Weeks   Status New   PT LONG TERM GOAL #2   Title Pt will demo improved functional strength and power evident by a decrease in her 5x sit to stand time to under 12 sec.   Time 8   Period Weeks   Status New   PT LONG TERM GOAL #3   Title Pt will demo improved TUG time to less than 14 sec to decrease her ris kof falls at home and in the community.    Time 8   Period Weeks   Status New   PT LONG TERM GOAL #4   Title --   Time --   Period --   Status --               Plan - 11/30/15 1604    Clinical Impression Statement Pt demonstrating progress towards goals evident by her ability to ascend/descend steps using reciprocal pattern with handrail and supervision only. Today's session continued with balance activity challenging both her static and dynamic balance. Pt requiring a couple of rest breaks throughout as well as increased difficulty with retro stepping noting decreased step length Lt. Therapist updated HEP and encouraged continued adherence with pt verbalizing understanding if she remembers. Will continue with current POC.   Rehab Potential Good   Clinical Impairments Affecting Rehab Potential acuity of stroke   PT Frequency 3x /  week   PT Duration 8 weeks   PT Treatment/Interventions ADLs/Self Care Home Management;Cryotherapy;Patient/family education;Neuromuscular re-education;Balance training;Therapeutic exercise;Therapeutic activities;Functional mobility training;Stair training;Gait training;Manual techniques  PT Next Visit Plan dynamic and static balance; acitivity to improve hip flexion/foot clearance during ambulation   PT Home Exercise Plan updated with tandem stance, squats   Consulted and Agree with Plan of Care Patient      Patient will benefit from skilled therapeutic intervention in order to improve the following deficits and impairments:  Abnormal gait, Decreased activity tolerance, Decreased balance, Decreased endurance, Decreased coordination, Decreased safety awareness, Decreased mobility, Decreased knowledge of use of DME, Decreased strength, Difficulty walking, Dizziness, Impaired flexibility, Improper body mechanics, Postural dysfunction, Pain  Visit Diagnosis: Other symptoms and signs involving the musculoskeletal system  Muscle weakness (generalized)  Other lack of coordination  Unsteadiness on feet  Other abnormalities of gait and mobility  Pain in left knee  History of falling     Problem List Patient Active Problem List   Diagnosis Date Noted  . Cerebral infarction due to unspecified mechanism   . Knee pain, acute   . Cephalalgia   . GERD (gastroesophageal reflux disease) 06/14/2011  . FH: colon cancer 04/05/2011  . History of colon polyps 04/05/2011  . Left sided abdominal pain 04/05/2011  . N&V (nausea and vomiting) 04/05/2011    4:13 PM,11/30/2015 Marylyn Ishihara PT, DPT [facility] [phone]  [facility] [address] [place], [place], [address] Phone: [phone]   Fax:  [phone]  Name: Megan [name] MRN: [id number] Date of Birth: 1951/03/27

## 2015-12-03 ENCOUNTER — Ambulatory Visit (HOSPITAL_COMMUNITY): Payer: Medicare HMO | Admitting: Physical Therapy

## 2015-12-03 DIAGNOSIS — M25562 Pain in left knee: Secondary | ICD-10-CM

## 2015-12-03 DIAGNOSIS — M6281 Muscle weakness (generalized): Secondary | ICD-10-CM | POA: Diagnosis not present

## 2015-12-03 DIAGNOSIS — R2689 Other abnormalities of gait and mobility: Secondary | ICD-10-CM

## 2015-12-03 DIAGNOSIS — R278 Other lack of coordination: Secondary | ICD-10-CM

## 2015-12-03 DIAGNOSIS — R29898 Other symptoms and signs involving the musculoskeletal system: Secondary | ICD-10-CM

## 2015-12-03 DIAGNOSIS — Z9181 History of falling: Secondary | ICD-10-CM

## 2015-12-03 DIAGNOSIS — R2681 Unsteadiness on feet: Secondary | ICD-10-CM

## 2015-12-03 NOTE — Therapy (Signed)
[facility] [address] [place], [place], [address] Phone: [phone]   Fax:  [phone]  Physical Therapy Treatment  Patient Details  Name: Casimer LeekVirginia A Vea MRN: [id number] Date of Birth: 09/27/1950 Referring Provider: Delia HeadyPramod Sethi  Encounter Date: 12/03/2015      PT End of Session - 12/03/15 1605    Visit Number 4   Number of Visits 24   Date for PT Re-Evaluation 12/16/15   Authorization Type [employer]   Authorization Time Period 11/18/15 to 01/13/16   Authorization - Visit Number 4   Authorization - Number of Visits 24   PT Start Time 1517   PT Stop Time 1558   PT Time Calculation (min) 41 min   Equipment Utilized During Treatment Gait belt   Activity Tolerance Patient tolerated treatment well   Behavior During Therapy [facility] for tasks assessed/performed      Past Medical History  Diagnosis Date  . HTN (hypertension)   . DM (diabetes mellitus) (HCC)   . Anxiety   . Depression   . Obesity   . Colon polyps   . Complication of anesthesia   . PONV (postoperative nausea and vomiting)   . Agoraphobia with panic disorder   . Chronic pain 03/08/15    Hydrocodone-APAP 10/325 mg, # 120 q month  . Stroke [facility](HCC)     Past Surgical History  Procedure Laterality Date  . Cardiac catheterization  2004    normal  . S/p hysterectomy      complete  . Abdominal hysterectomy  15 yrs ago    with bso at aph  . Cholecystectomy  25 yrs ago    [facility]  . Colonoscopy  05/04/11    left-sided colonic diverticulosis  . Esophagogastroduodenoscopy  05/04/2011    Abnormal distal esophagus, biopsies consistent with inflammation due to acid reflux. No evidence of Barrett's. No H. pylori  . Radiology with anesthesia N/A 08/20/2015    Procedure: RADIOLOGY WITH ANESTHESIA;  Surgeon: Julieanne CottonSanjeev Deveshwar, MD;  [facility];  Service: Radiology;  Laterality: N/A;    There were no vitals filed for this visit.      Subjective Assessment -  12/03/15 1520    Subjective Pt states she was sore for 1 day after her last session. She is doing ok now though. She did her squat yesterday. No other complaints   Pertinent History HTN, DM, obesity, stroke 08/21/15   Patient Stated Goals improve strength get back to PLOF   Currently in Pain? No/denies                         [facility] Adult PT Treatment/Exercise - 12/03/15 0001    Knee/Hip Exercises: Standing   Heel Raises Both;1 set;20 reps   Heel Raises Limitations toe raises   Hip Flexion Both;2 sets;15 reps;Knee bent  no UE support   Forward Step Up Both;Hand Hold: 2;Step Height: 8";Hand Hold: 1;2 sets  2nd set with 1 HHA   Forward Step Up Limitations rest break between legs             Balance Exercises - 12/03/15 1601    Balance Exercises: Standing   SLS Eyes open;Upper extremity support 1;4 reps;10 secs  unable to hold without UE support   Tandem Gait Foam/compliant surface;3 reps  LOB x5 with CGA to MinA           PT Education - 12/03/15 1602    Education provided Yes  Education Details continued HEP adherence; educated on DOMS and reasoning for ankles being sore after last session; encoaurged pt to go pick up her glasses to decrease risk of falls   Person(s) Educated Patient   Methods Explanation;Demonstration   Comprehension Verbalized understanding;Returned demonstration;Need further instruction          PT Short Term Goals - 11/18/15 1645    PT SHORT TERM GOAL #1   Title Pt will demo understanding and indep with her HEP, to be updated throughout her POC.   Time 2   Period Weeks   Status New   PT SHORT TERM GOAL #2   Title Pt will demo correct sequencing with SPC x25 ft and min cues from therapist, x3 trials.   Time 4   Period Weeks   Status New   PT SHORT TERM GOAL #3   Title Pt will demo improved safety awareness evident by her ability to ascend/descend 5 6" steps with reciprocal pattern and LRAD with supervision only.    Time 4    Period Weeks   Status New   PT SHORT TERM GOAL #4   Title Pt will demo improved knee strength to 5/5 MMT to improve safety and stability during ambulation and stair negotiation.    Time 4   Period Weeks   Status New   PT SHORT TERM GOAL #5   Title Pt will transfer supine to/from sit each direction with correct log roll technique and with supervision only, x5 trials to improve her independence at home.    Time 4   Period Weeks   Status New           PT Long Term Goals - 11/18/15 1649    PT LONG TERM GOAL #1   Title Pt will demo improved LE strength to atleast 4/5 to improve her functional mobility.   Time 8   Period Weeks   Status New   PT LONG TERM GOAL #2   Title Pt will demo improved functional strength and power evident by a decrease in her 5x sit to stand time to under 12 sec.   Time 8   Period Weeks   Status New   PT LONG TERM GOAL #3   Title Pt will demo improved TUG time to less than 14 sec to decrease her ris kof falls at home and in the community.    Time 8   Period Weeks   Status New   PT LONG TERM GOAL #4   Title --   Time --   Period --   Status --               Plan - 12/03/15 1605    Clinical Impression Statement Today's session focused on therex to address LE strength and improve foot clearance during stair negotiation and ambulation. Pt demonstrating increased difficulty with single leg strengthening activity such as marching and step ups when using her RLE. Therapist discussed the importance of vision when it comes to balance and risk of falls and encouraged pt to pick up glasses. Will continue with current POC.   Rehab Potential Good   Clinical Impairments Affecting Rehab Potential acuity of stroke   PT Frequency 3x / week   PT Duration 8 weeks   PT Treatment/Interventions ADLs/Self Care Home Management;Cryotherapy;Patient/family education;Neuromuscular re-education;Balance training;Therapeutic exercise;Therapeutic activities;Functional  mobility training;Stair training;Gait training;Manual techniques   PT Next Visit Plan dynamic and static balance; acitivity to improve hip flexion/foot clearance during ambulation   PT  Home Exercise Plan no updates this session   Recommended Other Services none at this time   Consulted and Agree with Plan of Care Patient      Patient will benefit from skilled therapeutic intervention in order to improve the following deficits and impairments:  Abnormal gait, Decreased activity tolerance, Decreased balance, Decreased endurance, Decreased coordination, Decreased safety awareness, Decreased mobility, Decreased knowledge of use of DME, Decreased strength, Difficulty walking, Dizziness, Impaired flexibility, Improper body mechanics, Postural dysfunction, Pain  Visit Diagnosis: Other symptoms and signs involving the musculoskeletal system  Muscle weakness (generalized)  Other lack of coordination  Unsteadiness on feet  Other abnormalities of gait and mobility  Pain in left knee  History of falling     Problem List Patient Active Problem List   Diagnosis Date Noted  . Cerebral infarction due to unspecified mechanism   . Knee pain, acute   . Cephalalgia   . GERD (gastroesophageal reflux disease) 06/14/2011  . FH: colon cancer 04/05/2011  . History of colon polyps 04/05/2011  . Left sided abdominal pain 04/05/2011  . N&V (nausea and vomiting) 04/05/2011    4:12 PM,12/03/2015 Marylyn Ishihara PT, DPT [facility] [phone]  [facility] [address] [place], [place], [address] Phone: [phone]   Fax:  [phone]  Name: KIMONI PICKERILL MRN: [id number] Date of Birth: [birth year]

## 2015-12-06 ENCOUNTER — Encounter (HOSPITAL_COMMUNITY): Payer: Self-pay

## 2015-12-06 ENCOUNTER — Ambulatory Visit (HOSPITAL_COMMUNITY): Payer: Medicare HMO

## 2015-12-06 DIAGNOSIS — R29898 Other symptoms and signs involving the musculoskeletal system: Secondary | ICD-10-CM

## 2015-12-06 DIAGNOSIS — M6281 Muscle weakness (generalized): Secondary | ICD-10-CM | POA: Diagnosis not present

## 2015-12-06 DIAGNOSIS — R278 Other lack of coordination: Secondary | ICD-10-CM

## 2015-12-06 NOTE — Patient Instructions (Signed)
Complete 3-4 times a day with the left hand.   FINGER OPPOSITION COMBO  Start with an open palm and fingers extened.   Next, touch the tips of the first and second fingers. Then return to open palm.   Next, touch the tips of the first and third fingers, etc until all fingers have performed as shown.  Complete 5 times total.   COIN FLIP  Place various coins on a table and flip each coin with your affected hand.     CARD FLIP  Hold a deck of cards with your unaffected hand and flip one card at a time with your affected hand.     PAPER CRUMPLE  Take a piece of paper and crumple it.

## 2015-12-06 NOTE — Therapy (Signed)
[facility] [address] [place], [place], [address] Phone: [phone]   Fax:  [phone]  Occupational Therapy Treatment And reassessment  Patient Details  Name: Megan [name] MRN: [id number] Date of Birth: [birth year] Referring Provider: Antony Contras  Encounter Date: 12/06/2015      OT End of Session - 12/06/15 1221    Visit Number 2   Number of Visits 8   Authorization Type [employer] Medicare/[employer] Gold Plus   Authorization Time Period Before 10th visit   Authorization - Visit Number 2   Authorization - Number of Visits 10   OT Start Time 1115   OT Stop Time 1200   OT Time Calculation (min) 45 min   Activity Tolerance Patient tolerated treatment well   Behavior During Therapy [facility] for tasks assessed/performed      Past Medical History  Diagnosis Date  . HTN (hypertension)   . DM (diabetes mellitus) ([place])   . Anxiety   . Depression   . Obesity   . Colon polyps   . Complication of anesthesia   . PONV (postoperative nausea and vomiting)   . Agoraphobia with panic disorder   . Chronic pain 03/08/15    Hydrocodone-APAP 10/325 mg, # 120 q month  . Stroke [facility])     Past Surgical History  Procedure Laterality Date  . Cardiac catheterization  2004    normal  . S/p hysterectomy      complete  . Abdominal hysterectomy  15 yrs ago    with bso at aph  . Cholecystectomy  25 yrs ago    [facility]  . Colonoscopy  05/04/11    left-sided colonic diverticulosis  . Esophagogastroduodenoscopy  05/04/2011    Abnormal distal esophagus, biopsies consistent with inflammation due to acid reflux. No evidence of Barrett's. No H. pylori  . Radiology with anesthesia N/A 08/20/2015    Procedure: RADIOLOGY WITH ANESTHESIA;  Surgeon: Luanne Bras, MD;  Location: [facility];  Service: Radiology;  Laterality: N/A;    There were no vitals filed for this visit.      Subjective Assessment - 12/06/15 1219    Subjective  S: I can do everything I  need to do. I don't know why I'm here.    Currently in Pain? No/denies            [facility] OT Assessment - 12/06/15 1126    Assessment   Diagnosis Left spastic hemiplegia   Precautions   Precautions None   Coordination   Left 9 Hole Peg Test 36.9"  previous: 42.05"   Strength   Overall Strength Comments Assessed seated, shoulder er/IR adducted   Strength Assessment Site Shoulder   Right/Left Shoulder Left   Left Shoulder Flexion 4/5  previous: 4-/5   Left Shoulder ABduction 4/5  previous: 4-/5   Left Shoulder Internal Rotation 4/5  previous: same   Left Shoulder External Rotation 4/5  previous: 4-/5   Right/Left Elbow Left   Left Elbow Flexion 4/5  previous: same   Left Elbow Extension 4/5  previous: same   Right/Left Forearm Left   Left Forearm Pronation 4/5  previous: same   Left Forearm Supination 4/5  previous: same   Right/Left Wrist Left   Left Wrist Flexion 4+/5  previous: same   Left Wrist Extension 4+/5  previous: same   Left Wrist Radial Deviation 4/5  previous: same   Left Wrist Ulnar Deviation 4/5  previous: same   Right/Left hand Left   Left  Hand Grip (lbs) 23  previous: 18   Left Hand Lateral Pinch 6 lbs  previous: same   Left Hand 3 Point Pinch 6 lbs  previous: 6                  OT Treatments/Exercises (OP) - 12/06/15 0001    Exercises   Exercises Hand   Hand Exercises   Thumb Opposition 5 times total   Sponges 21   Fine Motor Coordination   Fine Motor Coordination Flipping cards   Flipping cards Pt complete card flipping one at a time with min difficulty.    Other Fine Motor Exercises Coin flipping and stacking with left hand. Increased time.                OT Education - 12/06/15 1219    Education provided Yes   Education Details Fine motor coordination activities given as an updated HEP. Pt was given print out of OT evaluation and reviewed therapy goals and POC.    Person(s) Educated Patient   Methods  Explanation;Demonstration;Verbal cues;Handout   Comprehension Returned demonstration;Verbalized understanding          OT Short Term Goals - 12/06/15 1227    OT SHORT TERM GOAL #1   Title Pt will be provided with and educated on HEP.    Time 4   Period Weeks   Status Achieved   OT SHORT TERM GOAL #2   Title Pt will increase LUE strength to 4/5 to increase ability to carry pots and pans using LUE as assist.    Time 4   Period Weeks   Status Achieved   OT SHORT TERM GOAL #3   Title Pt will increase LUE grip strength by 5# to increase ability to hold a gallon of milk.    Time 4   Period Weeks   Status Achieved   OT SHORT TERM GOAL #4   Title Pt will increase LUE pinch strength by 2# to increase ability to complete cooking tasks using various utensils.    Time 4   Period Weeks   Status Not Met           OT Long Term Goals - 12/06/15 1227    OT LONG TERM GOAL #1   Title Pt will return to highest level of functioning and independence in daily tasks.    Time 8   Period Weeks   Status Achieved   OT LONG TERM GOAL #2   Title Pt will increase LUE strength to 4+/5 to increase ability to complete household tasks with minimal rest breaks.    Time 8   Status Not Met   OT LONG TERM GOAL #3   Title Pt will increase LUE grip strength by 10# to increase ability to complete gardening activities.    Time 8   Period Weeks   Status Not Met   OT LONG TERM GOAL #4   Title Pt will increase LUE pinch strength to increase ability to weed gardens and harvest produce.    Time 8   Period Weeks   Status New   OT LONG TERM GOAL #5   Title Pt will increase LUE fine motor coordination to increase ability to complete daily tasks, such as tying shoes, in a timely manner.    Time 8   Period Weeks   Status Achieved               Plan - 12/06/15 1221    Clinical Impression  Statement A: Pt arrives today questioning the reason for therapy. After therapist reviewed therapy goals patient  reports that she is completing all therapy goals and she isn't having any difficulty with anything at home. Pt reports that when she started therapy she was having difficulty holding onto items although now she has no difficulty.    Plan P: D/C from therapy with HEP.      Patient will benefit from skilled therapeutic intervention in order to improve the following deficits and impairments:  Decreased strength, Impaired UE functional use, Decreased range of motion, Increased fascial restricitons, Decreased coordination, Decreased activity tolerance  Visit Diagnosis: Other symptoms and signs involving the musculoskeletal system  Other lack of coordination      G-Codes - 12/27/15 1226/09/30    Functional Assessment Tool Used clinical judgement   Functional Limitation Carrying, moving and handling objects   Carrying, Moving and Handling Objects Goal Status (M3014) At least 1 percent but less than 20 percent impaired, limited or restricted   Carrying, Moving and Handling Objects Discharge Status [phone]) At least 1 percent but less than 20 percent impaired, limited or restricted      Problem List Patient Active Problem List   Diagnosis Date Noted  . Cerebral infarction due to unspecified mechanism   . Knee pain, acute   . Cephalalgia   . GERD (gastroesophageal reflux disease) 06/14/2011  . FH: colon cancer 04/05/2011  . History of colon polyps 04/05/2011  . Left sided abdominal pain 04/05/2011  . N&V (nausea and vomiting) 04/05/2011  OCCUPATIONAL THERAPY DISCHARGE SUMMARY  Visits from Start of Care: 2  Current functional level related to goals / functional outcomes: See above    Remaining deficits: Pt presents with decreased pinch strength and coordination although reports that she has no remaining deficits.    Education / Equipment: See above Plan: Patient agrees to discharge.  Patient goals were partially met. Patient is being discharged due to being pleased with the current  functional level.  ?????       Ailene Ravel, OTR/L,CBIS  [phone]  December 27, 2015, 12:36 PM  [facility] [address] [place], [place], [address] Phone: [phone]   Fax:  [phone]  Name: Megan [name] MRN: [id number] Date of Birth: July 22, 1950

## 2015-12-07 ENCOUNTER — Ambulatory Visit (HOSPITAL_COMMUNITY): Payer: Medicare HMO | Admitting: Physical Therapy

## 2015-12-07 ENCOUNTER — Encounter (HOSPITAL_COMMUNITY): Payer: Self-pay | Admitting: Physical Therapy

## 2015-12-07 DIAGNOSIS — M6281 Muscle weakness (generalized): Secondary | ICD-10-CM

## 2015-12-07 DIAGNOSIS — R29898 Other symptoms and signs involving the musculoskeletal system: Secondary | ICD-10-CM

## 2015-12-07 DIAGNOSIS — R2681 Unsteadiness on feet: Secondary | ICD-10-CM

## 2015-12-07 DIAGNOSIS — Z9181 History of falling: Secondary | ICD-10-CM

## 2015-12-07 DIAGNOSIS — M25562 Pain in left knee: Secondary | ICD-10-CM

## 2015-12-07 DIAGNOSIS — R278 Other lack of coordination: Secondary | ICD-10-CM

## 2015-12-07 DIAGNOSIS — R2689 Other abnormalities of gait and mobility: Secondary | ICD-10-CM

## 2015-12-07 NOTE — Therapy (Signed)
[facility] [address] [place], [place], [address] Phone: [phone]   Fax:  [phone]  Physical Therapy Treatment  Patient Details  Name: COLLEENA KURTENBACH MRN: [id number] Date of Birth: 1950-11-25 Referring Provider: Delia Heady  Encounter Date: 12/07/2015      PT End of Session - 12/07/15 1535    Visit Number 5   Number of Visits 24   Date for PT Re-Evaluation 12/16/15   Authorization Type [employer] Medicare   Authorization Time Period 11/18/15 to 01/13/16   Authorization - Visit Number 5   Authorization - Number of Visits 24   PT Start Time 1430   PT Stop Time 1513   PT Time Calculation (min) 43 min   Equipment Utilized During Treatment Gait belt   Activity Tolerance Patient tolerated treatment well   Behavior During Therapy [facility] for tasks assessed/performed      Past Medical History  Diagnosis Date  . HTN (hypertension)   . DM (diabetes mellitus) ([facility])   . Anxiety   . Depression   . Obesity   . Colon polyps   . Complication of anesthesia   . PONV (postoperative nausea and vomiting)   . Agoraphobia with panic disorder   . Chronic pain 03/08/15    Hydrocodone-APAP 10/325 mg, # 120 q month  . Stroke [facility])     Past Surgical History  Procedure Laterality Date  . Cardiac catheterization  2004    normal  . S/p hysterectomy      complete  . Abdominal hysterectomy  15 yrs ago    with bso at aph  . Cholecystectomy  25 yrs ago    [facility]  . Colonoscopy  05/04/11    left-sided colonic diverticulosis  . Esophagogastroduodenoscopy  05/04/2011    Abnormal distal esophagus, biopsies consistent with inflammation due to acid reflux. No evidence of Barrett's. No H. pylori  . Radiology with anesthesia N/A 08/20/2015    Procedure: RADIOLOGY WITH ANESTHESIA;  Surgeon: Julieanne Cotton, MD;  Location: [facility] OR;  Service: Radiology;  Laterality: N/A;    There were no vitals filed for this visit.      Subjective Assessment -  12/07/15 1438    Subjective Pt states she is a little sore today in her quads. Reports doing her exercises at home. No other complaints.    Pertinent History HTN, DM, obesity, stroke 08/21/15   Patient Stated Goals improve strength get back to PLOF   Currently in Pain? No/denies                         [facility] Adult PT Treatment/Exercise - 12/07/15 0001    Knee/Hip Exercises: Stretches   Quad Stretch Left;3 reps;30 seconds   Quad Stretch Limitations supine with towel   Knee/Hip Exercises: Supine   Bridges Both;2 sets;15 reps   Bridges Limitations green Tb around knees   Knee/Hip Exercises: Sidelying   Clams 2x10 each, green TB             Balance Exercises - 12/07/15 1455    Balance Exercises: Standing   Tandem Stance Eyes open;20 secs;Eyes closed;4 reps  external perturbations    SLS Eyes open;Upper extremity support 1;3 reps;10 secs  L: max 9 sec, R: max 5 sec   Sidestepping 2 reps  over small/large hurdles, CGA to occasional MinA for LOB rec   Step Over Hurdles / Cones forward stepping over hurdles (small/large) with CGA to minA for balance  recovery           PT Education - 12/07/15 1445    Education provided Yes   Education Details discussed POC/reassessment coming up; HEP adherence; correct posture during balance training   Person(s) Educated Patient   Methods Explanation;Demonstration   Comprehension Verbalized understanding;Returned demonstration          PT Short Term Goals - 11/18/15 1645    PT SHORT TERM GOAL #1   Title Pt will demo understanding and indep with her HEP, to be updated throughout her POC.   Time 2   Period Weeks   Status New   PT SHORT TERM GOAL #2   Title Pt will demo correct sequencing with SPC x25 ft and min cues from therapist, x3 trials.   Time 4   Period Weeks   Status New   PT SHORT TERM GOAL #3   Title Pt will demo improved safety awareness evident by her ability to ascend/descend 5 6" steps with reciprocal  pattern and LRAD with supervision only.    Time 4   Period Weeks   Status New   PT SHORT TERM GOAL #4   Title Pt will demo improved knee strength to 5/5 MMT to improve safety and stability during ambulation and stair negotiation.    Time 4   Period Weeks   Status New   PT SHORT TERM GOAL #5   Title Pt will transfer supine to/from sit each direction with correct log roll technique and with supervision only, x5 trials to improve her independence at home.    Time 4   Period Weeks   Status New           PT Long Term Goals - 11/18/15 1649    PT LONG TERM GOAL #1   Title Pt will demo improved LE strength to atleast 4/5 to improve her functional mobility.   Time 8   Period Weeks   Status New   PT LONG TERM GOAL #2   Title Pt will demo improved functional strength and power evident by a decrease in her 5x sit to stand time to under 12 sec.   Time 8   Period Weeks   Status New   PT LONG TERM GOAL #3   Title Pt will demo improved TUG time to less than 14 sec to decrease her ris kof falls at home and in the community.    Time 8   Period Weeks   Status New   PT LONG TERM GOAL #4   Title --   Time --   Period --   Status --               Plan - 12/07/15 1536    Clinical Impression Statement Pt is demonstrating improved HEP adherence lately, with additional LE soreness from muscle fatigue. Today's session focused on LE therex and balance activity to improve strength and stability with daily activity. Pt demonstrating improved SLS this session up to 5 sec on each LE and improved tolerance to activity requiring fewer rest breaks. Will continue with current POC.    Rehab Potential Good   Clinical Impairments Affecting Rehab Potential acuity of stroke   PT Frequency 3x / week   PT Duration 8 weeks   PT Treatment/Interventions ADLs/Self Care Home Management;Cryotherapy;Patient/family education;Neuromuscular re-education;Balance training;Therapeutic exercise;Therapeutic  activities;Functional mobility training;Stair training;Gait training;Manual techniques   PT Next Visit Plan dynamic and static balance; acitivity to improve hip flexion/foot clearance during ambulation   PT  Home Exercise Plan no updates this session   Consulted and Agree with Plan of Care Patient      Patient will benefit from skilled therapeutic intervention in order to improve the following deficits and impairments:  Abnormal gait, Decreased activity tolerance, Decreased balance, Decreased endurance, Decreased coordination, Decreased safety awareness, Decreased mobility, Decreased knowledge of use of DME, Decreased strength, Difficulty walking, Dizziness, Impaired flexibility, Improper body mechanics, Postural dysfunction, Pain  Visit Diagnosis: Other symptoms and signs involving the musculoskeletal system  Other lack of coordination  Muscle weakness (generalized)  Unsteadiness on feet  Other abnormalities of gait and mobility  Pain in left knee  History of falling     Problem List Patient Active Problem List   Diagnosis Date Noted  . Cerebral infarction due to unspecified mechanism   . Knee pain, acute   . Cephalalgia   . GERD (gastroesophageal reflux disease) 06/14/2011  . FH: colon cancer 04/05/2011  . History of colon polyps 04/05/2011  . Left sided abdominal pain 04/05/2011  . N&V (nausea and vomiting) 04/05/2011   4:30 PM,12/07/2015 Marylyn IshiharaSara Kiser PT, DPT [facility] [phone]  [facility] [address] [place], [place], [address] Phone: [phone]   Fax:  [phone]  Name: Casimer LeekVirginia A Nicolson MRN: [id number] Date of Birth: 03/18/1951

## 2015-12-09 ENCOUNTER — Telehealth (HOSPITAL_COMMUNITY): Payer: Self-pay

## 2015-12-09 ENCOUNTER — Ambulatory Visit (HOSPITAL_COMMUNITY): Payer: Medicare HMO | Admitting: Physical Therapy

## 2015-12-09 NOTE — Telephone Encounter (Signed)
12/09/15 she called to say she had another drs appt at the same time as her appt here today so she would have to skip it

## 2015-12-15 ENCOUNTER — Encounter (HOSPITAL_COMMUNITY): Payer: Commercial Managed Care - HMO | Admitting: Occupational Therapy

## 2015-12-16 ENCOUNTER — Ambulatory Visit (HOSPITAL_COMMUNITY): Payer: Medicare HMO | Attending: Neurology | Admitting: Physical Therapy

## 2015-12-16 DIAGNOSIS — M6281 Muscle weakness (generalized): Secondary | ICD-10-CM | POA: Diagnosis present

## 2015-12-16 DIAGNOSIS — R2681 Unsteadiness on feet: Secondary | ICD-10-CM | POA: Diagnosis present

## 2015-12-16 DIAGNOSIS — R2689 Other abnormalities of gait and mobility: Secondary | ICD-10-CM | POA: Diagnosis present

## 2015-12-16 DIAGNOSIS — R29898 Other symptoms and signs involving the musculoskeletal system: Secondary | ICD-10-CM | POA: Diagnosis present

## 2015-12-16 DIAGNOSIS — R278 Other lack of coordination: Secondary | ICD-10-CM | POA: Diagnosis present

## 2015-12-16 DIAGNOSIS — M25562 Pain in left knee: Secondary | ICD-10-CM | POA: Diagnosis present

## 2015-12-16 DIAGNOSIS — Z9181 History of falling: Secondary | ICD-10-CM | POA: Diagnosis present

## 2015-12-16 NOTE — Therapy (Signed)
[facility] [address] [place], [place], [address] Phone: [phone]   Fax:  [phone]  Physical Therapy Treatment  Patient Details  Name: Megan [name] MRN: [id number] Date of Birth: December 23, 1950 Referring Provider: Delia Heady  Encounter Date: 12/16/2015      PT End of Session - 12/16/15 1742    Visit Number 6   Number of Visits 24   Date for PT Re-Evaluation 12/16/15   Authorization Type [employer] Medicare   Authorization Time Period 11/18/15 to 01/13/16   Authorization - Visit Number 6   Authorization - Number of Visits 24   PT Start Time 1645   PT Stop Time 1730   PT Time Calculation (min) 45 min   Equipment Utilized During Treatment Gait belt   Activity Tolerance Patient tolerated treatment well   Behavior During Therapy [facility] for tasks assessed/performed      Past Medical History  Diagnosis Date  . HTN (hypertension)   . DM (diabetes mellitus) ([facility])   . Anxiety   . Depression   . Obesity   . Colon polyps   . Complication of anesthesia   . PONV (postoperative nausea and vomiting)   . Agoraphobia with panic disorder   . Chronic pain 03/08/15    Hydrocodone-APAP 10/325 mg, # 120 q month  . Stroke [facility])     Past Surgical History  Procedure Laterality Date  . Cardiac catheterization  2004    normal  . S/p hysterectomy      complete  . Abdominal hysterectomy  15 yrs ago    with bso at aph  . Cholecystectomy  25 yrs ago    [facility]  . Colonoscopy  05/04/11    left-sided colonic diverticulosis  . Esophagogastroduodenoscopy  05/04/2011    Abnormal distal esophagus, biopsies consistent with inflammation due to acid reflux. No evidence of Barrett's. No H. pylori  . Radiology with anesthesia N/A 08/20/2015    Procedure: RADIOLOGY WITH ANESTHESIA;  Surgeon: Julieanne Cotton, MD;  Location: [facility] OR;  Service: Radiology;  Laterality: N/A;    There were no vitals filed for this visit.      Subjective Assessment -  12/16/15 1658    Subjective PT states her Lt knee hurts today and her feet are swollen.  Pt comes today wearing socks only without gripper bottoms.   Currently in Pain? Yes   Pain Score 4    Pain Location Knee   Pain Orientation Left   Pain Descriptors / Indicators Sharp   Aggravating Factors  weight bearing and lifting LE                         [facility] Adult PT Treatment/Exercise - 12/16/15 0001    Knee/Hip Exercises: Standing   Heel Raises Both;1 set;20 reps   Heel Raises Limitations toe raises   Knee/Hip Exercises: Prone   Straight Leg Raises Limitations quadruped 5 reps UE 5 reps LE             Balance Exercises - 12/16/15 1707    Balance Exercises: Standing   Tandem Stance Eyes open;Foam/compliant surface;2 reps;30 secs;Intermittent upper extremity support   SLS Eyes open;Upper extremity support 1;3 reps;10 secs   Rockerboard Anterior/posterior;Lateral;EO;UE support   Tandem Gait Forward;Foam/compliant surface;2 reps   Sidestepping 2 reps  on foam             PT Short Term Goals - 11/18/15 1645    PT SHORT  TERM GOAL #1   Title Pt will demo understanding and indep with her HEP, to be updated throughout her POC.   Time 2   Period Weeks   Status New   PT SHORT TERM GOAL #2   Title Pt will demo correct sequencing with SPC x25 ft and min cues from therapist, x3 trials.   Time 4   Period Weeks   Status New   PT SHORT TERM GOAL #3   Title Pt will demo improved safety awareness evident by her ability to ascend/descend 5 6" steps with reciprocal pattern and LRAD with supervision only.    Time 4   Period Weeks   Status New   PT SHORT TERM GOAL #4   Title Pt will demo improved knee strength to 5/5 MMT to improve safety and stability during ambulation and stair negotiation.    Time 4   Period Weeks   Status New   PT SHORT TERM GOAL #5   Title Pt will transfer supine to/from sit each direction with correct log roll technique and with supervision  only, x5 trials to improve her independence at home.    Time 4   Period Weeks   Status New           PT Long Term Goals - 11/18/15 1649    PT LONG TERM GOAL #1   Title Pt will demo improved LE strength to atleast 4/5 to improve her functional mobility.   Time 8   Period Weeks   Status New   PT LONG TERM GOAL #2   Title Pt will demo improved functional strength and power evident by a decrease in her 5x sit to stand time to under 12 sec.   Time 8   Period Weeks   Status New   PT LONG TERM GOAL #3   Title Pt will demo improved TUG time to less than 14 sec to decrease her ris kof falls at home and in the community.    Time 8   Period Weeks   Status New   PT LONG TERM GOAL #4   Title --   Time --   Period --   Status --               Plan - 12/16/15 1743    Clinical Impression Statement Pt came to therapy in socks; given gripper socks to complete therapy in.  Encouraged patient to wear her compression stockings that she has or purchase new ones (given [place] compression outlet infor).  Focused session on improving dynamic balance and static balance without UE dependence.  Pt very fearful during actvities requiring VC's to complete without UE assistance.  Able to convince to complete SLS with 1 finger hold and added quadruped actvity as well.   Rehab Potential Good   Clinical Impairments Affecting Rehab Potential acuity of stroke   PT Frequency 3x / week   PT Duration 8 weeks   PT Treatment/Interventions ADLs/Self Care Home Management;Cryotherapy;Patient/family education;Neuromuscular re-education;Balance training;Therapeutic exercise;Therapeutic activities;Functional mobility training;Stair training;Gait training;Manual techniques   PT Next Visit Plan dynamic and static balance; acitivity to improve hip flexion/foot clearance during ambulation   PT Home Exercise Plan no updates this session   Consulted and Agree with Plan of Care Patient      Patient will benefit  from skilled therapeutic intervention in order to improve the following deficits and impairments:  Abnormal gait, Decreased activity tolerance, Decreased balance, Decreased endurance, Decreased coordination, Decreased safety awareness, Decreased mobility, Decreased  knowledge of use of DME, Decreased strength, Difficulty walking, Dizziness, Impaired flexibility, Improper body mechanics, Postural dysfunction, Pain  Visit Diagnosis: Other symptoms and signs involving the musculoskeletal system  Other lack of coordination  Muscle weakness (generalized)  Unsteadiness on feet  Other abnormalities of gait and mobility  Pain in left knee  History of falling     Problem List Patient Active Problem List   Diagnosis Date Noted  . Cerebral infarction due to unspecified mechanism   . Knee pain, acute   . Cephalalgia   . GERD (gastroesophageal reflux disease) 06/14/2011  . FH: colon cancer 04/05/2011  . History of colon polyps 04/05/2011  . Left sided abdominal pain 04/05/2011  . N&V (nausea and vomiting) 04/05/2011    Lurena Nidamy B Lyonel Morejon, PTA/CLT [phone]  12/16/2015, 5:52 PM  [facility] [address] [place], [place], [address] Phone: [phone]   Fax:  [phone]  Name: Megan [name] MRN: [id number] Date of Birth: 01/25/1951

## 2015-12-17 ENCOUNTER — Telehealth (HOSPITAL_COMMUNITY): Payer: Self-pay | Admitting: Physical Therapy

## 2015-12-17 ENCOUNTER — Ambulatory Visit (HOSPITAL_COMMUNITY): Payer: Medicare HMO | Admitting: Physical Therapy

## 2015-12-17 NOTE — Telephone Encounter (Signed)
reminded pt of apt this afternoon at 4pm. She forgot and states she didn't see today's session on the paper. Therapist reminded her of her next apt on Tues 12/21/15 and she confirmed she will be there.   4:17 PM,12/17/2015 Marylyn IshiharaSara Kiser PT, DPT [facility] [phone]

## 2015-12-20 ENCOUNTER — Encounter (HOSPITAL_COMMUNITY): Payer: Commercial Managed Care - HMO

## 2015-12-21 ENCOUNTER — Ambulatory Visit (HOSPITAL_COMMUNITY): Payer: Medicare HMO | Admitting: Physical Therapy

## 2015-12-21 DIAGNOSIS — R2681 Unsteadiness on feet: Secondary | ICD-10-CM

## 2015-12-21 DIAGNOSIS — R29898 Other symptoms and signs involving the musculoskeletal system: Secondary | ICD-10-CM | POA: Diagnosis not present

## 2015-12-21 DIAGNOSIS — R2689 Other abnormalities of gait and mobility: Secondary | ICD-10-CM

## 2015-12-21 DIAGNOSIS — M25562 Pain in left knee: Secondary | ICD-10-CM

## 2015-12-21 DIAGNOSIS — Z9181 History of falling: Secondary | ICD-10-CM

## 2015-12-21 DIAGNOSIS — R278 Other lack of coordination: Secondary | ICD-10-CM

## 2015-12-21 DIAGNOSIS — M6281 Muscle weakness (generalized): Secondary | ICD-10-CM

## 2015-12-21 NOTE — Therapy (Signed)
[facility] [address] [place], [place], [address] Phone: [phone]   Fax:  [phone]  Physical Therapy Treatment/Reassessment  Patient Details  Name: Megan [name] MRN: [id number] Date of Birth: February 05, 1951 Referring Provider: Antony Contras  Encounter Date: 12/21/2015      PT End of Session - 12/21/15 1437    Visit Number 7   Number of Visits 24   Date for PT Re-Evaluation 01/13/16   Authorization Type [employer]   Authorization Time Period 11/18/15 to 01/13/16   Authorization - Visit Number 7   Authorization - Number of Visits 24   PT Start Time 1350   PT Stop Time 1430   PT Time Calculation (min) 40 min   Equipment Utilized During Treatment Gait belt   Activity Tolerance Patient tolerated treatment well   Behavior During Therapy [facility] for tasks assessed/performed      Past Medical History  Diagnosis Date  . HTN (hypertension)   . DM (diabetes mellitus) ([place])   . Anxiety   . Depression   . Obesity   . Colon polyps   . Complication of anesthesia   . PONV (postoperative nausea and vomiting)   . Agoraphobia with panic disorder   . Chronic pain 03/08/15    Hydrocodone-APAP 10/325 mg, # 120 q month  . Stroke [facility])     Past Surgical History  Procedure Laterality Date  . Cardiac catheterization  2004    normal  . S/p hysterectomy      complete  . Abdominal hysterectomy  15 yrs ago    with bso at aph  . Cholecystectomy  25 yrs ago    [facility]  . Colonoscopy  05/04/11    left-sided colonic diverticulosis  . Esophagogastroduodenoscopy  05/04/2011    Abnormal distal esophagus, biopsies consistent with inflammation due to acid reflux. No evidence of Barrett's. No H. pylori  . Radiology with anesthesia N/A 08/20/2015    Procedure: RADIOLOGY WITH ANESTHESIA;  Surgeon: Luanne Bras, MD;  Location: [facility];  Service: Radiology;  Laterality: N/A;    There were no vitals filed for this visit.      Subjective  Assessment - 12/21/15 1354    Subjective Pt feels she has gotten a "whole lot" better. She is able to do all of her house work and ADLs without issues. Her only complaint is that her walking is off balance still. She notes she has always had problems walking straight, even as a kid.    Pertinent History HTN, DM, obesity, stroke 08/21/15   Limitations --  nothing    How long can you sit comfortably? unlimited   How long can you stand comfortably? 25 minutes due to her back   How long can you walk comfortably? no issues   Patient Stated Goals improve strength get back to PLOF   Currently in Pain? No/denies            [facility] PT Assessment - 12/21/15 0001    Assessment   Medical Diagnosis Spastic hemiplegia non dominant side   Onset Date/Surgical Date 08/21/15   Hand Dominance Right   Next MD Visit unsure   Prior Therapy hospital   Precautions   Precautions None   Restrictions   Weight Bearing Restrictions No   Balance Screen   Has the patient fallen in the past 6 months No   Has the patient had a decrease in activity level because of a fear of falling?  No  Is the patient reluctant to leave their home because of a fear of falling?  No   Home Environment   Living Environment Private residence   Living Arrangements Spouse/significant other   Type of [place] to enter  7 STE with handrails   Prior Function   Level of Independence Independent   Leisure gardening    Observation/Other Assessments   Observations Pt arrived wearing L neoprene sleeve on her L knee   Focus on Therapeutic Outcomes (FOTO)  81% limited   Sensation   Light Touch Appears Intact   Posture/Postural Control   Posture/Postural Control Postural limitations   Postural Limitations Rounded Shoulders;Forward head   Strength   Right Hip Flexion 4+/5   Right Hip Extension 2/5   Right Hip ABduction 4+/5   Left Hip Flexion 4/5   Left Hip Extension 2/5   Left Hip ABduction 4-/5   Right  Knee Flexion 5/5   Right Knee Extension 5/5   Left Knee Flexion 4+/5   Left Knee Extension 5/5   Right Ankle Dorsiflexion 5/5   Left Ankle Dorsiflexion 5/5   Palpation   Palpation comment TTP along ITB/quad   Bed Mobility   Bed Mobility Rolling Right;Rolling Left;Supine to Sit   Rolling Right 6: Modified independent (Device/Increase time)   Rolling Left 6: Modified independent (Device/Increase time)   Supine to Sit 6: Modified independent (Device/Increase time)   Ambulation/Gait   Ambulation/Gait Yes   Ambulation/Gait Assistance 4: Min guard   Ambulation Distance (Feet) 50 Feet   Assistive device None;Straight cane   Gait Pattern Decreased stance time - left;Decreased step length - right;Antalgic  noting occasional Lt knee buckling   Gait Comments no longer using [facility]   Standardized Balance Assessment   Standardized Balance Assessment Timed Up and Go Test;Five Times Sit to Stand   Five times sit to stand comments  15.5 sec without UE   Timed Up and Go Test   TUG Comments 18.7 sec without AD                          Balance Exercises - 12/21/15 1417    Balance Exercises: Standing   Standing Eyes Opened Narrow base of support (BOS)  x30 sec.   Tandem Stance Eyes open;2 reps  x30 sec   SLS Eyes open  unable to lift each LE off the floor.            PT Education - 12/21/15 1426    Education provided Yes   Education Details discussed goals/progress and POC moving forward; importance of HEP adherence to improve strength; importance of improved balance for decreased risk of falls.   Person(s) Educated Patient   Methods Explanation;Demonstration;Handout   Comprehension Verbalized understanding;Returned demonstration;Need further instruction          PT Short Term Goals - 12/21/15 1421    PT SHORT TERM GOAL #1   Title Pt will demo understanding and indep with her HEP, to be updated throughout her POC.   Time 2   Period Weeks   Status Achieved   PT  SHORT TERM GOAL #2   Title Pt will demo correct sequencing with SPC x25 ft and min cues from therapist, x3 trials.   Baseline Not using SPC   Time 4   Period Weeks   Status Unable to assess   PT SHORT TERM GOAL #3   Title Pt will demo improved safety  awareness evident by her ability to ascend/descend 5 6" steps with reciprocal pattern and LRAD with supervision only.    Time 4   Period Weeks   Status New   PT SHORT TERM GOAL #4   Title Pt will demo improved knee strength to 5/5 MMT to improve safety and stability during ambulation and stair negotiation.    Baseline hip extensor strength 2/5 MMT   Time 4   Period Weeks   Status Partially Met   PT SHORT TERM GOAL #5   Title Pt will transfer supine to/from sit each direction with correct log roll technique and with supervision only, x5 trials to improve her independence at home.    Baseline demo with cues from therapist   Time 4   Period Weeks   Status Partially Met           PT Long Term Goals - 12/21/15 1424    PT LONG TERM GOAL #1   Title Pt will demo improved LE strength to atleast 4/5 to improve her functional mobility.   Baseline except hip extension 2/5 MMT   Time 8   Period Weeks   Status Partially Met   PT LONG TERM GOAL #2   Title Pt will demo improved functional strength and power evident by a decrease in her 5x sit to stand time to under 12 sec.   Time 8   Period Weeks   Status Partially Met   PT LONG TERM GOAL #3   Title Pt will demo improved TUG time to less than 14 sec to decrease her ris kof falls at home and in the community.    Time 8   Period Weeks   Status Partially Met               Plan - 12/21/15 1438    Clinical Impression Statement Pt was reassessed this visit and was found to have met a few of her goals and is showing progress towards all others. Her B hip strength has improved, throughout except noted significant weakness of her hip extensors which is carrying over to balance and other  functional tasks. She demonstrates improved balance and mobility however she continues to require increased time to complete bed mobility and occasional LOB/fearfulness with balance activity. PT reviewed log roll technique with heavy tactile and verbal cues and encouraged pt to work on this at home for improved energy conservation. Pt would benefit from continued skilled PT services to address her impairments.    Rehab Potential Good   Clinical Impairments Affecting Rehab Potential acuity of stroke   PT Frequency 3x / week   PT Duration 8 weeks   PT Treatment/Interventions ADLs/Self Care Home Management;Cryotherapy;Patient/family education;Neuromuscular re-education;Balance training;Therapeutic exercise;Therapeutic activities;Functional mobility training;Stair training;Gait training;Manual techniques   PT Next Visit Plan log roll review, hip extensor strength   PT Home Exercise Plan updated with bridge   Consulted and Agree with Plan of Care Patient      Patient will benefit from skilled therapeutic intervention in order to improve the following deficits and impairments:  Abnormal gait, Decreased activity tolerance, Decreased balance, Decreased endurance, Decreased coordination, Decreased safety awareness, Decreased mobility, Decreased knowledge of use of DME, Decreased strength, Difficulty walking, Dizziness, Impaired flexibility, Improper body mechanics, Postural dysfunction, Pain  Visit Diagnosis: Other symptoms and signs involving the musculoskeletal system  Other lack of coordination  Muscle weakness (generalized)  Unsteadiness on feet  Other abnormalities of gait and mobility  Pain in left knee  History  of falling       G-Codes - 12/21/15 1625    Functional Assessment Tool Used clinical judgment based on assessment of strenght, mobility and balance   Functional Limitation Mobility: Walking and moving around   Mobility: Walking and Moving Around Current Status [phone]) At least  40 percent but less than 60 percent impaired, limited or restricted   Mobility: Walking and Moving Around Goal Status [phone]) At least 40 percent but less than 60 percent impaired, limited or restricted      Problem List Patient Active Problem List   Diagnosis Date Noted  . Cerebral infarction due to unspecified mechanism   . Knee pain, acute   . Cephalalgia   . GERD (gastroesophageal reflux disease) 06/14/2011  . FH: colon cancer 04/05/2011  . History of colon polyps 04/05/2011  . Left sided abdominal pain 04/05/2011  . N&V (nausea and vomiting) 04/05/2011    4:29 PM,12/21/2015 Elly Modena PT, DPT [facility] [place] [address] [place], [place], [address] Phone: [phone]   Fax:  [phone]  Name: Megan [name] MRN: [id number] Date of Birth: 14-May-1951

## 2015-12-22 ENCOUNTER — Encounter (HOSPITAL_COMMUNITY): Payer: Self-pay | Admitting: Emergency Medicine

## 2015-12-22 ENCOUNTER — Emergency Department (HOSPITAL_COMMUNITY): Payer: Medicare HMO

## 2015-12-22 ENCOUNTER — Emergency Department (HOSPITAL_COMMUNITY)
Admission: EM | Admit: 2015-12-22 | Discharge: 2015-12-23 | Disposition: A | Discharge status: Home / Self Care | Payer: Medicare HMO | Attending: Emergency Medicine | Admitting: Emergency Medicine

## 2015-12-22 DIAGNOSIS — Z79899 Other long term (current) drug therapy: Secondary | ICD-10-CM | POA: Diagnosis not present

## 2015-12-22 DIAGNOSIS — E119 Type 2 diabetes mellitus without complications: Secondary | ICD-10-CM | POA: Diagnosis not present

## 2015-12-22 DIAGNOSIS — F329 Major depressive disorder, single episode, unspecified: Secondary | ICD-10-CM | POA: Insufficient documentation

## 2015-12-22 DIAGNOSIS — R519 Headache, unspecified: Secondary | ICD-10-CM

## 2015-12-22 DIAGNOSIS — R51 Headache: Secondary | ICD-10-CM | POA: Diagnosis present

## 2015-12-22 DIAGNOSIS — Z87891 Personal history of nicotine dependence: Secondary | ICD-10-CM | POA: Insufficient documentation

## 2015-12-22 DIAGNOSIS — Z8673 Personal history of transient ischemic attack (TIA), and cerebral infarction without residual deficits: Secondary | ICD-10-CM | POA: Diagnosis not present

## 2015-12-22 DIAGNOSIS — I10 Essential (primary) hypertension: Secondary | ICD-10-CM | POA: Diagnosis not present

## 2015-12-22 MED ORDER — HYDROCODONE-ACETAMINOPHEN 7.5-325 MG/15ML PO SOLN
15.0000 mL | Freq: Once | ORAL | Status: AC
  Administered 2015-12-22: 15 mL via ORAL
  Filled 2015-12-22: qty 15

## 2015-12-22 MED ORDER — PROCHLORPERAZINE MALEATE 5 MG PO TABS
10.0000 mg | ORAL_TABLET | Freq: Four times a day (QID) | ORAL | Status: DC | PRN
  Administered 2015-12-22: 10 mg via ORAL
  Filled 2015-12-22: qty 2

## 2015-12-22 NOTE — ED Notes (Signed)
Pt states left sided HA that started last night with light sensitivity. Also reports left knee and buttock pain. Pt has residual weakness to left side from previous stroke.

## 2015-12-22 NOTE — ED Notes (Signed)
Pt c/o head pain, left knee pain, neck pain, and rt hip pain.

## 2015-12-23 ENCOUNTER — Ambulatory Visit (HOSPITAL_COMMUNITY): Payer: Medicare HMO | Admitting: Physical Therapy

## 2015-12-23 DIAGNOSIS — R2681 Unsteadiness on feet: Secondary | ICD-10-CM

## 2015-12-23 DIAGNOSIS — R278 Other lack of coordination: Secondary | ICD-10-CM

## 2015-12-23 DIAGNOSIS — M25562 Pain in left knee: Secondary | ICD-10-CM

## 2015-12-23 DIAGNOSIS — M6281 Muscle weakness (generalized): Secondary | ICD-10-CM

## 2015-12-23 DIAGNOSIS — R29898 Other symptoms and signs involving the musculoskeletal system: Secondary | ICD-10-CM

## 2015-12-23 DIAGNOSIS — R51 Headache: Secondary | ICD-10-CM | POA: Diagnosis not present

## 2015-12-23 DIAGNOSIS — Z9181 History of falling: Secondary | ICD-10-CM

## 2015-12-23 DIAGNOSIS — R2689 Other abnormalities of gait and mobility: Secondary | ICD-10-CM

## 2015-12-23 MED ORDER — BUTALBITAL-APAP-CAFF-COD 50-325-40-30 MG PO CAPS: ORAL_CAPSULE | ORAL | Status: DC

## 2015-12-23 MED ORDER — HYDROMORPHONE HCL 1 MG/ML IJ SOLN
1.0000 mg | Freq: Once | INTRAMUSCULAR | Status: AC
  Administered 2015-12-23: 1 mg via INTRAMUSCULAR
  Filled 2015-12-23: qty 1

## 2015-12-23 NOTE — Discharge Instructions (Signed)
Your CT scan is negative for new stroke or emergent conditions. Please use fioricet-codeine every 6 hours with food for pain if needed. See Dr Sudie BaileyKnowlton for follow up, or return to the ED if not improving.

## 2015-12-23 NOTE — ED Provider Notes (Signed)
CSN: [id number]     Arrival date & time 12/22/15  2140 History   First MD Initiated Contact with Patient 12/22/15 2245     Chief Complaint  Patient presents with  . Headache     (Consider location/radiation/quality/duration/timing/severity/associated sxs/prior Treatment) HPI Comments: Patient is a 65 year old female who presents to the emergency department with left-sided headache.  Patient states that on last evening she began to have pain from the midforehead area into the temporal area, and now extending to the area behind the left ear on. The patient complains of some mild neck soreness on, as well as some light sensitivity. She also states that she has pain in her left knee and in her buttocks. She states that the pain is been going on most of the day. She became concerned because she had a stroke in March of this year. When the headache was slow to resolve she came to the emergency department for evaluation, and to make sure she is not having another stroke. She also is requesting assistance with her left knee pain and right hip pain. She has had these problems for quite some time, they are a little more aggravated tonight than usual. The patient denies any difficulty with her vision, no difficulty with her speech or swallowing, and no new difficulty with any strength of her upper or lower extremities.  The history is provided by the patient.    Past Medical History  Diagnosis Date  . HTN (hypertension)   . DM (diabetes mellitus) ([facility])   . Anxiety   . Depression   . Obesity   . Colon polyps   . Complication of anesthesia   . PONV (postoperative nausea and vomiting)   . Agoraphobia with panic disorder   . Chronic pain 03/08/15    Hydrocodone-APAP 10/325 mg, # 120 q month  . Stroke [facility])    Past Surgical History  Procedure Laterality Date  . Cardiac catheterization  2004    normal  . S/p hysterectomy      complete  . Abdominal hysterectomy  15 yrs ago    with bso at aph  .  Cholecystectomy  25 yrs ago    [facility]  . Colonoscopy  05/04/11    left-sided colonic diverticulosis  . Esophagogastroduodenoscopy  05/04/2011    Abnormal distal esophagus, biopsies consistent with inflammation due to acid reflux. No evidence of Barrett's. No H. pylori  . Radiology with anesthesia N/A 08/20/2015    Procedure: RADIOLOGY WITH ANESTHESIA;  Surgeon: Julieanne CottonSanjeev Deveshwar, MD;  [facility];  Service: Radiology;  Laterality: N/A;   Family History  Problem Relation Age of Onset  . Colon cancer Paternal Aunt     2, colon cancer  . Colon cancer Paternal Uncle     6, colon cancer  . Colon cancer Father     deceased, age 66  . Colon cancer      numerous cousins died before age 66 with colon cancer  . Colon cancer Sister     deceased, age 66  . Anesthesia problems Neg Hx   . Hypotension Neg Hx   . Malignant hyperthermia Neg Hx   . Pseudochol deficiency Neg Hx    Social History  Substance Use Topics  . Smoking status: Former Smoker -- 0.25 packs/day for 15 years    Types: Cigarettes    Start date: 07/17/1969    Quit date: 07/18/1971  . Smokeless tobacco: None     Comment: one pack per month, never more  than 1-2 cig/day in entire life  . Alcohol Use: No   OB History    No data available     Review of Systems  Musculoskeletal: Positive for back pain and arthralgias.  Neurological: Positive for headaches.  Psychiatric/Behavioral: The patient is nervous/anxious.   All other systems reviewed and are negative.     Allergies  Naproxen  Home Medications   Prior to Admission medications   Medication Sig Start Date End Date Taking? Authorizing Provider  apixaban (ELIQUIS) 5 MG TABS tablet Take 1 tablet (5 mg total) by mouth 2 (two) times daily. 09/01/15   Layne Benton, NP  atorvastatin (LIPITOR) 20 MG tablet Take 20 mg by mouth at bedtime.     Historical Provider, MD  butalbital-acetaminophen-caffeine (FIORICET/CODEINE) 16-109-60-45 MG capsule 1 or 2 po q6h prn headache  12/23/15   Ivery Quale, PA-C  Cholecalciferol (VITAMIN D-3 PO) Take 1 tablet by mouth daily.    Historical Provider, MD  diazepam (VALIUM) 10 MG tablet  09/13/15   Historical Provider, MD  enalapril (VASOTEC) 20 MG tablet Take 20 mg by mouth 2 (two) times daily.     Historical Provider, MD  fluconazole (DIFLUCAN) 100 MG tablet  10/22/15   Historical Provider, MD  furosemide (LASIX) 40 MG tablet  10/22/15   Historical Provider, MD  glipiZIDE (GLUCOTROL) 10 MG tablet Take 10 mg by mouth every morning. In the AM    Historical Provider, MD  hydrochlorothiazide (HYDRODIURIL) 25 MG tablet Take 25 mg by mouth daily.      Historical Provider, MD  HYDROcodone-acetaminophen (NORCO) 10-325 MG per tablet Take 1 tablet by mouth every 6 (six) hours as needed for moderate pain.    Historical Provider, MD  levothyroxine (SYNTHROID, LEVOTHROID) 25 MCG tablet Take 25 mcg by mouth daily. 07/14/15   Historical Provider, MD  metFORMIN (GLUCOPHAGE) 500 MG tablet Take 1,000 mg by mouth 2 (two) times daily with a meal.     Historical Provider, MD  metoprolol (TOPROL-XL) 50 MG 24 hr tablet Take 50 mg by mouth daily with lunch.     Historical Provider, MD  pioglitazone (ACTOS) 15 MG tablet Take 15 mg by mouth daily.    Historical Provider, MD   BP 131/60 mmHg  Pulse 80  Temp(Src) 98.2 F (36.8 C) (Oral)  Resp 18  Ht  (1.575 m)  Wt 88.451 kg  BMI 35.66 kg/m2  SpO2 99% Physical Exam  Constitutional: She is oriented to person, place, and time. She appears well-developed and well-nourished.  Non-toxic appearance.  HENT:  Head: Normocephalic.  Right Ear: Tympanic membrane and external ear normal.  Left Ear: Tympanic membrane and external ear normal.  Eyes: EOM and lids are normal. Pupils are equal, round, and reactive to light.  Neck: Normal range of motion. Neck supple. Carotid bruit is not present.  Cardiovascular: Normal rate, regular rhythm, normal heart sounds, intact distal pulses and normal pulses.    Pulmonary/Chest: Breath sounds normal. No respiratory distress.  Abdominal: Soft. Bowel sounds are normal. There is no tenderness. There is no guarding.  Musculoskeletal: Normal range of motion.  There is soreness with attempted range of motion of the left knee. There is no hot joints appreciated. There is no effusion appreciated.  Lymphadenopathy:       Head (right side): No submandibular adenopathy present.       Head (left side): No submandibular adenopathy present.    She has no cervical adenopathy.  Neurological: She is alert and oriented  to person, place, and time. She has normal strength. No cranial nerve deficit or sensory deficit.  Pt is ambulatory at her baseline. Speech is at her baseline. There is mild facial asymmetry, as a residual from previous stroke. No new asymmetry according to the patient's husband.  Skin: Skin is warm and dry.  Psychiatric: She has a normal mood and affect. Her speech is normal.  Nursing note and vitals reviewed.   ED Course  Procedures (including critical care time) Labs Review Labs Reviewed - No data to display  Imaging Review Ct Head Wo Contrast  12/22/2015  CLINICAL DATA:  Left-sided headaches for 2 days, initial encounter EXAM: CT HEAD WITHOUT CONTRAST TECHNIQUE: Contiguous axial images were obtained from the base of the skull through the vertex without intravenous contrast. COMPARISON:  09/28/2015 MRI of the head FINDINGS: Bony calvarium is intact. Changes of prior right MCA infarct are again seen. Involvement of the right basal ganglia is also now identified. No findings to suggest acute hemorrhage, acute infarction or space-occupying mass lesion are noted. IMPRESSION: Prior infarcts on the right similar to that seen on recent MRI. No acute intracranial abnormality noted. Electronically Signed   By: Alcide Clever M.D.   On: 12/22/2015 23:46   I have personally reviewed and evaluated these images and lab results as part of my medical  decision-making.   EKG Interpretation None      MDM CT scan is negative for any new cerebrovascular problems, no evidence of any stroke or infection. There no new gross neurologic deficits appreciated. The patient seems to be having an exacerbation of degenerative joint disease as far as the knee is concerned. There is no evidence for septic joint.  Patient was treated with hydrocodone and Compazine. The patient states that this helped the pain in her hip, buttocks, and knee, but did not help her headache. Patient was given intramuscular Dilaudid.  Patient amateur he at the time of discharge without problem. No new neurologic deficits appreciated. The patient will follow with her primary care physician for recheck. She will return to the emergency department, if any new changes, problems, or concerns.    Final diagnoses:  Nonintractable headache, unspecified chronicity pattern, unspecified headache type    *I have reviewed nursing notes, vital signs, and all appropriate lab and imaging results for this patient.[address] PA-C 12/23/15 1610  Vanetta Mulders, MD 12/23/15 1537

## 2015-12-23 NOTE — Therapy (Signed)
[facility] [address] [place], [place], [address] Phone: [phone]   Fax:  [phone]  Physical Therapy Treatment  Patient Details  Name: Megan [name] MRN: [id number] Date of Birth: Mar 13, 1951 Referring Provider: Antony Contras  Encounter Date: 12/23/2015      PT End of Session - 12/23/15 1627    Visit Number 8   Number of Visits 24   Date for PT Re-Evaluation 01/13/16   Authorization Type [employer]   Authorization Time Period 11/18/15 to 01/13/16   Authorization - Visit Number 8   Authorization - Number of Visits 24   PT Start Time 6948   PT Stop Time 1514   PT Time Calculation (min) 39 min   Equipment Utilized During Treatment Gait belt   Activity Tolerance Patient tolerated treatment well   Behavior During Therapy [facility] for tasks assessed/performed      Past Medical History  Diagnosis Date  . HTN (hypertension)   . DM (diabetes mellitus) ([place])   . Anxiety   . Depression   . Obesity   . Colon polyps   . Complication of anesthesia   . PONV (postoperative nausea and vomiting)   . Agoraphobia with panic disorder   . Chronic pain 03/08/15    Hydrocodone-APAP 10/325 mg, # 120 q month  . Stroke [facility])     Past Surgical History  Procedure Laterality Date  . Cardiac catheterization  2004    normal  . S/p hysterectomy      complete  . Abdominal hysterectomy  15 yrs ago    with bso at aph  . Cholecystectomy  25 yrs ago    [facility]  . Colonoscopy  05/04/11    left-sided colonic diverticulosis  . Esophagogastroduodenoscopy  05/04/2011    Abnormal distal esophagus, biopsies consistent with inflammation due to acid reflux. No evidence of Barrett's. No H. pylori  . Radiology with anesthesia N/A 08/20/2015    Procedure: RADIOLOGY WITH ANESTHESIA;  Surgeon: Luanne Bras, MD;  Location: [place];  Service: Radiology;  Laterality: N/A;    There were no vitals filed for this visit.      Subjective Assessment -  12/23/15 1452    Subjective Pt states she went to the ER last night with a headache and increased LUE weakness. She is unsure what was causing it, but states she is feeling better now.    Pertinent History HTN, DM, obesity, stroke 08/21/15   Limitations --  nothing    How long can you sit comfortably? unlimited   How long can you stand comfortably? 25 minutes due to her back   How long can you walk comfortably? no issues   Patient Stated Goals improve strength get back to PLOF   Currently in Pain? No/denies                         [facility] Adult PT Treatment/Exercise - 12/23/15 0001    Knee/Hip Exercises: Standing   Forward Step Up Left;2 sets;15 reps;Hand Hold: 1;Step Height: 6"   Knee/Hip Exercises: Seated   Marching AROM;Both;1 set;20 reps   Marching Limitations verbal cues to increase LLE flexion   Knee/Hip Exercises: Supine   Bridges Both;3 sets;5 reps  10 reps with last set             Balance Exercises - 12/23/15 1632    Balance Exercises: Standing   Standing Eyes Opened Narrow base of support (BOS);2 reps;Solid surface  trunk rotation cone reach   Tandem Stance Eyes open;Intermittent upper extremity support;Upper extremity support 1  cone rotation with several LOB and needing 1 UE support    Tandem Gait Forward;2 reps  CGA   Retro Gait 2 reps  firm surface, CGA           PT Education - 12/23/15 1626    Education provided Yes   Education Details Encouraged HEP adherence    Person(s) Educated Patient   Methods Explanation   Comprehension Verbalized understanding          PT Short Term Goals - 12/21/15 1421    PT SHORT TERM GOAL #1   Title Pt will demo understanding and indep with her HEP, to be updated throughout her POC.   Time 2   Period Weeks   Status Achieved   PT SHORT TERM GOAL #2   Title Pt will demo correct sequencing with SPC x25 ft and min cues from therapist, x3 trials.   Baseline Not using SPC   Time 4   Period Weeks    Status Unable to assess   PT SHORT TERM GOAL #3   Title Pt will demo improved safety awareness evident by her ability to ascend/descend 5 6" steps with reciprocal pattern and LRAD with supervision only.    Time 4   Period Weeks   Status New   PT SHORT TERM GOAL #4   Title Pt will demo improved knee strength to 5/5 MMT to improve safety and stability during ambulation and stair negotiation.    Baseline hip extensor strength 2/5 MMT   Time 4   Period Weeks   Status Partially Met   PT SHORT TERM GOAL #5   Title Pt will transfer supine to/from sit each direction with correct log roll technique and with supervision only, x5 trials to improve her independence at home.    Baseline demo with cues from therapist   Time 4   Period Weeks   Status Partially Met           PT Long Term Goals - 12/21/15 1424    PT LONG TERM GOAL #1   Title Pt will demo improved LE strength to atleast 4/5 to improve her functional mobility.   Baseline except hip extension 2/5 MMT   Time 8   Period Weeks   Status Partially Met   PT LONG TERM GOAL #2   Title Pt will demo improved functional strength and power evident by a decrease in her 5x sit to stand time to under 12 sec.   Time 8   Period Weeks   Status Partially Met   PT LONG TERM GOAL #3   Title Pt will demo improved TUG time to less than 14 sec to decrease her ris kof falls at home and in the community.    Time 8   Period Weeks   Status Partially Met               Plan - 12/23/15 1627    Clinical Impression Statement Pt arrived today in a wheelchair and reporting she went to the ER last night due to headache and possible stroke symptoms. Upon arrival, BP was checked to be 130/62 with no reported headache. PT did notice increased slurring of speech and evident LUE weakness more so than usual as she was unable to pull her hair back as she usually can. Session began with therex on the mat and progressed to standing balance with noted  difficulty  and LOB with tandem stance rotation. Encouraged pt to continue with HEP adherence at this time and she ended the session feeling much stronger than when she arrived. Will continue with current POC.   Rehab Potential Good   Clinical Impairments Affecting Rehab Potential acuity of stroke   PT Frequency 3x / week   PT Duration 8 weeks   PT Treatment/Interventions ADLs/Self Care Home Management;Cryotherapy;Patient/family education;Neuromuscular re-education;Balance training;Therapeutic exercise;Therapeutic activities;Functional mobility training;Stair training;Gait training;Manual techniques   PT Next Visit Plan hip extensor strength   PT Home Exercise Plan no updates this session   Consulted and Agree with Plan of Care Patient      Patient will benefit from skilled therapeutic intervention in order to improve the following deficits and impairments:  Abnormal gait, Decreased activity tolerance, Decreased balance, Decreased endurance, Decreased coordination, Decreased safety awareness, Decreased mobility, Decreased knowledge of use of DME, Decreased strength, Difficulty walking, Dizziness, Impaired flexibility, Improper body mechanics, Postural dysfunction, Pain  Visit Diagnosis: Other symptoms and signs involving the musculoskeletal system  Other lack of coordination  Muscle weakness (generalized)  Unsteadiness on feet  Other abnormalities of gait and mobility  Pain in left knee  History of falling     Problem List Patient Active Problem List   Diagnosis Date Noted  . Cerebral infarction due to unspecified mechanism   . Knee pain, acute   . Cephalalgia   . GERD (gastroesophageal reflux disease) 06/14/2011  . FH: colon cancer 04/05/2011  . History of colon polyps 04/05/2011  . Left sided abdominal pain 04/05/2011  . N&V (nausea and vomiting) 04/05/2011    4:35 PM,12/23/2015 Elly Modena PT, DPT [facility] [place] [address] [place], [place], [address] Phone: [phone]   Fax:  [phone]  Name: Megan [name] MRN: [id number] Date of Birth: 24-Jan-1951

## 2015-12-24 ENCOUNTER — Encounter (HOSPITAL_COMMUNITY): Payer: Self-pay | Admitting: Cardiology

## 2015-12-24 ENCOUNTER — Emergency Department (HOSPITAL_COMMUNITY): Payer: Medicare HMO

## 2015-12-24 ENCOUNTER — Emergency Department (HOSPITAL_COMMUNITY)
Admission: EM | Admit: 2015-12-24 | Discharge: 2015-12-24 | Disposition: A | Discharge status: Home / Self Care | Payer: Medicare HMO | Attending: Emergency Medicine | Admitting: Emergency Medicine

## 2015-12-24 DIAGNOSIS — Z8673 Personal history of transient ischemic attack (TIA), and cerebral infarction without residual deficits: Secondary | ICD-10-CM | POA: Diagnosis not present

## 2015-12-24 DIAGNOSIS — Z6835 Body mass index (BMI) 35.0-35.9, adult: Secondary | ICD-10-CM | POA: Diagnosis not present

## 2015-12-24 DIAGNOSIS — I10 Essential (primary) hypertension: Secondary | ICD-10-CM | POA: Insufficient documentation

## 2015-12-24 DIAGNOSIS — W0110XA Fall on same level from slipping, tripping and stumbling with subsequent striking against unspecified object, initial encounter: Secondary | ICD-10-CM | POA: Diagnosis not present

## 2015-12-24 DIAGNOSIS — E119 Type 2 diabetes mellitus without complications: Secondary | ICD-10-CM | POA: Insufficient documentation

## 2015-12-24 DIAGNOSIS — Y999 Unspecified external cause status: Secondary | ICD-10-CM | POA: Insufficient documentation

## 2015-12-24 DIAGNOSIS — Y929 Unspecified place or not applicable: Secondary | ICD-10-CM | POA: Insufficient documentation

## 2015-12-24 DIAGNOSIS — S0993XA Unspecified injury of face, initial encounter: Secondary | ICD-10-CM | POA: Diagnosis present

## 2015-12-24 DIAGNOSIS — Z7984 Long term (current) use of oral hypoglycemic drugs: Secondary | ICD-10-CM | POA: Insufficient documentation

## 2015-12-24 DIAGNOSIS — E669 Obesity, unspecified: Secondary | ICD-10-CM | POA: Diagnosis not present

## 2015-12-24 DIAGNOSIS — F329 Major depressive disorder, single episode, unspecified: Secondary | ICD-10-CM | POA: Insufficient documentation

## 2015-12-24 DIAGNOSIS — S02609A Fracture of mandible, unspecified, initial encounter for closed fracture: Secondary | ICD-10-CM | POA: Diagnosis not present

## 2015-12-24 DIAGNOSIS — Y939 Activity, unspecified: Secondary | ICD-10-CM | POA: Diagnosis not present

## 2015-12-24 DIAGNOSIS — Z79899 Other long term (current) drug therapy: Secondary | ICD-10-CM | POA: Insufficient documentation

## 2015-12-24 DIAGNOSIS — Z87891 Personal history of nicotine dependence: Secondary | ICD-10-CM | POA: Insufficient documentation

## 2015-12-24 DIAGNOSIS — S0240DA Maxillary fracture, left side, initial encounter for closed fracture: Secondary | ICD-10-CM | POA: Diagnosis not present

## 2015-12-24 DIAGNOSIS — S02401A Maxillary fracture, unspecified, initial encounter for closed fracture: Secondary | ICD-10-CM

## 2015-12-24 MED ORDER — HYDROCODONE-ACETAMINOPHEN 10-325 MG PO TABS: 1.0000 | ORAL_TABLET | Freq: Four times a day (QID) | ORAL | Status: DC | PRN

## 2015-12-24 MED ORDER — HYDROMORPHONE HCL 1 MG/ML IJ SOLN
1.0000 mg | Freq: Once | INTRAMUSCULAR | Status: AC
  Administered 2015-12-24: 1 mg via INTRAMUSCULAR
  Filled 2015-12-24: qty 1

## 2015-12-24 MED ORDER — ONDANSETRON HCL 4 MG/2ML IJ SOLN
4.0000 mg | Freq: Once | INTRAMUSCULAR | Status: AC
  Administered 2015-12-24: 4 mg via INTRAMUSCULAR
  Filled 2015-12-24: qty 2

## 2015-12-24 NOTE — ED Notes (Signed)
Fall this morning coming down steps.  Cat tripped her.  Hit left side of face on cinderblock.  Swelling and bruising  To left side of face.  C/o pain to bilateral knees.  Take eliquis for CVA in February.

## 2015-12-24 NOTE — Discharge Instructions (Signed)
Orbital Floor Fracture, Non-Blowout The eye sits in the part of the skull called the "orbit." The upper and outside walls of the orbit are thick and strong. The inside wall (near the nose) and the orbital floor are very thin and weak. The tissues around the eye will briefly press together if there is a direct blow to the front of the eye. This leads to high pressure against the orbital walls. The inside wall and the orbital floor may break since these are the weakest walls. If the orbital floor breaks, the tissues around the eye, including the muscle that makes the eye look down, may become trapped in the sinus below when the orbital floor "blows out." If a blowout does not happen, the orbital floor fracture is considered a non-blowout orbital fracture. CAUSES An orbital floor fracture can be caused by any accident in which an object hits the face or the face strikes against a hard object. The most common ways that people break their eye socket include:  Being hit by a blunt object, such as a baseball bat or a fist.  Striking the face on the car dashboard during a crash.  Falls.  Gunshot. SYMPTOMS  If there has been no injury to the eye itself, symptoms may include:  Puffiness (swelling) and bruising around the eye area (black eye).  Numbness of the cheek and upper gum on the side with the floor fracture. This is caused by nerve injury to these areas.  Pain around the eye.  Headache.  Ear pain on the injured side. DIAGNOSIS The diagnosis of an orbital floor fracture is suspected during an eye exam by an ophthalmologist. It is confirmed by X-rays or CT scan. TREATMENT Your caregiver may suggest waiting 1 or 2 weeks for the swelling to go away before examining the eye. When the swelling lessens, your caregiver will examine the eye to see if there is any sign of a trapped muscle or double vision when looking in different directions. If double vision is not found and muscle or tissue did not  get trapped, no further treatment is necessary. After that, in almost all cases, the bones heal together on their own.  HOME CARE INSTRUCTIONS  Take all pain medicine as directed by your caregiver.  Use ice packs or other cold therapy to reduce swelling as directed by your caregiver.  Do not put a contact lens in the injured eye until your caregiver approves.  Avoid dusty environments.  Always wear protective glasses or goggles when recommended. Wearing protective eyewear is not dangerous to your injured eye and will not delay healing.  As long as your other eye is seeing normally, you may return to work and drive.  You may travel by plane or be in high altitudes. However, your swelling may take longer to go away, and you may have sinus pain.  Be aware that your depth perception and your ability to judge distance may be reduced or lost. SEEK IMMEDIATE MEDICAL CARE IF:  Your vision changes.  Your redness or swelling persists around the injured eye or gets worse.  You start to have double vision.  You have a bloody or discolored discharge from your nose.  You have a fever that lasts longer than 2 to 3 days.  You have a fever that suddenly gets worse.  Your cheek or upper gum numbness does not go away. MAKE SURE YOU:  Understand these instructions.  Will watch your condition.  Will get help right away if  that suddenly gets worse.  · Your cheek or upper gum numbness does not go away.  MAKE SURE YOU:  · Understand these instructions.  · Will watch your condition.  · Will get help right away if you are not doing well or get worse.     This information is not intended to replace advice given to you by your health care provider. Make sure you discuss any questions you have with your health care provider.     Document Released: 09/25/2011 Document Revised: 07/24/2014 Document Reviewed: 09/25/2011  [employer]vier Interactive Patient Education ©2016 [employer]

## 2015-12-24 NOTE — ED Provider Notes (Signed)
CSN: [id number]     Arrival date & time 12/24/15  4098 History   First MD Initiated Contact with Patient 12/24/15 0935     Chief Complaint  Patient presents with  . Fall     (Consider location/radiation/quality/duration/timing/severity/associated sxs/prior Treatment) Patient is a 65 y.o. Megan [name] presenting with fall. The history is provided by the patient. No language interpreter was used.  Fall This is a new problem. The current episode started today. The problem occurs constantly. The problem has been gradually worsening. Associated symptoms include headaches and nausea. Nothing aggravates the symptoms. The treatment provided moderate relief.  Pt reports she tripped over her cat and fell hitting her face.  Pt recently had a stroke and has been having balance problems even without the cat. Pt is curently going to Physical therapy.   Past Medical History  Diagnosis Date  . HTN (hypertension)   . DM (diabetes mellitus) (HCC)   . Anxiety   . Depression   . Obesity   . Colon polyps   . Complication of anesthesia   . PONV (postoperative nausea and vomiting)   . Agoraphobia with panic disorder   . Chronic pain 03/08/15    Hydrocodone-APAP 10/325 mg, # 120 q month  . Stroke [facility])    Past Surgical History  Procedure Laterality Date  . Cardiac catheterization  2004    normal  . S/p hysterectomy      complete  . Abdominal hysterectomy  15 yrs ago    with bso at aph  . Cholecystectomy  25 yrs ago    [facility]  . Colonoscopy  05/04/11    left-sided colonic diverticulosis  . Esophagogastroduodenoscopy  05/04/2011    Abnormal distal esophagus, biopsies consistent with inflammation due to acid reflux. No evidence of Barrett's. No H. pylori  . Radiology with anesthesia N/A 08/20/2015    Procedure: RADIOLOGY WITH ANESTHESIA;  Surgeon: Julieanne Cotton, MD;  Location: [facility] OR;  Service: Radiology;  Laterality: N/A;   Family History  Problem Relation Age of Onset  . Colon cancer Paternal Aunt      2, colon cancer  . Colon cancer Paternal Uncle     6, colon cancer  . Colon cancer Father     deceased, age 61  . Colon cancer      numerous cousins died before age 29 with colon cancer  . Colon cancer Sister     deceased, age 18  . Anesthesia problems Neg Hx   . Hypotension Neg Hx   . Malignant hyperthermia Neg Hx   . Pseudochol deficiency Neg Hx    Social History  Substance Use Topics  . Smoking status: Former Smoker -- 0.25 packs/day for 15 years    Types: Cigarettes    Start date: 07/17/1969    Quit date: 07/18/1971  . Smokeless tobacco: None     Comment: one pack per month, never more than 1-2 cig/day in entire life  . Alcohol Use: No   OB History    No data available     Review of Systems  Gastrointestinal: Positive for nausea.  Neurological: Positive for headaches.  All other systems reviewed and are negative.     Allergies  Naproxen  Home Medications   Prior to Admission medications   Medication Sig Start Date End Date Taking? Authorizing Provider  apixaban (ELIQUIS) 5 MG TABS tablet Take 1 tablet (5 mg total) by mouth 2 (two) times daily. 09/01/15   Layne Benton, NP  atorvastatin (LIPITOR) 20  MG tablet Take 20 mg by mouth at bedtime.     Historical Provider, MD  butalbital-acetaminophen-caffeine (FIORICET/CODEINE) 16-109-60-45 MG capsule 1 or 2 po q6h prn headache 12/23/15   Ivery Quale, PA-C  Cholecalciferol (VITAMIN D-3 PO) Take 1 tablet by mouth daily.    Historical Provider, MD  diazepam (VALIUM) 10 MG tablet  09/13/15   Historical Provider, MD  enalapril (VASOTEC) 20 MG tablet Take 20 mg by mouth 2 (two) times daily.     Historical Provider, MD  fluconazole (DIFLUCAN) 100 MG tablet  10/22/15   Historical Provider, MD  furosemide (LASIX) 40 MG tablet  10/22/15   Historical Provider, MD  glipiZIDE (GLUCOTROL) 10 MG tablet Take 10 mg by mouth every morning. In the AM    Historical Provider, MD  hydrochlorothiazide (HYDRODIURIL) 25 MG tablet Take 25 mg by  mouth daily.      Historical Provider, MD  HYDROcodone-acetaminophen (NORCO) 10-325 MG per tablet Take 1 tablet by mouth every 6 (six) hours as needed for moderate pain.    Historical Provider, MD  levothyroxine (SYNTHROID, LEVOTHROID) 25 MCG tablet Take 25 mcg by mouth daily. 07/14/15   Historical Provider, MD  metFORMIN (GLUCOPHAGE) 500 MG tablet Take 1,000 mg by mouth 2 (two) times daily with a meal.     Historical Provider, MD  metoprolol (TOPROL-XL) 50 MG 24 hr tablet Take 50 mg by mouth daily with lunch.     Historical Provider, MD  pioglitazone (ACTOS) 15 MG tablet Take 15 mg by mouth daily.    Historical Provider, MD   BP 132/70 mmHg  Pulse 62  Temp(Src) 97.8 F (36.6 C) (Oral)  Resp 16  Ht 5\' 2"  (1.575 m)  Wt 87.998 kg  BMI 35.47 kg/m2  SpO2 98% Physical Exam  Constitutional: She is oriented to person, place, and time. She appears well-developed and well-nourished.  HENT:  Head: Normocephalic and atraumatic.  Right Ear: External ear normal.  Left Ear: External ear normal.  Nose: Nose normal.  Mouth/Throat: Oropharynx is clear and moist.  Eyes: Conjunctivae and EOM are normal. Pupils are equal, round, and reactive to light.  Neck: Normal range of motion.  Cardiovascular: Normal rate and normal heart sounds.   Pulmonary/Chest: Effort normal.  Abdominal: She exhibits no distension.  Musculoskeletal: Normal range of motion.  Neurological: She is alert and oriented to person, place, and time.  Skin: Skin is warm.  Psychiatric: She has a normal mood and affect.  Nursing note and vitals reviewed.   ED Course  Procedures (including critical care time) Labs Review Labs Reviewed - No data to display  Imaging Review Ct Head Wo Contrast  12/24/2015  CLINICAL DATA:  Pain following fall EXAM: CT HEAD WITHOUT CONTRAST CT MAXILLOFACIAL WITHOUT CONTRAST TECHNIQUE: Multidetector CT imaging of the head and maxillofacial structures were performed using the standard protocol without  intravenous contrast. Multiplanar CT image reconstructions of the maxillofacial structures were also generated. COMPARISON:  Head CT December 22, 2015 FINDINGS: CT HEAD FINDINGS Mild diffuse atrophy is stable. There is no intracranial mass, hemorrhage, extra-axial fluid collection, or midline shift. There is a prior infarct involving much of the right lentiform nucleus as well as essentially all of the right internal and external capsules. There is evidence of a prior infarct in the anterior right temporal lobe as well as in the right posterior frontal lobe, stable. There is slight small vessel disease in the centra semiovale bilaterally. No new gray-white compartment lesions are evident. The bony calvarium appears  intact. The mastoid air cells are clear. CT MAXILLOFACIAL FINDINGS There is a nondisplaced fracture of the anterior left maxillary antrum. There is a mildly displaced fracture along the inferior aspect of the lateral maxillary antrum with the more superior bony fragment displaced medially with respect to the inferior bony fragment in this area. There is diffuse opacification of the left maxillary antrum. The left orbital floor appears intact as does the medial wall of the left maxillary antrum. No other fractures are evident. No dislocation. There is diffuse soft tissue swelling over the left mid upper face with developing areas of hematoma in the left mid upper face. There is also preseptal edema over the left orbit. There is no intraorbital lesion. Orbits appear symmetric bilaterally. Paranasal sinuses are clear apart from the apparent hemorrhage in the left maxillary antrum. The ostiomeatal unit complex on the left is obstructed. The ostiomeatal unit complex the right is patent. There is probable hemorrhage in the left near ease with edema of the left nasal turbinate. There is rightward deviation of the nasal septum. Salivary glands appear normal. No adenopathy is evident. Visualized pharynx appears  unremarkable. IMPRESSION: CT head: Atrophy with prior right-sided infarcts, stable. Mild periventricular small vessel disease. No intracranial mass, hemorrhage, or extra-axial fluid collection. No acute infarct evident. CT maxillofacial: Fractures of the anterior and lateral left maxillary antrum. There is mild displacement of fracture fragments along the inferior aspect of the lateral maxillary antrum. The anterior maxillary antral fracture is nondisplaced. No other fractures are evident. No dislocations. There is moderate soft tissue swelling over the left face with developing hematoma. There is preseptal edema over the left orbit. There is no intraorbital lesion on either side. Paranasal sinuses are clear except for the hemorrhage in the left maxillary antrum. Partial obstruction of the left naris is probably also due to hemorrhage. There is obstruction of the ostiomeatal unit complex on the left. The ostiomeatal unit complex on the right is patent. Rightward deviation of nasal septum noted. Electronically Signed   By: Bretta Bang III M.D.   On: 12/24/2015 10:45   Ct Head Wo Contrast  12/22/2015  CLINICAL DATA:  Left-sided headaches for 2 days, initial encounter EXAM: CT HEAD WITHOUT CONTRAST TECHNIQUE: Contiguous axial images were obtained from the base of the skull through the vertex without intravenous contrast. COMPARISON:  09/28/2015 MRI of the head FINDINGS: Bony calvarium is intact. Changes of prior right MCA infarct are again seen. Involvement of the right basal ganglia is also now identified. No findings to suggest acute hemorrhage, acute infarction or space-occupying mass lesion are noted. IMPRESSION: Prior infarcts on the right similar to that seen on recent MRI. No acute intracranial abnormality noted. Electronically Signed   By: Alcide Clever M.D.   On: 12/22/2015 23:46   Ct Maxillofacial Wo Cm  12/24/2015  CLINICAL DATA:  Pain following fall EXAM: CT HEAD WITHOUT CONTRAST CT MAXILLOFACIAL  WITHOUT CONTRAST TECHNIQUE: Multidetector CT imaging of the head and maxillofacial structures were performed using the standard protocol without intravenous contrast. Multiplanar CT image reconstructions of the maxillofacial structures were also generated. COMPARISON:  Head CT December 22, 2015 FINDINGS: CT HEAD FINDINGS Mild diffuse atrophy is stable. There is no intracranial mass, hemorrhage, extra-axial fluid collection, or midline shift. There is a prior infarct involving much of the right lentiform nucleus as well as essentially all of the right internal and external capsules. There is evidence of a prior infarct in the anterior right temporal lobe as well as in  the right posterior frontal lobe, stable. There is slight small vessel disease in the centra semiovale bilaterally. No new gray-white compartment lesions are evident. The bony calvarium appears intact. The mastoid air cells are clear. CT MAXILLOFACIAL FINDINGS There is a nondisplaced fracture of the anterior left maxillary antrum. There is a mildly displaced fracture along the inferior aspect of the lateral maxillary antrum with the more superior bony fragment displaced medially with respect to the inferior bony fragment in this area. There is diffuse opacification of the left maxillary antrum. The left orbital floor appears intact as does the medial wall of the left maxillary antrum. No other fractures are evident. No dislocation. There is diffuse soft tissue swelling over the left mid upper face with developing areas of hematoma in the left mid upper face. There is also preseptal edema over the left orbit. There is no intraorbital lesion. Orbits appear symmetric bilaterally. Paranasal sinuses are clear apart from the apparent hemorrhage in the left maxillary antrum. The ostiomeatal unit complex on the left is obstructed. The ostiomeatal unit complex the right is patent. There is probable hemorrhage in the left near ease with edema of the left nasal  turbinate. There is rightward deviation of the nasal septum. Salivary glands appear normal. No adenopathy is evident. Visualized pharynx appears unremarkable. IMPRESSION: CT head: Atrophy with prior right-sided infarcts, stable. Mild periventricular small vessel disease. No intracranial mass, hemorrhage, or extra-axial fluid collection. No acute infarct evident. CT maxillofacial: Fractures of the anterior and lateral left maxillary antrum. There is mild displacement of fracture fragments along the inferior aspect of the lateral maxillary antrum. The anterior maxillary antral fracture is nondisplaced. No other fractures are evident. No dislocations. There is moderate soft tissue swelling over the left face with developing hematoma. There is preseptal edema over the left orbit. There is no intraorbital lesion on either side. Paranasal sinuses are clear except for the hemorrhage in the left maxillary antrum. Partial obstruction of the left naris is probably also due to hemorrhage. There is obstruction of the ostiomeatal unit complex on the left. The ostiomeatal unit complex on the right is patent. Rightward deviation of nasal septum noted. Electronically Signed   By: Bretta BangWilliam  Woodruff III M.D.   On: 12/24/2015 10:45   I have personally reviewed and evaluated these images and lab results as part of my medical decision-making.   EKG Interpretation None      MDM Ct shows nondisplaced fracture anterior maxillary sinus and and mildly dispaced inferior sinus fracture  Ct head no acute    Final diagnoses:  Maxillary sinus fracture, closed, initial encounter (HCC)    Meds ordered this encounter  Medications  . HYDROmorphone (DILAUDID) injection 1 mg    Sig:   . ondansetron (ZOFRAN) injection 4 mg    Sig:   . HYDROcodone-acetaminophen (NORCO) 10-325 MG tablet    Sig: Take 1 tablet by mouth every 6 (six) hours as needed for moderate pain.    Dispense:  20 tablet    Refill:  0    Order Specific Question:   Supervising Provider    Answer:  Eber HongMILLER, BRIAN [[id number]]   An After Visit Summary was printed and given to the patient. Schedule to see your the Ent for recheck   Elson AreasLeslie K Rajan Burgard, PA-C 12/24/15 1556  Marily MemosJason Mesner, MD 12/25/15 [phone]

## 2015-12-24 NOTE — ED Provider Notes (Signed)
Medical screening examination/treatment/procedure(s) were conducted as a shared visit with non-physician practitioner(s) and myself.  I personally evaluated the patient during the encounter.  Mechanical fall (over a cat) today with resultant facial pain. Exam with ecchymosis around left eye, significant ttp. EOM intact. Vision undisturbed. No proptosis. Plan for imaging of face/head and symptomatic care.   Marily MemosJason Angelic Schnelle, MD 12/24/15 [phone]

## 2015-12-24 NOTE — ED Notes (Signed)
Pt ambulatory to restroom with assist.  Denies any complaints.

## 2015-12-27 ENCOUNTER — Encounter (HOSPITAL_COMMUNITY): Payer: Commercial Managed Care - HMO | Admitting: Specialist

## 2015-12-27 ENCOUNTER — Telehealth (HOSPITAL_COMMUNITY): Payer: Self-pay | Admitting: Physical Therapy
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
## 2015-12-28 ENCOUNTER — Ambulatory Visit (HOSPITAL_COMMUNITY): Payer: Medicare HMO | Admitting: Physical Therapy

## 2015-12-29 ENCOUNTER — Encounter (HOSPITAL_COMMUNITY): Payer: Medicare HMO | Admitting: Physical Therapy

## 2015-12-30 ENCOUNTER — Encounter (HOSPITAL_COMMUNITY): Payer: Commercial Managed Care - HMO | Admitting: Physical Therapy

## 2016-01-03 ENCOUNTER — Encounter (HOSPITAL_COMMUNITY): Payer: Commercial Managed Care - HMO

## 2016-01-04 ENCOUNTER — Ambulatory Visit (HOSPITAL_COMMUNITY): Payer: Medicare HMO | Admitting: Physical Therapy

## 2016-01-04 DIAGNOSIS — M25562 Pain in left knee: Secondary | ICD-10-CM

## 2016-01-04 DIAGNOSIS — R29898 Other symptoms and signs involving the musculoskeletal system: Secondary | ICD-10-CM | POA: Diagnosis not present

## 2016-01-04 DIAGNOSIS — R278 Other lack of coordination: Secondary | ICD-10-CM

## 2016-01-04 DIAGNOSIS — R2689 Other abnormalities of gait and mobility: Secondary | ICD-10-CM

## 2016-01-04 DIAGNOSIS — M6281 Muscle weakness (generalized): Secondary | ICD-10-CM

## 2016-01-04 DIAGNOSIS — Z9181 History of falling: Secondary | ICD-10-CM

## 2016-01-04 DIAGNOSIS — R2681 Unsteadiness on feet: Secondary | ICD-10-CM
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
## 2016-01-06 ENCOUNTER — Ambulatory Visit (HOSPITAL_COMMUNITY): Payer: Medicare HMO | Admitting: Physical Therapy

## 2016-01-06 DIAGNOSIS — R278 Other lack of coordination: Secondary | ICD-10-CM

## 2016-01-06 DIAGNOSIS — Z9181 History of falling: Secondary | ICD-10-CM

## 2016-01-06 DIAGNOSIS — R29898 Other symptoms and signs involving the musculoskeletal system: Secondary | ICD-10-CM

## 2016-01-06 DIAGNOSIS — R2681 Unsteadiness on feet: Secondary | ICD-10-CM

## 2016-01-06 DIAGNOSIS — R2689 Other abnormalities of gait and mobility: Secondary | ICD-10-CM

## 2016-01-06 DIAGNOSIS — M6281 Muscle weakness (generalized): Secondary | ICD-10-CM

## 2016-01-06 DIAGNOSIS — M25562 Pain in left knee: Secondary | ICD-10-CM

## 2016-01-06 NOTE — Therapy (Signed)
[facility] [address] [place], [place], [address] Phone: [phone]   Fax:  [phone]  Physical Therapy Treatment  Patient Details  Name: Megan [name] MRN: [id number] Date of Birth: May 27, 1951 Referring Provider: Antony Contras  Encounter Date: 01/06/2016      PT End of Session - 01/06/16 1450    Visit Number 10   Number of Visits 24   Date for PT Re-Evaluation 01/13/16   Authorization Type [employer]   Authorization Time Period 11/18/15 to 01/13/16   Authorization - Visit Number 10   Authorization - Number of Visits 24   PT Start Time 1430   PT Stop Time 1511   PT Time Calculation (min) 41 min   Equipment Utilized During Treatment Gait belt   Activity Tolerance Patient tolerated treatment well   Behavior During Therapy [facility] for tasks assessed/performed      Past Medical History  Diagnosis Date  . HTN (hypertension)   . DM (diabetes mellitus) ([place])   . Anxiety   . Depression   . Obesity   . Colon polyps   . Complication of anesthesia   . PONV (postoperative nausea and vomiting)   . Agoraphobia with panic disorder   . Chronic pain 03/08/15    Hydrocodone-APAP 10/325 mg, # 120 q month  . Stroke [facility])     Past Surgical History  Procedure Laterality Date  . Cardiac catheterization  2004    normal  . S/p hysterectomy      complete  . Abdominal hysterectomy  15 yrs ago    with bso at aph  . Cholecystectomy  25 yrs ago    [facility]  . Colonoscopy  05/04/11    left-sided colonic diverticulosis  . Esophagogastroduodenoscopy  05/04/2011    Abnormal distal esophagus, biopsies consistent with inflammation due to acid reflux. No evidence of Barrett's. No H. pylori  . Radiology with anesthesia N/A 08/20/2015    Procedure: RADIOLOGY WITH ANESTHESIA;  Surgeon: Luanne Bras, MD;  Location: [place];  Service: Radiology;  Laterality: N/A;    There were no vitals filed for this visit.      Subjective Assessment -  01/06/16 1432    Subjective Pt states she is doing good but is a little stopped up today. She took Mucinex earlier and it seems to have helped. No other concerns at this time.    Pertinent History HTN, DM, obesity, stroke 08/21/15   Limitations --  nothing    How long can you sit comfortably? unlimited   How long can you stand comfortably? 25 minutes due to her back   How long can you walk comfortably? no issues   Patient Stated Goals improve strength get back to PLOF   Currently in Pain? No/denies                         [facility] Adult PT Treatment/Exercise - 01/06/16 0001    Knee/Hip Exercises: Aerobic   Nustep beginning at L2, increasing 1 L up to L5   Knee/Hip Exercises: Standing   Hip Extension Both;2 sets;15 reps  green TB   Wall Squat 2 sets;10 reps   Other Standing Knee Exercises sidestepping with green TB around knees x5 RT             Balance Exercises - 01/06/16 1501    Balance Exercises: Standing   Standing Eyes Opened Narrow base of support (BOS);Foam/compliant surface;3 reps  cone rotation  to each side using D1 pattern   Tandem Gait Forward  x2 LOB           PT Education - 01/06/16 1449    Education provided Yes   Education Details updated HEP   Person(s) Educated Patient   Methods Explanation;Handout;Demonstration   Comprehension Verbalized understanding;Returned demonstration          PT Short Term Goals - 12/21/15 1421    PT SHORT TERM GOAL #1   Title Pt will demo understanding and indep with her HEP, to be updated throughout her POC.   Time 2   Period Weeks   Status Achieved   PT SHORT TERM GOAL #2   Title Pt will demo correct sequencing with SPC x25 ft and min cues from therapist, x3 trials.   Baseline Not using SPC   Time 4   Period Weeks   Status Unable to assess   PT SHORT TERM GOAL #3   Title Pt will demo improved safety awareness evident by her ability to ascend/descend 5 6" steps with reciprocal pattern and LRAD with  supervision only.    Time 4   Period Weeks   Status New   PT SHORT TERM GOAL #4   Title Pt will demo improved knee strength to 5/5 MMT to improve safety and stability during ambulation and stair negotiation.    Baseline hip extensor strength 2/5 MMT   Time 4   Period Weeks   Status Partially Met   PT SHORT TERM GOAL #5   Title Pt will transfer supine to/from sit each direction with correct log roll technique and with supervision only, x5 trials to improve her independence at home.    Baseline demo with cues from therapist   Time 4   Period Weeks   Status Partially Met           PT Long Term Goals - 12/21/15 1424    PT LONG TERM GOAL #1   Title Pt will demo improved LE strength to atleast 4/5 to improve her functional mobility.   Baseline except hip extension 2/5 MMT   Time 8   Period Weeks   Status Partially Met   PT LONG TERM GOAL #2   Title Pt will demo improved functional strength and power evident by a decrease in her 5x sit to stand time to under 12 sec.   Time 8   Period Weeks   Status Partially Met   PT LONG TERM GOAL #3   Title Pt will demo improved TUG time to less than 14 sec to decrease her ris kof falls at home and in the community.    Time 8   Period Weeks   Status Partially Met               Plan - 01/06/16 1511    Clinical Impression Statement Pt continues to make progress towards all goals. She was able to maintain balance during activity with minimal LOB with more dynamic activity. HEP was updated with more hip extension therex and pt was able to complete this with minimal cues for correct technique. Will continue with current POC and anticipate possible d/c in the next 1-2 visits assuming no new complications/issues arise.   Rehab Potential Good   Clinical Impairments Affecting Rehab Potential acuity of stroke   PT Frequency 3x / week   PT Duration 8 weeks   PT Treatment/Interventions ADLs/Self Care Home Management;Cryotherapy;Patient/family  education;Neuromuscular re-education;Balance training;Therapeutic exercise;Therapeutic activities;Functional mobility training;Stair training;Gait training;Manual  techniques   PT Next Visit Plan hip extensor strength   PT Home Exercise Plan no updated    Consulted and Agree with Plan of Care Patient      Patient will benefit from skilled therapeutic intervention in order to improve the following deficits and impairments:  Abnormal gait, Decreased activity tolerance, Decreased balance, Decreased endurance, Decreased coordination, Decreased safety awareness, Decreased mobility, Decreased knowledge of use of DME, Decreased strength, Difficulty walking, Dizziness, Impaired flexibility, Improper body mechanics, Postural dysfunction, Pain  Visit Diagnosis: Other symptoms and signs involving the musculoskeletal system  Other lack of coordination  Muscle weakness (generalized)  Unsteadiness on feet  Pain in left knee  History of falling  Other abnormalities of gait and mobility     Problem List Patient Active Problem List   Diagnosis Date Noted  . Cerebral infarction due to unspecified mechanism   . Knee pain, acute   . Cephalalgia   . GERD (gastroesophageal reflux disease) 06/14/2011  . FH: colon cancer 04/05/2011  . History of colon polyps 04/05/2011  . Left sided abdominal pain 04/05/2011  . N&V (nausea and vomiting) 04/05/2011    4:36 PM,01/06/2016 Elly Modena PT, DPT [facility] [place] [address] [place], [place], [address] Phone: [phone]   Fax:  [phone]  Name: Megan [name] MRN: [id number] Date of Birth: 1951/01/02

## 2016-01-10 ENCOUNTER — Encounter (HOSPITAL_COMMUNITY): Payer: Commercial Managed Care - HMO

## 2016-01-11 ENCOUNTER — Ambulatory Visit (HOSPITAL_COMMUNITY): Payer: Medicare HMO | Admitting: Physical Therapy

## 2016-01-11 DIAGNOSIS — M6281 Muscle weakness (generalized): Secondary | ICD-10-CM

## 2016-01-11 DIAGNOSIS — R2681 Unsteadiness on feet: Secondary | ICD-10-CM

## 2016-01-11 DIAGNOSIS — R2689 Other abnormalities of gait and mobility: Secondary | ICD-10-CM

## 2016-01-11 DIAGNOSIS — R29898 Other symptoms and signs involving the musculoskeletal system: Secondary | ICD-10-CM

## 2016-01-11 DIAGNOSIS — Z9181 History of falling: Secondary | ICD-10-CM

## 2016-01-11 DIAGNOSIS — R278 Other lack of coordination: Secondary | ICD-10-CM

## 2016-01-11 DIAGNOSIS — M25562 Pain in left knee: Secondary | ICD-10-CM

## 2016-01-11 NOTE — Therapy (Signed)
[facility] [address] [place], [place], [address] Phone: [phone]   Fax:  [phone]  Physical Therapy Treatment/Discharge  Patient Details  Name: Megan [name] MRN: [id number] Date of Birth: [birth year] Referring Provider: Antony Contras  Encounter Date: 01/11/2016      PT End of Session - 01/11/16 1503    Visit Number 11   Number of Visits 24   Date for PT Re-Evaluation 01/13/16   Authorization Type [employer]   Authorization Time Period 11/18/15 to 01/13/16   Authorization - Visit Number 11   Authorization - Number of Visits 24   PT Start Time 8242   PT Stop Time 1500   PT Time Calculation (min) 44 min   Equipment Utilized During Treatment Gait belt   Activity Tolerance Patient tolerated treatment well   Behavior During Therapy [facility] for tasks assessed/performed      Past Medical History  Diagnosis Date  . HTN (hypertension)   . DM (diabetes mellitus) ([place])   . Anxiety   . Depression   . Obesity   . Colon polyps   . Complication of anesthesia   . PONV (postoperative nausea and vomiting)   . Agoraphobia with panic disorder   . Chronic pain 03/08/15    Hydrocodone-APAP 10/325 mg, # 120 q month  . Stroke [facility])     Past Surgical History  Procedure Laterality Date  . Cardiac catheterization  2004    normal  . S/p hysterectomy      complete  . Abdominal hysterectomy  15 yrs ago    with bso at aph  . Cholecystectomy  25 yrs ago    [facility]  . Colonoscopy  05/04/11    left-sided colonic diverticulosis  . Esophagogastroduodenoscopy  05/04/2011    Abnormal distal esophagus, biopsies consistent with inflammation due to acid reflux. No evidence of Barrett's. No H. pylori  . Radiology with anesthesia N/A 08/20/2015    Procedure: RADIOLOGY WITH ANESTHESIA;  Surgeon: Luanne Bras, MD;  Location: [place];  Service: Radiology;  Laterality: N/A;    There were no vitals filed for this visit.      Subjective  Assessment - 01/11/16 1629    Subjective Pt feels she has made good progress so far since beginning PT. She has no complaints at this time and has been doing her HEP regularly.    Pertinent History HTN, DM, obesity, stroke 08/21/15   Limitations --  nothing    How long can you sit comfortably? unlimited   How long can you stand comfortably? 25 minutes due to her back   How long can you walk comfortably? walking 5-10 min a day   Patient Stated Goals improve strength get back to PLOF   Currently in Pain? No/denies            [facility] PT Assessment - 01/11/16 0001    Assessment   Medical Diagnosis Spastic hemiplegia non dominant side   Referring Provider Antony Contras   Onset Date/Surgical Date 08/21/15   Hand Dominance Right   Next MD Visit unsure   Prior Therapy hospital   Precautions   Precautions None   Restrictions   Weight Bearing Restrictions No   Balance Screen   Has the patient fallen in the past 6 months No   Has the patient had a decrease in activity level because of a fear of falling?  No   Is the patient reluctant to leave their home because of a fear  of falling?  No   Home Environment   Living Environment Private residence   Living Arrangements Spouse/significant other   Type of [place] to enter  7 STE with handrails   Prior Function   Level of Independence Independent   Leisure gardening    Observation/Other Assessments   Observations Pt arrived wearing L neoprene sleeve on her L knee   Focus on Therapeutic Outcomes (FOTO)  12% limited   Sensation   Light Touch Appears Intact   Posture/Postural Control   Posture/Postural Control Postural limitations   Postural Limitations Rounded Shoulders;Forward head   Strength   Right Hip Flexion 4+/5   Right Hip Extension 2/5   Right Hip ABduction 4+/5   Left Hip Flexion 4+/5   Left Hip Extension 2/5   Left Hip ABduction 4-/5   Right Knee Flexion 5/5   Right Knee Extension 5/5   Left Knee  Flexion 4+/5   Left Knee Extension 5/5   Right Ankle Dorsiflexion 5/5   Left Ankle Dorsiflexion 5/5   Palpation   Palpation comment --   Bed Mobility   Bed Mobility Rolling Right;Rolling Left;Supine to Sit   Rolling Right 6: Modified independent (Device/Increase time)   Rolling Left 6: Modified independent (Device/Increase time)   Supine to Sit 6: Modified independent (Device/Increase time)   Ambulation/Gait   Ambulation/Gait Yes   Ambulation/Gait Assistance --   Ambulation Distance (Feet) --   Assistive device --   Gait Pattern --   Gait Comments no longer using SPC   Standardized Balance Assessment   Standardized Balance Assessment Timed Up and Go Test;Five Times Sit to Stand;Berg Balance Test   Five times sit to stand comments  11.7 sec, no UE   Berg Balance Test   Sit to Stand Able to stand without using hands and stabilize independently   Standing Unsupported Able to stand safely 2 minutes   Sitting with Back Unsupported but Feet Supported on Floor or Stool Able to sit safely and securely 2 minutes   Stand to Sit Sits safely with minimal use of hands   Transfers Able to transfer safely, minor use of hands   Standing Unsupported with Eyes Closed Able to stand 10 seconds safely   Standing Ubsupported with Feet Together Able to place feet together independently and stand 1 minute safely   From Standing, Reach Forward with Outstretched Arm Can reach forward >12 cm safely (5")   From Standing Position, Pick up Object from Floor Able to pick up shoe safely and easily   From Standing Position, Turn to Look Behind Over each Shoulder Looks behind one side only/other side shows less weight shift  less to Lt   Turn 360 Degrees Able to turn 360 degrees safely in 4 seconds or less   Standing Unsupported, Alternately Place Feet on Step/Stool Able to stand independently and safely and complete 8 steps in 20 seconds   Standing Unsupported, One Foot in Front Able to plae foot ahead of the  other independently and hold 30 seconds  L: 10-15 sec, R: 20-30 sec   Standing on One Leg Able to lift leg independently and hold equal to or more than 3 seconds  ~5 RLE, ~2-3 LLE    Total Score 51   Timed Up and Go Test   TUG Comments 14.12 sec, no AD                     [facility] Adult  PT Treatment/Exercise - 01/11/16 0001    Knee/Hip Exercises: Standing   Wall Squat 10 reps   Knee/Hip Exercises: Supine   Bridges 10 reps   Knee/Hip Exercises: Sidelying   Clams x10 each with red TB                PT Education - 01/11/16 1502    Education provided Yes   Education Details discussed progress/goals/end of POC; updated/reviewed HEP; encouraged daily walking program to maintain fitness   Person(s) Educated Patient   Methods Explanation;Handout   Comprehension Verbalized understanding;Returned demonstration          PT Short Term Goals - 01/11/16 1503    PT SHORT TERM GOAL #1   Title Pt will demo understanding and indep with her HEP, to be updated throughout her POC.   Time 2   Period Weeks   Status Achieved   PT SHORT TERM GOAL #2   Title Pt will demo correct sequencing with SPC x25 ft and min cues from therapist, x3 trials.   Baseline Not using SPC   Time 4   Period Weeks   Status Deferred   PT SHORT TERM GOAL #3   Title Pt will demo improved safety awareness evident by her ability to ascend/descend 5 6" steps with reciprocal pattern and LRAD with supervision only.    Time 4   Period Weeks   Status Achieved   PT SHORT TERM GOAL #4   Title Pt will demo improved knee strength to 5/5 MMT to improve safety and stability during ambulation and stair negotiation.    Baseline hip extensor strength 2/5 MMT, all others improved to 4/5 or greater   Time 4   Period Weeks   Status Partially Met   PT SHORT TERM GOAL #5   Title Pt will transfer supine to/from sit each direction with correct log roll technique and with supervision only, x5 trials to improve her  independence at home.    Baseline demo with cues from therapist   Time 4   Period Weeks   Status Achieved           PT Long Term Goals - 01/11/16 1505    PT LONG TERM GOAL #1   Title Pt will demo improved LE strength to atleast 4/5 to improve her functional mobility.   Baseline except hip extension 2/5 MMT   Time 8   Period Weeks   Status Partially Met   PT LONG TERM GOAL #2   Title Pt will demo improved functional strength and power evident by a decrease in her 5x sit to stand time to under 12 sec.   Time 8   Period Weeks   Status Achieved   PT LONG TERM GOAL #3   Title Pt will demo improved TUG time to less than 14 sec to decrease her ris kof falls at home and in the community.    Time 8   Period Weeks   Status Achieved               Plan - 01/11/16 1508    Clinical Impression Statement Mrs. Claudetta has made great progress since starting PT. She demonstrates improved BLE strength up to atleast 4/5 MMT except hip extensor strength which is partially due to limited hip flexibility in testing position. She demonstrates significant improvements in functional testing (5x sit to stand, TUG), performing at age appropriate levels in both. She scored a 51/56 on the Berg balance test, placing her at  the higher end of moderate risk for falls and her balance will continue to improve with performance of HEP provided and reviewed at the end of today's session. At this time, Mrs. [place] feels comfortable performing her HEP at home and no longer requires skilled PT to continue to make improvements in function/strength. She is being discharged from PT secondary to independence with her HEP and being pleased with current functional level.   Rehab Potential Good   Clinical Impairments Affecting Rehab Potential acuity of stroke   PT Frequency 3x / week   PT Duration 8 weeks   PT Treatment/Interventions ADLs/Self Care Home Management;Cryotherapy;Patient/family education;Neuromuscular  re-education;Balance training;Therapeutic exercise;Therapeutic activities;Functional mobility training;Stair training;Gait training;Manual techniques   PT Next Visit Plan d/c this visit   PT Home Exercise Plan bridge, clamshells, wall squats, tandem/SLS holds, walking daily   Recommended Other Services None at this time   Consulted and Agree with Plan of Care Patient      Patient will benefit from skilled therapeutic intervention in order to improve the following deficits and impairments:  Abnormal gait, Decreased activity tolerance, Decreased balance, Decreased endurance, Decreased coordination, Decreased safety awareness, Decreased mobility, Decreased knowledge of use of DME, Decreased strength, Difficulty walking, Dizziness, Impaired flexibility, Improper body mechanics, Postural dysfunction, Pain  Visit Diagnosis: Other symptoms and signs involving the musculoskeletal system  Other lack of coordination  Muscle weakness (generalized)  Unsteadiness on feet  History of falling  Pain in left knee  Other abnormalities of gait and mobility       G-Codes - Feb 04, 2016 1517    Functional Assessment Tool Used clinical judgment based on assessment of strenght, mobility and balance   Functional Limitation Mobility: Walking and moving around   Mobility: Walking and Moving Around Goal Status [phone]) At least 40 percent but less than 60 percent impaired, limited or restricted   Mobility: Walking and Moving Around Discharge Status [phone]) At least 20 percent but less than 40 percent impaired, limited or restricted      Problem List Patient Active Problem List   Diagnosis Date Noted  . Cerebral infarction due to unspecified mechanism   . Knee pain, acute   . Cephalalgia   . GERD (gastroesophageal reflux disease) 06/14/2011  . FH: colon cancer 04/05/2011  . History of colon polyps 04/05/2011  . Left sided abdominal pain 04/05/2011  . N&V (nausea and vomiting) 04/05/2011    4:38  PM,2016/02/04 Elly Modena PT, DPT [facility] [address] [place], [place], [address] Phone: [phone]   Fax:  [phone]  Name: Megan [name] MRN: [id number] Date of Birth: 15-May-1951  PHYSICAL THERAPY DISCHARGE SUMMARY  Visits from Start of Care: 11  Current functional level related to goals / functional outcomes: No reported falls. Improved BLE strength, 5x sit to stand and TUG performance. Berg balance 51/56. See clinical impression above for more details.    Remaining deficits: SLS ~5 sec each, hip extensor strength which will continue to improve with HEP adherence at home   Education / Equipment: Advanced home management program Plan: Patient agrees to discharge.  Patient goals were partially met. Patient is being discharged due to being pleased with the current functional level.  ?????        4:38 PM,Feb 04, 2016 Elly Modena PT, [facility] [phone]

## 2016-01-13 ENCOUNTER — Ambulatory Visit (HOSPITAL_COMMUNITY): Payer: Commercial Managed Care - HMO | Admitting: Physical Therapy

## 2016-01-17 ENCOUNTER — Encounter (HOSPITAL_COMMUNITY): Payer: Commercial Managed Care - HMO

## 2016-01-20 ENCOUNTER — Encounter (HOSPITAL_COMMUNITY): Payer: Self-pay

## 2016-01-20 ENCOUNTER — Emergency Department (HOSPITAL_COMMUNITY): Payer: Medicare HMO

## 2016-01-20 ENCOUNTER — Inpatient Hospital Stay (HOSPITAL_COMMUNITY)
Admission: EM | Admit: 2016-01-20 | Discharge: 2016-01-24 | DRG: 377 | Disposition: A | Discharge status: Home / Self Care | Payer: Medicare HMO | Attending: Internal Medicine | Admitting: Internal Medicine

## 2016-01-20 DIAGNOSIS — Z79899 Other long term (current) drug therapy: Secondary | ICD-10-CM | POA: Diagnosis not present

## 2016-01-20 DIAGNOSIS — Z87891 Personal history of nicotine dependence: Secondary | ICD-10-CM

## 2016-01-20 DIAGNOSIS — F329 Major depressive disorder, single episode, unspecified: Secondary | ICD-10-CM | POA: Diagnosis not present

## 2016-01-20 DIAGNOSIS — Z8673 Personal history of transient ischemic attack (TIA), and cerebral infarction without residual deficits: Secondary | ICD-10-CM

## 2016-01-20 DIAGNOSIS — D500 Iron deficiency anemia secondary to blood loss (chronic): Secondary | ICD-10-CM | POA: Diagnosis present

## 2016-01-20 DIAGNOSIS — Z7984 Long term (current) use of oral hypoglycemic drugs: Secondary | ICD-10-CM

## 2016-01-20 DIAGNOSIS — Z7901 Long term (current) use of anticoagulants: Secondary | ICD-10-CM | POA: Diagnosis not present

## 2016-01-20 DIAGNOSIS — E11649 Type 2 diabetes mellitus with hypoglycemia without coma: Secondary | ICD-10-CM | POA: Diagnosis present

## 2016-01-20 DIAGNOSIS — K921 Melena: Secondary | ICD-10-CM | POA: Diagnosis not present

## 2016-01-20 DIAGNOSIS — K922 Gastrointestinal hemorrhage, unspecified: Secondary | ICD-10-CM | POA: Diagnosis present

## 2016-01-20 DIAGNOSIS — F419 Anxiety disorder, unspecified: Secondary | ICD-10-CM | POA: Diagnosis present

## 2016-01-20 DIAGNOSIS — K625 Hemorrhage of anus and rectum: Secondary | ICD-10-CM | POA: Diagnosis not present

## 2016-01-20 DIAGNOSIS — E669 Obesity, unspecified: Secondary | ICD-10-CM | POA: Diagnosis present

## 2016-01-20 DIAGNOSIS — R531 Weakness: Secondary | ICD-10-CM | POA: Diagnosis present

## 2016-01-20 DIAGNOSIS — F32A Depression, unspecified: Secondary | ICD-10-CM | POA: Diagnosis present

## 2016-01-20 DIAGNOSIS — I10 Essential (primary) hypertension: Secondary | ICD-10-CM | POA: Diagnosis present

## 2016-01-20 DIAGNOSIS — F418 Other specified anxiety disorders: Secondary | ICD-10-CM | POA: Diagnosis not present

## 2016-01-20 DIAGNOSIS — Z6836 Body mass index (BMI) 36.0-36.9, adult: Secondary | ICD-10-CM | POA: Diagnosis not present

## 2016-01-20 DIAGNOSIS — R680 Hypothermia, not associated with low environmental temperature: Secondary | ICD-10-CM | POA: Diagnosis not present

## 2016-01-20 DIAGNOSIS — D62 Acute posthemorrhagic anemia: Secondary | ICD-10-CM | POA: Diagnosis present

## 2016-01-20 DIAGNOSIS — R571 Hypovolemic shock: Secondary | ICD-10-CM | POA: Diagnosis not present

## 2016-01-20 DIAGNOSIS — Z79891 Long term (current) use of opiate analgesic: Secondary | ICD-10-CM

## 2016-01-20 DIAGNOSIS — G894 Chronic pain syndrome: Secondary | ICD-10-CM | POA: Diagnosis present

## 2016-01-20 DIAGNOSIS — K254 Chronic or unspecified gastric ulcer with hemorrhage: Secondary | ICD-10-CM | POA: Diagnosis present

## 2016-01-20 DIAGNOSIS — K259 Gastric ulcer, unspecified as acute or chronic, without hemorrhage or perforation: Secondary | ICD-10-CM | POA: Diagnosis not present

## 2016-01-20 DIAGNOSIS — G8929 Other chronic pain: Secondary | ICD-10-CM | POA: Diagnosis present

## 2016-01-20 DIAGNOSIS — E119 Type 2 diabetes mellitus without complications: Secondary | ICD-10-CM

## 2016-01-20 DIAGNOSIS — I4891 Unspecified atrial fibrillation: Secondary | ICD-10-CM | POA: Diagnosis present

## 2016-01-20 DIAGNOSIS — I959 Hypotension, unspecified: Secondary | ICD-10-CM | POA: Diagnosis not present

## 2016-01-20 DIAGNOSIS — D649 Anemia, unspecified: Secondary | ICD-10-CM

## 2016-01-20 DIAGNOSIS — Z23 Encounter for immunization: Secondary | ICD-10-CM | POA: Diagnosis not present

## 2016-01-20 DIAGNOSIS — T68XXXA Hypothermia, initial encounter: Secondary | ICD-10-CM

## 2016-01-20 DIAGNOSIS — E162 Hypoglycemia, unspecified: Secondary | ICD-10-CM | POA: Diagnosis present

## 2016-01-20 HISTORY — DX: Gastrointestinal hemorrhage, unspecified: K92.2

## 2016-01-20 LAB — COMPREHENSIVE METABOLIC PANEL
ALK PHOS: 73 U/L (ref 38–126)
ALT: 10 U/L — AB (ref 14–54)
AST: 15 U/L (ref 15–41)
Albumin: 3.3 g/dL — ABNORMAL LOW (ref 3.5–5.0)
Anion gap: 7 (ref 5–15)
BILIRUBIN TOTAL: 0.9 mg/dL (ref 0.3–1.2)
BUN: 34 mg/dL — ABNORMAL HIGH (ref 6–20)
CALCIUM: 8.7 mg/dL — AB (ref 8.9–10.3)
CO2: 26 mmol/L (ref 22–32)
Chloride: 102 mmol/L (ref 101–111)
Creatinine, Ser: 1.46 mg/dL — ABNORMAL HIGH (ref 0.44–1.00)
GFR calc Af Amer: 42 mL/min — ABNORMAL LOW (ref >60)
GFR, EST NON AFRICAN AMERICAN: 37 mL/min — AB (ref >60)
GLUCOSE: 94 mg/dL (ref 65–99)
Potassium: 4.6 mmol/L (ref 3.5–5.1)
Sodium: 135 mmol/L (ref 135–145)
TOTAL PROTEIN: 6.7 g/dL (ref 6.5–8.1)

## 2016-01-20 LAB — CBC WITH DIFFERENTIAL/PLATELET
BASOS ABS: 0 10*3/uL (ref 0.0–0.1)
Basophils Relative: 0 %
Eosinophils Absolute: 0.1 10*3/uL (ref 0.0–0.7)
Eosinophils Relative: 1 %
HEMATOCRIT: 22 % — AB (ref 36.0–46.0)
HEMOGLOBIN: 7.2 g/dL — AB (ref 12.0–15.0)
LYMPHS PCT: 16 %
Lymphs Abs: 1 10*3/uL (ref 0.7–4.0)
MCH: 32.4 pg (ref 26.0–34.0)
MCHC: 32.7 g/dL (ref 30.0–36.0)
MCV: 99.1 fL (ref 78.0–100.0)
Monocytes Absolute: 0.3 10*3/uL (ref 0.1–1.0)
Monocytes Relative: 4 %
NEUTROS ABS: 5.2 10*3/uL (ref 1.7–7.7)
NEUTROS PCT: 79 %
PLATELETS: 237 10*3/uL (ref 150–400)
RBC: 2.22 MIL/uL — AB (ref 3.87–5.11)
RDW: 15.5 % (ref 11.5–15.5)
WBC: 6.5 10*3/uL (ref 4.0–10.5)

## 2016-01-20 LAB — URINALYSIS, ROUTINE W REFLEX MICROSCOPIC
Bilirubin Urine: NEGATIVE
GLUCOSE, UA: NEGATIVE mg/dL
HGB URINE DIPSTICK: NEGATIVE
LEUKOCYTES UA: NEGATIVE
Nitrite: NEGATIVE
PROTEIN: NEGATIVE mg/dL
Specific Gravity, Urine: >1.03 — ABNORMAL HIGH (ref 1.005–1.030)
pH: 5 (ref 5.0–8.0)

## 2016-01-20 LAB — RAPID URINE DRUG SCREEN, HOSP PERFORMED
Amphetamines: NOT DETECTED
BARBITURATES: POSITIVE — AB
Benzodiazepines: POSITIVE — AB
COCAINE: NOT DETECTED
Opiates: POSITIVE — AB
Tetrahydrocannabinol: NOT DETECTED

## 2016-01-20 LAB — POC OCCULT BLOOD, ED: Fecal Occult Bld: POSITIVE — AB

## 2016-01-20 LAB — TROPONIN I: Troponin I: <0.03 ng/mL (ref <0.03)

## 2016-01-20 LAB — PROTIME-INR
INR: 1.49 (ref 0.00–1.49)
PROTHROMBIN TIME: 18.1 s — AB (ref 11.6–15.2)

## 2016-01-20 LAB — CBG MONITORING, ED
GLUCOSE-CAPILLARY: 73 mg/dL (ref 65–99)
Glucose-Capillary: 135 mg/dL — ABNORMAL HIGH (ref 65–99)
Glucose-Capillary: 84 mg/dL (ref 65–99)

## 2016-01-20 LAB — ETHANOL

## 2016-01-20 LAB — PREPARE RBC (CROSSMATCH)

## 2016-01-20 LAB — I-STAT CG4 LACTIC ACID, ED: LACTIC ACID, VENOUS: 1.46 mmol/L (ref 0.5–1.9)

## 2016-01-20 LAB — ABO/RH: ABO/RH(D): O POS

## 2016-01-20 LAB — APTT: APTT: 40 s — AB (ref 24–37)

## 2016-01-20 MED ORDER — ACETAMINOPHEN 650 MG RE SUPP: 650.0000 mg | Freq: Four times a day (QID) | RECTAL | Status: DC | PRN

## 2016-01-20 MED ORDER — SODIUM CHLORIDE 0.9 % IV SOLN
80.0000 mg | Freq: Once | INTRAVENOUS | Status: AC
  Administered 2016-01-20: 80 mg via INTRAVENOUS
  Filled 2016-01-20: qty 80

## 2016-01-20 MED ORDER — SODIUM CHLORIDE 0.9 % IV SOLN
8.0000 mg/h | INTRAVENOUS | Status: DC
  Administered 2016-01-21 – 2016-01-22 (×4): 8 mg/h via INTRAVENOUS
  Filled 2016-01-20 (×8): qty 80

## 2016-01-20 MED ORDER — SODIUM CHLORIDE 0.9 % IV SOLN
8.0000 mg/h | INTRAVENOUS | Status: DC
  Administered 2016-01-20 (×2): 8 mg/h via INTRAVENOUS
  Filled 2016-01-20 (×2): qty 80

## 2016-01-20 MED ORDER — SODIUM CHLORIDE 0.9% FLUSH
3.0000 mL | Freq: Two times a day (BID) | INTRAVENOUS | Status: DC
  Administered 2016-01-21 – 2016-01-24 (×5): 3 mL via INTRAVENOUS

## 2016-01-20 MED ORDER — SODIUM CHLORIDE 0.9 % IV BOLUS (SEPSIS): 1000.0000 mL | Freq: Once | INTRAVENOUS | Status: DC

## 2016-01-20 MED ORDER — VANCOMYCIN HCL IN DEXTROSE 1-5 GM/200ML-% IV SOLN: 1000.0000 mg | Freq: Once | INTRAVENOUS | Status: DC

## 2016-01-20 MED ORDER — LACTATED RINGERS IV SOLN: INTRAVENOUS | Status: DC

## 2016-01-20 MED ORDER — ACETAMINOPHEN 325 MG PO TABS
650.0000 mg | ORAL_TABLET | Freq: Four times a day (QID) | ORAL | Status: DC | PRN
  Administered 2016-01-21: 650 mg via ORAL
  Filled 2016-01-20: qty 2

## 2016-01-20 MED ORDER — PANTOPRAZOLE SODIUM 40 MG IV SOLR
INTRAVENOUS | Status: AC
  Filled 2016-01-20: qty 40

## 2016-01-20 MED ORDER — MORPHINE SULFATE (PF) 2 MG/ML IV SOLN: 2.0000 mg | INTRAVENOUS | Status: DC | PRN

## 2016-01-20 MED ORDER — DEXTROSE 10 % IV SOLN
Freq: Once | INTRAVENOUS | Status: AC
  Administered 2016-01-20: 21:00:00 via INTRAVENOUS

## 2016-01-20 MED ORDER — SODIUM CHLORIDE 0.9 % IV BOLUS (SEPSIS)
1000.0000 mL | Freq: Once | INTRAVENOUS | Status: DC
  Administered 2016-01-20: 1000 mL via INTRAVENOUS

## 2016-01-20 MED ORDER — PIPERACILLIN-TAZOBACTAM 3.375 G IVPB: 3.3750 g | Freq: Three times a day (TID) | INTRAVENOUS | Status: DC

## 2016-01-20 MED ORDER — VANCOMYCIN HCL 10 G IV SOLR: 1250.0000 mg | INTRAVENOUS | Status: DC

## 2016-01-20 MED ORDER — VANCOMYCIN HCL 10 G IV SOLR
1750.0000 mg | Freq: Once | INTRAVENOUS | Status: DC
  Filled 2016-01-20: qty 1750

## 2016-01-20 MED ORDER — ONDANSETRON HCL 4 MG PO TABS: 4.0000 mg | ORAL_TABLET | Freq: Four times a day (QID) | ORAL | Status: DC | PRN

## 2016-01-20 MED ORDER — LEVOTHYROXINE SODIUM 25 MCG PO TABS
25.0000 ug | ORAL_TABLET | Freq: Every day | ORAL | Status: DC
  Administered 2016-01-21 – 2016-01-24 (×3): 25 ug via ORAL
  Filled 2016-01-20 (×3): qty 1

## 2016-01-20 MED ORDER — PNEUMOCOCCAL VAC POLYVALENT 25 MCG/0.5ML IJ INJ
0.5000 mL | INJECTION | INTRAMUSCULAR | Status: AC
  Administered 2016-01-21: 0.5 mL via INTRAMUSCULAR
  Filled 2016-01-20: qty 0.5

## 2016-01-20 MED ORDER — PIPERACILLIN-TAZOBACTAM 3.375 G IVPB 30 MIN
3.3750 g | Freq: Once | INTRAVENOUS | Status: AC
  Administered 2016-01-20: 3.375 g via INTRAVENOUS
  Filled 2016-01-20: qty 50

## 2016-01-20 MED ORDER — PANTOPRAZOLE SODIUM 40 MG IV SOLR
INTRAVENOUS | Status: AC
  Filled 2016-01-20: qty 80

## 2016-01-20 MED ORDER — ATORVASTATIN CALCIUM 20 MG PO TABS
20.0000 mg | ORAL_TABLET | Freq: Every day | ORAL | Status: DC
  Administered 2016-01-21 – 2016-01-23 (×3): 20 mg via ORAL
  Filled 2016-01-20 (×3): qty 1

## 2016-01-20 MED ORDER — PANTOPRAZOLE SODIUM 40 MG IV SOLR: 40.0000 mg | Freq: Two times a day (BID) | INTRAVENOUS | Status: DC

## 2016-01-20 MED ORDER — ONDANSETRON HCL 4 MG/2ML IJ SOLN: 4.0000 mg | Freq: Four times a day (QID) | INTRAMUSCULAR | Status: DC | PRN

## 2016-01-20 MED ORDER — INSULIN ASPART 100 UNIT/ML ~~LOC~~ SOLN
0.0000 [IU] | SUBCUTANEOUS | Status: DC
  Administered 2016-01-22: 3 [IU] via SUBCUTANEOUS
  Administered 2016-01-22 (×2): 2 [IU] via SUBCUTANEOUS

## 2016-01-20 MED ORDER — SODIUM CHLORIDE 0.9 % IV SOLN: 10.0000 mL/h | Freq: Once | INTRAVENOUS | Status: DC

## 2016-01-20 MED ORDER — METOPROLOL SUCCINATE ER 50 MG PO TB24
50.0000 mg | ORAL_TABLET | Freq: Every day | ORAL | Status: DC
  Administered 2016-01-21 – 2016-01-23 (×3): 50 mg via ORAL
  Filled 2016-01-20 (×3): qty 1

## 2016-01-20 NOTE — H&P (Signed)
History and Physical    [place] A [name] WUJ:[id number] DOB: 08/21/1950 DOA: 01/20/2016  PCP: Milana ObeyStephen D Knowlton, MD Consultants:  None Patient coming from: home  Chief Complaint: weakness  HPI: [place] Verner [name] is a 65 y.o. female with medical history significant of HTN, DM, CVA, anxiety, depression, and chronic pain presenting with an episode of unresponsiveness.  Patient erports that she has been feeling weak all week.  Reports that her legs were giving out on her and she couldn't get around or up and down, didn't have any strength.  Tried to do some stuff this AM but too tired and weak.  Laid down in recliner and went to sleep.  Family found her, couldn't get her to wake up.  No h'o similar episiodes.  Dark tarry stools today and yesterday, very black.  No n/v, no abdominal pain.  Facial pain from broken bones, fell out of truck about 3 weeks ago.  Has been taking hydrocodone for pain.  Tingling in hands since stroke in Feb 2017.    ED Course:  Patient reportedly hypothermic upon arrival so a baer hugger was placed; temps by ER nurse all WNL.  Glucose 27, treated.  Initially called code sepsis but nothing in work-up to indicate sepsis.  Normal lactate.  Hypotension improved with IVF.  Noted to be anemic, guaiac positive stools, started on Protonix drip and transfused 2 units prbc.  Review of Systems: As per HPI; otherwise 10 point review of systems reviewed and negative.   Ambulatory Status: walks with a cane but unable to get up and down without heklpo this week  Past Medical History  Diagnosis Date  . HTN (hypertension)   . DM (diabetes mellitus) (HCC)   . Anxiety   . Depression   . Obesity   . Colon polyps   . Complication of anesthesia   . PONV (postoperative nausea and vomiting)   . Agoraphobia with panic disorder   . Chronic pain 03/08/15    Hydrocodone-APAP 10/325 mg, # 120 q month  . Stroke [facility](HCC)     Past Surgical History  Procedure Laterality Date  . Cardiac  catheterization  2004    normal  . Abdominal hysterectomy  15 yrs ago    with bso at aph  . Cholecystectomy  25 yrs ago    [facility]  . Colonoscopy  05/04/11    left-sided colonic diverticulosis  . Esophagogastroduodenoscopy  05/04/2011    Abnormal distal esophagus, biopsies consistent with inflammation due to acid reflux. No evidence of Barrett's. No H. pylori  . Radiology with anesthesia N/A 08/20/2015    Procedure: RADIOLOGY WITH ANESTHESIA;  Surgeon: Julieanne CottonSanjeev Deveshwar, MD;  Location: [facility] OR;  Service: Radiology;  Laterality: N/A;    Social History   Social History  . Marital Status: Married    Spouse Name: N/A  . Number of Children: 3  . Years of Education: N/A   Occupational History  . disability    Social History Main Topics  . Smoking status: Former Smoker -- 0.25 packs/day for 15 years    Types: Cigarettes    Start date: 07/17/1969    Quit date: 07/18/1971  . Smokeless tobacco: Current User    Types: Snuff     Comment: never more than 1-2 cig/day in entire life  . Alcohol Use: No  . Drug Use: No  . Sexual Activity: Yes    Birth Control/ Protection: Surgical   Other Topics Concern  . Not on file   Social History  Narrative   No contact with children since they left home at age 94. Doesn't know where they are.    Allergies  Allergen Reactions  . Naproxen Other (See Comments)    Causes stomach burning    Family History  Problem Relation Age of Onset  . Colon cancer Paternal Aunt     2, colon cancer  . Colon cancer Paternal Uncle     6, colon cancer  . Colon cancer Father     deceased, age 4  . Colon cancer      numerous cousins died before age 12 with colon cancer  . Colon cancer Sister     deceased, age 23  . Anesthesia problems Neg Hx   . Hypotension Neg Hx   . Malignant hyperthermia Neg Hx   . Pseudochol deficiency Neg Hx     Prior to Admission medications   Medication Sig Start Date End Date Taking? Authorizing Provider  apixaban (ELIQUIS) 5 MG  TABS tablet Take 1 tablet (5 mg total) by mouth 2 (two) times daily. 09/01/15  Yes Layne Benton, NP  atorvastatin (LIPITOR) 20 MG tablet Take 20 mg by mouth at bedtime.    Yes Historical Provider, MD  Cholecalciferol (VITAMIN D-3 PO) Take 1 tablet by mouth daily.   Yes Historical Provider, MD  enalapril (VASOTEC) 20 MG tablet Take 20 mg by mouth 2 (two) times daily.    Yes Historical Provider, MD  glipiZIDE (GLUCOTROL) 10 MG tablet Take 10 mg by mouth every morning. In the AM   Yes Historical Provider, MD  hydrochlorothiazide (HYDRODIURIL) 25 MG tablet Take 25 mg by mouth daily.     Yes Historical Provider, MD  HYDROcodone-acetaminophen (NORCO) 10-325 MG tablet Take 1 tablet by mouth every 6 (six) hours as needed for moderate pain. 12/24/15  Yes Lonia Skinner Sofia, PA-C  levothyroxine (SYNTHROID, LEVOTHROID) 25 MCG tablet Take 25 mcg by mouth daily. 07/14/15  Yes Historical Provider, MD  metFORMIN (GLUCOPHAGE) 500 MG tablet Take 1,000 mg by mouth 2 (two) times daily with a meal.    Yes Historical Provider, MD  metoprolol (TOPROL-XL) 50 MG 24 hr tablet Take 50 mg by mouth daily with lunch.    Yes Historical Provider, MD  pioglitazone (ACTOS) 15 MG tablet Take 15 mg by mouth daily.   Yes Historical Provider, MD    Physical Exam: Filed Vitals:   01/20/16 2030 01/20/16 2100 01/20/16 2130 01/20/16 2200  BP: 122/76 100/60 111/61 86/51  Pulse: 59 61 59 59  Temp:      TempSrc:      Resp: Height:      Weight:      SpO2: 100% 99% 98% 100%     General: Appears calm and comfortable but somewhat somnolent and is NAD; once she effectively woke up, she was quite conversant and appropriate Eyes:  PERRL, EOMI, normal lids, iris ENT:  grossly normal hearing, lips & tongue, mmm Neck:  no LAD, masses or thyromegaly Cardiovascular:  RRR, no m/r/g. No LE edema.  Respiratory:  CTA bilaterally, no w/r/r. Normal respiratory effort. Abdomen:  soft, ntnd, NABS Skin: no rash or induration seen on  limited exam; patient does have healing ecchymoses along left maxilla Musculoskeletal:  grossly normal tone BUE/BLE, good ROM, no bony abnormality Psychiatric:  grossly normal mood and affect, speech fluent and appropriate, AOx3 Neurologic:  CN 2-12 grossly intact, moves all extremities in coordinated fashion, sensation intact  Labs on Admission: I have  personally reviewed following labs and imaging studies  CBC:  Recent Labs Lab 01/20/16 1925  WBC 6.5  NEUTROABS 5.2  HGB 7.2*  HCT 22.0*  MCV 99.1  PLT 237   Basic Metabolic Panel:  Recent Labs Lab 01/20/16 1925  NA 135  K 4.6  CL 102  CO2 26  GLUCOSE 94  BUN 34*  CREATININE 1.46*  CALCIUM 8.7*   GFR: Estimated Creatinine Clearance: 40.2 mL/min (by C-G formula based on Cr of 1.46). Liver Function Tests:  Recent Labs Lab 01/20/16 1925  AST 15  ALT 10*  ALKPHOS 73  BILITOT 0.9  PROT 6.7  ALBUMIN 3.3*   No results for input(s): LIPASE, AMYLASE in the last 168 hours. No results for input(s): AMMONIA in the last 168 hours. Coagulation Profile:  Recent Labs Lab 01/20/16 1925  INR 1.49   Cardiac Enzymes:  Recent Labs Lab 01/20/16 1925  TROPONINI <0.03   BNP (last 3 results) No results for input(s): PROBNP in the last 8760 hours. HbA1C: No results for input(s): HGBA1C in the last 72 hours. CBG:  Recent Labs Lab 01/20/16 1824 01/20/16 1932 01/20/16 2206  GLUCAP 135* 84 73   Lipid Profile: No results for input(s): CHOL, HDL, LDLCALC, TRIG, CHOLHDL, LDLDIRECT in the last 72 hours. Thyroid Function Tests: No results for input(s): TSH, T4TOTAL, FREET4, T3FREE, THYROIDAB in the last 72 hours. Anemia Panel: No results for input(s): VITAMINB12, FOLATE, FERRITIN, TIBC, IRON, RETICCTPCT in the last 72 hours. Urine analysis:    Component Value Date/Time   COLORURINE YELLOW 01/20/2016 1850   APPEARANCEUR CLEAR 01/20/2016 1850   LABSPEC >1.030* 01/20/2016 1850   PHURINE 5.0 01/20/2016 1850   GLUCOSEU  NEGATIVE 01/20/2016 1850   HGBUR NEGATIVE 01/20/2016 1850   BILIRUBINUR NEGATIVE 01/20/2016 1850   KETONESUR TRACE* 01/20/2016 1850   PROTEINUR NEGATIVE 01/20/2016 1850   UROBILINOGEN 0.2 03/08/2015 0740   NITRITE NEGATIVE 01/20/2016 1850   LEUKOCYTESUR NEGATIVE 01/20/2016 1850    Creatinine Clearance: Estimated Creatinine Clearance: 40.2 mL/min (by C-G formula based on Cr of 1.46).  Sepsis Labs: @LABRCNTIP (procalcitonin:4,lacticidven:4) ) Recent Results (from the past 240 hour(s))  Culture, blood (Routine X 2) w Reflex to ID Panel     Status: None (Preliminary result)   Collection Time: 01/20/16  7:25 PM  Result Value Ref Range Status   Specimen Description BLOOD RIGHT HAND  Final   Special Requests BOTTLES DRAWN AEROBIC AND ANAEROBIC 6CC  Final   Culture PENDING  Incomplete   Report Status PENDING  Incomplete  Culture, blood (Routine X 2) w Reflex to ID Panel     Status: None (Preliminary result)   Collection Time: 01/20/16  7:34 PM  Result Value Ref Range Status   Specimen Description BLOOD LEFT ARM  Final   Special Requests BOTTLES DRAWN AEROBIC AND ANAEROBIC 6CC  Final   Culture PENDING  Incomplete   Report Status PENDING  Incomplete     Radiological Exams on Admission: Dg Chest Port 1 View  01/20/2016  CLINICAL DATA:  Sepsis EXAM: PORTABLE CHEST 1 VIEW COMPARISON:  12/13/2009 FINDINGS: The heart size and mediastinal contours are within normal limits. Both lungs are clear. The visualized skeletal structures are unremarkable. IMPRESSION: No active disease. Electronically Signed   By: Elige KoHetal  Patel   On: 01/20/2016 19:50    EKG: Independently reviewed.  NSR with rate 64;  no evidence of acute ischemia  Assessment/Plan Principal Problem:   Upper GI bleeding Active Problems:   Hypoglycemia   H/O: CVA (  cerebrovascular accident)   Chronic pain   DM type 2 (diabetes mellitus, type 2) (HCC)   Benign essential HTN   Anxiety and depression   Blood loss anemia    Upper GI  bleeding -Patient with progressive weakness over this week, found unarousable by family today -Reports h/o dark tarry stools x 2 days -On Eliquis so increased bleeding risk -Hypotensive, hypoglycemic on presentation so initial concern for sepsis but GI bleed appears to be cause instead -Transfused 2 units PRBC by ER doctor -Protonix drip started by ER doctor, will continue -GI consult called by ER doctor -Will admit to SDU for close monitoring overnight -Hold Eliquis, will need to discuss whether to resume for afib with CVA in Feb prior to discharge  Hypoglycemia/DM -Has reported h/o DM -Hold Glucotrol, Glucophage, and Actos -Cover with SSI if she becomes hyperglycemic  Anemia -Acute blood loss anemia -Hgb in 2/17 was 10.3, currently 7,2 -Transfusing 2 units PRBC as per ER physician -Will order post-transfusion CBC and then follow q6h  H/o CVA -Continue Lipitor  Chronic pain -I have reviewed this patient in the [place] Controlled Substances Reporting System.  She is receiving his medications from only one provider (other than a 1-time rx  For Fiorcet recently for headaches) and appears to be taking them as prescribed. -Hold oral pain meds, will give IV morphine prn severe pain  HTN -Hold ACE-I (elevated creatinine) and HCTZ (bleeding) -Continue beta blocker despite marginal BP   DVT prophylaxis: SCDs (active bleed) Code Status: Full - confirmed with patient Family Communication: Not present at time of evaluation Disposition Plan: Home once clinically improved Consults called: GI Admission status: Admit to SDU   Jonah Blue MD [facility]  If 7PM-7AM, please contact night-coverage [email or web address] Password [email or web address]  01/20/2016, 10:25 PM

## 2016-01-20 NOTE — ED Notes (Signed)
Per EMS, initial CBG was 27. Pt was given an amp of D50 prior to arrival. Repeat CBG 242. Pt states she has not been eating well over the past couple of days but denies nausea vomiting or diarrhea

## 2016-01-20 NOTE — ED Provider Notes (Signed)
CSN: [id number]     Arrival date & time 01/20/16  1818 History   First MD Initiated Contact with Patient 01/20/16 1822     Chief Complaint  Patient presents with  . Hypoglycemia     (Consider location/radiation/quality/duration/timing/severity/associated sxs/prior Treatment) HPI patient presents with altered mental status. Family found her unresponsive. She was sitting in a chair. States she remembers family trying to talk to her but not being able to move. EMS was called and she was found to have CBG of 27. Given amp of D50 with CBG improving 242. Patient states she has not been eating well for the last few days. She's had increased generalized weakness and fatigue. States she had a dark bowel movement earlier today. Denies any abdominal pain. No nausea, vomiting or diarrhea. She does admit to increased urinary frequency but denies dysuria. No recent changes in her medication.  Past Medical History  Diagnosis Date  . HTN (hypertension)   . DM (diabetes mellitus) (HCC)   . Anxiety   . Depression   . Obesity   . Colon polyps   . Complication of anesthesia   . PONV (postoperative nausea and vomiting)   . Agoraphobia with panic disorder   . Chronic pain 03/08/15    Hydrocodone-APAP 10/325 mg, # 120 q month  . Stroke [facility](HCC)    Past Surgical History  Procedure Laterality Date  . Cardiac catheterization  2004    normal  . S/p hysterectomy      complete  . Abdominal hysterectomy  15 yrs ago    with bso at aph  . Cholecystectomy  25 yrs ago    [facility]  . Colonoscopy  05/04/11    left-sided colonic diverticulosis  . Esophagogastroduodenoscopy  05/04/2011    Abnormal distal esophagus, biopsies consistent with inflammation due to acid reflux. No evidence of Barrett's. No H. pylori  . Radiology with anesthesia N/A 08/20/2015    Procedure: RADIOLOGY WITH ANESTHESIA;  Surgeon: Julieanne CottonSanjeev Deveshwar, MD;  [facility];  Service: Radiology;  Laterality: N/A;   Family History  Problem Relation Age  of Onset  . Colon cancer Paternal Aunt     2, colon cancer  . Colon cancer Paternal Uncle     6, colon cancer  . Colon cancer Father     deceased, age 66  . Colon cancer      numerous cousins died before age 66 with colon cancer  . Colon cancer Sister     deceased, age 66  . Anesthesia problems Neg Hx   . Hypotension Neg Hx   . Malignant hyperthermia Neg Hx   . Pseudochol deficiency Neg Hx    Social History  Substance Use Topics  . Smoking status: Former Smoker -- 0.25 packs/day for 15 years    Types: Cigarettes    Start date: 07/17/1969    Quit date: 07/18/1971  . Smokeless tobacco: Current User    Types: Snuff     Comment: one pack per month, never more than 1-2 cig/day in entire life  . Alcohol Use: No   OB History    No data available     Review of Systems  Constitutional: Positive for activity change, appetite change and fatigue. Negative for fever and chills.  Respiratory: Negative for cough and shortness of breath.   Cardiovascular: Negative for chest pain.  Gastrointestinal: Negative for nausea, vomiting, abdominal pain and diarrhea.  Genitourinary: Positive for frequency. Negative for dysuria and difficulty urinating.  Musculoskeletal: Negative for back pain,  neck pain and neck stiffness.  Skin: Negative for rash and wound.  Neurological: Positive for tremors, syncope and weakness (generalized). Negative for dizziness, light-headedness, numbness and headaches.  All other systems reviewed and are negative.     Allergies  Naproxen  Home Medications   Prior to Admission medications   Medication Sig Start Date End Date Taking? Authorizing Provider  apixaban (ELIQUIS) 5 MG TABS tablet Take 1 tablet (5 mg total) by mouth 2 (two) times daily. 09/01/15  Yes Layne Benton, NP  atorvastatin (LIPITOR) 20 MG tablet Take 20 mg by mouth at bedtime.    Yes Historical Provider, MD  Cholecalciferol (VITAMIN D-3 PO) Take 1 tablet by mouth daily.   Yes Historical Provider,  MD  enalapril (VASOTEC) 20 MG tablet Take 20 mg by mouth 2 (two) times daily.    Yes Historical Provider, MD  glipiZIDE (GLUCOTROL) 10 MG tablet Take 10 mg by mouth every morning. In the AM   Yes Historical Provider, MD  hydrochlorothiazide (HYDRODIURIL) 25 MG tablet Take 25 mg by mouth daily.     Yes Historical Provider, MD  HYDROcodone-acetaminophen (NORCO) 10-325 MG tablet Take 1 tablet by mouth every 6 (six) hours as needed for moderate pain. 12/24/15  Yes Lonia Skinner Sofia, PA-C  levothyroxine (SYNTHROID, LEVOTHROID) 25 MCG tablet Take 25 mcg by mouth daily. 07/14/15  Yes Historical Provider, MD  metFORMIN (GLUCOPHAGE) 500 MG tablet Take 1,000 mg by mouth 2 (two) times daily with a meal.    Yes Historical Provider, MD  metoprolol (TOPROL-XL) 50 MG 24 hr tablet Take 50 mg by mouth daily with lunch.    Yes Historical Provider, MD  pioglitazone (ACTOS) 15 MG tablet Take 15 mg by mouth daily.   Yes Historical Provider, MD   BP 122/76 mmHg  Pulse 59  Temp(Src) 97.5 F (36.4 C) (Oral)  Resp 20  Ht 5\' 2"  (1.575 m)  Wt 200 lb (90.719 kg)  BMI 36.57 kg/m2  SpO2 100% Physical Exam  Constitutional: She is oriented to person, place, and time. She appears well-developed and well-nourished. No distress.  HENT:  Head: Normocephalic.  Mouth/Throat: Oropharynx is clear and moist. No oropharyngeal exudate.  Healing contusion to the left periorbital region. Pedal mucous membranes.  Eyes: EOM are normal. Pupils are equal, round, and reactive to light.  Neck: Normal range of motion. Neck supple.  No posterior midline cervical tenderness to palpation. No meningeal signs   Cardiovascular: Normal rate and regular rhythm.  Exam reveals no gallop and no friction rub.   No murmur heard. Pulmonary/Chest: Effort normal and breath sounds normal. No respiratory distress. She has no wheezes. She has no rales. She exhibits no tenderness.  Abdominal: Soft. Bowel sounds are normal. She exhibits no distension and no  mass. There is no tenderness. There is no rebound and no guarding.  Musculoskeletal: Normal range of motion. She exhibits no edema or tenderness.  1+ bilateral lower extremity edema. Distal pulses are equal.  Neurological: She is alert and oriented to person, place, and time.  Skin: Skin is warm and dry. No rash noted. No erythema. There is pallor.  Psychiatric: She has a normal mood and affect. Her behavior is normal.  Nursing note and vitals reviewed.   ED Course  Procedures (including critical care time) Labs Review Labs Reviewed  CBC WITH DIFFERENTIAL/PLATELET - Abnormal; Notable for the following:    RBC 2.22 (*)    Hemoglobin 7.2 (*)    HCT 22.0 (*)  All other components within normal limits  COMPREHENSIVE METABOLIC PANEL - Abnormal; Notable for the following:    BUN 34 (*)    Creatinine, Ser 1.46 (*)    Calcium 8.7 (*)    Albumin 3.3 (*)    ALT 10 (*)    GFR calc non Af Amer 37 (*)    GFR calc Af Amer 42 (*)    All other components within normal limits  URINALYSIS, ROUTINE W REFLEX MICROSCOPIC (NOT AT [facility]) - Abnormal; Notable for the following:    Specific Gravity, Urine >1.030 (*)    Ketones, ur TRACE (*)    All other components within normal limits  URINE RAPID DRUG SCREEN, HOSP PERFORMED - Abnormal; Notable for the following:    Opiates POSITIVE (*)    Benzodiazepines POSITIVE (*)    Barbiturates POSITIVE (*)    All other components within normal limits  PROTIME-INR - Abnormal; Notable for the following:    Prothrombin Time 18.1 (*)    All other components within normal limits  APTT - Abnormal; Notable for the following:    aPTT 40 (*)    All other components within normal limits  CBG MONITORING, ED - Abnormal; Notable for the following:    Glucose-Capillary 135 (*)    All other components within normal limits  POC OCCULT BLOOD, ED - Abnormal; Notable for the following:    Fecal Occult Bld POSITIVE (*)    All other components within normal limits  CULTURE,  BLOOD (ROUTINE X 2)  CULTURE, BLOOD (ROUTINE X 2)  TROPONIN I  ETHANOL  OCCULT BLOOD X 1 CARD TO LAB, STOOL  I-STAT CG4 LACTIC ACID, ED  CBG MONITORING, ED  TYPE AND SCREEN  PREPARE RBC (CROSSMATCH)  ABO/RH    Imaging Review Dg Chest Port 1 View  01/20/2016  CLINICAL DATA:  Sepsis EXAM: PORTABLE CHEST 1 VIEW COMPARISON:  12/13/2009 FINDINGS: The heart size and mediastinal contours are within normal limits. Both lungs are clear. The visualized skeletal structures are unremarkable. IMPRESSION: No active disease. Electronically Signed   By: Elige KoHetal  Patel   On: 01/20/2016 19:50   I have personally reviewed and evaluated these images and lab results as part of my medical decision-making.   EKG Interpretation   Date/Time:  Thursday January 20 2016 18:48:16 EDT Ventricular Rate:  64 PR Interval:    QRS Duration: 104 QT Interval:  451 QTC Calculation: 466 R Axis:   83 Text Interpretation:  Sinus rhythm Short PR interval Borderline right axis  deviation Low voltage, precordial leads Confirmed by Ranae PalmsYELVERTON  MD, Jafar Poffenberger  ([id number]) on 01/20/2016 9:11:08 PM     CRITICAL CARE Performed by: Ranae PalmsYELVERTON, Aidaly Cordner Total critical care time: 40 minutes Critical care time was exclusive of separately billable procedures and treating other patients. Critical care was necessary to treat or prevent imminent or life-threatening deterioration. Critical care was time spent personally by me on the following activities: development of treatment plan with patient and/or surrogate as well as nursing, discussions with consultants, evaluation of patient's response to treatment, examination of patient, obtaining history from patient or surrogate, ordering and performing treatments and interventions, ordering and review of laboratory studies, ordering and review of radiographic studies, pulse oximetry and re-evaluation of patient's condition.  MDM   Final diagnoses:  Gastrointestinal hemorrhage, unspecified gastritis,  unspecified gastrointestinal hemorrhage type  Hypoglycemia  Hypothermia, initial encounter  Symptomatic anemia    Patient presents with altered mental status and hypoglycemia. Concern for GI bleed given that  the patient is on Eliquis and had a dark stool this morning. We'll continue to monitor blood glucose closely. Sepsis is also on differential given hypoglycemia and hypotension with urinary symptoms.  Normal lactic acid. Blood pressure improved with IV fluids. Noted to be anemic with guaiac positive stool. Started on PPI. Transfused 2 units of packed red blood cells. Discussed with hospitalist. Will admit to step down bed.  Loren Racer, MD 01/21/16 [phone]

## 2016-01-21 DIAGNOSIS — I10 Essential (primary) hypertension: Secondary | ICD-10-CM

## 2016-01-21 DIAGNOSIS — K254 Chronic or unspecified gastric ulcer with hemorrhage: Secondary | ICD-10-CM | POA: Diagnosis not present

## 2016-01-21 DIAGNOSIS — F418 Other specified anxiety disorders: Secondary | ICD-10-CM

## 2016-01-21 DIAGNOSIS — D500 Iron deficiency anemia secondary to blood loss (chronic): Secondary | ICD-10-CM

## 2016-01-21 DIAGNOSIS — Z79899 Other long term (current) drug therapy: Secondary | ICD-10-CM

## 2016-01-21 DIAGNOSIS — K922 Gastrointestinal hemorrhage, unspecified: Secondary | ICD-10-CM

## 2016-01-21 LAB — BASIC METABOLIC PANEL
ANION GAP: 8 (ref 5–15)
BUN: 32 mg/dL — AB (ref 6–20)
CO2: 23 mmol/L (ref 22–32)
Calcium: 8.8 mg/dL — ABNORMAL LOW (ref 8.9–10.3)
Chloride: 105 mmol/L (ref 101–111)
Creatinine, Ser: 1.31 mg/dL — ABNORMAL HIGH (ref 0.44–1.00)
GFR calc Af Amer: 48 mL/min — ABNORMAL LOW (ref >60)
GFR, EST NON AFRICAN AMERICAN: 42 mL/min — AB (ref >60)
GLUCOSE: 79 mg/dL (ref 65–99)
POTASSIUM: 4.2 mmol/L (ref 3.5–5.1)
Sodium: 136 mmol/L (ref 135–145)

## 2016-01-21 LAB — GLUCOSE, CAPILLARY
GLUCOSE-CAPILLARY: 100 mg/dL — AB (ref 65–99)
GLUCOSE-CAPILLARY: 105 mg/dL — AB (ref 65–99)
Glucose-Capillary: 85 mg/dL (ref 65–99)
Glucose-Capillary: 60 mg/dL — ABNORMAL LOW (ref 65–99)
Glucose-Capillary: 193 mg/dL — ABNORMAL HIGH (ref 65–99)
Glucose-Capillary: 216 mg/dL — ABNORMAL HIGH (ref 65–99)

## 2016-01-21 LAB — CBC
HCT: 28.2 % — ABNORMAL LOW (ref 36.0–46.0)
HEMATOCRIT: 30.7 % — AB (ref 36.0–46.0)
HEMATOCRIT: 28.8 % — AB (ref 36.0–46.0)
HEMOGLOBIN: 10.2 g/dL — AB (ref 12.0–15.0)
HEMOGLOBIN: 9.7 g/dL — AB (ref 12.0–15.0)
Hemoglobin: 9.4 g/dL — ABNORMAL LOW (ref 12.0–15.0)
MCH: 31 pg (ref 26.0–34.0)
MCH: 31.1 pg (ref 26.0–34.0)
MCH: 30.8 pg (ref 26.0–34.0)
MCHC: 33.2 g/dL (ref 30.0–36.0)
MCHC: 33.7 g/dL (ref 30.0–36.0)
MCHC: 33.3 g/dL (ref 30.0–36.0)
MCV: 93.3 fL (ref 78.0–100.0)
MCV: 92.3 fL (ref 78.0–100.0)
MCV: 92.5 fL (ref 78.0–100.0)
PLATELETS: 227 10*3/uL (ref 150–400)
Platelets: 206 10*3/uL (ref 150–400)
Platelets: 212 10*3/uL (ref 150–400)
RBC: 3.29 MIL/uL — ABNORMAL LOW (ref 3.87–5.11)
RBC: 3.12 MIL/uL — ABNORMAL LOW (ref 3.87–5.11)
RBC: 3.05 MIL/uL — AB (ref 3.87–5.11)
RDW: 18.1 % — AB (ref 11.5–15.5)
RDW: 18.3 % — AB (ref 11.5–15.5)
RDW: 18.2 % — AB (ref 11.5–15.5)
WBC: 6.8 10*3/uL (ref 4.0–10.5)
WBC: 7.1 10*3/uL (ref 4.0–10.5)
WBC: 6.7 10*3/uL (ref 4.0–10.5)

## 2016-01-21 LAB — MRSA PCR SCREENING: MRSA by PCR: NEGATIVE

## 2016-01-21 MED ORDER — HYDROCODONE-ACETAMINOPHEN 10-325 MG PO TABS
1.0000 | ORAL_TABLET | Freq: Four times a day (QID) | ORAL | Status: DC | PRN
  Administered 2016-01-21 – 2016-01-24 (×8): 1 via ORAL
  Filled 2016-01-21 (×8): qty 1

## 2016-01-21 MED ORDER — DIAZEPAM 5 MG PO TABS
10.0000 mg | ORAL_TABLET | Freq: Every evening | ORAL | Status: DC | PRN
  Administered 2016-01-21 – 2016-01-23 (×3): 10 mg via ORAL
  Filled 2016-01-21 (×3): qty 2

## 2016-01-21 MED ORDER — PROMETHAZINE HCL 25 MG/ML IJ SOLN
12.5000 mg | INTRAMUSCULAR | Status: AC
  Administered 2016-01-22: 12.5 mg via INTRAVENOUS
  Filled 2016-01-21: qty 1

## 2016-01-21 MED ORDER — DEXTROSE-NACL 5-0.9 % IV SOLN
INTRAVENOUS | Status: DC
  Administered 2016-01-21 – 2016-01-23 (×6): via INTRAVENOUS

## 2016-01-21 MED ORDER — SODIUM CHLORIDE 0.9 % IV SOLN: INTRAVENOUS | Status: DC

## 2016-01-21 NOTE — Consult Note (Signed)
Referring Provider: Houston Siren, MD Primary Care Physician:  Milana Obey, MD Primary Gastroenterologist:  Roetta Sessions, MD  Reason for Consultation:  Sandria Manly bleed  HPI: Megan [name] is a 65 y.o. 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 Complained of generalized weakness and fatigue. Tried to do some stuff yesterday AM but too tired/weak. Laid in a recliner and fell asleep. Family were unable to wake her. She states she has had very black stools on Wednesday and Thursday. No brbpr. No constipation, diarrhea, heartburn. No vomiting. C/o epigastric pain. Hgb 7 on presentation. Has received 2 units of PRBCs and Hgb up to 10. She has been taking Aleve, 2 daily chronically. Larey Seat recently and suffered facial fracture.   Appears her Hgb was in 10 range back in 08/2015. Per ICU nursing she is having large loose brown stools. Last stool brown and no blood noted and no reported brbpr. Heme + stool.   Last dose of Eliquis 01/20/16 at 9am at home.     Prior to Admission medications   Medication Sig Start Date End Date Taking? Authorizing Provider  apixaban (ELIQUIS) 5 MG TABS tablet Take 1 tablet (5 mg total) by mouth 2 (two) times daily. 09/01/15  Yes Layne Benton, NP  atorvastatin (LIPITOR) 20 MG tablet Take 20 mg by mouth at bedtime.    Yes Historical Provider, MD  Cholecalciferol (VITAMIN D-3 PO) Take 1 tablet by mouth daily.   Yes Historical Provider, MD  enalapril (VASOTEC) 20 MG tablet Take 20 mg by mouth 2 (two) times daily.    Yes Historical Provider, MD  glipiZIDE (GLUCOTROL) 10 MG tablet Take 10 mg by mouth every morning. In the AM   Yes Historical Provider, MD  hydrochlorothiazide (HYDRODIURIL) 25 MG tablet Take 25 mg by mouth daily.     Yes Historical Provider, MD  HYDROcodone-acetaminophen (NORCO) 10-325 MG tablet Take 1 tablet by mouth every 6  (six) hours as needed for moderate pain. 12/24/15  Yes Lonia Skinner Sofia, PA-C  levothyroxine (SYNTHROID, LEVOTHROID) 25 MCG tablet Take 25 mcg by mouth daily. 07/14/15  Yes Historical Provider, MD  metFORMIN (GLUCOPHAGE) 500 MG tablet Take 1,000 mg by mouth 2 (two) times daily with a meal.    Yes Historical Provider, MD  metoprolol (TOPROL-XL) 50 MG 24 hr tablet Take 50 mg by mouth daily with lunch.    Yes Historical Provider, MD  pioglitazone (ACTOS) 15 MG tablet Take 15 mg by mouth daily.   Yes Historical Provider, MD    Current Facility-Administered Medications  Medication Dose Route Frequency Provider Last Rate Last Dose  . acetaminophen (TYLENOL) tablet 650 mg  650 mg Oral Q6H PRN Jonah Blue, MD   650 mg at 01/21/16 1127   Or  . acetaminophen (TYLENOL) suppository 650 mg  650 mg Rectal Q6H PRN Jonah Blue, MD      . atorvastatin (LIPITOR) tablet 20 mg  20 mg Oral QHS Jonah Blue, MD   20 mg at 01/20/16 2300  . dextrose 5 %-0.9 % sodium chloride infusion   Intravenous Continuous Houston Siren, MD 125 mL/hr at 01/21/16 0912    . insulin aspart (novoLOG) injection 0-15 Units  0-15 Units Subcutaneous Q4H Jonah Blue, MD   0 Units at 01/21/16 0000  . levothyroxine (SYNTHROID, LEVOTHROID) tablet 25 mcg  25 mcg Oral QAC breakfast Jonah Blue, MD  25 mcg at 01/21/16 0912  . metoprolol succinate (TOPROL-XL) 24 hr tablet 50 mg  50 mg Oral Q lunch Jonah Blue, MD   50 mg at 01/21/16 1121  . morphine 2 MG/ML injection 2 mg  2 mg Intravenous Q2H PRN Jonah Blue, MD      . ondansetron [facility]) tablet 4 mg  4 mg Oral Q6H PRN Jonah Blue, MD       Or  . ondansetron [facility]) injection 4 mg  4 mg Intravenous Q6H PRN Jonah Blue, MD      . pantoprazole (PROTONIX) 80 mg in sodium chloride 0.9 % 250 mL (0.32 mg/mL) infusion  8 mg/hr Intravenous Continuous Jonah Blue, MD 25 mL/hr at 01/21/16 1121 8 mg/hr at 01/21/16 1121  . [START ON 01/24/2016] pantoprazole (PROTONIX) injection 40 mg  40  mg Intravenous Q12H Jonah Blue, MD      . sodium chloride flush (NS) 0.9 % injection 3 mL  3 mL Intravenous Q12H Jonah Blue, MD   3 mL at 01/21/16 0913    Allergies as of 01/20/2016 - Review Complete 01/20/2016  Allergen Reaction Noted  . Naproxen Other (See Comments) 04/05/2011    Past Medical History  Diagnosis Date  . HTN (hypertension)   . DM (diabetes mellitus) (HCC)   . Anxiety   . Depression   . Obesity   . Colon polyps   . Complication of anesthesia   . PONV (postoperative nausea and vomiting)   . Agoraphobia with panic disorder   . Chronic pain 03/08/15    Hydrocodone-APAP 10/325 mg, # 120 q month  . Stroke [facility])     Past Surgical History  Procedure Laterality Date  . Cardiac catheterization  2004    normal  . Abdominal hysterectomy  15 yrs ago    with bso at aph  . Cholecystectomy  25 yrs ago    [facility]  . Colonoscopy  05/04/11    left-sided colonic diverticulosis  . Esophagogastroduodenoscopy  05/04/2011    Abnormal distal esophagus, biopsies consistent with inflammation due to acid reflux. No evidence of Barrett's. No H. pylori  . Radiology with anesthesia N/A 08/20/2015    Procedure: RADIOLOGY WITH ANESTHESIA;  Surgeon: Julieanne Cotton, MD;  Location: [facility] OR;  Service: Radiology;  Laterality: N/A;    Family History  Problem Relation Age of Onset  . Colon cancer Paternal Aunt     2, colon cancer  . Colon cancer Paternal Uncle     6, colon cancer  . Colon cancer Father     deceased, age 75  . Colon cancer      numerous cousins died before age 57 with colon cancer  . Colon cancer Sister     deceased, age 5  . Anesthesia problems Neg Hx   . Hypotension Neg Hx   . Malignant hyperthermia Neg Hx   . Pseudochol deficiency Neg Hx     Social History   Social History  . Marital Status: Married    Spouse Name: N/A  . Number of Children: 3  . Years of Education: N/A   Occupational History  . disability    Social History Main Topics  . Smoking  status: Former Smoker -- 0.25 packs/day for 15 years    Types: Cigarettes    Start date: 07/17/1969    Quit date: 07/18/1971  . Smokeless tobacco: Current User    Types: Snuff     Comment: never more than 1-2 cig/day in entire life  . Alcohol  Use: No  . Drug Use: No  . Sexual Activity: Yes    Birth Control/ Protection: Surgical   Other Topics Concern  . Not on file   Social History Narrative   No contact with children since they left home at age 66. Doesn't know where they are.     ROS:  General: Negative for anorexia, weight loss, fever, chills, fatigue. + weakness. Eyes: Negative for vision changes.  ENT: Negative for hoarseness, difficulty swallowing , nasal congestion. CV: Negative for chest pain, angina, palpitations, dyspnea on exertion, peripheral edema.  Respiratory: Negative for dyspnea at rest, dyspnea on exertion, cough, sputum, wheezing.  GI: See history of present illness. GU:  Negative for dysuria, hematuria, urinary incontinence, urinary frequency, nocturnal urination.  MS: Negative for joint pain, low back pain.  Derm: Negative for rash or itching.  Neuro: Negative for  seizure, frequent headaches, memory loss, confusion. Chronic left sided weakness. Psych: Negative for anxiety, depression, suicidal ideation, hallucinations.  Endo: Negative for unusual weight change.  Heme: Negative for bruising or bleeding. Allergy: Negative for rash or hives.       Physical Examination: Vital signs in last 24 hours: Temp:  [94.3 F (34.6 C)-98.2 F (36.8 C)] 98.2 F (36.8 C) (07/07 1200) Pulse Rate:  [48-71] 48 (07/07 1300) Resp:  [12-25] 20 (07/07 1300) BP: (86-166)/(40-118) 136/89 mmHg (07/07 1300) SpO2:  [93 %-100 %] 98 % (07/07 1300) Weight:  [200 lb (90.719 kg)-208 lb 5.4 oz (94.5 kg)] 208 lb 5.4 oz (94.5 kg) (07/06 2300)    General: Well-nourished, well-developed in no acute distress. Obese.  Head: Normocephalic, atraumatic.   Eyes: Conjunctiva pink, no  icterus. Mouth: Oropharyngeal mucosa moist and pink , no lesions erythema or exudate. Neck: Supple without thyromegaly, masses, or lymphadenopathy.  Lungs: Clear to auscultation bilaterally.  Heart: Regular rate and rhythm, no murmurs rubs or gallops.  Abdomen: Bowel sounds are normal, moderate epigastric tenderness, nondistended, no hepatosplenomegaly or masses, no abdominal bruits or    hernia , no rebound or guarding.   Rectal: not performed Extremities: 1+lower lower extremity edema, No clubbing, deformity.  Neuro: Alert and oriented x 4 , grossly normal neurologically.  Skin: Warm and dry, no rash or jaundice.   Psych: Alert and cooperative, normal mood and affect.        Intake/Output from previous day: 07/06 0701 - 07/07 0700 In: 1005.4 [I.V.:235.4; Blood:670; IV Piggyback:100] Out: 36 [Urine:36] Intake/Output this shift: Total I/O In: -  Out: 1 [Urine:1]  Lab Results: CBC  Recent Labs  01/20/16 1925 01/21/16 0820  WBC 6.5 6.8  HGB 7.2* 10.2*  HCT 22.0* 30.7*  MCV 99.1 93.3  PLT 237 206   BMET  Recent Labs  01/20/16 1925 01/21/16 0820  NA 135 136  K 4.6 4.2  CL 102 105  CO2 26 23  GLUCOSE 94 79  BUN 34* 32*  CREATININE 1.46* 1.31*  CALCIUM 8.7* 8.8*   LFT  Recent Labs  01/20/16 1925  BILITOT 0.9  ALKPHOS 73  AST 15  ALT 10*  PROT 6.7  ALBUMIN 3.3*    Lipase No results for input(s): LIPASE in the last 72 hours.  PT/INR  Recent Labs  01/20/16 1925  LABPROT 18.1*  INR 1.49      Imaging Studies: Ct Head Wo Contrast  12/24/2015  CLINICAL DATA:  Pain following fall EXAM: CT HEAD WITHOUT CONTRAST CT MAXILLOFACIAL WITHOUT CONTRAST TECHNIQUE: Multidetector CT imaging of the head and maxillofacial structures were performed using  the standard protocol without intravenous contrast. Multiplanar CT image reconstructions of the maxillofacial structures were also generated. COMPARISON:  Head CT December 22, 2015 FINDINGS: CT HEAD FINDINGS Mild diffuse  atrophy is stable. There is no intracranial mass, hemorrhage, extra-axial fluid collection, or midline shift. There is a prior infarct involving much of the right lentiform nucleus as well as essentially all of the right internal and external capsules. There is evidence of a prior infarct in the anterior right temporal lobe as well as in the right posterior frontal lobe, stable. There is slight small vessel disease in the centra semiovale bilaterally. No new gray-white compartment lesions are evident. The bony calvarium appears intact. The mastoid air cells are clear. CT MAXILLOFACIAL FINDINGS There is a nondisplaced fracture of the anterior left maxillary antrum. There is a mildly displaced fracture along the inferior aspect of the lateral maxillary antrum with the more superior bony fragment displaced medially with respect to the inferior bony fragment in this area. There is diffuse opacification of the left maxillary antrum. The left orbital floor appears intact as does the medial wall of the left maxillary antrum. No other fractures are evident. No dislocation. There is diffuse soft tissue swelling over the left mid upper face with developing areas of hematoma in the left mid upper face. There is also preseptal edema over the left orbit. There is no intraorbital lesion. Orbits appear symmetric bilaterally. Paranasal sinuses are clear apart from the apparent hemorrhage in the left maxillary antrum. The ostiomeatal unit complex on the left is obstructed. The ostiomeatal unit complex the right is patent. There is probable hemorrhage in the left near ease with edema of the left nasal turbinate. There is rightward deviation of the nasal septum. Salivary glands appear normal. No adenopathy is evident. Visualized pharynx appears unremarkable. IMPRESSION: CT head: Atrophy with prior right-sided infarcts, stable. Mild periventricular small vessel disease. No intracranial mass, hemorrhage, or extra-axial fluid collection.  No acute infarct evident. CT maxillofacial: Fractures of the anterior and lateral left maxillary antrum. There is mild displacement of fracture fragments along the inferior aspect of the lateral maxillary antrum. The anterior maxillary antral fracture is nondisplaced. No other fractures are evident. No dislocations. There is moderate soft tissue swelling over the left face with developing hematoma. There is preseptal edema over the left orbit. There is no intraorbital lesion on either side. Paranasal sinuses are clear except for the hemorrhage in the left maxillary antrum. Partial obstruction of the left naris is probably also due to hemorrhage. There is obstruction of the ostiomeatal unit complex on the left. The ostiomeatal unit complex on the right is patent. Rightward deviation of nasal septum noted. Electronically Signed   By: Bretta Bang III M.D.   On: 12/24/2015 10:45   Ct Head Wo Contrast  12/22/2015  CLINICAL DATA:  Left-sided headaches for 2 days, initial encounter EXAM: CT HEAD WITHOUT CONTRAST TECHNIQUE: Contiguous axial images were obtained from the base of the skull through the vertex without intravenous contrast. COMPARISON:  09/28/2015 MRI of the head FINDINGS: Bony calvarium is intact. Changes of prior right MCA infarct are again seen. Involvement of the right basal ganglia is also now identified. No findings to suggest acute hemorrhage, acute infarction or space-occupying mass lesion are noted. IMPRESSION: Prior infarcts on the right similar to that seen on recent MRI. No acute intracranial abnormality noted. Electronically Signed   By: Alcide Clever M.D.   On: 12/22/2015 23:46   Dg Chest Port 1 View  01/20/2016  CLINICAL DATA:  Sepsis EXAM: PORTABLE CHEST 1 VIEW COMPARISON:  12/13/2009 FINDINGS: The heart size and mediastinal contours are within normal limits. Both lungs are clear. The visualized skeletal structures are unremarkable. IMPRESSION: No active disease. Electronically Signed    By: Elige Ko   On: 01/20/2016 19:50   Ct Maxillofacial Wo Cm  12/24/2015  CLINICAL DATA:  Pain following fall EXAM: CT HEAD WITHOUT CONTRAST CT MAXILLOFACIAL WITHOUT CONTRAST TECHNIQUE: Multidetector CT imaging of the head and maxillofacial structures were performed using the standard protocol without intravenous contrast. Multiplanar CT image reconstructions of the maxillofacial structures were also generated. COMPARISON:  Head CT December 22, 2015 FINDINGS: CT HEAD FINDINGS Mild diffuse atrophy is stable. There is no intracranial mass, hemorrhage, extra-axial fluid collection, or midline shift. There is a prior infarct involving much of the right lentiform nucleus as well as essentially all of the right internal and external capsules. There is evidence of a prior infarct in the anterior right temporal lobe as well as in the right posterior frontal lobe, stable. There is slight small vessel disease in the centra semiovale bilaterally. No new gray-white compartment lesions are evident. The bony calvarium appears intact. The mastoid air cells are clear. CT MAXILLOFACIAL FINDINGS There is a nondisplaced fracture of the anterior left maxillary antrum. There is a mildly displaced fracture along the inferior aspect of the lateral maxillary antrum with the more superior bony fragment displaced medially with respect to the inferior bony fragment in this area. There is diffuse opacification of the left maxillary antrum. The left orbital floor appears intact as does the medial wall of the left maxillary antrum. No other fractures are evident. No dislocation. There is diffuse soft tissue swelling over the left mid upper face with developing areas of hematoma in the left mid upper face. There is also preseptal edema over the left orbit. There is no intraorbital lesion. Orbits appear symmetric bilaterally. Paranasal sinuses are clear apart from the apparent hemorrhage in the left maxillary antrum. The ostiomeatal unit complex  on the left is obstructed. The ostiomeatal unit complex the right is patent. There is probable hemorrhage in the left near ease with edema of the left nasal turbinate. There is rightward deviation of the nasal septum. Salivary glands appear normal. No adenopathy is evident. Visualized pharynx appears unremarkable. IMPRESSION: CT head: Atrophy with prior right-sided infarcts, stable. Mild periventricular small vessel disease. No intracranial mass, hemorrhage, or extra-axial fluid collection. No acute infarct evident. CT maxillofacial: Fractures of the anterior and lateral left maxillary antrum. There is mild displacement of fracture fragments along the inferior aspect of the lateral maxillary antrum. The anterior maxillary antral fracture is nondisplaced. No other fractures are evident. No dislocations. There is moderate soft tissue swelling over the left face with developing hematoma. There is preseptal edema over the left orbit. There is no intraorbital lesion on either side. Paranasal sinuses are clear except for the hemorrhage in the left maxillary antrum. Partial obstruction of the left naris is probably also due to hemorrhage. There is obstruction of the ostiomeatal unit complex on the left. The ostiomeatal unit complex on the right is patent. Rightward deviation of nasal septum noted. Electronically Signed   By: Bretta Bang III M.D.   On: 12/24/2015 10:45  [4 week]   Impression: 65 y/o female with h/o Afib, suffered a CVA in 08/2015 and started on Eliquis at that time who presents with generalized weakness. Noted decline in Hgb from 10 to 7. Patient reports melena X  2 days. She is heme +. Has received 2 units of prbcs with appropriate response. No melena since admission. +NSAIDs. Suspect UGI bleed likely ulcer.   Last dose of Eliquis 01/20/16 at 9am.  Plan: 1. PPI.  2. Plan on upper endoscopy in the morning with Dr. Karilyn Cotaehman (providing GI coverage this weekend).  I have discussed the risks,  alternatives, benefits with regards to but not limited to the risk of reaction to medication, bleeding, infection, perforation and the patient is agreeable to proceed. Written consent to be obtained. 3. Monitor for ongoing bleeding.   Leanna BattlesLeslie S. Dixon BoosLewis, PA-C [facility] [phone] 7/7/20174:17 PM     LOS: 1 day

## 2016-01-21 NOTE — Care Management Important Message (Signed)
Important Message  Patient Details  Name: Megan LeekVirginia A Wyche MRN: [id number] Date of Birth: 10/22/1950   Medicare Important Message Given:  Yes    Erial Fikes, Chrystine OilerSharley Diane, RN 01/21/2016, 1:48 PM

## 2016-01-21 NOTE — Progress Notes (Signed)
[facility] PROGRESS NOTE  Megan LeekVirginia A Ostroff ZOX:[id number] DOB: 03/17/1951    PCP:   Milana ObeyStephen D Knowlton, MD   HPI: Megan [name] is an 65 y.o. female with hx of HTN, DM, afib and prior CVA on Eliquis, chronic pain, admitted for upper GI bleed with Hb of 7g per dL and was in mild shock (hypotensive, hypothermic...) She had fallen a week prior, and had facial Fx.  She is on pain medication.  She was transfused 2 units of PRBCs, and GI has been consulted. She has been taking Aleve.   Rewiew of Systems:  Constitutional: Negative for malaise, fever and chills. No significant weight loss or weight gain Eyes: Negative for eye pain, redness and discharge, diplopia, visual changes, or flashes of light. ENMT: Negative for ear pain, hoarseness, nasal congestion, sinus pressure and sore throat. No headaches; tinnitus, drooling, or problem swallowing. Cardiovascular: Negative for chest pain, palpitations, diaphoresis, dyspnea and peripheral edema. ; No orthopnea, PND Respiratory: Negative for cough, hemoptysis, wheezing and stridor. No pleuritic chestpain. Gastrointestinal: Negative for nausea, vomiting, diarrhea, constipation, abdominal pain, melena, blood in stool, hematemesis, jaundice and rectal bleeding.    Genitourinary: Negative for frequency, dysuria, incontinence,flank pain and hematuria; Musculoskeletal: Negative for back pain and neck pain. Negative for swelling and trauma.;  Skin: . Negative for pruritus, rash, abrasions, bruising and skin lesion.; ulcerations Neuro: Negative for headache, lightheadedness and neck stiffness. Negative for weakness, altered level of consciousness , altered mental status, extremity weakness, burning feet, involuntary movement, seizure and syncope.  Psych: negative for anxiety, depression, insomnia, tearfulness, panic attacks, hallucinations, paranoia, suicidal or homicidal ideation    Past Medical History  Diagnosis Date  . HTN (hypertension)    . DM (diabetes mellitus) (HCC)   . Anxiety   . Depression   . Obesity   . Colon polyps   . Complication of anesthesia   . PONV (postoperative nausea and vomiting)   . Agoraphobia with panic disorder   . Chronic pain 03/08/15    Hydrocodone-APAP 10/325 mg, # 120 q month  . Stroke [facility](HCC)     Past Surgical History  Procedure Laterality Date  . Cardiac catheterization  2004    normal  . Abdominal hysterectomy  15 yrs ago    with bso at aph  . Cholecystectomy  25 yrs ago    [facility]  . Colonoscopy  05/04/11    left-sided colonic diverticulosis  . Esophagogastroduodenoscopy  05/04/2011    Abnormal distal esophagus, biopsies consistent with inflammation due to acid reflux. No evidence of Barrett's. No H. pylori  . Radiology with anesthesia N/A 08/20/2015    Procedure: RADIOLOGY WITH ANESTHESIA;  Surgeon: Julieanne CottonSanjeev Deveshwar, MD;  [facility];  Service: Radiology;  Laterality: N/A;    Medications:  HOME MEDS: Prior to Admission medications   Medication Sig Start Date End Date Taking? Authorizing Provider  apixaban (ELIQUIS) 5 MG TABS tablet Take 1 tablet (5 mg total) by mouth 2 (two) times daily. 09/01/15  Yes Layne BentonSharon L Biby, NP  atorvastatin (LIPITOR) 20 MG tablet Take 20 mg by mouth at bedtime.    Yes Historical Provider, MD  Cholecalciferol (VITAMIN D-3 PO) Take 1 tablet by mouth daily.   Yes Historical Provider, MD  enalapril (VASOTEC) 20 MG tablet Take 20 mg by mouth 2 (two) times daily.    Yes Historical Provider, MD  glipiZIDE (GLUCOTROL) 10 MG tablet Take 10 mg by mouth every morning. In the AM   Yes Historical Provider, MD  hydrochlorothiazide (HYDRODIURIL) 25 MG tablet Take 25 mg by mouth daily.     Yes Historical Provider, MD  HYDROcodone-acetaminophen (NORCO) 10-325 MG tablet Take 1 tablet by mouth every 6 (six) hours as needed for moderate pain. 12/24/15  Yes Lonia SkinnerLeslie K Sofia, PA-C  levothyroxine (SYNTHROID, LEVOTHROID) 25 MCG tablet Take 25 mcg by mouth daily. 07/14/15  Yes  Historical Provider, MD  metFORMIN (GLUCOPHAGE) 500 MG tablet Take 1,000 mg by mouth 2 (two) times daily with a meal.    Yes Historical Provider, MD  metoprolol (TOPROL-XL) 50 MG 24 hr tablet Take 50 mg by mouth daily with lunch.    Yes Historical Provider, MD  pioglitazone (ACTOS) 15 MG tablet Take 15 mg by mouth daily.   Yes Historical Provider, MD     Allergies:  Allergies  Allergen Reactions  . Naproxen Other (See Comments)    Causes stomach burning    Social History:   reports that she quit smoking about 44 years ago. Her smoking use included Cigarettes. She started smoking about 46 years ago. She has a 3.75 pack-year smoking history. Her smokeless tobacco use includes Snuff. She reports that she does not drink alcohol or use illicit drugs.  Family History: Family History  Problem Relation Age of Onset  . Colon cancer Paternal Aunt     2, colon cancer  . Colon cancer Paternal Uncle     6, colon cancer  . Colon cancer Father     deceased, age 66  . Colon cancer      numerous cousins died before age 66 with colon cancer  . Colon cancer Sister     deceased, age 66  . Anesthesia problems Neg Hx   . Hypotension Neg Hx   . Malignant hyperthermia Neg Hx   . Pseudochol deficiency Neg Hx      Physical Exam: Filed Vitals:   01/21/16 0559 01/21/16 0600 01/21/16 0700 01/21/16 0800  BP:  117/62 126/69   Pulse:  60 57   Temp: 97.2 F (36.2 C)   97 F (36.1 C)  TempSrc: Oral   Oral  Resp:  15 18   Height:      Weight:      SpO2:  100% 99%    Blood pressure 126/69, pulse 57, temperature 97 F (36.1 C), temperature source Oral, resp. rate 18, height 5\' 2"  (1.575 m), weight 94.5 kg (208 lb 5.4 oz), SpO2 99 %.  GEN:  Pleasant patient lying in the stretcher in no acute distress; cooperative with exam. PSYCH:  alert and oriented x4; does not appear anxious or depressed; affect is appropriate. HEENT: Mucous membranes pink and anicteric; PERRLA; EOM intact; no cervical  lymphadenopathy nor thyromegaly or carotid bruit; no JVD; There were no stridor. Neck is very supple. Breasts:: Not examined CHEST WALL: No tenderness CHEST: Normal respiration, clear to auscultation bilaterally.  HEART: Regular rate and rhythm.  There are no murmur, rub, or gallops.   BACK: No kyphosis or scoliosis; no CVA tenderness ABDOMEN: soft and non-tender; no masses, no organomegaly, normal abdominal bowel sounds; no pannus; no intertriginous candida. There is no rebound and no distention. Rectal Exam: Not done EXTREMITIES: No bone or joint deformity; age-appropriate arthropathy of the hands and knees; no edema; no ulcerations.  There is no calf tenderness. Genitalia: not examined PULSES: 2+ and symmetric SKIN: Normal hydration no rash or ulceration CNS: Cranial nerves 2-12 grossly intact no focal lateralizing neurologic deficit.  Speech is fluent; uvula elevated with phonation, facial  symmetry and tongue midline. DTR are normal bilaterally, cerebella exam is intact, barbinski is negative and strengths are equaled bilaterally.  No sensory loss.   Labs on Admission:  Basic Metabolic Panel:  Recent Labs Lab 01/20/16 1925  NA 135  K 4.6  CL 102  CO2 26  GLUCOSE 94  BUN 34*  CREATININE 1.46*  CALCIUM 8.7*   Liver Function Tests:  Recent Labs Lab 01/20/16 1925  AST 15  ALT 10*  ALKPHOS 73  BILITOT 0.9  PROT 6.7  ALBUMIN 3.3*   CBC:  Recent Labs Lab 01/20/16 1925  WBC 6.5  NEUTROABS 5.2  HGB 7.2*  HCT 22.0*  MCV 99.1  PLT 237   Cardiac Enzymes:  Recent Labs Lab 01/20/16 1925  TROPONINI <0.03    CBG:  Recent Labs Lab 01/20/16 1932 01/20/16 2206 01/21/16 0045 01/21/16 0446 01/21/16 0723  GLUCAP 84 73 100* 85 60*     Radiological Exams on Admission: Dg Chest Port 1 View  01/20/2016  CLINICAL DATA:  Sepsis EXAM: PORTABLE CHEST 1 VIEW COMPARISON:  12/13/2009 FINDINGS: The heart size and mediastinal contours are within normal limits. Both lungs  are clear. The visualized skeletal structures are unremarkable. IMPRESSION: No active disease. Electronically Signed   By: Elige Ko   On: 01/20/2016 19:50    Assessment/Plan Present on Admission:  . Hypoglycemia . Upper GI bleeding . Chronic pain . Benign essential HTN . Anxiety and depression . Blood loss anemia  PLAN:  GI Bleed:  Suspect UGIB from ulcer with NSAIDS use and Eliquis.  Stopped both.  Clear liquid today and await GI consultation for EGD likely.   Continue with PPI drip.    HTN:  Anti HTN held, except betablocker.  Chronic pain syndrome:  PRN morphine.  No NSAIDS.   DM:  SSI.  She was hypoglycemic on presentation.   Other plans as per orders. Code Status: FULL Unk Lightning, MD.  FACP [facility] Pager [phone] 7pm to 7am.  01/21/2016, 8:47 AM

## 2016-01-22 ENCOUNTER — Encounter (HOSPITAL_COMMUNITY): Payer: Self-pay | Admitting: *Deleted

## 2016-01-22 ENCOUNTER — Encounter (HOSPITAL_COMMUNITY)
Admission: EM | Disposition: A | Discharge status: Home / Self Care | Payer: Self-pay | Source: Home / Self Care | Attending: Internal Medicine

## 2016-01-22 DIAGNOSIS — K921 Melena: Secondary | ICD-10-CM

## 2016-01-22 DIAGNOSIS — D62 Acute posthemorrhagic anemia: Secondary | ICD-10-CM

## 2016-01-22 DIAGNOSIS — K228 Other specified diseases of esophagus: Secondary | ICD-10-CM

## 2016-01-22 DIAGNOSIS — K254 Chronic or unspecified gastric ulcer with hemorrhage: Secondary | ICD-10-CM

## 2016-01-22 DIAGNOSIS — K3189 Other diseases of stomach and duodenum: Secondary | ICD-10-CM

## 2016-01-22 DIAGNOSIS — K259 Gastric ulcer, unspecified as acute or chronic, without hemorrhage or perforation: Secondary | ICD-10-CM

## 2016-01-22 HISTORY — PX: ESOPHAGOGASTRODUODENOSCOPY: SHX5428

## 2016-01-22 LAB — CBC
HCT: 28.3 % — ABNORMAL LOW (ref 36.0–46.0)
HEMOGLOBIN: 9.4 g/dL — AB (ref 12.0–15.0)
MCH: 30.7 pg (ref 26.0–34.0)
MCHC: 33.2 g/dL (ref 30.0–36.0)
MCV: 92.5 fL (ref 78.0–100.0)
Platelets: 228 10*3/uL (ref 150–400)
RBC: 3.06 MIL/uL — AB (ref 3.87–5.11)
RDW: 18 % — ABNORMAL HIGH (ref 11.5–15.5)
WBC: 6.6 10*3/uL (ref 4.0–10.5)

## 2016-01-22 LAB — TYPE AND SCREEN
ABO/RH(D): O POS
ANTIBODY SCREEN: NEGATIVE
UNIT DIVISION: 0
UNIT DIVISION: 0

## 2016-01-22 LAB — GLUCOSE, CAPILLARY
GLUCOSE-CAPILLARY: 138 mg/dL — AB (ref 65–99)
GLUCOSE-CAPILLARY: 156 mg/dL — AB (ref 65–99)
GLUCOSE-CAPILLARY: 159 mg/dL — AB (ref 65–99)
GLUCOSE-CAPILLARY: 209 mg/dL — AB (ref 65–99)
Glucose-Capillary: 193 mg/dL — ABNORMAL HIGH (ref 65–99)
Glucose-Capillary: 188 mg/dL — ABNORMAL HIGH (ref 65–99)
Glucose-Capillary: 126 mg/dL — ABNORMAL HIGH (ref 65–99)

## 2016-01-22 SURGERY — EGD (ESOPHAGOGASTRODUODENOSCOPY)
Anesthesia: Moderate Sedation

## 2016-01-22 MED ORDER — SODIUM CHLORIDE 0.9 % IV SOLN
INTRAVENOUS | Status: DC | PRN
  Administered 2016-01-22: 150 mL via INTRAMUSCULAR

## 2016-01-22 MED ORDER — MEPERIDINE HCL 50 MG/ML IJ SOLN
INTRAMUSCULAR | Status: DC | PRN
  Administered 2016-01-22 (×2): 25 mg

## 2016-01-22 MED ORDER — SUCRALFATE 1 GM/10ML PO SUSP
1.0000 g | Freq: Three times a day (TID) | ORAL | Status: DC
  Administered 2016-01-22 – 2016-01-24 (×8): 1 g via ORAL
  Filled 2016-01-22 (×8): qty 10

## 2016-01-22 MED ORDER — MIDAZOLAM HCL 5 MG/5ML IJ SOLN
INTRAMUSCULAR | Status: AC
  Administered 2016-01-22: 12:00:00
  Filled 2016-01-22: qty 5

## 2016-01-22 MED ORDER — INSULIN ASPART 100 UNIT/ML ~~LOC~~ SOLN: 0.0000 [IU] | Freq: Every day | SUBCUTANEOUS | Status: DC

## 2016-01-22 MED ORDER — STERILE WATER FOR IRRIGATION IR SOLN
Status: DC | PRN
  Administered 2016-01-22: 2.5 mL

## 2016-01-22 MED ORDER — MEPERIDINE HCL 50 MG/ML IJ SOLN
INTRAMUSCULAR | Status: AC
  Administered 2016-01-22: 11:00:00
  Filled 2016-01-22: qty 1

## 2016-01-22 MED ORDER — BUTAMBEN-TETRACAINE-BENZOCAINE 2-2-14 % EX AERO
INHALATION_SPRAY | CUTANEOUS | Status: DC | PRN
  Administered 2016-01-22: 2 via TOPICAL

## 2016-01-22 MED ORDER — INSULIN ASPART 100 UNIT/ML ~~LOC~~ SOLN
0.0000 [IU] | Freq: Three times a day (TID) | SUBCUTANEOUS | Status: DC
  Administered 2016-01-23: 2 [IU] via SUBCUTANEOUS
  Administered 2016-01-23 (×2): 3 [IU] via SUBCUTANEOUS

## 2016-01-22 MED ORDER — MIDAZOLAM HCL 5 MG/5ML IJ SOLN
INTRAMUSCULAR | Status: DC | PRN
  Administered 2016-01-22 (×2): 1 mg via INTRAVENOUS

## 2016-01-22 NOTE — Progress Notes (Signed)
[facility] PROGRESS NOTE  Megan [name] ZOX:[id number] DOB: 10/18/1950    PCP:   Megan Obey, MD   HPI:  Megan [name] is an 65 y.o. female with hx of HTN, DM, afib and prior CVA on Eliquis, chronic pain, admitted for upper GI bleed with Hb of 7g per dL and was in mild shock (hypotensive, hypothermic...) She had fallen a week prior, and had facial Fx. She is on pain medication. She was transfused 2 units of PRBCs, and GI has been consulted. She has been taking Aleve. Her Hb rose to 10g per dL, and stable at 9g/dL and GI planned to perform EGD today.    Rewiew of Systems:  Constitutional: Negative for malaise, fever and chills. No significant weight loss or weight gain Eyes: Negative for eye pain, redness and discharge, diplopia, visual changes, or flashes of light. ENMT: Negative for ear pain, hoarseness, nasal congestion, sinus pressure and sore throat. No headaches; tinnitus, drooling, or problem swallowing. Cardiovascular: Negative for chest pain, palpitations, diaphoresis, dyspnea and peripheral edema. ; No orthopnea, PND Respiratory: Negative for cough, hemoptysis, wheezing and stridor. No pleuritic chestpain. Gastrointestinal: Negative for nausea, vomiting, diarrhea, constipation, abdominal pain, melena, blood in stool, hematemesis, jaundice and rectal bleeding.    Genitourinary: Negative for frequency, dysuria, incontinence,flank pain and hematuria; Musculoskeletal: Negative for back pain and neck pain. Negative for swelling and trauma.;  Skin: . Negative for pruritus, rash, abrasions, bruising and skin lesion.; ulcerations Neuro: Negative for headache, lightheadedness and neck stiffness. Negative for weakness, altered level of consciousness , altered mental status, extremity weakness, burning feet, involuntary movement, seizure and syncope.  Psych: negative for anxiety, depression, insomnia, tearfulness, panic attacks, hallucinations, paranoia, suicidal or  homicidal ideation   Past Medical History  Diagnosis Date  . HTN (hypertension)   . DM (diabetes mellitus) (HCC)   . Anxiety   . Depression   . Obesity   . Colon polyps   . Complication of anesthesia   . PONV (postoperative nausea and vomiting)   . Agoraphobia with panic disorder   . Chronic pain 03/08/15    Hydrocodone-APAP 10/325 mg, # 120 q month  . Stroke [facility])     Past Surgical History  Procedure Laterality Date  . Cardiac catheterization  2004    normal  . Abdominal hysterectomy  15 yrs ago    with bso at aph  . Cholecystectomy  25 yrs ago    [facility]  . Colonoscopy  05/04/11    left-sided colonic diverticulosis  . Esophagogastroduodenoscopy  05/04/2011    Abnormal distal esophagus, biopsies consistent with inflammation due to acid reflux. No evidence of Barrett's. No H. pylori  . Radiology with anesthesia N/A 08/20/2015    Procedure: RADIOLOGY WITH ANESTHESIA;  Surgeon: Julieanne Cotton, MD;  Location[facility] OR;  Service: Radiology;  Laterality: N/A;    Medications:  HOME MEDS: Prior to Admission medications   Medication Sig Start Date End Date Taking? Authorizing Provider  apixaban (ELIQUIS) 5 MG TABS tablet Take 1 tablet (5 mg total) by mouth 2 (two) times daily. 09/01/15  Yes Layne Benton, NP  atorvastatin (LIPITOR) 20 MG tablet Take 20 mg by mouth at bedtime.    Yes Historical Provider, MD  Cholecalciferol (VITAMIN D-3 PO) Take 1 tablet by mouth daily.   Yes Historical Provider, MD  enalapril (VASOTEC) 20 MG tablet Take 20 mg by mouth 2 (two) times daily.    Yes Historical Provider, MD  glipiZIDE (GLUCOTROL) 10 MG tablet  Take 10 mg by mouth every morning. In the AM   Yes Historical Provider, MD  hydrochlorothiazide (HYDRODIURIL) 25 MG tablet Take 25 mg by mouth daily.     Yes Historical Provider, MD  HYDROcodone-acetaminophen (NORCO) 10-325 MG tablet Take 1 tablet by mouth every 6 (six) hours as needed for moderate pain. 12/24/15  Yes Lonia Skinner Sofia, PA-C  levothyroxine  (SYNTHROID, LEVOTHROID) 25 MCG tablet Take 25 mcg by mouth daily. 07/14/15  Yes Historical Provider, MD  metFORMIN (GLUCOPHAGE) 500 MG tablet Take 1,000 mg by mouth 2 (two) times daily with a meal.    Yes Historical Provider, MD  metoprolol (TOPROL-XL) 50 MG 24 hr tablet Take 50 mg by mouth daily with lunch.    Yes Historical Provider, MD  pioglitazone (ACTOS) 15 MG tablet Take 15 mg by mouth daily.   Yes Historical Provider, MD     Allergies:  Allergies  Allergen Reactions  . Naproxen Other (See Comments)    Causes stomach burning    Social History:   reports that she quit smoking about 44 years ago. Her smoking use included Cigarettes. She started smoking about 46 years ago. She has a 3.75 pack-year smoking history. Her smokeless tobacco use includes Snuff. She reports that she does not drink alcohol or use illicit drugs.  Family History: Family History  Problem Relation Age of Onset  . Colon cancer Paternal Aunt     2, colon cancer  . Colon cancer Paternal Uncle     6, colon cancer  . Colon cancer Father     deceased, age 58  . Colon cancer      numerous cousins died before age 5 with colon cancer  . Colon cancer Sister     deceased, age 21  . Anesthesia problems Neg Hx   . Hypotension Neg Hx   . Malignant hyperthermia Neg Hx   . Pseudochol deficiency Neg Hx      Physical Exam: Filed Vitals:   01/22/16 0000 01/22/16 0400 01/22/16 0700 01/22/16 0800  BP:   131/79   Pulse:   62   Temp: 97.1 F (36.2 C) 97.1 F (36.2 C)  98.1 F (36.7 C)  TempSrc: Oral Oral  Oral  Resp:   18   Height:      Weight:  95.9 kg (211 lb 6.7 oz)    SpO2:   96%    Blood pressure 131/79, pulse 62, temperature 98.1 F (36.7 C), temperature source Oral, resp. rate 18, height  (1.575 m), weight 95.9 kg (211 lb 6.7 oz), SpO2 96 %.  GEN:  Pleasant  patient lying in the stretcher in no acute distress; cooperative with exam. PSYCH:  alert and oriented x4; does not appear anxious or  depressed; affect is appropriate. HEENT: Mucous membranes pink and anicteric; PERRLA; EOM intact; no cervical lymphadenopathy nor thyromegaly or carotid bruit; no JVD; There were no stridor. Neck is very supple. Breasts:: Not examined CHEST WALL: No tenderness CHEST: Normal respiration, clear to auscultation bilaterally.  HEART: Regular rate and rhythm.  There are no murmur, rub, or gallops.   BACK: No kyphosis or scoliosis; no CVA tenderness ABDOMEN: soft and non-tender; no masses, no organomegaly, normal abdominal bowel sounds; no pannus; no intertriginous candida. There is no rebound and no distention. Rectal Exam: Not done EXTREMITIES: No bone or joint deformity; age-appropriate arthropathy of the hands and knees; no edema; no ulcerations.  There is no calf tenderness. Genitalia: not examined PULSES: 2+ and symmetric SKIN:  Normal hydration no rash or ulceration CNS: Cranial nerves 2-12 grossly intact no focal lateralizing neurologic deficit.  Speech is fluent; uvula elevated with phonation, facial symmetry and tongue midline. DTR are normal bilaterally, cerebella exam is intact, barbinski is negative and strengths are equaled bilaterally.  No sensory loss.   Labs on Admission:  Basic Metabolic Panel:  Recent Labs Lab 01/20/16 1925 01/21/16 0820  NA 135 136  K 4.6 4.2  CL 102 105  CO2 26 23  GLUCOSE 94 79  BUN 34* 32*  CREATININE 1.46* 1.31*  CALCIUM 8.7* 8.8*   Liver Function Tests:  Recent Labs Lab 01/20/16 1925  AST 15  ALT 10*  ALKPHOS 73  BILITOT 0.9  PROT 6.7  ALBUMIN 3.3*   CBC:  Recent Labs Lab 01/20/16 1925 01/21/16 0820 01/21/16 1524 01/21/16 2045 01/22/16 0301  WBC 6.5 6.8 7.1 6.7 6.6  NEUTROABS 5.2  --   --   --   --   HGB 7.2* 10.2* 9.7* 9.4* 9.4*  HCT 22.0* 30.7* 28.8* 28.2* 28.3*  MCV 99.1 93.3 92.3 92.5 92.5  PLT 237 206 212 227 228   Cardiac Enzymes:  Recent Labs Lab 01/20/16 1925  TROPONINI <0.03    CBG:  Recent Labs Lab  01/21/16 1539 01/21/16 2046 01/21/16 2351 01/22/16 0406 01/22/16 0731  GLUCAP 193* 216* 209* 193* 188*     Radiological Exams on Admission: Dg Chest Port 1 View  01/20/2016  CLINICAL DATA:  Sepsis EXAM: PORTABLE CHEST 1 VIEW COMPARISON:  12/13/2009 FINDINGS: The heart size and mediastinal contours are within normal limits. Both lungs are clear. The visualized skeletal structures are unremarkable. IMPRESSION: No active disease. Electronically Signed   By: Elige KoHetal  Patel   On: 01/20/2016 19:50    EKG: Independently reviewed.    Assessment/Plan Present on Admission:  . Hypoglycemia . Upper GI bleeding . Chronic pain . Benign essential HTN . Anxiety and depression . Blood loss anemia  PLAN:  GI Bleed: Suspect UGIB from ulcer with NSAIDS use and Eliquis. Stopped both. Plan is for EGD today. Continue with PPI drip.   HTN: Anti HTN held, except betablocker.  Chronic pain syndrome: PRN morphine. No NSAIDS.   DM: SSI. She was hypoglycemic on presentation.   Other plans as per orders. Code Status: FULL Unk LightningODE.    Janele Lague, MD.  FACP [facility] Pager [phone] 7pm to 7am.  01/22/2016, 9:09 AM

## 2016-01-22 NOTE — Op Note (Signed)
[facility] Patient Name: [place] Procedure Date: 01/22/2016 9:50 AM MRN: [id number] Date of Birth: 1950-11-25 Attending MD: Lionel December , MD CSN: [id number] Age: 65 Admit Type: Inpatient Procedure:                Upper GI endoscopy Indications:              Acute post hemorrhagic anemia, Melena Providers:                Lionel December, MD, Nena Polio, RN, Lollie Marrow.                            Val Eagle, Technician Referring MD:             Houston Siren, MD Medicines:                Cetacaine spray, Meperidine 50 mg IV, Midazolam 2                            mg IV Complications:            No immediate complications. Estimated Blood Loss:     Estimated blood loss was minimal. Procedure:                Pre-Anesthesia Assessment:                           - Prior to the procedure, a History and Physical                            was performed, and patient medications and                            allergies were reviewed. [name] tolerance of                            previous anesthesia was also reviewed. The risks                            and benefits of the procedure and the sedation                            options and risks were discussed with the patient.                            All questions were answered, and informed consent                            was obtained. Prior Anticoagulants: The patient                            last took Eliquis (apixaban) 2 days and naproxen 2                            days prior to the procedure. ASA Grade Assessment:  III - A patient with severe systemic disease. After                            reviewing the risks and benefits, the patient was                            deemed in satisfactory condition to undergo the                            procedure.                           After obtaining informed consent, the endoscope was                            passed under direct vision.  Throughout the                            procedure, the patient's blood pressure, pulse, and                            oxygen saturations were monitored continuously. The                            [id number] ([id number]) scope was introduced through the                            mouth, and advanced to the second part of duodenum.                            The upper GI endoscopy was accomplished without                            difficulty. The patient tolerated the procedure                            well. Scope In: 10:11:56 AM Scope Out: 10:19:48 AM Total Procedure Duration: 0 hours 7 minutes 52 seconds  Findings:      The examined esophagus was normal.      The Z-line was irregular and was found 40 cm from the incisors.      Multiple dispersed, non-bleeding erosions were found in the gastric       antrum. There were no stigmata of recent bleeding.      Four non-bleeding gastric ulcers with no stigmata of bleeding were found       in the gastric antrum. The largest lesion was 5 mm in largest dimension.      One oozing cratered gastric ulcer with adherent clot was found in the       prepyloric region of the stomach. The lesion was 6 mm in largest       dimension. Coagulation for hemostasis using heater probe was successful.      The exam of the stomach was otherwise normal.      The duodenal bulb and second portion of the duodenum were normal. Impression:               -  Normal esophagus.                           - Z-line irregular, 40 cm from the incisors.                           - Non-bleeding erosive gastropathy.                           - Non-bleeding gastric ulcers with no stigmata of                            bleeding.                           - Oozing gastric ulcer with adherent clot. Treated                            with a heater probe.                           - Normal duodenal bulb and second portion of the                            duodenum.                            - No specimens collected. Moderate Sedation:      Moderate (conscious) sedation was administered by the endoscopy nurse       and supervised by the endoscopist. The following parameters were       monitored: oxygen saturation, heart rate, blood pressure, CO2       capnography and response to care. Total physician intraservice time was       19 minutes. Recommendation:           - Return patient to ICU for ongoing care.                           - Diabetic (ADA) diet today.                           - Continue present medications.                           - Perform an H. pylori serology today.                           - Use sucralfate tablets 1 gram PO QID                           - Continue Pantoprazole infusion for another 24                            hours.                           - Repeat EGD in 10 weeks. Procedure Code(s):        ---  Professional ---                           [id number], Esophagogastroduodenoscopy, flexible,                            transoral; with control of bleeding, any method                           99152, Moderate sedation services provided by the                            same physician or other qualified health care                            professional performing the diagnostic or                            therapeutic service that the sedation supports,                            requiring the presence of an independent trained                            observer to assist in the monitoring of the                            patient's level of consciousness and physiological                            status; initial 15 minutes of intraservice time,                            patient age 44 years or older Diagnosis Code(s):        --- Professional ---                           K22.8, Other specified diseases of esophagus                           K31.89, Other diseases of stomach and duodenum                           K25.9, Gastric ulcer,  unspecified as acute or                            chronic, without hemorrhage or perforation                           K25.4, Chronic or unspecified gastric ulcer with                            hemorrhage                           D62, Acute posthemorrhagic anemia  K92.1, Melena (includes Hematochezia) CPT copyright 2016 [employer]. All rights reserved. The codes documented in this report are preliminary and upon coder review may  be revised to meet current compliance requirements. Lionel December, MD Lionel December, MD 01/22/2016 10:37:02 AM This report has been signed electronically. Number of Addenda: 0

## 2016-01-23 LAB — GLUCOSE, CAPILLARY
GLUCOSE-CAPILLARY: 153 mg/dL — AB (ref 65–99)
GLUCOSE-CAPILLARY: 152 mg/dL — AB (ref 65–99)
Glucose-Capillary: 122 mg/dL — ABNORMAL HIGH (ref 65–99)
Glucose-Capillary: 108 mg/dL — ABNORMAL HIGH (ref 65–99)

## 2016-01-23 LAB — CBC
HCT: 26.1 % — ABNORMAL LOW (ref 36.0–46.0)
Hemoglobin: 8.5 g/dL — ABNORMAL LOW (ref 12.0–15.0)
MCH: 31 pg (ref 26.0–34.0)
MCHC: 32.6 g/dL (ref 30.0–36.0)
MCV: 95.3 fL (ref 78.0–100.0)
PLATELETS: 201 10*3/uL (ref 150–400)
RBC: 2.74 MIL/uL — AB (ref 3.87–5.11)
RDW: 17.7 % — AB (ref 11.5–15.5)
WBC: 6.8 10*3/uL (ref 4.0–10.5)

## 2016-01-23 MED ORDER — PANTOPRAZOLE SODIUM 40 MG PO TBEC
40.0000 mg | DELAYED_RELEASE_TABLET | Freq: Two times a day (BID) | ORAL | Status: DC
  Administered 2016-01-23 – 2016-01-24 (×2): 40 mg via ORAL
  Filled 2016-01-23 (×2): qty 1

## 2016-01-23 MED ORDER — PANTOPRAZOLE SODIUM 40 MG PO TBEC
40.0000 mg | DELAYED_RELEASE_TABLET | Freq: Every day | ORAL | Status: DC
  Administered 2016-01-23: 40 mg via ORAL
  Filled 2016-01-23: qty 1

## 2016-01-23 MED ORDER — ENALAPRIL MALEATE 5 MG PO TABS
10.0000 mg | ORAL_TABLET | Freq: Every day | ORAL | Status: DC
  Administered 2016-01-23 – 2016-01-24 (×2): 10 mg via ORAL
  Filled 2016-01-23 (×2): qty 2

## 2016-01-23 NOTE — Progress Notes (Addendum)
  Subjective:  Patient has no complaints. She denies nausea vomiting abdominal pain. She has good appetite. She has not had a BM in last 24 hours. Patient states she has been getting physical therapy at home.  Objective: Blood pressure 147/78, pulse 71, temperature 97.2 F (36.2 C), temperature source Oral, resp. rate 23, height 5\' 2"  (1.575 m), weight 210 lb 8.6 oz (95.5 kg), SpO2 100 %. Patient is alert. She is sitting in recliner appears to be comfortable. She has left facial weakness. Cardiac exam with regular rhythm normal S1 and S2. No murmur or gallop noted. Lungs are clear to auscultation. Abdomen is full but soft and nontender.  Labs/studies Results:   Recent Labs  01/21/16 2045 01/22/16 0301 01/23/16 0449  WBC 6.7 6.6 6.8  HGB 9.4* 9.4* 8.5*  HCT 28.2* 28.3* 26.1*  PLT 227 228 201    BMET   Recent Labs  01/20/16 1925 01/21/16 0820  NA 135 136  K 4.6 4.2  CL 102 105  CO2 26 23  GLUCOSE 94 79  BUN 34* 32*  CREATININE 1.46* 1.31*  CALCIUM 8.7* 8.8*    LFT   Recent Labs  01/20/16 1925  PROT 6.7  ALBUMIN 3.3*  AST 15  ALT 10*  ALKPHOS 73  BILITOT 0.9    PT/INR   Recent Labs  01/20/16 1925  LABPROT 18.1*  INR 1.49    H pylori serology is pending  Assessment:  #1. Upper GI bleed secondary to prepyloric/gastric ulcer. Status post therapeutic EGD 24 hours ago. Hemoglobin has dropped by 0.9 g. This drop is felt to be due to hemodilution. No evidence of recurrent GI bleed. Gastric ulcers felt to be secondary to chronic NSAID therapy. H. pylori serology is pending. #2. Anemia secondary to upper GI bleed. Patient has received 2 units of PRBCs.  #3. 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  Recommendations:  CBC and metabolic 7 in a.m. Anticoagulant can be resumed in 2 days. Pantoprazole changed to 40 mg by mouth twice a day. EGD and 10 weeks or so to document complete healing of gastric ulcers.

## 2016-01-23 NOTE — Progress Notes (Signed)
[facility] PROGRESS NOTE  Megan [name] ZOX:[id number] DOB: [birth year]    PCP:   Milana Obey, MD   HPI:  Megan [name] is an 65 y.o. female with hx of HTN, DM, afib and prior CVA on Eliquis, chronic pain, admitted for upper GI bleed with Hb of 7g per dL and was in mild shock (hypotensive, hypothermic...) She had fallen a week prior, and had facial Fx. She is on pain medication. She was transfused 2 units of PRBCs, and GI has been consulted. She has been taking Aleve. Her Hb rose to 10g per dL, and stable at 8.5 g/dL.  EGD showed gastric ulcer, and was heat coagulated.  No further active bleeding.  She is eating carb modified diet.   Rewiew of Systems:  Constitutional: Negative for malaise, fever and chills. No significant weight loss or weight gain Eyes: Negative for eye pain, redness and discharge, diplopia, visual changes, or flashes of light. ENMT: Negative for ear pain, hoarseness, nasal congestion, sinus pressure and sore throat. No headaches; tinnitus, drooling, or problem swallowing. Cardiovascular: Negative for chest pain, palpitations, diaphoresis, dyspnea and peripheral edema. ; No orthopnea, PND Respiratory: Negative for cough, hemoptysis, wheezing and stridor. No pleuritic chestpain. Gastrointestinal: Negative for nausea, vomiting, diarrhea, constipation, abdominal pain, melena, blood in stool, hematemesis, jaundice and rectal bleeding.    Genitourinary: Negative for frequency, dysuria, incontinence,flank pain and hematuria; Musculoskeletal: Negative for back pain and neck pain. Negative for swelling and trauma.;  Skin: . Negative for pruritus, rash, abrasions, bruising and skin lesion.; ulcerations Neuro: Negative for headache, lightheadedness and neck stiffness. Negative for weakness, altered level of consciousness , altered mental status, extremity weakness, burning feet, involuntary movement, seizure and syncope.  Psych: negative for anxiety,  depression, insomnia, tearfulness, panic attacks, hallucinations, paranoia, suicidal or homicidal ideation    Past Medical History  Diagnosis Date  . HTN (hypertension)   . DM (diabetes mellitus) (HCC)   . Anxiety   . Depression   . Obesity   . Colon polyps   . Complication of anesthesia   . PONV (postoperative nausea and vomiting)   . Agoraphobia with panic disorder   . Chronic pain 03/08/15    Hydrocodone-APAP 10/325 mg, # 120 q month  . Stroke [facility])     Past Surgical History  Procedure Laterality Date  . Cardiac catheterization  2004    normal  . Abdominal hysterectomy  15 yrs ago    with bso at aph  . Cholecystectomy  25 yrs ago    [facility]  . Colonoscopy  05/04/11    left-sided colonic diverticulosis  . Esophagogastroduodenoscopy  05/04/2011    Abnormal distal esophagus, biopsies consistent with inflammation due to acid reflux. No evidence of Barrett's. No H. pylori  . Radiology with anesthesia N/A 08/20/2015    Procedure: RADIOLOGY WITH ANESTHESIA;  Surgeon: Julieanne Cotton, MD;  Location: [facility] OR;  Service: Radiology;  Laterality: N/A;    Medications:  HOME MEDS: Prior to Admission medications   Medication Sig Start Date End Date Taking? Authorizing Provider  apixaban (ELIQUIS) 5 MG TABS tablet Take 1 tablet (5 mg total) by mouth 2 (two) times daily. 09/01/15  Yes Layne Benton, NP  atorvastatin (LIPITOR) 20 MG tablet Take 20 mg by mouth at bedtime.    Yes Historical Provider, MD  Cholecalciferol (VITAMIN D-3 PO) Take 1 tablet by mouth daily.   Yes Historical Provider, MD  enalapril (VASOTEC) 20 MG tablet Take 20 mg by mouth 2 (two)  times daily.    Yes Historical Provider, MD  glipiZIDE (GLUCOTROL) 10 MG tablet Take 10 mg by mouth every morning. In the AM   Yes Historical Provider, MD  hydrochlorothiazide (HYDRODIURIL) 25 MG tablet Take 25 mg by mouth daily.     Yes Historical Provider, MD  HYDROcodone-acetaminophen (NORCO) 10-325 MG tablet Take 1 tablet by mouth every 6  (six) hours as needed for moderate pain. 12/24/15  Yes Lonia Skinner Sofia, PA-C  levothyroxine (SYNTHROID, LEVOTHROID) 25 MCG tablet Take 25 mcg by mouth daily. 07/14/15  Yes Historical Provider, MD  metFORMIN (GLUCOPHAGE) 500 MG tablet Take 1,000 mg by mouth 2 (two) times daily with a meal.    Yes Historical Provider, MD  metoprolol (TOPROL-XL) 50 MG 24 hr tablet Take 50 mg by mouth daily with lunch.    Yes Historical Provider, MD  pioglitazone (ACTOS) 15 MG tablet Take 15 mg by mouth daily.   Yes Historical Provider, MD     Allergies:  Allergies  Allergen Reactions  . Naproxen Other (See Comments)    Causes stomach burning    Social History:   reports that she quit smoking about 44 years ago. Her smoking use included Cigarettes. She started smoking about 46 years ago. She has a 3.75 pack-year smoking history. Her smokeless tobacco use includes Snuff. She reports that she does not drink alcohol or use illicit drugs.  Family History: Family History  Problem Relation Age of Onset  . Colon cancer Paternal Aunt     2, colon cancer  . Colon cancer Paternal Uncle     6, colon cancer  . Colon cancer Father     deceased, age 23  . Colon cancer      numerous cousins died before age 65 with colon cancer  . Colon cancer Sister     deceased, age 51  . Anesthesia problems Neg Hx   . Hypotension Neg Hx   . Malignant hyperthermia Neg Hx   . Pseudochol deficiency Neg Hx      Physical Exam: Filed Vitals:   01/23/16 0600 01/23/16 0700 01/23/16 0800 01/23/16 0900  BP: 164/98 177/95 165/153 147/78  Pulse: 64 59 67 71  Temp:   97.2 F (36.2 C)   TempSrc:   Oral   Resp: Height:      Weight:      SpO2: 95% 97% 100% 100%   Blood pressure 147/78, pulse 71, temperature 97.2 F (36.2 C), temperature source Oral, resp. rate 23, height  (1.575 m), weight 95.5 kg (210 lb 8.6 oz), SpO2 100 %.  GEN:  Pleasant  patient lying in the stretcher in no acute distress; cooperative with  exam. PSYCH:  alert and oriented x4; does not appear anxious or depressed; affect is appropriate. HEENT: Mucous membranes pink and anicteric; PERRLA; EOM intact; no cervical lymphadenopathy nor thyromegaly or carotid bruit; no JVD; There were no stridor. Neck is very supple. Breasts:: Not examined CHEST WALL: No tenderness CHEST: Normal respiration, clear to auscultation bilaterally.  HEART: Regular rate and rhythm.  There are no murmur, rub, or gallops.   BACK: No kyphosis or scoliosis; no CVA tenderness ABDOMEN: soft and non-tender; no masses, no organomegaly, normal abdominal bowel sounds; no pannus; no intertriginous candida. There is no rebound and no distention. Rectal Exam: Not done EXTREMITIES: No bone or joint deformity; age-appropriate arthropathy of the hands and knees; no edema; no ulcerations.  There is no calf tenderness. Genitalia: not examined PULSES:  2+ and symmetric SKIN: Normal hydration no rash or ulceration CNS: Cranial nerves 2-12 grossly intact no focal lateralizing neurologic deficit.  Speech is fluent; uvula elevated with phonation, facial symmetry and tongue midline. DTR are normal bilaterally, cerebella exam is intact, barbinski is negative and strengths are equaled bilaterally.  No sensory loss.   Labs on Admission:  Basic Metabolic Panel:  Recent Labs Lab 01/20/16 1925 01/21/16 0820  NA 135 136  K 4.6 4.2  CL 102 105  CO2 26 23  GLUCOSE 94 79  BUN 34* 32*  CREATININE 1.46* 1.31*  CALCIUM 8.7* 8.8*   Liver Function Tests:  Recent Labs Lab 01/20/16 1925  AST 15  ALT 10*  ALKPHOS 73  BILITOT 0.9  PROT 6.7  ALBUMIN 3.3*   CBC:  Recent Labs Lab 01/20/16 1925 01/21/16 0820 01/21/16 1524 01/21/16 2045 01/22/16 0301 01/23/16 0449  WBC 6.5 6.8 7.1 6.7 6.6 6.8  NEUTROABS 5.2  --   --   --   --   --   HGB 7.2* 10.2* 9.7* 9.4* 9.4* 8.5*  HCT 22.0* 30.7* 28.8* 28.2* 28.3* 26.1*  MCV 99.1 93.3 92.3 92.5 92.5 95.3  PLT 237 206 212 227 228 201    Cardiac Enzymes:  Recent Labs Lab 01/20/16 1925  TROPONINI <0.03    CBG:  Recent Labs Lab 01/22/16 0943 01/22/16 1119 01/22/16 1616 01/22/16 2059 01/23/16 0723  GLUCAP 159* 126* 138* 156* 153*   Assessment/Plan Present on Admission:  . Hypoglycemia . Upper GI bleeding . Chronic pain . Benign essential HTN . Anxiety and depression . Blood loss anemia  PLAN:   UGIB:  Gastric ulcer from Eliquis with NSAIDS.   Stable.  Will recheck H and H in am.  If stable, will d/c home Monday.  Resume Eliquis Tuesday OK per GI.  Will d/c PPI IV and give oral Protonix with Carafate.   HTN: Anti HTN held, except betablocker.  Chronic pain syndrome: PRN morphine. No NSAIDS.   DM: SSI. She was hypoglycemic on presentation.   Other plans as per orders. Code Status: FULL Unk LightningODE.    Megan Yeoman, MD.  FACP [facility] Pager [phone] 7pm to 7am.  01/23/2016, 10:11 AM

## 2016-01-24 ENCOUNTER — Telehealth: Payer: Self-pay | Admitting: Gastroenterology

## 2016-01-24 ENCOUNTER — Encounter: Payer: Self-pay | Admitting: Gastroenterology

## 2016-01-24 DIAGNOSIS — G8929 Other chronic pain: Secondary | ICD-10-CM

## 2016-01-24 LAB — CBC
HCT: 28.3 % — ABNORMAL LOW (ref 36.0–46.0)
Hemoglobin: 9.2 g/dL — ABNORMAL LOW (ref 12.0–15.0)
MCH: 30.4 pg (ref 26.0–34.0)
MCHC: 32.5 g/dL (ref 30.0–36.0)
MCV: 93.4 fL (ref 78.0–100.0)
PLATELETS: 203 10*3/uL (ref 150–400)
RBC: 3.03 MIL/uL — ABNORMAL LOW (ref 3.87–5.11)
RDW: 16.7 % — AB (ref 11.5–15.5)
WBC: 6.9 10*3/uL (ref 4.0–10.5)

## 2016-01-24 LAB — GLUCOSE, CAPILLARY: Glucose-Capillary: 117 mg/dL — ABNORMAL HIGH (ref 65–99)

## 2016-01-24 LAB — BASIC METABOLIC PANEL
ANION GAP: 5 (ref 5–15)
BUN: 10 mg/dL (ref 6–20)
CALCIUM: 8.8 mg/dL — AB (ref 8.9–10.3)
CO2: 23 mmol/L (ref 22–32)
CREATININE: 0.76 mg/dL (ref 0.44–1.00)
Chloride: 109 mmol/L (ref 101–111)
Glucose, Bld: 117 mg/dL — ABNORMAL HIGH (ref 65–99)
Potassium: 3.8 mmol/L (ref 3.5–5.1)
Sodium: 137 mmol/L (ref 135–145)

## 2016-01-24 LAB — HEMOGLOBIN A1C
Hgb A1c MFr Bld: 5.6 % (ref 4.8–5.6)
Mean Plasma Glucose: 114 mg/dL

## 2016-01-24 LAB — H. PYLORI ANTIBODY, IGG

## 2016-01-24 MED ORDER — PANTOPRAZOLE SODIUM 40 MG PO TBEC: 40.0000 mg | DELAYED_RELEASE_TABLET | Freq: Two times a day (BID) | ORAL | Status: DC

## 2016-01-24 NOTE — Progress Notes (Signed)
    Subjective: No abdominal pain, N/V. No overt GI bleeding. Wants to go home.   Objective: Vital signs in last 24 hours: Temp:  [97.2 F (36.2 C)-98.9 F (37.2 C)] 97.3 F (36.3 C) (07/10 0729) Pulse Rate:  [58-78] 71 (07/10 0600) Resp:  [19-30] 19 (07/10 0600) BP: (113-195)/(53-153) 125/53 mmHg (07/10 0600) SpO2:  [94 %-100 %] 100 % (07/10 0600) Weight:  [213 lb 6.5 oz (96.8 kg)] 213 lb 6.5 oz (96.8 kg) (07/10 0500) Last BM Date: 01/22/16 General:   Alert and oriented, pleasant Head:  Normocephalic and atraumatic. Abdomen:  Bowel sounds present, soft, non-tender, non-distended. No HSM or hernias noted. Sitting up in chair with limited exam.  Extremities:  Without  edema. Neurologic:  Alert and  oriented x4 Psych:  Alert and cooperative. Normal mood and affect.  Intake/Output from previous day: 07/09 0701 - 07/10 0700 In: 750 [P.O.:750] Out: 2320 [Urine:2320] Intake/Output this shift: Total I/O In: -  Out: 250 [Urine:250]  Lab Results:  Recent Labs  01/22/16 0301 01/23/16 0449 01/24/16 0410  WBC 6.6 6.8 6.9  HGB 9.4* 8.5* 9.2*  HCT 28.3* 26.1* 28.3*  PLT 228 201 203   BMET  Recent Labs  01/21/16 0820 01/24/16 0410  NA 136 137  K 4.2 3.8  CL 105 109  CO2 23 23  GLUCOSE 79 117*  BUN 32* 10  CREATININE 1.31* 0.76  CALCIUM 8.8* 8.8*    Assessment/Plan: 65 year old female with history of afib, s/p CVA in Feb 2017 on Eliquis who presented with GI bleed secondary to PUD noted on EGD. Will need repeat EGD in about 3 months. May resume Eliquis on 7/11. Appropriate from a GI standpoint to discharge home. We will arrange outpatient follow-up with our practice to arrange EGD. H.pylori serology remains pending.    Nira RetortAnna W. Sams, ANP-BC [facility]     LOS: 4 days    01/24/2016, 7:36 AM

## 2016-01-24 NOTE — Telephone Encounter (Signed)
APPT MADE AND LETTER SENT  °

## 2016-01-24 NOTE — Discharge Summary (Signed)
Physician Discharge Summary  Megan [name] ZOX:[id number] DOB: 10-04-1950 DOA: 01/20/2016  PCP: Milana Obey, MD  Admit date: 01/20/2016 Discharge date: 01/24/2016  Time spent: 35 minutes  Recommendations for Outpatient Follow-up:  1. Follow up with PCP in one week.  2. Follow up with Dr Ellison Carwin as scheduled for you.    Discharge Diagnoses:  Principal Problem:   Upper GI bleeding Active Problems:   Hypoglycemia   H/O: CVA (cerebrovascular accident)   Chronic pain   DM type 2 (diabetes mellitus, type 2) (HCC)   Benign essential HTN   Anxiety and depression   Blood loss anemia   Discharge Condition: No active bleeding, feeling well and Hb of 9.2 g per dL.   Diet recommendation: Carb modified diet.   Filed Weights   01/22/16 0400 01/23/16 0500 01/24/16 0500  Weight: 95.9 kg (211 lb 6.7 oz) 95.5 kg (210 lb 8.6 oz) 96.8 kg (213 lb 6.5 oz)    History of present illness: patient was admitted for UGIB on January 20, 2016.  As per her H and P:  "Rwanda A Lennartz is a 65 y.o. female with medical history significant of HTN, DM, CVA, anxiety, depression, and chronic pain presenting with an episode of unresponsiveness. Patient erports that she has been feeling weak all week. Reports that her legs were giving out on her and she couldn't get around or up and down, didn't have any strength. Tried to do some stuff this AM but too tired and weak. Laid down in recliner and went to sleep. Family found her, couldn't get her to wake up. No h'o similar episiodes. Dark tarry stools today and yesterday, very black. No n/v, no abdominal pain. Facial pain from broken bones, fell out of truck about 3 weeks ago. Has been taking hydrocodone for pain. Tingling in hands since stroke in Feb 2017.    ED Course: Patient reportedly hypothermic upon arrival so a baer hugger was placed; temps by ER nurse all WNL. Glucose 27, treated. Initially called code sepsis but nothing in work-up to indicate  sepsis. Normal lactate. Hypotension improved with IVF. Noted to be anemic, guaiac positive stools, started on Protonix drip and transfused 2 units prbc.  Review of Systems: As per HPI; otherwise 10 point review of systems reviewed and negative.    Hospital Course:  Patient was admitted into the ICU and was given 2 units of PRBC and IV PPI with bolus.  It was felt that she likely had a bleeding ulcer, being on Eliquis and taking Aleve.  She was seen in consultation with GI, and had EGD showing several bleeding gastric ulcer.  She was coagulated with heat, and her Hb was monitor for several days.  It remained stable and hovering around 9 g per dL.   She was subsequently restarted on Vasotec at  per day.  Her BP remained slightly elevated.  She is anxious to go home, and is stable for discharge.  GI recommended that she resume her Eliquis tomorrow.  She has an appointment to see Dr Ellison Carwin, her primary GI doctor, for follow up.  She was told to avoid all NSAIDS, and we went over all the names.  She will see her PCP next week as well.  Thank you for allowing me to partake in her care.  Good Day.    Procedures:  EGD.   Consultations:  GI:  Dr Kendell Bane.   Discharge Exam: Filed Vitals:   01/24/16 0729 01/24/16 0807  BP:  142/78  Pulse:    Temp: 97.3 F (36.3 C)   Resp:      Discharge Instructions   Discharge Instructions    Discharge instructions    Complete by:  As directed   Do not take NSAIDS (we discussed all those names), and restart your Eliquis tomorrow.  See Dr Ellison Carwinouke on your scheduled appointment.          Current Discharge Medication List    START taking these medications   Details  pantoprazole (PROTONIX) 40 MG tablet Take 1 tablet (40 mg total) by mouth 2 (two) times daily before a meal. Qty: 30 tablet, Refills: 3      CONTINUE these medications which have NOT CHANGED   Details  apixaban (ELIQUIS) 5 MG TABS tablet Take 1 tablet (5 mg total) by mouth 2 (two) times  daily. Qty: 60 tablet, Refills: 2    atorvastatin (LIPITOR) 20 MG tablet Take 20 mg by mouth at bedtime.     Cholecalciferol (VITAMIN D-3 PO) Take 1 tablet by mouth daily.    enalapril (VASOTEC) 20 MG tablet Take 20 mg by mouth 2 (two) times daily.     glipiZIDE (GLUCOTROL) 10 MG tablet Take 10 mg by mouth every morning. In the AM    hydrochlorothiazide (HYDRODIURIL) 25 MG tablet Take 25 mg by mouth daily.      HYDROcodone-acetaminophen (NORCO) 10-325 MG tablet Take 1 tablet by mouth every 6 (six) hours as needed for moderate pain. Qty: 20 tablet, Refills: 0    levothyroxine (SYNTHROID, LEVOTHROID) 25 MCG tablet Take 25 mcg by mouth daily.    metFORMIN (GLUCOPHAGE) 500 MG tablet Take 1,000 mg by mouth 2 (two) times daily with a meal.     metoprolol (TOPROL-XL) 50 MG 24 hr tablet Take 50 mg by mouth daily with lunch.     pioglitazone (ACTOS) 15 MG tablet Take 15 mg by mouth daily.       Allergies  Allergen Reactions  . Naproxen Other (See Comments)    Causes stomach burning      The results of significant diagnostics from this hospitalization (including imaging, microbiology, ancillary and laboratory) are listed below for reference.    Significant Diagnostic Studies: Dg Chest Port 1 View  01/20/2016  CLINICAL DATA:  Sepsis EXAM: PORTABLE CHEST 1 VIEW COMPARISON:  12/13/2009 FINDINGS: The heart size and mediastinal contours are within normal limits. Both lungs are clear. The visualized skeletal structures are unremarkable. IMPRESSION: No active disease. Electronically Signed   By: Elige KoHetal  Patel   On: 01/20/2016 19:50    Microbiology: Recent Results (from the past 240 hour(s))  Culture, blood (Routine X 2) w Reflex to ID Panel     Status: None (Preliminary result)   Collection Time: 01/20/16  7:25 PM  Result Value Ref Range Status   Specimen Description BLOOD RIGHT HAND  Final   Special Requests BOTTLES DRAWN AEROBIC AND ANAEROBIC 6CC  Final   Culture NO GROWTH 3 DAYS   Final   Report Status PENDING  Incomplete  Culture, blood (Routine X 2) w Reflex to ID Panel     Status: None (Preliminary result)   Collection Time: 01/20/16  7:34 PM  Result Value Ref Range Status   Specimen Description BLOOD LEFT ARM  Final   Special Requests BOTTLES DRAWN AEROBIC AND ANAEROBIC 6CC  Final   Culture NO GROWTH 3 DAYS  Final   Report Status PENDING  Incomplete  MRSA PCR Screening     Status: None  Collection Time: 01/20/16 10:55 PM  Result Value Ref Range Status   MRSA by PCR NEGATIVE NEGATIVE Final    Comment:        The GeneXpert MRSA Assay (FDA approved for NASAL specimens only), is one component of a comprehensive MRSA colonization surveillance program. It is not intended to diagnose MRSA infection nor to guide or monitor treatment for MRSA infections.      Labs: Basic Metabolic Panel:  Recent Labs Lab 01/20/16 1925 01/21/16 0820 01/24/16 0410  NA 135 136 137  K 4.6 4.2 3.8  CL 102 105 109  CO2 26 23 23   GLUCOSE 94 79 117*  BUN 34* 32* 10  CREATININE 1.46* 1.31* 0.76  CALCIUM 8.7* 8.8* 8.8*   Liver Function Tests:  Recent Labs Lab 01/20/16 1925  AST 15  ALT 10*  ALKPHOS 73  BILITOT 0.9  PROT 6.7  ALBUMIN 3.3*  CBC:  Recent Labs Lab 01/20/16 1925  01/21/16 1524 01/21/16 2045 01/22/16 0301 01/23/16 0449 01/24/16 0410  WBC 6.5  < > 7.1 6.7 6.6 6.8 6.9  NEUTROABS 5.2  --   --   --   --   --   --   HGB 7.2*  < > 9.7* 9.4* 9.4* 8.5* 9.2*  HCT 22.0*  < > 28.8* 28.2* 28.3* 26.1* 28.3*  MCV 99.1  < > 92.3 92.5 92.5 95.3 93.4  PLT 237  < > 212 227 228 201 203  < > = values in this interval not displayed. Cardiac Enzymes:  Recent Labs Lab 01/20/16 1925  TROPONINI <0.03   CBG:  Recent Labs Lab 01/23/16 0723 01/23/16 1115 01/23/16 1603 01/23/16 2107 01/24/16 0728  GLUCAP 153* 152* 122* 108* 117*   Signed:  Rikayla Demmon MD. Jerrel Ivory.  [facility] 01/24/2016, 9:11 AM

## 2016-01-24 NOTE — Progress Notes (Signed)
PT HAS BEEN VOIDING 200CC CLEAR YELLOW URINE APPROXIMENTLY Q30-45 MIN THIS SHIFT. BLADDER SCAN PREFORMED AND 188CC OBSERVED IN BLADDER. PT STATES SHE VOIDS LIKE THIS AT HOME EVERY NIGHT.

## 2016-01-24 NOTE — Progress Notes (Signed)
Alert and oriented. Vital signs stable. Saline lock removed. Telemetry D/C'ed. Discharge instructions given. Prescriptions given. Patient verbalized understanding of instructions. Left floor via wheelchair with nursing staff and family member.  Discharged home.  Heather RobertsGRAY, [place] Esch M 01/24/2016 11:49 AM

## 2016-01-24 NOTE — Telephone Encounter (Signed)
Please arrange outpatient follow-up with Verlon AuLeslie or myself in about 6 weeks; we will arrange outpatient EGD at that time for ulcer surveillance.   Nira RetortAnna W. Sams, ANP-BC [facility]

## 2016-01-25 ENCOUNTER — Observation Stay (HOSPITAL_COMMUNITY)
Admission: EM | Admit: 2016-01-25 | Discharge: 2016-01-27 | Disposition: A | Discharge status: Home / Self Care | Payer: Medicare HMO | Attending: Internal Medicine | Admitting: Internal Medicine

## 2016-01-25 ENCOUNTER — Encounter (HOSPITAL_COMMUNITY): Payer: Self-pay | Admitting: Emergency Medicine

## 2016-01-25 DIAGNOSIS — Z86010 Personal history of colonic polyps: Secondary | ICD-10-CM | POA: Insufficient documentation

## 2016-01-25 DIAGNOSIS — K922 Gastrointestinal hemorrhage, unspecified: Secondary | ICD-10-CM | POA: Diagnosis not present

## 2016-01-25 DIAGNOSIS — Z8719 Personal history of other diseases of the digestive system: Secondary | ICD-10-CM | POA: Insufficient documentation

## 2016-01-25 DIAGNOSIS — I10 Essential (primary) hypertension: Secondary | ICD-10-CM | POA: Diagnosis present

## 2016-01-25 DIAGNOSIS — I491 Atrial premature depolarization: Secondary | ICD-10-CM | POA: Diagnosis not present

## 2016-01-25 DIAGNOSIS — F329 Major depressive disorder, single episode, unspecified: Secondary | ICD-10-CM | POA: Insufficient documentation

## 2016-01-25 DIAGNOSIS — Z87891 Personal history of nicotine dependence: Secondary | ICD-10-CM | POA: Insufficient documentation

## 2016-01-25 DIAGNOSIS — E669 Obesity, unspecified: Secondary | ICD-10-CM | POA: Diagnosis not present

## 2016-01-25 DIAGNOSIS — Z8673 Personal history of transient ischemic attack (TIA), and cerebral infarction without residual deficits: Secondary | ICD-10-CM | POA: Diagnosis not present

## 2016-01-25 DIAGNOSIS — F419 Anxiety disorder, unspecified: Secondary | ICD-10-CM | POA: Insufficient documentation

## 2016-01-25 DIAGNOSIS — Z800 Family history of malignant neoplasm of digestive organs: Secondary | ICD-10-CM | POA: Diagnosis not present

## 2016-01-25 DIAGNOSIS — E785 Hyperlipidemia, unspecified: Secondary | ICD-10-CM | POA: Insufficient documentation

## 2016-01-25 DIAGNOSIS — Z79899 Other long term (current) drug therapy: Secondary | ICD-10-CM | POA: Insufficient documentation

## 2016-01-25 DIAGNOSIS — K625 Hemorrhage of anus and rectum: Secondary | ICD-10-CM | POA: Diagnosis present

## 2016-01-25 DIAGNOSIS — Z90710 Acquired absence of both cervix and uterus: Secondary | ICD-10-CM | POA: Diagnosis not present

## 2016-01-25 DIAGNOSIS — Z7901 Long term (current) use of anticoagulants: Secondary | ICD-10-CM | POA: Diagnosis not present

## 2016-01-25 DIAGNOSIS — G8929 Other chronic pain: Secondary | ICD-10-CM | POA: Diagnosis present

## 2016-01-25 DIAGNOSIS — E119 Type 2 diabetes mellitus without complications: Secondary | ICD-10-CM

## 2016-01-25 DIAGNOSIS — Z6837 Body mass index (BMI) 37.0-37.9, adult: Secondary | ICD-10-CM | POA: Diagnosis not present

## 2016-01-25 DIAGNOSIS — Z9049 Acquired absence of other specified parts of digestive tract: Secondary | ICD-10-CM | POA: Diagnosis not present

## 2016-01-25 DIAGNOSIS — I4891 Unspecified atrial fibrillation: Secondary | ICD-10-CM | POA: Insufficient documentation

## 2016-01-25 DIAGNOSIS — Z888 Allergy status to other drugs, medicaments and biological substances status: Secondary | ICD-10-CM | POA: Insufficient documentation

## 2016-01-25 DIAGNOSIS — E039 Hypothyroidism, unspecified: Secondary | ICD-10-CM | POA: Diagnosis not present

## 2016-01-25 DIAGNOSIS — K5730 Diverticulosis of large intestine without perforation or abscess without bleeding: Secondary | ICD-10-CM | POA: Insufficient documentation

## 2016-01-25 DIAGNOSIS — K6381 Dieulafoy lesion of intestine: Secondary | ICD-10-CM | POA: Diagnosis not present

## 2016-01-25 DIAGNOSIS — Z794 Long term (current) use of insulin: Secondary | ICD-10-CM | POA: Insufficient documentation

## 2016-01-25 DIAGNOSIS — K644 Residual hemorrhoidal skin tags: Secondary | ICD-10-CM | POA: Insufficient documentation

## 2016-01-25 LAB — CBC WITH DIFFERENTIAL/PLATELET
BASOS ABS: 0 10*3/uL (ref 0.0–0.1)
BASOS PCT: 0 %
Eosinophils Absolute: 0.1 10*3/uL (ref 0.0–0.7)
Eosinophils Relative: 2 %
HEMATOCRIT: 28.8 % — AB (ref 36.0–46.0)
HEMOGLOBIN: 9.6 g/dL — AB (ref 12.0–15.0)
Lymphocytes Relative: 11 %
Lymphs Abs: 0.8 10*3/uL (ref 0.7–4.0)
MCH: 30.8 pg (ref 26.0–34.0)
MCHC: 33.3 g/dL (ref 30.0–36.0)
MCV: 92.3 fL (ref 78.0–100.0)
Monocytes Absolute: 0.4 10*3/uL (ref 0.1–1.0)
Monocytes Relative: 5 %
NEUTROS ABS: 5.8 10*3/uL (ref 1.7–7.7)
NEUTROS PCT: 82 %
Platelets: 216 10*3/uL (ref 150–400)
RBC: 3.12 MIL/uL — AB (ref 3.87–5.11)
RDW: 16.1 % — ABNORMAL HIGH (ref 11.5–15.5)
WBC: 7.2 10*3/uL (ref 4.0–10.5)

## 2016-01-25 LAB — TYPE AND SCREEN
ABO/RH(D): O POS
Antibody Screen: NEGATIVE

## 2016-01-25 LAB — POC OCCULT BLOOD, ED: FECAL OCCULT BLD: POSITIVE — AB

## 2016-01-25 LAB — COMPREHENSIVE METABOLIC PANEL
ALBUMIN: 3.2 g/dL — AB (ref 3.5–5.0)
ALK PHOS: 88 U/L (ref 38–126)
ALT: 10 U/L — ABNORMAL LOW (ref 14–54)
AST: 14 U/L — AB (ref 15–41)
Anion gap: 5 (ref 5–15)
BILIRUBIN TOTAL: 0.7 mg/dL (ref 0.3–1.2)
BUN: 10 mg/dL (ref 6–20)
CALCIUM: 8.9 mg/dL (ref 8.9–10.3)
CO2: 26 mmol/L (ref 22–32)
Chloride: 107 mmol/L (ref 101–111)
Creatinine, Ser: 0.89 mg/dL (ref 0.44–1.00)
GFR calc Af Amer: >60 mL/min (ref >60)
GFR calc non Af Amer: >60 mL/min (ref >60)
GLUCOSE: 169 mg/dL — AB (ref 65–99)
Potassium: 3.2 mmol/L — ABNORMAL LOW (ref 3.5–5.1)
Sodium: 138 mmol/L (ref 135–145)
TOTAL PROTEIN: 6.8 g/dL (ref 6.5–8.1)

## 2016-01-25 LAB — GLUCOSE, CAPILLARY: Glucose-Capillary: 145 mg/dL — ABNORMAL HIGH (ref 65–99)

## 2016-01-25 LAB — CULTURE, BLOOD (ROUTINE X 2)
CULTURE: NO GROWTH
CULTURE: NO GROWTH

## 2016-01-25 LAB — HEMATOCRIT: HCT: 28.2 % — ABNORMAL LOW (ref 36.0–46.0)

## 2016-01-25 LAB — PROTIME-INR
INR: 1.26 (ref 0.00–1.49)
Prothrombin Time: 15.9 seconds — ABNORMAL HIGH (ref 11.6–15.2)

## 2016-01-25 LAB — HEMOGLOBIN: HEMOGLOBIN: 9.3 g/dL — AB (ref 12.0–15.0)

## 2016-01-25 LAB — TROPONIN I: Troponin I: <0.03 ng/mL (ref <0.03)

## 2016-01-25 MED ORDER — METOPROLOL SUCCINATE ER 50 MG PO TB24
50.0000 mg | ORAL_TABLET | Freq: Every day | ORAL | Status: DC
  Administered 2016-01-26 – 2016-01-27 (×2): 50 mg via ORAL
  Filled 2016-01-25 (×2): qty 1

## 2016-01-25 MED ORDER — LEVOTHYROXINE SODIUM 25 MCG PO TABS
25.0000 ug | ORAL_TABLET | Freq: Every day | ORAL | Status: DC
  Administered 2016-01-26 – 2016-01-27 (×2): 25 ug via ORAL
  Filled 2016-01-25 (×2): qty 1

## 2016-01-25 MED ORDER — HYDROCODONE-ACETAMINOPHEN 10-325 MG PO TABS
1.0000 | ORAL_TABLET | Freq: Four times a day (QID) | ORAL | Status: DC | PRN
  Administered 2016-01-25 – 2016-01-27 (×6): 1 via ORAL
  Filled 2016-01-25 (×6): qty 1

## 2016-01-25 MED ORDER — HYDROCHLOROTHIAZIDE 25 MG PO TABS
25.0000 mg | ORAL_TABLET | Freq: Every day | ORAL | Status: DC
Start: 2016-01-25 — End: 2016-01-27
  Administered 2016-01-25 – 2016-01-27 (×3): 25 mg via ORAL
  Filled 2016-01-25 (×3): qty 1

## 2016-01-25 MED ORDER — DIAZEPAM 5 MG PO TABS
10.0000 mg | ORAL_TABLET | Freq: Four times a day (QID) | ORAL | Status: DC | PRN
  Administered 2016-01-25 – 2016-01-26 (×2): 10 mg via ORAL
  Filled 2016-01-25 (×2): qty 2

## 2016-01-25 MED ORDER — PANTOPRAZOLE SODIUM 40 MG PO TBEC
40.0000 mg | DELAYED_RELEASE_TABLET | Freq: Two times a day (BID) | ORAL | Status: DC
  Administered 2016-01-25 – 2016-01-27 (×4): 40 mg via ORAL
  Filled 2016-01-25 (×4): qty 1

## 2016-01-25 MED ORDER — ATORVASTATIN CALCIUM 20 MG PO TABS
20.0000 mg | ORAL_TABLET | Freq: Every day | ORAL | Status: DC
  Administered 2016-01-25 – 2016-01-26 (×2): 20 mg via ORAL
  Filled 2016-01-25 (×2): qty 1

## 2016-01-25 MED ORDER — ENALAPRIL MALEATE 5 MG PO TABS
20.0000 mg | ORAL_TABLET | Freq: Two times a day (BID) | ORAL | Status: DC
  Administered 2016-01-25 – 2016-01-27 (×4): 20 mg via ORAL
  Filled 2016-01-25 (×4): qty 4

## 2016-01-25 NOTE — ED Notes (Signed)
Pt ambulatory to bathroom with assist.  Denies any complaints.

## 2016-01-25 NOTE — H&P (Signed)
[facility] History and Physical  Casimer LeekVirginia A Mumaw ZOX:[id number] DOB: 08/15/1950    PCP:   Milana ObeyStephen D Knowlton, MD   Chief Complaint: BRBPR on Eliquis.   HPI: [place] [name] is an 65 y.o. female whom I discharged yesterday for UGIB, with EGD by Dr Renae Fickleheman showed multiple ulcers Tx with heat coagulation, stable Hb for 3 days at 9 g per dL, started on Elquis yesterday as instructed, presented to ER with BRBPR likely from external hemmorrhoid, with no abdominal pain.  Her Hb remained stable, and her hemodynamics were good.  EDP spoke with Dr Ellison Carwinouke, who recommended OBS admit, and hold Eliquis.  Hospitalist was asked to admit her for same.   Rewiew of Systems:  Constitutional: Negative for malaise, fever and chills. No significant weight loss or weight gain Eyes: Negative for eye pain, redness and discharge, diplopia, visual changes, or flashes of light. ENMT: Negative for ear pain, hoarseness, nasal congestion, sinus pressure and sore throat. No headaches; tinnitus, drooling, or problem swallowing. Cardiovascular: Negative for chest pain, palpitations, diaphoresis, dyspnea and peripheral edema. ; No orthopnea, PND Respiratory: Negative for cough, hemoptysis, wheezing and stridor. No pleuritic chestpain. Gastrointestinal: Negative for nausea, vomiting, diarrhea, constipation, abdominal pain, melena, blood in stool, hematemesis, jaundice and rectal bleeding.    Genitourinary: Negative for frequency, dysuria, incontinence,flank pain and hematuria; Musculoskeletal: Negative for back pain and neck pain. Negative for swelling and trauma.;  Skin: . Negative for pruritus, rash, abrasions, bruising and skin lesion.; ulcerations Neuro: Negative for headache, lightheadedness and neck stiffness. Negative for weakness, altered level of consciousness , altered mental status, extremity weakness, burning feet, involuntary movement, seizure and syncope.  Psych: negative for anxiety, depression,  insomnia, tearfulness, panic attacks, hallucinations, paranoia, suicidal or homicidal ideation    Past Medical History  Diagnosis Date  . HTN (hypertension)   . DM (diabetes mellitus) (HCC)   . Anxiety   . Depression   . Obesity   . Colon polyps   . Complication of anesthesia   . PONV (postoperative nausea and vomiting)   . Agoraphobia with panic disorder   . Chronic pain 03/08/15    Hydrocodone-APAP 10/325 mg, # 120 q month  . Stroke [facility](HCC)     Past Surgical History  Procedure Laterality Date  . Cardiac catheterization  2004    normal  . Abdominal hysterectomy  15 yrs ago    with bso at aph  . Cholecystectomy  25 yrs ago    [facility]  . Colonoscopy  05/04/11    left-sided colonic diverticulosis  . Esophagogastroduodenoscopy  05/04/2011    Abnormal distal esophagus, biopsies consistent with inflammation due to acid reflux. No evidence of Barrett's. No H. pylori  . Radiology with anesthesia N/A 08/20/2015    Procedure: RADIOLOGY WITH ANESTHESIA;  Surgeon: Julieanne CottonSanjeev Deveshwar, MD;  [facility];  Service: Radiology;  Laterality: N/A;    Medications:  HOME MEDS: Prior to Admission medications   Medication Sig Start Date End Date Taking? Authorizing Provider  apixaban (ELIQUIS) 5 MG TABS tablet Take 1 tablet (5 mg total) by mouth 2 (two) times daily. 09/01/15  Yes Layne BentonSharon L Biby, NP  atorvastatin (LIPITOR) 20 MG tablet Take 20 mg by mouth at bedtime.    Yes Historical Provider, MD  Cholecalciferol (VITAMIN D-3 PO) Take 1 tablet by mouth daily.   Yes Historical Provider, MD  diazepam (VALIUM) 10 MG tablet Take 1 tablet by mouth at bedtime as needed for sleep.  12/10/15  Yes Historical Provider,  MD  enalapril (VASOTEC) 20 MG tablet Take 20 mg by mouth 2 (two) times daily.    Yes Historical Provider, MD  glipiZIDE (GLUCOTROL) 10 MG tablet Take 10 mg by mouth every morning. In the AM   Yes Historical Provider, MD  hydrochlorothiazide (HYDRODIURIL) 25 MG tablet Take 25 mg by mouth daily.      Yes Historical Provider, MD  HYDROcodone-acetaminophen (NORCO) 10-325 MG tablet Take 1 tablet by mouth every 6 (six) hours as needed for moderate pain. 12/24/15  Yes Lonia Skinner Sofia, PA-C  levothyroxine (SYNTHROID, LEVOTHROID) 25 MCG tablet Take 25 mcg by mouth daily. 07/14/15  Yes Historical Provider, MD  metFORMIN (GLUCOPHAGE) 500 MG tablet Take 1,000 mg by mouth 2 (two) times daily with a meal.    Yes Historical Provider, MD  metoprolol (TOPROL-XL) 50 MG 24 hr tablet Take 50 mg by mouth daily with lunch.    Yes Historical Provider, MD  pantoprazole (PROTONIX) 40 MG tablet Take 1 tablet (40 mg total) by mouth 2 (two) times daily before a meal. 01/24/16  Yes Houston Siren, MD  pioglitazone (ACTOS) 15 MG tablet Take 15 mg by mouth daily.   Yes Historical Provider, MD     Allergies:  Allergies  Allergen Reactions  . Naproxen Other (See Comments)    Causes stomach burning    Social History:   reports that she quit smoking about 44 years ago. Her smoking use included Cigarettes. She started smoking about 46 years ago. She has a 3.75 pack-year smoking history. Her smokeless tobacco use includes Snuff. She reports that she does not drink alcohol or use illicit drugs.  Family History: Family History  Problem Relation Age of Onset  . Colon cancer Paternal Aunt     2, colon cancer  . Colon cancer Paternal Uncle     6, colon cancer  . Colon cancer Father     deceased, age 63  . Colon cancer      numerous cousins died before age 14 with colon cancer  . Colon cancer Sister     deceased, age 33  . Anesthesia problems Neg Hx   . Hypotension Neg Hx   . Malignant hyperthermia Neg Hx   . Pseudochol deficiency Neg Hx      Physical Exam: Filed Vitals:   01/25/16 1230 01/25/16 1307 01/25/16 1330 01/25/16 1400  BP: 162/84 157/82 149/99 159/87  Pulse: 85 89 91 92  Resp: 20 18 22 19   SpO2: 93% 99% 97% 98%   Blood pressure 159/87, pulse 92, resp. rate 19, SpO2 98 %.  GEN:  Pleasant  patient lying  in the stretcher in no acute distress; cooperative with exam. PSYCH:  alert and oriented x4; does not appear anxious or depressed; affect is appropriate. HEENT: Mucous membranes pink and anicteric; PERRLA; EOM intact; no cervical lymphadenopathy nor thyromegaly or carotid bruit; no JVD; There were no stridor. Neck is very supple. Breasts:: Not examined CHEST WALL: No tenderness CHEST: Normal respiration, clear to auscultation bilaterally.  HEART: Regular rate and rhythm.  There are no murmur, rub, or gallops.   BACK: No kyphosis or scoliosis; no CVA tenderness ABDOMEN: soft and non-tender; no masses, no organomegaly, normal abdominal bowel sounds; no pannus; no intertriginous candida. There is no rebound and no distention. Rectal Exam: Not done EXTREMITIES: No bone or joint deformity; age-appropriate arthropathy of the hands and knees; no edema; no ulcerations.  There is no calf tenderness. Genitalia: not examined PULSES: 2+ and symmetric SKIN: Normal hydration  no rash or ulceration CNS: Cranial nerves 2-12 grossly intact no focal lateralizing neurologic deficit.  Speech is fluent; uvula elevated with phonation, facial symmetry and tongue midline. DTR are normal bilaterally, cerebella exam is intact, barbinski is negative and strengths are equaled bilaterally.  No sensory loss.   Labs on Admission:  Basic Metabolic Panel:  Recent Labs Lab 01/20/16 1925 01/21/16 0820 01/24/16 0410 01/25/16 1106  NA 135 136 137 138  K 4.6 4.2 3.8 3.2*  CL 102 105 109 107  CO2 GLUCOSE 94 79 117* 169*  BUN 34* 32* 10 10  CREATININE 1.46* 1.31* 0.76 0.89  CALCIUM 8.7* 8.8* 8.8* 8.9   Liver Function Tests:  Recent Labs Lab 01/20/16 1925 01/25/16 1106  AST 15 14*  ALT 10* 10*  ALKPHOS 73 88  BILITOT 0.9 0.7  PROT 6.7 6.8  ALBUMIN 3.3* 3.2*   CBC:  Recent Labs Lab 01/20/16 1925  01/21/16 2045 01/22/16 0301 01/23/16 0449 01/24/16 0410 01/25/16 1106  WBC 6.5  < > 6.7 6.6  6.8 6.9 7.2  NEUTROABS 5.2  --   --   --   --   --  5.8  HGB 7.2*  < > 9.4* 9.4* 8.5* 9.2* 9.6*  HCT 22.0*  < > 28.2* 28.3* 26.1* 28.3* 28.8*  MCV 99.1  < > 92.5 92.5 95.3 93.4 92.3  PLT 237  < > 227 228 201 203 216  < > = values in this interval not displayed. Cardiac Enzymes:  Recent Labs Lab 01/20/16 1925 01/25/16 1106  TROPONINI <0.03 <0.03    CBG:  Recent Labs Lab 01/23/16 0723 01/23/16 1115 01/23/16 1603 01/23/16 2107 01/24/16 0728  GLUCAP 153* 152* 122* 108* 117*   Assessment/Plan Present on Admission:  . Benign essential HTN . Upper GI bleeding . Chronic pain  PLAN:  BRBPR:  Unlikely to be related to her prior UGIB from NSAIDS use.  I think she has hemmorrhoidal bleed or diverticular bleed. Her Hb is the same as on discharge, and she has no abdominal pain.  Will d/c Eliquis, continue all her meds, including her antiHTN, and check her H and H every 12 hours.  If she is stable, she can likely be discharged tomorrow.   GI has been consulted.    Other plans as per orders. Code Status: FULL Unk Lightning, MD. FACP [facility] Pager [phone] 7pm to 7am.  01/25/2016, 2:36 PM

## 2016-01-25 NOTE — Progress Notes (Addendum)
    Subjective: Saw large amount of bright red blood this morning, then the second time she saw only a small amount of blood. Restarted Eliquis yesterday afternoon around 6pm. No abdominal pain. Per ED notes, non-bleeding, non-thrombosed external hemorrhoids. States her abdomen feels "full".   Objective: Vital signs in last 24 hours: Pulse Rate:  [83-89] 89 (07/11 1307) Resp:  [14-24] 18 (07/11 1307) BP: (152-165)/(82-92) 157/82 mmHg (07/11 1307) SpO2:  [93 %-100 %] 99 % (07/11 1307)   General:   Alert and oriented, pleasant Head:  Normocephalic and atraumatic. Eyes:  No icterus, sclera clear. Conjuctiva pink.  Heart:  S1, S2 present, irregularly irregular  Lungs: Clear to auscultation bilaterally Abdomen:  Bowel sounds present, soft, obese, non-tender, non-distended. No HSM or hernias noted. Msk:  Symmetrical without gross deformities. Normal posture. Extremities:  Without edema. Neurologic:  Alert and  oriented x4 Psych:  Alert and cooperative. Normal mood and affect.  Intake/Output from previous day:   Intake/Output this shift:    Lab Results:  Recent Labs  01/23/16 0449 01/24/16 0410 01/25/16 1106  WBC 6.8 6.9 7.2  HGB 8.5* 9.2* 9.6*  HCT 26.1* 28.3* 28.8*  PLT 201 203 216   BMET  Recent Labs  01/24/16 0410 01/25/16 1106  NA 137 138  K 3.8 3.2*  CL 109 107  CO2 23 26  GLUCOSE 117* 169*  BUN 10 10  CREATININE 0.76 0.89  CALCIUM 8.8* 8.9   LFT  Recent Labs  01/25/16 1106  PROT 6.8  ALBUMIN 3.2*  AST 14*  ALT 10*  ALKPHOS 88  BILITOT 0.7   PT/INR  Recent Labs  01/25/16 1106  LABPROT 15.9*  INR 1.26    Assessment: 65 year old female with history of afib, s/p CVA in Feb 2017 on Eliquis who presented with GI bleed recently to [facility] and underwent EGD, found to have PUD secondary to NSAIDs. She was discharged on 7/10 and resumed Eliquis yesterday evening. Now returning with hematochezia but does not appear to be hemodynamically  significant. Hgb stable. Her last colonoscopy was in 2012, and she has a family history of colon cancer (father at an early age, 240s). With her need for future anticoagulation due to history of stroke, would be prudent to consider lower GI tract evaluation prior to resuming anticoagulation long-term. Will observe for now and hold Eliquis. Anticipate colonoscopy on 7/13 with Dr. Jena Gaussourk.     Plan: May have clear liquids Hold Eliquis Serial H/H Anticipate colonoscopy 7/13  Nira RetortAnna W. Sams, ANP-BC [facility]       01/25/2016, 1:34 PM   Attending note:  Patient seen and examined in the ED. Agree with plans colonoscopy tentatively, 7/13.  Patient may not necessarily need a repeat EGD at this time.  Further recommendations to follow.

## 2016-01-25 NOTE — ED Provider Notes (Signed)
CSN: [id number]     Arrival date & time 01/25/16  1036 History  By signing my name below, I, [facility] Center[facility]arrissa Washington, attest that this documentation has been prepared under the direction and in the presence of Lavera Guiseana Duo Rayna Brenner, MD. Electronically Signed: Randell PatientMarrissa Washington, ED Scribe. 01/25/2016. 11:38 AM.    Chief Complaint  Patient presents with  . Rectal Bleeding    The history is provided by the patient. No language interpreter was used.   HPI Comments: Megan [name] is a 65 y.o. female with a hx of HTN, DM, colon polyps, and stroke who presents to the Emergency Department complaining of intermittent, moderate rectal bleeding onset earlier this morning. Pt states that she was discharged from the hospital yesterday after being treated for an upper GI bleed where she received blood transfusions and had a normal BMs prior to discharge. She notes that she has had two BM earlier today that was productive of bright red blood, the first greater in volume than the second. She reports associated fatigue, "heaviness" in her chest, and a HA (resolved) last night. Denies recent lower GI endoscopies. Denies taking ibuprofen or other NSAIDs. Denies vomiting, LOC, lightheadedness, abdominal pain, CP, diarrhea, or any other symptoms currently.  Past Medical History  Diagnosis Date  . HTN (hypertension)   . DM (diabetes mellitus) (HCC)   . Anxiety   . Depression   . Obesity   . Colon polyps   . Complication of anesthesia   . PONV (postoperative nausea and vomiting)   . Agoraphobia with panic disorder   . Chronic pain 03/08/15    Hydrocodone-APAP 10/325 mg, # 120 q month  . Stroke [facility](HCC)    Past Surgical History  Procedure Laterality Date  . Cardiac catheterization  2004    normal  . Abdominal hysterectomy  15 yrs ago    with bso at aph  . Cholecystectomy  25 yrs ago    [facility]  . Colonoscopy  05/04/11    left-sided colonic diverticulosis  . Esophagogastroduodenoscopy  05/04/2011    Abnormal  distal esophagus, biopsies consistent with inflammation due to acid reflux. No evidence of Barrett's. No H. pylori  . Radiology with anesthesia N/A 08/20/2015    Procedure: RADIOLOGY WITH ANESTHESIA;  Surgeon: Julieanne CottonSanjeev Deveshwar, MD;  [facility];  Service: Radiology;  Laterality: N/A;   Family History  Problem Relation Age of Onset  . Colon cancer Paternal Aunt     2, colon cancer  . Colon cancer Paternal Uncle     6, colon cancer  . Colon cancer Father     deceased, age 66  . Colon cancer      numerous cousins died before age 450 with colon cancer  . Colon cancer Sister     deceased, age 66  . Anesthesia problems Neg Hx   . Hypotension Neg Hx   . Malignant hyperthermia Neg Hx   . Pseudochol deficiency Neg Hx    Social History  Substance Use Topics  . Smoking status: Former Smoker -- 0.25 packs/day for 15 years    Types: Cigarettes    Start date: 07/17/1969    Quit date: 07/18/1971  . Smokeless tobacco: Current User    Types: Snuff     Comment: never more than 1-2 cig/day in entire life  . Alcohol Use: No   OB History    No data available     Review of Systems  Constitutional: Positive for fatigue.  Cardiovascular: Negative for chest pain.  Gastrointestinal: Positive for blood in stool. Negative for vomiting, abdominal pain and diarrhea.  Neurological: Positive for headaches (resolved). Negative for light-headedness.  All other systems reviewed and are negative.     Allergies  Naproxen  Home Medications   Prior to Admission medications   Medication Sig Start Date End Date Taking? Authorizing Provider  apixaban (ELIQUIS) 5 MG TABS tablet Take 1 tablet (5 mg total) by mouth 2 (two) times daily. 09/01/15  Yes Layne Benton, NP  atorvastatin (LIPITOR) 20 MG tablet Take 20 mg by mouth at bedtime.    Yes Historical Provider, MD  Cholecalciferol (VITAMIN D-3 PO) Take 1 tablet by mouth daily.   Yes Historical Provider, MD  diazepam (VALIUM) 10 MG tablet Take 1 tablet  by mouth at bedtime as needed for sleep.  12/10/15  Yes Historical Provider, MD  enalapril (VASOTEC) 20 MG tablet Take 20 mg by mouth 2 (two) times daily.    Yes Historical Provider, MD  glipiZIDE (GLUCOTROL) 10 MG tablet Take 10 mg by mouth every morning. In the AM   Yes Historical Provider, MD  hydrochlorothiazide (HYDRODIURIL) 25 MG tablet Take 25 mg by mouth daily.     Yes Historical Provider, MD  HYDROcodone-acetaminophen (NORCO) 10-325 MG tablet Take 1 tablet by mouth every 6 (six) hours as needed for moderate pain. 12/24/15  Yes Lonia Skinner Sofia, PA-C  levothyroxine (SYNTHROID, LEVOTHROID) 25 MCG tablet Take 25 mcg by mouth daily. 07/14/15  Yes Historical Provider, MD  metFORMIN (GLUCOPHAGE) 500 MG tablet Take 1,000 mg by mouth 2 (two) times daily with a meal.    Yes Historical Provider, MD  metoprolol (TOPROL-XL) 50 MG 24 hr tablet Take 50 mg by mouth daily with lunch.    Yes Historical Provider, MD  pantoprazole (PROTONIX) 40 MG tablet Take 1 tablet (40 mg total) by mouth 2 (two) times daily before a meal. 01/24/16  Yes Houston Siren, MD  pioglitazone (ACTOS) 15 MG tablet Take 15 mg by mouth daily.   Yes Historical Provider, MD   BP 174/96 mmHg  Pulse 72  Resp 16  SpO2 96% Physical Exam Physical Exam  Nursing note and vitals reviewed. Constitutional: Well developed, well nourished, non-toxic, and in no acute distress Head: Normocephalic and atraumatic.  Mouth/Throat: Oropharynx is clear and moist.  Neck: Normal range of motion. Neck supple.  Cardiovascular: Normal rate and regular rhythm.   Pulmonary/Chest: Effort normal and breath sounds normal.  Abdominal: Soft. There is no tenderness. There is no rebound and no guarding.  Genitourinary: Non-bleeding, non-thrombosed external hemorrhoids. Streaks of bright red blood on rectal exam. Musculoskeletal: Normal range of motion.  Neurological: Alert, no facial droop, fluent speech, moves all extremities symmetrically Skin: Skin is warm and dry.   Psychiatric: Cooperative  ED Course  Procedures (including critical care time)  DIAGNOSTIC STUDIES: Oxygen Saturation is 100% on RA, normal by my interpretation.    COORDINATION OF CARE: 11:18 AM Ordered labd.Discussed treatment plan with pt at bedside and pt agreed to plan.   Labs Review Labs Reviewed  CBC WITH DIFFERENTIAL/PLATELET - Abnormal; Notable for the following:    RBC 3.12 (*)    Hemoglobin 9.6 (*)    HCT 28.8 (*)    RDW 16.1 (*)    All other components within normal limits  COMPREHENSIVE METABOLIC PANEL - Abnormal; Notable for the following:    Potassium 3.2 (*)    Glucose, Bld 169 (*)    Albumin 3.2 (*)    AST 14 (*)  ALT 10 (*)    All other components within normal limits  PROTIME-INR - Abnormal; Notable for the following:    Prothrombin Time 15.9 (*)    All other components within normal limits  POC OCCULT BLOOD, ED - Abnormal; Notable for the following:    Fecal Occult Bld POSITIVE (*)    All other components within normal limits  TROPONIN I  TYPE AND SCREEN    Imaging Review No results found. I have personally reviewed and evaluated these images and lab results as part of my medical decision-making.   EKG Interpretation   Date/Time:  Tuesday January 25 2016 10:46:30 EDT Ventricular Rate:  93 PR Interval:    QRS Duration: 91 QT Interval:  364 QTC Calculation: 453 R Axis:   83 Text Interpretation:  Sinus rhythm Atrial premature complex Borderline  right axis deviation Minimal ST depression, anterolateral leads Confirmed  by Makayia Duplessis MD, Kailani Brass ([id number]) on 01/25/2016 10:51:08 AM      MDM   Final diagnoses:  Rectal bleeding   Recent UGIB from gastric ulcer on Eliquis who presents with rectal bright red bleeding. Vital signs stable on arrival, she is well appearing. Soft and benign abdomen. Rectal exam with streaks of blood and no stool. With hgb 9.6 (similar to discharge hgb one day ago). Normal BUN/Cr ratio. Less likely UGIB but high risk given  blood thinner and recent bleed. Also with diverticulosis and polyps on colonoscopy few years ago. Discussed with Dr. Jena Gauss from GI, who recommended observation overnight for serial hgb. Admitted to Dr. Conley Rolls for observation.   I personally performed the services described in this documentation, which was scribed in my presence. The recorded information has been reviewed and is accurate.   Lavera Guise, MD 01/25/16 1524

## 2016-01-25 NOTE — ED Notes (Signed)
Pt reports rectal bleeding that began this morning. States she had a BM and was "gushing blood." States she had a second BM with less blood in it. States she was d/c from ICU yesterday for bleeding ulcers and received multiple blood transfusions. Denies any abdominal pain.

## 2016-01-26 ENCOUNTER — Encounter (HOSPITAL_COMMUNITY): Payer: Self-pay | Admitting: Internal Medicine

## 2016-01-26 DIAGNOSIS — I10 Essential (primary) hypertension: Secondary | ICD-10-CM

## 2016-01-26 DIAGNOSIS — E118 Type 2 diabetes mellitus with unspecified complications: Secondary | ICD-10-CM

## 2016-01-26 DIAGNOSIS — G8929 Other chronic pain: Secondary | ICD-10-CM

## 2016-01-26 DIAGNOSIS — K922 Gastrointestinal hemorrhage, unspecified: Secondary | ICD-10-CM | POA: Diagnosis not present

## 2016-01-26 DIAGNOSIS — K625 Hemorrhage of anus and rectum: Secondary | ICD-10-CM | POA: Diagnosis not present

## 2016-01-26 LAB — GLUCOSE, CAPILLARY
GLUCOSE-CAPILLARY: 127 mg/dL — AB (ref 65–99)
GLUCOSE-CAPILLARY: 141 mg/dL — AB (ref 65–99)
Glucose-Capillary: 124 mg/dL — ABNORMAL HIGH (ref 65–99)
Glucose-Capillary: 128 mg/dL — ABNORMAL HIGH (ref 65–99)

## 2016-01-26 LAB — HEMOGLOBIN
HEMOGLOBIN: 9.5 g/dL — AB (ref 12.0–15.0)
Hemoglobin: 9.8 g/dL — ABNORMAL LOW (ref 12.0–15.0)

## 2016-01-26 LAB — HEMATOCRIT
HCT: 29.5 % — ABNORMAL LOW (ref 36.0–46.0)
HEMATOCRIT: 27.2 % — AB (ref 36.0–46.0)

## 2016-01-26 MED ORDER — BISACODYL 5 MG PO TBEC
10.0000 mg | DELAYED_RELEASE_TABLET | Freq: Two times a day (BID) | ORAL | Status: AC
  Administered 2016-01-26 (×2): 10 mg via ORAL
  Filled 2016-01-26 (×2): qty 2

## 2016-01-26 MED ORDER — PEG 3350-KCL-NA BICARB-NACL 420 G PO SOLR
2000.0000 mL | Freq: Once | ORAL | Status: AC
  Administered 2016-01-27: 2000 mL via ORAL

## 2016-01-26 MED ORDER — INSULIN ASPART 100 UNIT/ML ~~LOC~~ SOLN: 0.0000 [IU] | Freq: Three times a day (TID) | SUBCUTANEOUS | Status: DC

## 2016-01-26 MED ORDER — PROMETHAZINE HCL 25 MG/ML IJ SOLN: 12.5000 mg | INTRAMUSCULAR | Status: DC

## 2016-01-26 MED ORDER — PEG 3350-KCL-NA BICARB-NACL 420 G PO SOLR
2000.0000 mL | Freq: Once | ORAL | Status: AC
  Administered 2016-01-26: 2000 mL via ORAL
  Filled 2016-01-26: qty 4000

## 2016-01-26 MED ORDER — INSULIN ASPART 100 UNIT/ML ~~LOC~~ SOLN
0.0000 [IU] | Freq: Every day | SUBCUTANEOUS | Status: DC
Start: 2016-01-26 — End: 2016-01-27

## 2016-01-26 NOTE — Progress Notes (Signed)
  Subjective:  Patient denies further bleeding. No BMs since admission.No abdominal pain.   Objective: Vital signs in last 24 hours: Temp:  [98.6 F (37 C)] 98.6 F (37 C) (07/11 2029) Pulse Rate:  [72-95] 81 (07/11 2029) Resp:  [14-26] 20 (07/11 2029) BP: (149-182)/(76-99) 165/83 mmHg (07/11 2029) SpO2:  [93 %-100 %] 97 % (07/11 2029) Weight:  [203 lb (92.08 kg)] 203 lb (92.08 kg) (07/11 1719) Last BM Date: 01/25/16 General:   Alert,  Well-developed, well-nourished, pleasant and cooperative in NAD Head:  Normocephalic and atraumatic. Eyes:  Sclera clear, no icterus.  Abdomen:  Soft, nontender and nondistended.  Normal bowel sounds, without guarding, and without rebound.   Extremities:  Without clubbing, deformity or edema. Neurologic:  Alert and  oriented x4;  grossly normal neurologically. Skin:  Intact without significant lesions or rashes. Psych:  Alert and cooperative. Normal mood and affect.  Intake/Output from previous day: 07/11 0701 - 07/12 0700 In: -  Out: 700 [Urine:700] Intake/Output this shift: Total I/O In: -  Out: 800 [Urine:800]  Lab Results: CBC  Recent Labs  01/24/16 0410 01/25/16 1106 01/25/16 1908 01/26/16 0522  WBC 6.9 7.2  --   --   HGB 9.2* 9.6* 9.3* 9.5*  HCT 28.3* 28.8* 28.2* 27.2*  MCV 93.4 92.3  --   --   PLT 203 216  --   --    BMET  Recent Labs  01/24/16 0410 01/25/16 1106  NA 137 138  K 3.8 3.2*  CL 109 107  CO2 23 26  GLUCOSE 117* 169*  BUN 10 10  CREATININE 0.76 0.89  CALCIUM 8.8* 8.9   LFTs  Recent Labs  01/25/16 1106  BILITOT 0.7  ALKPHOS 88  AST 14*  ALT 10*  PROT 6.8  ALBUMIN 3.2*   No results for input(s): LIPASE in the last 72 hours. PT/INR  Recent Labs  01/25/16 1106  LABPROT 15.9*  INR 1.26      Imaging Studies: Dg Chest Port 1 View  01/20/2016  CLINICAL DATA:  Sepsis EXAM: PORTABLE CHEST 1 VIEW COMPARISON:  12/13/2009 FINDINGS: The heart size and mediastinal contours are within normal limits.  Both lungs are clear. The visualized skeletal structures are unremarkable. IMPRESSION: No active disease. Electronically Signed   By: Elige KoHetal  Patel   On: 01/20/2016 19:50  [2 weeks]   Assessment: 65 year old female with history of afib, s/p CVA in Feb 2017 on Eliquis who presented with GI bleed [facility] to [facility] and underwent EGD, found to have PUD secondary to NSAIDs. She was discharged on 7/10 and resumed Eliquis 7/10 evening. Now returning with hematochezia but does not appear to be hemodynamically significant. Hgb stable. Her last colonoscopy was in 2012, and she has a family history of colon cancer (father at an early age, 6840s). With her need for future anticoagulation due to history of stroke, would be prudent to consider lower GI tract evaluation prior to resuming anticoagulation long-term.   No further bleeding since admission. No BM since admission. Last dose of Eliquis on 7/101/17.  Plan: Colonoscopy 01/27/16. Outpatient EGD in 12 weeks.   Leanna BattlesLeslie S. Dixon BoosLewis, PA-C [facility] [phone] 7/12/20179:36 AM

## 2016-01-26 NOTE — Progress Notes (Signed)
PROGRESS NOTE    Megan LeekVirginia A Kratochvil  VWU:[id number] DOB: 03/20/1951 DOA: 01/25/2016 PCP: Milana ObeyStephen D Knowlton, MD  Outpatient Specialists:  Gastroenterology Eula Listenobert Rourk    Brief Narrative:  [age] yof with recent history of multiple ulcers that were treated with heat coagulation during hospitalization for UGIB one week ago. Patient was started on Eliquis on discharge 7/11. She presents now with complaints of bright red blood per rectum without abdominal pain. EDP spoke with Dr Ellison Carwinouke, who recommended OBS admit, and hold Eliquis. She has been admitted for further management.   Assessment & Plan:   Principal Problem:   BRBPR (bright red blood per rectum) Active Problems:   Upper GI bleeding   Chronic pain   DM type 2 (diabetes mellitus, type 2) (HCC)   Benign essential HTN  1. BRBPR, likely due to hemorrhoidal bleed or diverticular bleed. Patient noted to have recent UGIB secondary to NSAID use, but this is not thought to be the source. Eliquis held. Likely lower GI bleeding. Gastroenterology following plans for colonoscopy in a.m. Hemoglobin has remained stable. 2. Hypertension. Blood pressure currently stable. Currently on metoprolol and enalapril. 3. Diabetes. Holding outpatient oral agents. Start on sliding scale insulin. 4. History of atrial fibrillation. Anticoagulation is currently on hold. Heart rate is controlled. Patient recently had CVA in 08/2015 and was started on anticoagulation at that time. 5. Hyperlipidemia. Continue statin 6. Hypothyroidism. Continue Synthroid  DVT prophylaxis: scd Code Status: full code Family Communication: discussed with patient Disposition Plan: discharge home once improved   Consultants:   gastroenterology  Procedures:     Antimicrobials:      Subjective: No bowel movement since admission. No further rectal bleeding. No vomiting.  Objective: Filed Vitals:   01/25/16 1630 01/25/16 1719 01/25/16 1942 01/25/16 2029  BP: 174/97 157/85   165/83  Pulse: 95 87  81  Temp:    98.6 F (37 C)  TempSrc:    Oral  Resp: 19 20  20   Height:  5\' 2"  (1.575 m)    Weight:  92.08 kg (203 lb)    SpO2: 100% 98% 98% 97%    Intake/Output Summary (Last 24 hours) at 01/26/16 0851 Last data filed at 01/26/16 0737  Gross per 24 hour  Intake      0 ml  Output   1500 ml  Net  -1500 ml   Filed Weights   01/25/16 1719  Weight: 92.08 kg (203 lb)    Examination:  General exam: Appears calm and comfortable  Respiratory system: Clear to auscultation. Respiratory effort normal. Cardiovascular system: S1 & S2 heard, RRR. No JVD, murmurs, rubs, gallops or clicks. 1+ pedal edema. Gastrointestinal system: Abdomen is nondistended, soft and nontender. No organomegaly or masses felt. Normal bowel sounds heard. Central nervous system: Alert and oriented. No focal neurological deficits. Extremities: Symmetric 5 x 5 power. Skin: No rashes, lesions or ulcers Psychiatry: Judgement and insight appear normal. Mood & affect appropriate.     Data Reviewed: I have personally reviewed following labs and imaging studies  CBC:  Recent Labs Lab 01/20/16 1925  01/21/16 2045 01/22/16 0301 01/23/16 0449 01/24/16 0410 01/25/16 1106 01/25/16 1908 01/26/16 0522  WBC 6.5  < > 6.7 6.6 6.8 6.9 7.2  --   --   NEUTROABS 5.2  --   --   --   --   --  5.8  --   --   HGB 7.2*  < > 9.4* 9.4* 8.5* 9.2* 9.6* 9.3* 9.5*  HCT  22.0*  < > 28.2* 28.3* 26.1* 28.3* 28.8* 28.2* 27.2*  MCV 99.1  < > 92.5 92.5 95.3 93.4 92.3  --   --   PLT 237  < > 227 228 201 203 216  --   --   < > = values in this interval not displayed. Basic Metabolic Panel:  Recent Labs Lab 01/20/16 1925 01/21/16 0820 01/24/16 0410 01/25/16 1106  NA 135 136 137 138  K 4.6 4.2 3.8 3.2*  CL 102 105 109 107  CO2 26 23 23 26   GLUCOSE 94 79 117* 169*  BUN 34* 32* 10 10  CREATININE 1.46* 1.31* 0.76 0.89  CALCIUM 8.7* 8.8* 8.8* 8.9   GFR: Estimated Creatinine Clearance: 66.6 mL/min (by C-G  formula based on Cr of 0.89). Liver Function Tests:  Recent Labs Lab 01/20/16 1925 01/25/16 1106  AST 15 14*  ALT 10* 10*  ALKPHOS 73 88  BILITOT 0.9 0.7  PROT 6.7 6.8  ALBUMIN 3.3* 3.2*   No results for input(s): LIPASE, AMYLASE in the last 168 hours. No results for input(s): AMMONIA in the last 168 hours. Coagulation Profile:  Recent Labs Lab 01/20/16 1925 01/25/16 1106  INR 1.49 1.26   Cardiac Enzymes:  Recent Labs Lab 01/20/16 1925 01/25/16 1106  TROPONINI <0.03 <0.03   BNP (last 3 results) No results for input(s): PROBNP in the last 8760 hours. HbA1C: No results for input(s): HGBA1C in the last 72 hours. CBG:  Recent Labs Lab 01/23/16 1603 01/23/16 2107 01/24/16 0728 01/25/16 2045 01/26/16 0756  GLUCAP 122* 108* 117* 145* 127*   Lipid Profile: No results for input(s): CHOL, HDL, LDLCALC, TRIG, CHOLHDL, LDLDIRECT in the last 72 hours. Thyroid Function Tests: No results for input(s): TSH, T4TOTAL, FREET4, T3FREE, THYROIDAB in the last 72 hours. Anemia Panel: No results for input(s): VITAMINB12, FOLATE, FERRITIN, TIBC, IRON, RETICCTPCT in the last 72 hours. Urine analysis:    Component Value Date/Time   COLORURINE YELLOW 01/20/2016 1850   APPEARANCEUR CLEAR 01/20/2016 1850   LABSPEC >1.030* 01/20/2016 1850   PHURINE 5.0 01/20/2016 1850   GLUCOSEU NEGATIVE 01/20/2016 1850   HGBUR NEGATIVE 01/20/2016 1850   BILIRUBINUR NEGATIVE 01/20/2016 1850   KETONESUR TRACE* 01/20/2016 1850   PROTEINUR NEGATIVE 01/20/2016 1850   UROBILINOGEN 0.2 03/08/2015 0740   NITRITE NEGATIVE 01/20/2016 1850   LEUKOCYTESUR NEGATIVE 01/20/2016 1850   Sepsis Labs: @LABRCNTIP (procalcitonin:4,lacticidven:4)  ) Recent Results (from the past 240 hour(s))  Culture, blood (Routine X 2) w Reflex to ID Panel     Status: None   Collection Time: 01/20/16  7:25 PM  Result Value Ref Range Status   Specimen Description BLOOD RIGHT HAND  Final   Special Requests BOTTLES DRAWN  AEROBIC AND ANAEROBIC 6CC  Final   Culture NO GROWTH 5 DAYS  Final   Report Status 01/25/2016 FINAL  Final  Culture, blood (Routine X 2) w Reflex to ID Panel     Status: None   Collection Time: 01/20/16  7:34 PM  Result Value Ref Range Status   Specimen Description BLOOD LEFT ARM  Final   Special Requests BOTTLES DRAWN AEROBIC AND ANAEROBIC 6CC  Final   Culture NO GROWTH 5 DAYS  Final   Report Status 01/25/2016 FINAL  Final  MRSA PCR Screening     Status: None   Collection Time: 01/20/16 10:55 PM  Result Value Ref Range Status   MRSA by PCR NEGATIVE NEGATIVE Final    Comment:  The GeneXpert MRSA Assay (FDA approved for NASAL specimens only), is one component of a comprehensive MRSA colonization surveillance program. It is not intended to diagnose MRSA infection nor to guide or monitor treatment for MRSA infections.          Radiology Studies: No results found.      Scheduled Meds: . atorvastatin  20 mg Oral QHS  . enalapril  20 mg Oral BID  . hydrochlorothiazide  25 mg Oral Daily  . levothyroxine  25 mcg Oral QAC breakfast  . metoprolol succinate  50 mg Oral Q lunch  . pantoprazole  40 mg Oral BID AC   Continuous Infusions:       Time spent:    Erick Blinks, MD [facility] If 7PM-7AM, please contact night-coverage [email or web address] Password [facility] 01/26/2016, 8:51 AM

## 2016-01-26 NOTE — Care Management Obs Status (Signed)
MEDICARE OBSERVATION STATUS NOTIFICATION   Patient Details  Name: Megan LeekVirginia A Dueitt MRN: [id number] Date of Birth: 11/30/1950   Medicare Observation Status Notification Given:  Yes    Jylian Pappalardo, Chrystine OilerSharley Diane, RN 01/26/2016, 2:35 PM

## 2016-01-26 NOTE — Care Management Note (Signed)
Case Management Note  Patient Details  Name: Megan LeekVirginia A [name] MRN: [id number] Date of Birth: 08/08/1950  Subjective/Objective: Patient admitted from with rectal bleeding. She is ind with ADL's. Has a PCP and report no issues obtaining medications.                    Action/Plan: Anticipate DC home with self care. No CM needs.  Expected Discharge Date:                 01/27/2016 Expected Discharge Plan:  Home/Self Care  In-House Referral:     Discharge planning Services  CM Consult  Post Acute Care Choice:  NA Choice offered to:  NA  DME Arranged:    DME Agency:     HH Arranged:    HH Agency:     Status of Service:  In process, will continue to follow  If discussed at Long Length of Stay Meetings, dates discussed:    Additional Comments:  Crespin Forstrom, Chrystine OilerSharley Diane, RN 01/26/2016, 2:33 PM

## 2016-01-26 NOTE — Discharge Summary (Signed)
Physician Discharge Summary  Megan [name] UEA:[id number] DOB: 08-12-1950 DOA: 01/25/2016  PCP: Milana Obey, MD  Admit date: 01/25/2016 Discharge date: 01/27/2016  Admitted From: Home  Disposition:  Home   Recommendations for Outpatient Follow-up:  1. Follow up with PCP in 1-2 weeks 2. Please obtain BMP/CBC in one week 3. Follow-up with outpatient GI for outpatient EGD in 12 weeks.  4. No NSAIDs. Continue treatment for peptic ulcer disease 5. No MRI until clips known to be gone :  Home Health: No  Equipment/Devices: No  Discharge Condition: Stable CODE STATUS: Full Diet recommendation: Heart healthy, diabetic diet    Brief/Interim Summary: 30 yof with recent history of multiple ulcers that were treated with heat coagulation during hospitalization for UGIB one week ago. Patient was started on Eliquis on discharge 7/11. She presents now with complaints of bright red blood per rectum without abdominal pain. EDP spoke with Dr Ellison Carwin, who recommended OBS admit, and hold Eliquis. She has been admitted for further management.   Discharge Diagnoses:  Principal Problem:   BRBPR (bright red blood per rectum) Active Problems:   Upper GI bleeding   Chronic pain   DM type 2 (diabetes mellitus, type 2) (HCC)   Benign essential HTN   Diverticulosis of colon without hemorrhage  Patient presented with complaints of bright red blood per rectum with no noted abdominal pain. Although she was recently admitted for for UGIB most recent bleeding was felt to be related to hemorrhoidal or diverticular bleed. GI evaluated and performed colonoscopy 7/13 where she was noted to have diverticulosis in the sigmoid colon and in the descending colon. 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 Patient was instructed to follow up in three  Months with outpatient GI for EGD. On discharge she had no further reports of bleeding. Instructed to restart Eliquis 7/14. Hgb remained stable,  there was no need for transfusion during her hospitalization.  1. DM Type 2. Stable. Oral agents resumed on discharge. Started on SSI. 2. Hx of afib. Anticoagulation was on hold due to bleeding. Per GI restart 7/14. Heart rate remained controlled. Patient recently had CVA in 08/2015 and was started on anticoagulation at that time. 3. Essential HTN. Stable. Continue home medications.  4. Hypothyroidism. Continue synthroid.  5. Chronic pain. Stable.  Discharge Instructions     Medication List    TAKE these medications        apixaban 5 MG Tabs tablet  Commonly known as:  ELIQUIS  Take 1 tablet (5 mg total) by mouth 2 (two) times daily. Restart on 7/14     atorvastatin 20 MG tablet  Commonly known as:  LIPITOR  Take 20 mg by mouth at bedtime.     diazepam 10 MG tablet  Commonly known as:  VALIUM  Take 1 tablet by mouth at bedtime as needed for sleep.     enalapril 20 MG tablet  Commonly known as:  VASOTEC  Take 20 mg by mouth 2 (two) times daily.     glipiZIDE 10 MG tablet  Commonly known as:  GLUCOTROL  Take 10 mg by mouth every morning. In the AM     hydrochlorothiazide 25 MG tablet  Commonly known as:  HYDRODIURIL  Take 25 mg by mouth daily.     HYDROcodone-acetaminophen 10-325 MG tablet  Commonly known as:  NORCO  Take 1 tablet by mouth every 6 (six) hours as needed for moderate pain.     levothyroxine 25 MCG tablet  Commonly known as:  SYNTHROID, LEVOTHROID  Take 25 mcg by mouth daily.     metFORMIN 500 MG tablet  Commonly known as:  GLUCOPHAGE  Take 1,000 mg by mouth 2 (two) times daily with a meal.     metoprolol succinate 50 MG 24 hr tablet  Commonly known as:  TOPROL-XL  Take 50 mg by mouth daily with lunch.     pantoprazole 40 MG tablet  Commonly known as:  PROTONIX  Take 1 tablet (40 mg total) by mouth 2 (two) times daily before a meal.     pioglitazone 15 MG tablet  Commonly known as:  ACTOS  Take 15 mg by mouth daily.     VITAMIN D-3 PO  Take 1  tablet by mouth daily.       Follow-up Information    Follow up with Eula Listen, MD. Schedule an appointment as soon as possible for a visit in 3 months.   Specialty:  Gastroenterology   Why:  do not take any NSAIDs (advil, aleve, ibuprofen, motrin, goodys, BC powder)   Contact information:   [address] [place] [address] [phone]      Allergies  Allergen Reactions  . Naproxen Other (See Comments)    Causes stomach burning    Consultations:  Gastroenterology   Procedures/Studies: Dg Chest Port 1 View  01/20/2016  CLINICAL DATA:  Sepsis EXAM: PORTABLE CHEST 1 VIEW COMPARISON:  12/13/2009 FINDINGS: The heart size and mediastinal contours are within normal limits. Both lungs are clear. The visualized skeletal structures are unremarkable. IMPRESSION: No active disease. Electronically Signed   By: Elige Ko   On: 01/20/2016 19:50   Ileo-colonoscopy with bleeding control therapy   Subjective: Feeling well. No further bleeding. No abdominal pain   Discharge Exam: Filed Vitals:   01/27/16 0945 01/27/16 1236  BP: 234/213 132/61  Pulse: 103 72  Temp:    Resp: 10    Filed Vitals:   01/27/16 0935 01/27/16 0940 01/27/16 0945 01/27/16 1236  BP: 222/187 228/188 234/213 132/61  Pulse: 130 132 103 72  Temp:      TempSrc:      Resp: 17 10 10    Height:      Weight:      SpO2: 98% 98% 100%     General: Pt is alert, awake, not in acute distress Cardiovascular: RRR, S1/S2 +, no rubs, no gallops Respiratory: CTA bilaterally, no wheezing, no rhonchi Abdominal: Soft, NT, ND, bowel sounds + Extremities: no edema, no cyanosis    The results of significant diagnostics from this hospitalization (including imaging, microbiology, ancillary and laboratory) are listed below for reference.     Microbiology: Recent Results (from the past 240 hour(s))  Culture, blood (Routine X 2) w Reflex to ID Panel     Status: None   Collection Time: 01/20/16  7:25 PM  Result  Value Ref Range Status   Specimen Description BLOOD RIGHT HAND  Final   Special Requests BOTTLES DRAWN AEROBIC AND ANAEROBIC 6CC  Final   Culture NO GROWTH 5 DAYS  Final   Report Status 01/25/2016 FINAL  Final  Culture, blood (Routine X 2) w Reflex to ID Panel     Status: None   Collection Time: 01/20/16  7:34 PM  Result Value Ref Range Status   Specimen Description BLOOD LEFT ARM  Final   Special Requests BOTTLES DRAWN AEROBIC AND ANAEROBIC 6CC  Final   Culture NO GROWTH 5 DAYS  Final   Report Status  01/25/2016 FINAL  Final  MRSA PCR Screening     Status: None   Collection Time: 01/20/16 10:55 PM  Result Value Ref Range Status   MRSA by PCR NEGATIVE NEGATIVE Final    Comment:        The GeneXpert MRSA Assay (FDA approved for NASAL specimens only), is one component of a comprehensive MRSA colonization surveillance program. It is not intended to diagnose MRSA infection nor to guide or monitor treatment for MRSA infections.      Labs: BNP (last 3 results) No results for input(s): BNP in the last 8760 hours. Basic Metabolic Panel:  Recent Labs Lab 01/20/16 1925 01/21/16 0820 01/24/16 0410 01/25/16 1106 01/27/16 0512  NA 135 136 137 138 140  K 4.6 4.2 3.8 3.2* 3.0*  CL 102 105 109 107 102  CO2 GLUCOSE 94 79 117* 169* 113*  BUN 34* 32* CREATININE 1.46* 1.31* 0.76 0.89 0.79  CALCIUM 8.7* 8.8* 8.8* 8.9 9.0   Liver Function Tests:  Recent Labs Lab 01/20/16 1925 01/25/16 1106  AST 15 14*  ALT 10* 10*  ALKPHOS 73 88  BILITOT 0.9 0.7  PROT 6.7 6.8  ALBUMIN 3.3* 3.2*   No results for input(s): LIPASE, AMYLASE in the last 168 hours. No results for input(s): AMMONIA in the last 168 hours. CBC:  Recent Labs Lab 01/20/16 1925  01/22/16 0301 01/23/16 0449 01/24/16 0410 01/25/16 1106 01/25/16 1908 01/26/16 0522 01/26/16 1820 01/27/16 0512  WBC 6.5  < > 6.6 6.8 6.9 7.2  --   --   --  5.4  NEUTROABS 5.2  --   --   --   --  5.8  --    --   --   --   HGB 7.2*  < > 9.4* 8.5* 9.2* 9.6* 9.3* 9.5* 9.8* 9.6*  HCT 22.0*  < > 28.3* 26.1* 28.3* 28.8* 28.2* 27.2* 29.5* 29.0*  MCV 99.1  < > 92.5 95.3 93.4 92.3  --   --   --  92.4  PLT 237  < > 228 201 203 216  --   --   --  228  < > = values in this interval not displayed. Cardiac Enzymes:  Recent Labs Lab 01/20/16 1925 01/25/16 1106  TROPONINI <0.03 <0.03   BNP: Invalid input(s): POCBNP CBG:  Recent Labs Lab 01/26/16 1138 01/26/16 1617 01/26/16 2034 01/27/16 0758 01/27/16 1138  GLUCAP 141* 124* 128* 131* 123*   D-Dimer No results for input(s): DDIMER in the last 72 hours. Hgb A1c No results for input(s): HGBA1C in the last 72 hours. Lipid Profile No results for input(s): CHOL, HDL, LDLCALC, TRIG, CHOLHDL, LDLDIRECT in the last 72 hours. Thyroid function studies No results for input(s): TSH, T4TOTAL, T3FREE, THYROIDAB in the last 72 hours.  Invalid input(s): FREET3 Anemia work up No results for input(s): VITAMINB12, FOLATE, FERRITIN, TIBC, IRON, RETICCTPCT in the last 72 hours. Urinalysis    Component Value Date/Time   COLORURINE YELLOW 01/20/2016 1850   APPEARANCEUR CLEAR 01/20/2016 1850   LABSPEC >1.030* 01/20/2016 1850   PHURINE 5.0 01/20/2016 1850   GLUCOSEU NEGATIVE 01/20/2016 1850   HGBUR NEGATIVE 01/20/2016 1850   BILIRUBINUR NEGATIVE 01/20/2016 1850   KETONESUR TRACE* 01/20/2016 1850   PROTEINUR NEGATIVE 01/20/2016 1850   UROBILINOGEN 0.2 03/08/2015 0740   NITRITE NEGATIVE 01/20/2016 1850   LEUKOCYTESUR NEGATIVE 01/20/2016 1850   Sepsis Labs Invalid input(s): PROCALCITONIN,  WBC,  LACTICIDVEN Microbiology  Recent Results (from the past 240 hour(s))  Culture, blood (Routine X 2) w Reflex to ID Panel     Status: None   Collection Time: 01/20/16  7:25 PM  Result Value Ref Range Status   Specimen Description BLOOD RIGHT HAND  Final   Special Requests BOTTLES DRAWN AEROBIC AND ANAEROBIC 6CC  Final   Culture NO GROWTH 5 DAYS  Final   Report  Status 01/25/2016 FINAL  Final  Culture, blood (Routine X 2) w Reflex to ID Panel     Status: None   Collection Time: 01/20/16  7:34 PM  Result Value Ref Range Status   Specimen Description BLOOD LEFT ARM  Final   Special Requests BOTTLES DRAWN AEROBIC AND ANAEROBIC 6CC  Final   Culture NO GROWTH 5 DAYS  Final   Report Status 01/25/2016 FINAL  Final  MRSA PCR Screening     Status: None   Collection Time: 01/20/16 10:55 PM  Result Value Ref Range Status   MRSA by PCR NEGATIVE NEGATIVE Final    Comment:        The GeneXpert MRSA Assay (FDA approved for NASAL specimens only), is one component of a comprehensive MRSA colonization surveillance program. It is not intended to diagnose MRSA infection nor to guide or monitor treatment for MRSA infections.      Time coordinating discharge: Over 30 minutes  SIGNED:  Erick BlinksMemon, Jehanzeb, MD  [facility] 01/27/2016, 12:36 PM If 7PM-7AM, please contact night-coverage [email or web address] Password [email or web address]   By signing my name below, I, Zadie CleverlyJessica Augustus, attest that this documentation has been prepared under the direction and in the presence of Erick BlinksJehanzeb Memon, MD. Electronically signed: Zadie CleverlyJessica Augustus, Scribe. 01/27/2016 11:40am   I, Dr. Erick BlinksJehanzeb Memon, personally performed the services described in this documentaiton. All medical record entries made by the scribe were at my direction and in my presence. I have reviewed the chart and agree that the record reflects my personal performance and is accurate and complete  Erick BlinksJehanzeb Memon, MD, 01/27/2016 12:36 PM

## 2016-01-27 ENCOUNTER — Ambulatory Visit: Payer: Commercial Managed Care - HMO | Admitting: Adult Health

## 2016-01-27 ENCOUNTER — Encounter (HOSPITAL_COMMUNITY)
Admission: EM | Disposition: A | Discharge status: Home / Self Care | Payer: Self-pay | Source: Home / Self Care | Attending: Emergency Medicine

## 2016-01-27 DIAGNOSIS — K625 Hemorrhage of anus and rectum: Secondary | ICD-10-CM | POA: Insufficient documentation

## 2016-01-27 DIAGNOSIS — K922 Gastrointestinal hemorrhage, unspecified: Secondary | ICD-10-CM | POA: Diagnosis not present

## 2016-01-27 DIAGNOSIS — K5730 Diverticulosis of large intestine without perforation or abscess without bleeding: Secondary | ICD-10-CM

## 2016-01-27 DIAGNOSIS — K921 Melena: Secondary | ICD-10-CM | POA: Diagnosis not present

## 2016-01-27 HISTORY — PX: COLONOSCOPY: SHX5424

## 2016-01-27 LAB — BASIC METABOLIC PANEL
Anion gap: 10 (ref 5–15)
BUN: 6 mg/dL (ref 6–20)
CALCIUM: 9 mg/dL (ref 8.9–10.3)
CHLORIDE: 102 mmol/L (ref 101–111)
CO2: 28 mmol/L (ref 22–32)
CREATININE: 0.79 mg/dL (ref 0.44–1.00)
Glucose, Bld: 113 mg/dL — ABNORMAL HIGH (ref 65–99)
Potassium: 3 mmol/L — ABNORMAL LOW (ref 3.5–5.1)
SODIUM: 140 mmol/L (ref 135–145)

## 2016-01-27 LAB — CBC
HCT: 29 % — ABNORMAL LOW (ref 36.0–46.0)
Hemoglobin: 9.6 g/dL — ABNORMAL LOW (ref 12.0–15.0)
MCH: 30.6 pg (ref 26.0–34.0)
MCHC: 33.1 g/dL (ref 30.0–36.0)
MCV: 92.4 fL (ref 78.0–100.0)
PLATELETS: 228 10*3/uL (ref 150–400)
RBC: 3.14 MIL/uL — AB (ref 3.87–5.11)
RDW: 15.9 % — AB (ref 11.5–15.5)
WBC: 5.4 10*3/uL (ref 4.0–10.5)

## 2016-01-27 LAB — GLUCOSE, CAPILLARY
GLUCOSE-CAPILLARY: 123 mg/dL — AB (ref 65–99)
Glucose-Capillary: 131 mg/dL — ABNORMAL HIGH (ref 65–99)

## 2016-01-27 SURGERY — COLONOSCOPY
Anesthesia: Moderate Sedation

## 2016-01-27 MED ORDER — ONDANSETRON HCL 4 MG/2ML IJ SOLN
INTRAMUSCULAR | Status: DC | PRN
  Administered 2016-01-27: 4 mg via INTRAVENOUS

## 2016-01-27 MED ORDER — MIDAZOLAM HCL 5 MG/5ML IJ SOLN
INTRAMUSCULAR | Status: DC | PRN
  Administered 2016-01-27: 1 mg via INTRAVENOUS
  Administered 2016-01-27 (×3): 2 mg via INTRAVENOUS

## 2016-01-27 MED ORDER — POTASSIUM CHLORIDE CRYS ER 20 MEQ PO TBCR
40.0000 meq | EXTENDED_RELEASE_TABLET | ORAL | Status: AC
  Administered 2016-01-27 (×2): 40 meq via ORAL
  Filled 2016-01-27 (×2): qty 2

## 2016-01-27 MED ORDER — SODIUM CHLORIDE 0.9 % IV SOLN
INTRAVENOUS | Status: DC
  Administered 2016-01-27: 1000 mL via INTRAVENOUS

## 2016-01-27 MED ORDER — INSULIN ASPART 100 UNIT/ML ~~LOC~~ SOLN: 0.0000 [IU] | Freq: Three times a day (TID) | SUBCUTANEOUS | Status: DC

## 2016-01-27 MED ORDER — MEPERIDINE HCL 100 MG/ML IJ SOLN
INTRAMUSCULAR | Status: AC
  Filled 2016-01-27: qty 2

## 2016-01-27 MED ORDER — POTASSIUM CHLORIDE CRYS ER 20 MEQ PO TBCR
20.0000 meq | EXTENDED_RELEASE_TABLET | Freq: Two times a day (BID) | ORAL | Status: DC
  Administered 2016-01-27: 20 meq via ORAL
  Filled 2016-01-27: qty 1

## 2016-01-27 MED ORDER — APIXABAN 5 MG PO TABS: 5.0000 mg | ORAL_TABLET | Freq: Two times a day (BID) | ORAL | Status: AC

## 2016-01-27 MED ORDER — MIDAZOLAM HCL 5 MG/5ML IJ SOLN
INTRAMUSCULAR | Status: DC
Start: 2016-01-27 — End: 2016-01-27
  Filled 2016-01-27: qty 10

## 2016-01-27 MED ORDER — MEPERIDINE HCL 100 MG/ML IJ SOLN
INTRAMUSCULAR | Status: DC | PRN
  Administered 2016-01-27 (×2): 50 mg via INTRAVENOUS
  Administered 2016-01-27 (×2): 25 mg via INTRAVENOUS

## 2016-01-27 MED ORDER — ONDANSETRON HCL 4 MG/2ML IJ SOLN
INTRAMUSCULAR | Status: AC
  Filled 2016-01-27: qty 2

## 2016-01-27 MED ORDER — SODIUM CHLORIDE 0.9% FLUSH
INTRAVENOUS | Status: AC
  Filled 2016-01-27: qty 10

## 2016-01-27 NOTE — Op Note (Signed)
[facility] Patient Name: [place] Procedure Date: 01/27/2016 9:05 AM MRN: [id number] Date of Birth: 1950/12/17 Attending MD: Gennette Pac , MD CSN: [id number] Age: 65 Admit Type: Inpatient Procedure:                Ileo-colonoscopy with bleeding control thearapy;                            hemoglobin stable - 9.4 this am Indications:              Hematochezia Providers:                Gennette Pac, MD, Jannett Celestine, RN, Marlene Lard, Technician Referring MD:              Medicines:                Midazolam 7 mg IV, Meperidine 150 mg IV,                            Ondansetron 4 mg IV Complications:            No immediate complications. Estimated Blood Loss:     Estimated blood loss was minimal. Procedure:                Pre-Anesthesia Assessment:                           - Prior to the procedure, a History and Physical                            was performed, and patient medications and                            allergies were reviewed. [name] tolerance of                            previous anesthesia was also reviewed. The risks                            and benefits of the procedure and the sedation                            options and risks were discussed with the patient.                            All questions were answered, and informed consent                            was obtained. Prior Anticoagulants: The patient has                            taken no previous anticoagulant or antiplatelet  agents and last took Eliquis (apixaban) 2 days                            prior to the procedure. ASA Grade Assessment: III -                            A patient with severe systemic disease. After                            reviewing the risks and benefits, the patient was                            deemed in satisfactory condition to undergo the                             procedure.                           After obtaining informed consent, the colonoscope                            was passed under direct vision. Throughout the                            procedure, the patient's blood pressure, pulse, and                            oxygen saturations were monitored continuously. The                            EC-[id number] ([id number]) scope was introduced through                            the anus and advanced to the 5 cm into the ileum.                            The colonoscopy was performed without difficulty.                            The patient tolerated the procedure well. The                            quality of the bowel preparation was adequate. The                            terminal ileum, ileocecal valve, appendiceal                            orifice, and rectum were photographed. Scope In: 9:21:34 AM Scope Out: 9:48:23 AM Scope Withdrawal Time: 0 hours 19 minutes 0 seconds  Total Procedure Duration: 0 hours 26 minutes 49 seconds  Findings:      The perianal and digital rectal examinations were normal.      Scattered medium-mouthed diverticula were found in the  sigmoid colon and       descending colon.      Fresh blood localized to the ascending segment. Blood trail was followed       to a discrete area mucosa.This was carefully observed. Actively bleeding       Dieulafoy lesion identified. Please see photos. For hemostasis, three       hemostatic clips were successfully placed. There was no bleeding at the       end of the procedure. Estimated blood loss was minimal.      The exam was otherwise without abnormality on direct and retroflexion       views. The distal 5 cm of terminal ileal mucosa appeared normal. Impression:               - Diverticulosis in the sigmoid colon and in the                            descending colon. Bleeding control therapy employed                            as described above- Clips were placed.                            - The examination was otherwise normal on direct                            and retroflexion views.                           - No specimens collected. Moderate Sedation:      Moderate (conscious) sedation was administered by the endoscopy nurse       and supervised by the endoscopist. The following parameters were       monitored: oxygen saturation, heart rate, blood pressure, respiratory       rate, EKG, adequacy of pulmonary ventilation, and response to care.       Total physician intraservice time was 40 minutes. Recommendation:           - Patient has a contact number available for                            emergencies. The signs and symptoms of potential                            delayed complications were discussed with the                            patient. Return to normal activities tomorrow.                            Written discharge instructions were provided to the                            patient.                           - Return patient to hospital ward for ongoing care.                           -  Diabetic (ADA) diet.                           - Continue present medications including a twice a                            day PPI therapy for recently diagnosed peptic ulcer                            disease. Avoid all NSAIDs.                           - Repeat colonoscopy in 10 years for screening.                           - Return to GI clinic in 3 months to set up a                            follow-up EGD.                           - Resume Eliquis (apixaban) at prior dose tomorrow.                            No future MRI until clips known to be gone. Procedure Code(s):        --- Professional ---                           [phone], Colonoscopy, flexible; with control of                            bleeding, any method                           99152, Moderate sedation services provided by the                            same physician or other qualified  health care                            professional performing the diagnostic or                            therapeutic service that the sedation supports,                            requiring the presence of an independent trained                            observer to assist in the monitoring of the                            patient's level of consciousness and physiological  status; initial 15 minutes of intraservice time,                            patient age 90 years or older                           [phone], Moderate sedation services; each additional                            15 minutes intraservice time                           99153, Moderate sedation services; each additional                            15 minutes intraservice time Diagnosis Code(s):        --- Professional ---                           K92.1, Melena (includes Hematochezia)                           K57.30, Diverticulosis of large intestine without                            perforation or abscess without bleeding CPT copyright 2016 American Medical Association. All rights reserved. The codes documented in this report are preliminary and upon coder review may  be revised to meet current compliance requirements. Gerrit Friendsobert M. Rourk, MD Gennette Pacobert Michael Rourk, MD 01/27/2016 10:11:13 AM This report has been signed electronically. Number of Addenda: 0

## 2016-01-27 NOTE — Progress Notes (Signed)
Patient with orders to be discharge home. Discharge instructions reviewed, patient verbalized understanding. Patient stable. Patient left in private vehicle with spouse.

## 2016-01-27 NOTE — Evaluation (Signed)
Physical Therapy Evaluation Patient Details Name: Megan [place]ek[place]ia A [name] MRN: [id number] DOB: 10/17/1950 Today's Date: 01/27/2016   History of Present Illness  65 yo F admitted 01/25/2016 due to BRBPR likely from external hemorrhoid and after starting Eliquis.  Recent UGIB secondary to NSAID use.  Hgb: 9.6 and Hct: 29.0 today.  Colonoscopy revealed diverticulitis - clips placed.  PMH: HTN, DM, depression, obesity, colon polyps, agoraphobia with panic disorder, chronic pain, CVA Feb 2017 with L sided weakness & d/c from OPPT 01/08/2016, cholecystectomy, hypothyroidism, Afib.  Clinical Impression  Pt received in bed, and was agreeable to PT evaluation.  Pt states that she is independent with ambulation, as well as independent with ADL's.  Pt recently d/c from OPPT.  Today, pt ambulated 23100ft with no AD and supervision/independently.  She also negotiated 6 steps - Mod (I).  Pt does not demonstrate need for skilled PT, however she was encouraged to continue with her HEP from OPPT.  Will d/c skilled PT at this time.     Follow Up Recommendations No PT follow up    Equipment Recommendations  None recommended by PT    Recommendations for Other Services       Precautions / Restrictions Restrictions Weight Bearing Restrictions: No      Mobility  Bed Mobility Overal bed mobility: Independent                Transfers Overall transfer level: Modified independent                  Ambulation/Gait Ambulation/Gait assistance: Supervision Ambulation Distance (Feet): 200 Feet Assistive device: None Gait Pattern/deviations: Step-through pattern     General Gait Details: Pt demonstrates decrased gait speed.  No overt LOB.   Stairs            Wheelchair Mobility    Modified Rankin (Stroke Patients Only)       Balance Overall balance assessment: No apparent balance deficits (not formally assessed)                                           Pertinent  Vitals/Pain Pain Assessment: No/denies pain    Home Living   Living Arrangements: Spouse/significant other Available Help at Discharge: Available PRN/intermittently Type of Home: Mobile home Home Access: Stairs to enter Entrance Stairs-Rails: Can reach both Entrance Stairs-Number of Steps: 6 Home Layout: One level Home Equipment: Cane - single point;Bedside commode      Prior Function     Gait / Transfers Assistance Needed: Pt states she was independent with ambulation without the cane.  Pt states she could walk ~100 yards to the mailbox and back.    ADL's / Homemaking Assistance Needed: Pt states her and husband share responsibility of cooking & cleaning.  Husband does grocery shopping for years. Pt states she is independent with dressing and bathing.         Hand Dominance   Dominant Hand: Right    Extremity/Trunk Assessment   Upper Extremity Assessment: Overall WFL for tasks assessed           Lower Extremity Assessment: Overall WFL for tasks assessed         Communication   Communication: No difficulties  Cognition Arousal/Alertness: Awake/alert Behavior During Therapy: WFL for tasks assessed/performed Overall Cognitive Status: Within Functional Limits for tasks assessed  General Comments      Exercises        Assessment/Plan    PT Assessment Patent does not need any further PT services  PT Diagnosis Generalized weakness   PT Problem List    PT Treatment Interventions     PT Goals (Current goals can be found in the Care Plan section) Acute Rehab PT Goals PT Goal Formulation: All assessment and education complete, DC therapy    Frequency     Barriers to discharge        Co-evaluation               End of Session Equipment Utilized During Treatment: Gait belt Activity Tolerance: Patient tolerated treatment well Patient left: in chair;with call bell/phone within reach      Functional Assessment Tool  Used: [employer] AM-PAC "6-clicks"  Functional Limitation: Mobility: Walking and moving around Mobility: Walking and Moving Around Current Status [phone]): At least 1 percent but less than 20 percent impaired, limited or restricted Mobility: Walking and Moving Around Goal Status [phone]): At least 1 percent but less than 20 percent impaired, limited or restricted Mobility: Walking and Moving Around Discharge Status [phone]): At least 1 percent but less than 20 percent impaired, limited or restricted    Time: 9147-8295 PT Time Calculation (min) (ACUTE ONLY): 19 min   Charges:   PT Evaluation $PT Eval Low Complexity: 1 Procedure     PT G Codes:   PT G-Codes **NOT FOR INPATIENT CLASS** Functional Assessment Tool Used: [employer] "6-clicks"  Functional Limitation: Mobility: Walking and moving around Mobility: Walking and Moving Around Current Status [phone]): At least 1 percent but less than 20 percent impaired, limited or restricted Mobility: Walking and Moving Around Goal Status [phone]): At least 1 percent but less than 20 percent impaired, limited or restricted Mobility: Walking and Moving Around Discharge Status [phone]): At least 1 percent but less than 20 percent impaired, limited or restricted    Beth Traeton Bordas, PT, DPT X: [phone]

## 2016-01-28 ENCOUNTER — Encounter (HOSPITAL_COMMUNITY): Payer: Self-pay | Admitting: Internal Medicine

## 2016-03-02 ENCOUNTER — Ambulatory Visit: Payer: Medicare HMO | Admitting: Gastroenterology

## 2016-03-13 ENCOUNTER — Encounter (HOSPITAL_COMMUNITY): Payer: Self-pay | Admitting: Emergency Medicine

## 2016-03-13 ENCOUNTER — Emergency Department (HOSPITAL_COMMUNITY): Payer: Medicare HMO

## 2016-03-13 ENCOUNTER — Emergency Department (HOSPITAL_COMMUNITY)
Admission: EM | Admit: 2016-03-13 | Discharge: 2016-03-13 | Discharge status: Against Medical Advice | Payer: Medicare HMO | Attending: Emergency Medicine | Admitting: Emergency Medicine

## 2016-03-13 DIAGNOSIS — Y939 Activity, unspecified: Secondary | ICD-10-CM | POA: Insufficient documentation

## 2016-03-13 DIAGNOSIS — Y929 Unspecified place or not applicable: Secondary | ICD-10-CM | POA: Diagnosis not present

## 2016-03-13 DIAGNOSIS — S0990XA Unspecified injury of head, initial encounter: Secondary | ICD-10-CM | POA: Insufficient documentation

## 2016-03-13 DIAGNOSIS — Z8673 Personal history of transient ischemic attack (TIA), and cerebral infarction without residual deficits: Secondary | ICD-10-CM | POA: Diagnosis not present

## 2016-03-13 DIAGNOSIS — W19XXXA Unspecified fall, initial encounter: Secondary | ICD-10-CM

## 2016-03-13 DIAGNOSIS — Z79899 Other long term (current) drug therapy: Secondary | ICD-10-CM | POA: Insufficient documentation

## 2016-03-13 DIAGNOSIS — I10 Essential (primary) hypertension: Secondary | ICD-10-CM | POA: Diagnosis not present

## 2016-03-13 DIAGNOSIS — Z87891 Personal history of nicotine dependence: Secondary | ICD-10-CM | POA: Diagnosis not present

## 2016-03-13 DIAGNOSIS — W1839XA Other fall on same level, initial encounter: Secondary | ICD-10-CM | POA: Diagnosis not present

## 2016-03-13 DIAGNOSIS — M542 Cervicalgia: Secondary | ICD-10-CM | POA: Insufficient documentation

## 2016-03-13 DIAGNOSIS — Y999 Unspecified external cause status: Secondary | ICD-10-CM | POA: Diagnosis not present

## 2016-03-13 DIAGNOSIS — E119 Type 2 diabetes mellitus without complications: Secondary | ICD-10-CM | POA: Diagnosis not present

## 2016-03-13 DIAGNOSIS — Z79891 Long term (current) use of opiate analgesic: Secondary | ICD-10-CM | POA: Insufficient documentation

## 2016-03-13 LAB — CBG MONITORING, ED: GLUCOSE-CAPILLARY: 107 mg/dL — AB (ref 65–99)

## 2016-03-13 NOTE — ED Notes (Signed)
Pt c/o pain in head since fall 2 months ago

## 2016-03-13 NOTE — ED Notes (Signed)
Pt c/o headache.  States that if we don't do anything soon she was going to leave.

## 2016-03-13 NOTE — ED Notes (Signed)
Pt left ambulatory without difficulty.  Did not get AMA signature.

## 2016-03-13 NOTE — ED Notes (Signed)
Pt keeps walking out to desk and stating that she is going to leave.

## 2016-03-13 NOTE — ED Provider Notes (Signed)
[facility]-EMER[facility]Y DEPT Provider Note   CSN: [id number] Arrival date & time: 03/13/16  4098  By signing my name below, I, Megan [name], attest that this documentation has been prepared under the direction and in the presence of [name], MD. Electronically Signed: Soijett [name], ED Scribe. 03/13/16. 10:00 AM.   History   Chief Complaint Chief Complaint  Patient presents with  . Fall    HPI [place] [name] is a 65 y.o. female with a medical hx of HTN, DM, stroke, who presents to the Emergency Department complaining of a fall onset 2 months ago. Pt notes that she fell an struck her posterior head and didn't have any pain initially following the incident, but notes that she began to have left sided neck pain x 1 month ago. Pt states that she came into the ED today due to her pain worsening last night. Pt is having associated symptoms of left sided neck pain x 1 month. She notes that she has not tried any medications for the relief of her symptoms. She denies any other symptoms.    The history is provided by the patient. No language interpreter was used.  Fall  This is a new problem. Episode onset: 2 months ago. The problem occurs rarely. The problem has not changed since onset.Pertinent negatives include no chest pain, no abdominal pain and no headaches. Nothing aggravates the symptoms. Nothing relieves the symptoms. She has tried nothing for the symptoms. The treatment provided no relief.    Past Medical History:  Diagnosis Date  . Agoraphobia with panic disorder   . Anxiety   . Chronic pain 03/08/15   Hydrocodone-APAP 10/325 mg, # 120 q month  . Colon polyps   . Complication of anesthesia   . Depression   . DM (diabetes mellitus) (HCC)   . HTN (hypertension)   . Obesity   . PONV (postoperative nausea and vomiting)   . Stroke [facility])     Patient Active Problem List   Diagnosis Date Noted  . Diverticulosis of colon without hemorrhage   . Rectal bleeding   . BRBPR (bright  red blood per rectum) 01/25/2016  . Hypoglycemia 01/20/2016  . Upper GI bleeding 01/20/2016  . H/O: CVA (cerebrovascular accident) 01/20/2016  . Chronic pain 01/20/2016  . DM type 2 (diabetes mellitus, type 2) (HCC) 01/20/2016  . Benign essential HTN 01/20/2016  . Anxiety and depression 01/20/2016  . Blood loss anemia 01/20/2016  . Knee pain, acute   . Cephalalgia   . GERD (gastroesophageal reflux disease) 06/14/2011  . FH: colon cancer 04/05/2011  . History of colon polyps 04/05/2011  . Left sided abdominal pain 04/05/2011  . N&V (nausea and vomiting) 04/05/2011    Past Surgical History:  Procedure Laterality Date  . ABDOMINAL HYSTERECTOMY  15 yrs ago   with bso at aph  . CARDIAC CATHETERIZATION  2004   normal  . CHOLECYSTECTOMY  25 yrs ago   [facility]  . COLONOSCOPY  05/04/11   left-sided colonic diverticulosis  . COLONOSCOPY N/A 01/27/2016   Procedure: COLONOSCOPY;  Surgeon: Corbin Ade, MD;  Location: [facility] ENDO SUITE;  Service: Endoscopy;  Laterality: N/A;  . ESOPHAGOGASTRODUODENOSCOPY  05/04/2011   Abnormal distal esophagus, biopsies consistent with inflammation due to acid reflux. No evidence of Barrett's. No H. pylori  . ESOPHAGOGASTRODUODENOSCOPY N/A 01/22/2016   Procedure: ESOPHAGOGASTRODUODENOSCOPY (EGD);  Surgeon: Malissa Hippo, MD;  Location: [facility] ENDO SUITE;  Service: Endoscopy;  Laterality: N/A;  . RADIOLOGY WITH ANESTHESIA N/A 08/20/2015  Procedure: RADIOLOGY WITH ANESTHESIA;  Surgeon: Julieanne Cotton, MD;  Location: [facility] OR;  Service: Radiology;  Laterality: N/A;    OB History    No data available       Home Medications    Prior to Admission medications   Medication Sig Start Date End Date Taking? Authorizing Provider  apixaban (ELIQUIS) 5 MG TABS tablet Take 1 tablet (5 mg total) by mouth 2 (two) times daily. Restart on 7/14 01/27/16  Yes Erick Blinks, MD  atorvastatin (LIPITOR) 20 MG tablet Take 20 mg by mouth at bedtime.    Yes Historical Provider, MD    Cholecalciferol (VITAMIN D-3 PO) Take 1 tablet by mouth daily.   Yes Historical Provider, MD  diazepam (VALIUM) 10 MG tablet Take 1 tablet by mouth at bedtime as needed for sleep.  12/10/15  Yes Historical Provider, MD  enalapril (VASOTEC) 20 MG tablet Take 20 mg by mouth 2 (two) times daily.    Yes Historical Provider, MD  glipiZIDE (GLUCOTROL) 10 MG tablet Take 10 mg by mouth every morning. In the AM   Yes Historical Provider, MD  hydrochlorothiazide (HYDRODIURIL) 25 MG tablet Take 25 mg by mouth daily.     Yes Historical Provider, MD  HYDROcodone-acetaminophen (NORCO) 10-325 MG tablet Take 1 tablet by mouth every 6 (six) hours as needed for moderate pain. 12/24/15  Yes Lonia Skinner Sofia, PA-C  levothyroxine (SYNTHROID, LEVOTHROID) 25 MCG tablet Take 25 mcg by mouth daily. 07/14/15  Yes Historical Provider, MD  metFORMIN (GLUCOPHAGE) 500 MG tablet Take 1,000 mg by mouth 2 (two) times daily with a meal.    Yes Historical Provider, MD  metoprolol (TOPROL-XL) 50 MG 24 hr tablet Take 50 mg by mouth daily with lunch.    Yes Historical Provider, MD  pantoprazole (PROTONIX) 40 MG tablet Take 1 tablet (40 mg total) by mouth 2 (two) times daily before a meal. 01/24/16  Yes Houston Siren, MD  pioglitazone (ACTOS) 15 MG tablet Take 15 mg by mouth daily.   Yes Historical Provider, MD    Family History Family History  Problem Relation Age of Onset  . Colon cancer Paternal Aunt     2, colon cancer  . Colon cancer Paternal Uncle     6, colon cancer  . Colon cancer Father     deceased, age 107  . Colon cancer      numerous cousins died before age 43 with colon cancer  . Colon cancer Sister     deceased, age 70  . Anesthesia problems Neg Hx   . Hypotension Neg Hx   . Malignant hyperthermia Neg Hx   . Pseudochol deficiency Neg Hx     Social History Social History  Substance Use Topics  . Smoking status: Former Smoker    Packs/day: 0.25    Years: 15.00    Types: Cigarettes    Start date: 07/17/1969    Quit  date: 07/18/1971  . Smokeless tobacco: Current User    Types: Snuff     Comment: never more than 1-2 cig/day in entire life  . Alcohol use No     Allergies   Naproxen   Review of Systems Review of Systems  Constitutional: Negative for appetite change and fatigue.  HENT: Negative for congestion, ear discharge and sinus pressure.   Eyes: Negative for discharge.  Respiratory: Negative for cough.   Cardiovascular: Negative for chest pain.  Gastrointestinal: Negative for abdominal pain and diarrhea.  Genitourinary: Negative for frequency and hematuria.  Musculoskeletal: Positive for neck pain (left sided). Negative for back pain.  Skin: Negative for rash.  Neurological: Negative for seizures and headaches.  Psychiatric/Behavioral: Negative for hallucinations.     Physical Exam Updated Vital Signs BP 127/61 (BP Location: Left Arm)   Pulse (!) 55   Temp 97.7 F (36.5 C) (Oral)   Resp 20   Ht 5\' 2"  (1.575 m)   Wt 200 lb (90.7 kg)   SpO2 98%   BMI 36.58 kg/m   Physical Exam  Constitutional: She is oriented to person, place, and time. She appears well-developed.  HENT:  Head: Normocephalic.  Eyes: Conjunctivae and EOM are normal. No scleral icterus.  Neck: Neck supple. Muscular tenderness present. No thyromegaly present.  Mild tenderness to superior left sided neck  Cardiovascular: Normal rate and regular rhythm.  Exam reveals no gallop and no friction rub.   No murmur heard. Pulmonary/Chest: No stridor. She has no wheezes. She has no rales. She exhibits no tenderness.  Abdominal: She exhibits no distension. There is no tenderness. There is no rebound.  Musculoskeletal: Normal range of motion. She exhibits no edema.  Lymphadenopathy:    She has no cervical adenopathy.  Neurological: She is oriented to person, place, and time. She exhibits normal muscle tone. Coordination normal.  Skin: No rash noted. No erythema.  Psychiatric: She has a normal mood and affect. Her  behavior is normal.     ED Treatments / Results  DIAGNOSTIC STUDIES: Oxygen Saturation is 98% on RA, nl by my interpretation.    COORDINATION OF CARE: 9:58 AM Discussed treatment plan with pt at bedside which includes CBG and pt agreed to plan.   Labs (all labs ordered are listed, but only abnormal results are displayed) Labs Reviewed  CBG MONITORING, ED - Abnormal; Notable for the following:       Result Value   Glucose-Capillary 107 (*)    All other components within normal limits    EKG  EKG Interpretation None       Radiology Ct Cervical Spine Wo Contrast  Result Date: 03/13/2016 CLINICAL DATA:  Neck pain after fall 2 months ago. EXAM: CT CERVICAL SPINE WITHOUT CONTRAST TECHNIQUE: Multidetector CT imaging of the cervical spine was performed without intravenous contrast. Multiplanar CT image reconstructions were also generated. COMPARISON:  None. FINDINGS: Mild reversal of normal lordosis is noted most likely positional in origin. No fracture or spondylolisthesis is noted. Mild degenerative disc disease is noted at C6-7 with anterior and posterior osteophyte formation. Remaining disc spaces and posterior facet joints appear intact. Visualized lung fields appear normal. IMPRESSION: Mild degenerative disc disease is noted at C6-7. No acute abnormality seen in the cervical spine. Electronically Signed   By: Lupita RaiderJames  Green Jr, M.D.   On: 03/13/2016 11:05    Procedures Procedures (including critical care time)  Medications Ordered in ED Medications - No data to display   Initial Impression / Assessment and Plan / ED Course  I have reviewed the triage vital signs and the nursing notes.  Pertinent labs & imaging results that were available during my care of the patient were reviewed by me and considered in my medical decision making (see chart for details).  Clinical Course    Fall head injury, left ama  Final Clinical Impressions(s) / ED Diagnoses   Final diagnoses:    None    New Prescriptions New Prescriptions   No medications on file    The chart was scribed for me under my direct supervision.  I personally performed the history, physical, and medical decision making and all procedures in the evaluation of this patient.[name], MD 03/18/16 0830

## 2016-03-13 NOTE — ED Triage Notes (Signed)
Pt reports fall hitting posterior head two months ago, no LOC. Pt takes eliquis bid.  Pt alert and oriented at this time, occasional nausea.  Pt reports she came in today because the pain was worse last night.

## 2016-03-15 ENCOUNTER — Encounter: Payer: Self-pay | Admitting: Gastroenterology

## 2016-03-15 ENCOUNTER — Ambulatory Visit: Payer: Medicare HMO | Admitting: Gastroenterology

## 2016-03-15 ENCOUNTER — Telehealth: Payer: Self-pay | Admitting: Gastroenterology

## 2016-03-15 NOTE — Telephone Encounter (Signed)
PT WAS A NO SHOW AND LETTER SENT  °

## 2016-03-16 ENCOUNTER — Emergency Department (HOSPITAL_COMMUNITY)
Admission: EM | Admit: 2016-03-16 | Discharge: 2016-03-16 | Disposition: A | Discharge status: Home / Self Care | Payer: Medicare HMO | Attending: Emergency Medicine | Admitting: Emergency Medicine

## 2016-03-16 ENCOUNTER — Emergency Department (HOSPITAL_COMMUNITY): Payer: Medicare HMO

## 2016-03-16 ENCOUNTER — Encounter (HOSPITAL_COMMUNITY): Payer: Self-pay | Admitting: Cardiology

## 2016-03-16 DIAGNOSIS — Y939 Activity, unspecified: Secondary | ICD-10-CM | POA: Insufficient documentation

## 2016-03-16 DIAGNOSIS — I10 Essential (primary) hypertension: Secondary | ICD-10-CM | POA: Diagnosis not present

## 2016-03-16 DIAGNOSIS — S8002XA Contusion of left knee, initial encounter: Secondary | ICD-10-CM | POA: Diagnosis not present

## 2016-03-16 DIAGNOSIS — Y999 Unspecified external cause status: Secondary | ICD-10-CM | POA: Insufficient documentation

## 2016-03-16 DIAGNOSIS — Z79899 Other long term (current) drug therapy: Secondary | ICD-10-CM | POA: Insufficient documentation

## 2016-03-16 DIAGNOSIS — Z87891 Personal history of nicotine dependence: Secondary | ICD-10-CM | POA: Insufficient documentation

## 2016-03-16 DIAGNOSIS — S8992XA Unspecified injury of left lower leg, initial encounter: Secondary | ICD-10-CM | POA: Diagnosis present

## 2016-03-16 DIAGNOSIS — M542 Cervicalgia: Secondary | ICD-10-CM | POA: Insufficient documentation

## 2016-03-16 DIAGNOSIS — Z7984 Long term (current) use of oral hypoglycemic drugs: Secondary | ICD-10-CM | POA: Diagnosis not present

## 2016-03-16 DIAGNOSIS — Z8673 Personal history of transient ischemic attack (TIA), and cerebral infarction without residual deficits: Secondary | ICD-10-CM | POA: Diagnosis not present

## 2016-03-16 DIAGNOSIS — Y92009 Unspecified place in unspecified non-institutional (private) residence as the place of occurrence of the external cause: Secondary | ICD-10-CM | POA: Insufficient documentation

## 2016-03-16 DIAGNOSIS — W08XXXA Fall from other furniture, initial encounter: Secondary | ICD-10-CM | POA: Diagnosis not present

## 2016-03-16 DIAGNOSIS — E119 Type 2 diabetes mellitus without complications: Secondary | ICD-10-CM | POA: Diagnosis not present

## 2016-03-16 MED ORDER — TRAMADOL HCL 50 MG PO TABS
50.0000 mg | ORAL_TABLET | Freq: Once | ORAL | Status: AC
  Administered 2016-03-16: 50 mg via ORAL
  Filled 2016-03-16: qty 1

## 2016-03-16 NOTE — ED Triage Notes (Signed)
C/o neck and head pain for months.  Seen here in the last week for the same and Left AMA.  Continues to have pain and states she also fell this morning and hurt left knee.

## 2016-03-16 NOTE — ED Notes (Signed)
In to discharge pt. Pt upset because she was not getting a Rx for pain medication.

## 2016-03-16 NOTE — ED Notes (Signed)
Pt ambulatory with nurse at side to restroom. Denied knee pain while ambulating or dizziness.

## 2016-03-16 NOTE — Discharge Instructions (Signed)
It was our pleasure to provide your ER care today - we hope that you feel better.  Take tylenol as need for pain.  Your xray was read as showing a small avulsion fracture from the medial femoral condyle - follow up with your doctor/orthopedist in the next 1-2 weeks.   As you are on a blood thinner, it is very important that you minimize the risk of falling.  Use walker as need. Follow up with your doctor this coming week for recheck, and to discuss whether to continue this medication, given your fall risk.  Return to ER if worse, new symptoms, severe or intractable pain, numbness/weakness, severe headache, other concern.  You were given pain medication in the ER - no driving for the next 4 hours.

## 2016-03-16 NOTE — ED Provider Notes (Signed)
[facility]-EMERGENCY DEPT Provider Note   CSN: [id number] Arrival date & time: 03/16/16  0941  By signing my name below, I, Placido Sou, attest that this documentation has been prepared under the direction and in the presence of Cathren Laine, MD. Electronically Signed: Placido Sou, ED Scribe. 03/16/16. 10:10 AM.    History   Chief Complaint Chief Complaint  Patient presents with  . Fall    HPI HPI Comments: AURIANNA EARLYWINE is a 65 y.o. female who presents to the Emergency Department complaining of a fall that occurred last night.  Pt states she was in the ED 3 days ago and had imaging performed of her neck and ultimately left AMA because "she didn't feel like waiting". Pt states last night she stood up from the couch, slipped and fell to the couch landing on her left anterior knee. She reports associated, mild, left anterior knee pain and swelling as well as persistence of  her left neck pain which was the reason for her visit 3 days ago. No acute or new neck pain.  Denies hitting or bumping head. No headache.  She took 3 tylenol this morning which provided mild relief. Pt confirms taking Eliquis. She denies CP and SOB. Denies any abn bruising or bleeding.   The history is provided by the patient. No language interpreter was used.    Past Medical History:  Diagnosis Date  . Agoraphobia with panic disorder   . Anxiety   . Chronic pain 03/08/15   Hydrocodone-APAP 10/325 mg, # 120 q month  . Colon polyps   . Complication of anesthesia   . Depression   . DM (diabetes mellitus) (HCC)   . HTN (hypertension)   . Obesity   . PONV (postoperative nausea and vomiting)   . Stroke [facility])     Patient Active Problem List   Diagnosis Date Noted  . Diverticulosis of colon without hemorrhage   . Rectal bleeding   . BRBPR (bright red blood per rectum) 01/25/2016  . Hypoglycemia 01/20/2016  . Upper GI bleeding 01/20/2016  . H/O: CVA (cerebrovascular accident) 01/20/2016  . Chronic pain  01/20/2016  . DM type 2 (diabetes mellitus, type 2) (HCC) 01/20/2016  . Benign essential HTN 01/20/2016  . Anxiety and depression 01/20/2016  . Blood loss anemia 01/20/2016  . Knee pain, acute   . Cephalalgia   . GERD (gastroesophageal reflux disease) 06/14/2011  . FH: colon cancer 04/05/2011  . History of colon polyps 04/05/2011  . Left sided abdominal pain 04/05/2011  . N&V (nausea and vomiting) 04/05/2011    Past Surgical History:  Procedure Laterality Date  . ABDOMINAL HYSTERECTOMY  15 yrs ago   with bso at aph  . CARDIAC CATHETERIZATION  2004   normal  . CHOLECYSTECTOMY  25 yrs ago   [facility]  . COLONOSCOPY  05/04/11   left-sided colonic diverticulosis  . COLONOSCOPY N/A 01/27/2016   Procedure: COLONOSCOPY;  Surgeon: Corbin Ade, MD;  Location: [facility] ENDO SUITE;  Service: Endoscopy;  Laterality: N/A;  . ESOPHAGOGASTRODUODENOSCOPY  05/04/2011   Abnormal distal esophagus, biopsies consistent with inflammation due to acid reflux. No evidence of Barrett's. No H. pylori  . ESOPHAGOGASTRODUODENOSCOPY N/A 01/22/2016   Procedure: ESOPHAGOGASTRODUODENOSCOPY (EGD);  Surgeon: Malissa Hippo, MD;  Location: [facility] ENDO SUITE;  Service: Endoscopy;  Laterality: N/A;  . RADIOLOGY WITH ANESTHESIA N/A 08/20/2015   Procedure: RADIOLOGY WITH ANESTHESIA;  Surgeon: Julieanne Cotton, MD;  Location: [facility] OR;  Service: Radiology;  Laterality: N/A;  OB History    No data available       Home Medications    Prior to Admission medications   Medication Sig Start Date End Date Taking? Authorizing Provider  apixaban (ELIQUIS) 5 MG TABS tablet Take 1 tablet (5 mg total) by mouth 2 (two) times daily. Restart on 7/14 01/27/16   Erick Blinks, MD  atorvastatin (LIPITOR) 20 MG tablet Take 20 mg by mouth at bedtime.     Historical Provider, MD  Cholecalciferol (VITAMIN D-3 PO) Take 1 tablet by mouth daily.    Historical Provider, MD  diazepam (VALIUM) 10 MG tablet Take 1 tablet by mouth at bedtime as needed for  sleep.  12/10/15   Historical Provider, MD  enalapril (VASOTEC) 20 MG tablet Take 20 mg by mouth 2 (two) times daily.     Historical Provider, MD  glipiZIDE (GLUCOTROL) 10 MG tablet Take 10 mg by mouth every morning. In the AM    Historical Provider, MD  hydrochlorothiazide (HYDRODIURIL) 25 MG tablet Take 25 mg by mouth daily.      Historical Provider, MD  HYDROcodone-acetaminophen (NORCO) 10-325 MG tablet Take 1 tablet by mouth every 6 (six) hours as needed for moderate pain. 12/24/15   Elson Areas, PA-C  levothyroxine (SYNTHROID, LEVOTHROID) 25 MCG tablet Take 25 mcg by mouth daily. 07/14/15   Historical Provider, MD  metFORMIN (GLUCOPHAGE) 500 MG tablet Take 1,000 mg by mouth 2 (two) times daily with a meal.     Historical Provider, MD  metoprolol (TOPROL-XL) 50 MG 24 hr tablet Take 50 mg by mouth daily with lunch.     Historical Provider, MD  pantoprazole (PROTONIX) 40 MG tablet Take 1 tablet (40 mg total) by mouth 2 (two) times daily before a meal. 01/24/16   Houston Siren, MD  pioglitazone (ACTOS) 15 MG tablet Take 15 mg by mouth daily.    Historical Provider, MD    Family History Family History  Problem Relation Age of Onset  . Colon cancer Paternal Aunt     2, colon cancer  . Colon cancer Paternal Uncle     6, colon cancer  . Colon cancer Father     deceased, age 80  . Colon cancer      numerous cousins died before age 3 with colon cancer  . Colon cancer Sister     deceased, age 83  . Anesthesia problems Neg Hx   . Hypotension Neg Hx   . Malignant hyperthermia Neg Hx   . Pseudochol deficiency Neg Hx     Social History Social History  Substance Use Topics  . Smoking status: Former Smoker    Packs/day: 0.25    Years: 15.00    Types: Cigarettes    Start date: 07/17/1969    Quit date: 07/18/1971  . Smokeless tobacco: Current User    Types: Snuff     Comment: never more than 1-2 cig/day in entire life  . Alcohol use No     Allergies   Naproxen   Review of Systems Review  of Systems  Constitutional: Negative for fever.  Eyes: Negative for pain and visual disturbance.  Respiratory: Negative for shortness of breath.   Cardiovascular: Negative for chest pain.  Gastrointestinal: Negative for nausea and vomiting.  Genitourinary: Negative for dysuria.  Musculoskeletal: Positive for arthralgias, joint swelling and neck pain. Negative for back pain.  Skin: Negative for color change and wound.  Neurological: Negative for syncope, weakness, light-headedness, numbness and headaches.  All other systems  reviewed and are negative.  Physical Exam Updated Vital Signs BP 123/74 (BP Location: Left Arm)   Pulse 77   Temp 98.4 F (36.9 C) (Oral)   Resp 18   Ht 5\' 2"  (1.575 m)   Wt 200 lb (90.7 kg)   SpO2 99%   BMI 36.58 kg/m   Physical Exam  Constitutional: She is oriented to person, place, and time. She appears well-developed and well-nourished. No distress.  HENT:  Head: Normocephalic and atraumatic.  No head, face, or scalp pain, sts, bruising, or contusion.   Eyes: EOM are normal. Pupils are equal, round, and reactive to light.  Neck: Normal range of motion. Neck supple.  Cardiovascular: Normal rate, regular rhythm, normal heart sounds and intact distal pulses.   Pulmonary/Chest: Effort normal and breath sounds normal.  Abdominal: Soft. She exhibits no distension. There is no tenderness.  Genitourinary:  Genitourinary Comments: No cva tenderness  Musculoskeletal: Normal range of motion. She exhibits tenderness. She exhibits no edema.  Left knee grossly stable. Mild tenderness noted to the left anterior knee. No effusion. Distal pulses palp. Good rom bil ext, no other focal bony tenderness noted. CTLS spine, non tender, aligned, no step off. Mild left trapezius muscular tenderness.   Neurological: She is alert and oriented to person, place, and time.  Speech clear/fluent. Ambulatory. Motor intact bil, stre 5/5. sens grossly intact.   Skin: Skin is warm and  dry. She is not diaphoretic.  Psychiatric: She has a normal mood and affect. Judgment normal.  Nursing note and vitals reviewed.  ED Treatments / Results  Labs (all labs ordered are listed, but only abnormal results are displayed) Labs Reviewed - No data to display  EKG  EKG Interpretation None       Radiology Dg Knee Complete 4 Views Left  Result Date: 03/16/2016 CLINICAL DATA:  65 year old who fell off of her child at home last night injuring the left knee. Patient states she has fallen many times recently with prior left knee injuries. Initial encounter. EXAM: LEFT KNEE - COMPLETE 4+ VIEW COMPARISON:  08/22/2015. FINDINGS: Interval avulsion fracture involving the superior aspect of the medial femoral condyle, age indeterminate (Pellegrini-Stieda lesion). No fractures elsewhere. Mild medial compartment joint space narrowing, unchanged. Patellofemoral and lateral compartment joint spaces well preserved. Bone mineral density well preserved. No visible joint effusion. IMPRESSION: Age indeterminate avulsion fracture involving the superior aspect of the medial femoral condyle at the insertion of the medial collateral ligament (Pellegrini-Stieda lesion). Electronically Signed   By: Hulan Saashomas  Lawrence M.D.   On: 03/16/2016 10:51    Procedures Procedures  COORDINATION OF CARE: 10:08 AM Discussed next steps with pt. Pt verbalized understanding and is agreeable with the plan.    Medications Ordered in ED Medications - No data to display   Initial Impression / Assessment and Plan / ED Course  I have reviewed the triage vital signs and the nursing notes.  Pertinent labs & imaging results that were available during my care of the patient were reviewed by me and considered in my medical decision making (see chart for details).  Clinical Course   I personally performed the services described in this documentation, which was scribed in my presence. The recorded information has been reviewed and  considered. Cathren LaineKevin Kortne All, MD  Pt indicates only new pain from most recent fall is left knee pain, although notes hx chronic recurrent left knee pain/problems in past.  Patient denies bumping or hitting head. No headache. Spine nt.  Pt states has ride, does not have to drive.  Ultram po.  Xrays.  Recheck pt - no focal sts or focal tenderness at medial fem condyle.  Knee is grossly sstable. No effusion.  Will have f/u ortho as outpatient.  Patient currently appears stable for d/c.    Final Clinical Impressions(s) / ED Diagnoses   Final diagnoses:  None    New Prescriptions New Prescriptions   No medications on file     Cathren Laine, MD 03/16/16 1147

## 2016-04-07 ENCOUNTER — Ambulatory Visit (INDEPENDENT_AMBULATORY_CARE_PROVIDER_SITE_OTHER): Payer: Medicare HMO | Admitting: Gastroenterology

## 2016-04-07 ENCOUNTER — Encounter: Payer: Self-pay | Admitting: Gastroenterology

## 2016-04-07 VITALS — BP 124/80 | HR 78 | Temp 97.6°F | Ht 62.0 in | Wt 193.2 lb

## 2016-04-07 DIAGNOSIS — K5909 Other constipation: Secondary | ICD-10-CM | POA: Diagnosis not present

## 2016-04-07 DIAGNOSIS — K279 Peptic ulcer, site unspecified, unspecified as acute or chronic, without hemorrhage or perforation: Secondary | ICD-10-CM

## 2016-04-07 DIAGNOSIS — K5900 Constipation, unspecified: Secondary | ICD-10-CM | POA: Insufficient documentation

## 2016-04-07 NOTE — Patient Instructions (Signed)
We have scheduled you for an upper endoscopy with Dr. Jena Gaussourk to make sure your ulcer is healing/healed.   For constipation: start taking Linzess 1 capsule each morning on an empty stomach at least 30 minutes before breakfast.

## 2016-04-07 NOTE — Assessment & Plan Note (Addendum)
65 year old female with recent GI bleed in the setting of Eliquis, with findings of erosive gastropathy, non-bleeding gastric ulcers and 1 oozing gastric ulcer with adherent clot on endoscopy. Brief hospitalization thereafter with colonoscopy revealing bleeding Dieulafoy's lesion s/p clips X 3, with next colonoscopy in 5 years due to family history of colon cancer. Now with need for EGD for surveillance of ulcer healing. H.pylori serology negative, and she is without any overt GI bleeding or any concerning upper GI symptoms. Remains on Eliquis. As of note, she states she remembers the last procedure and would like to be completely "put out" this time. Will utilize Propofol this time.  Proceed with upper endoscopy in the near future with Dr. Jena Gaussourk. The risks, benefits, and alternatives have been discussed in detail with patient. They have stated understanding and desire to proceed.  PROPOFOL for sedation Reviewed with Cardiology and MAY HOLD Eliquis for 48 hours prior

## 2016-04-07 NOTE — Progress Notes (Signed)
Referring Provider: [name], MD Primary Care Physician:  Milana Obey, MD  Primary GI: Dr. Jena Gauss   Chief Complaint  Patient presents with  . Follow-up    doing well    HPI:   Megan [name] is a 65 y.o. female presenting today in hospital follow-up after GI bleed. She has a history of afib, CVA in Feb 2017 on Eliquis who presented with acute blood loss anemia and melena in July. She underwent an  EGD with erosive gastropathy, non-bleeding gastric ulcers and 1 oozing gastric ulcer with adherent clot s/p heater probe. Negative H.pylori serology. She was discharged but returned shortly thereafter with hematochezia. Due to need for anticoagulation, a colonoscopy was performed showing diverticulosis in sigmoid colon and descending colon, active bleeding Dieulafoy lesion s/p clips X 3, with next colonoscopy in 5 years due to family history. She is due for surveillance EGD now.   States she feels better today than she has all week. Trouble with lower extremity swelling earlier this week but better now. No abdominal pain. No overt GI bleeding. Remains on Eliquis. No N/V. Good appetite. No issues with reflux. No dysphagia. Taking Protonix BID. States she deals with constipation and has little rabbit balls every day. Never feels like she has a good cleaning out. States she woke up and started talking during the last procedure. Wants to be put out this time.   Past Medical History:  Diagnosis Date  . Agoraphobia with panic disorder   . Anxiety   . Chronic pain 03/08/15   Hydrocodone-APAP 10/325 mg, # 120 q month  . Colon polyps   . Complication of anesthesia   . Depression   . DM (diabetes mellitus) (HCC)   . HTN (hypertension)   . Obesity   . PONV (postoperative nausea and vomiting)   . Stroke [facility])     Past Surgical History:  Procedure Laterality Date  . ABDOMINAL HYSTERECTOMY  15 yrs ago   with bso at aph  . CARDIAC CATHETERIZATION  2004   normal  .  CHOLECYSTECTOMY  25 yrs ago   [facility]  . COLONOSCOPY  05/04/11   left-sided colonic diverticulosis  . COLONOSCOPY N/A 01/27/2016   diverticulosis in sigmoid colon and descending colon, active bleeding Dieulafoy lesion s/p clips X 3, next colonoscopy in 5 years due to family history of colon cancer    . ESOPHAGOGASTRODUODENOSCOPY  05/04/2011   Abnormal distal esophagus, biopsies consistent with inflammation due to acid reflux. No evidence of Barrett's. No H. pylori  . ESOPHAGOGASTRODUODENOSCOPY N/A 01/22/2016   erosive gastropathy, non-bleeding gastric ulcers and 1 oozing gastric ulcer with adherent clot s/p heater probe. Negative H.pylori serology.   Marland Kitchen RADIOLOGY WITH ANESTHESIA N/A 08/20/2015   Procedure: RADIOLOGY WITH ANESTHESIA;  Surgeon: Julieanne Cotton, MD;  Location: [facility] OR;  Service: Radiology;  Laterality: N/A;    Current Outpatient Prescriptions  Medication Sig Dispense Refill  . apixaban (ELIQUIS) 5 MG TABS tablet Take 1 tablet (5 mg total) by mouth 2 (two) times daily. Restart on 7/14 60 tablet 2  . atorvastatin (LIPITOR) 20 MG tablet Take 20 mg by mouth at bedtime.     . Cholecalciferol (VITAMIN D-3 PO) Take 1 tablet by mouth daily.    . diazepam (VALIUM) 10 MG tablet Take 1 tablet by mouth at bedtime as needed for sleep.     . enalapril (VASOTEC) 20 MG tablet Take 20 mg by mouth 2 (two) times daily.     Marland Kitchen  glimepiride (AMARYL) 2 MG tablet Take 2 mg by mouth daily with breakfast.    . glipiZIDE (GLUCOTROL) 10 MG tablet Take 10 mg by mouth every morning. In the AM    . hydrochlorothiazide (HYDRODIURIL) 25 MG tablet Take 25 mg by mouth daily.      Marland Kitchen. HYDROcodone-acetaminophen (NORCO) 10-325 MG tablet Take 1 tablet by mouth every 6 (six) hours as needed for moderate pain. 20 tablet 0  . levothyroxine (SYNTHROID, LEVOTHROID) 25 MCG tablet Take 25 mcg by mouth daily.    . metFORMIN (GLUCOPHAGE) 500 MG tablet Take 1,000 mg by mouth 2 (two) times daily with a meal.     . metoprolol (TOPROL-XL)  50 MG 24 hr tablet Take 50 mg by mouth daily with lunch.     . pantoprazole (PROTONIX) 40 MG tablet Take 1 tablet (40 mg total) by mouth 2 (two) times daily before a meal. 30 tablet 3  . pioglitazone (ACTOS) 15 MG tablet Take 15 mg by mouth daily.     No current facility-administered medications for this visit.     Allergies as of 04/07/2016 - Review Complete 04/07/2016  Allergen Reaction Noted  . Naproxen Other (See Comments) 04/05/2011    Family History  Problem Relation Age of Onset  . Colon cancer Paternal Aunt     2, colon cancer  . Colon cancer Paternal Uncle     6, colon cancer  . Colon cancer Father     deceased, age 66  . Colon cancer      numerous cousins died before age 66 with colon cancer  . Colon cancer Sister     deceased, age 66  . Anesthesia problems Neg Hx   . Hypotension Neg Hx   . Malignant hyperthermia Neg Hx   . Pseudochol deficiency Neg Hx     Social History   Social History  . Marital status: Married    Spouse name: N/A  . Number of children: 3  . Years of education: N/A   Occupational History  . disability Not Employed   Social History Main Topics  . Smoking status: Former Smoker    Packs/day: 0.25    Years: 15.00    Types: Cigarettes    Start date: 07/17/1969    Quit date: 07/18/1971  . Smokeless tobacco: Current User    Types: Snuff     Comment: never more than 1-2 cig/day in entire life  . Alcohol use No  . Drug use: No  . Sexual activity: Yes    Birth control/ protection: Surgical   Other Topics Concern  . None   Social History Narrative   No contact with children since they left home at age 65. Doesn't know where they are.    Review of Systems: Gen: Denies fever, chills, anorexia. Denies fatigue, weakness, weight loss.  CV: Denies chest pain, palpitations, syncope, peripheral edema, and claudication. Resp: Denies dyspnea at rest, cough, wheezing, coughing up blood, and pleurisy. GI: see HPI  Derm: Denies rash, itching, dry  skin Psych: Denies depression, anxiety, memory loss, confusion. No homicidal or suicidal ideation.  Heme: see HPI   Physical Exam: BP 124/80   Pulse 78   Temp 97.6 F (36.4 C) (Oral)   Ht 5\' 2"  (1.575 m)   Wt 193 lb 3.2 oz (87.6 kg)   BMI 35.34 kg/m  General:   Alert and oriented. No distress noted. Pleasant and cooperative.  Head:  Normocephalic and atraumatic. Eyes:  Conjuctiva clear without scleral icterus.  Mouth:  Oral mucosa pink and moist.  Heart:  S1, S2 present without murmurs Abdomen:  +BS, soft, non-tender and non-distended. Limited exam with patient sitting up in chair. Unable to get on table due to height of table.  Extremities:  Without edema. Neurologic:  Alert and  oriented x4 Psych:  Alert and cooperative. Normal mood and affect.

## 2016-04-07 NOTE — Assessment & Plan Note (Signed)
Start Linzess 72 mcg once daily. Samples provided. Next colonoscopy in 5 years due to family history of colon cancer.

## 2016-04-10 ENCOUNTER — Telehealth: Payer: Self-pay | Admitting: Gastroenterology

## 2016-04-10 ENCOUNTER — Other Ambulatory Visit: Payer: Self-pay

## 2016-04-10 DIAGNOSIS — K279 Peptic ulcer, site unspecified, unspecified as acute or chronic, without hemorrhage or perforation: Secondary | ICD-10-CM

## 2016-04-10 NOTE — Telephone Encounter (Signed)
Per Cardiology, may hold Eliquis 48 hours prior. Please make sure patient is aware.   Gelene MinkAnna W. Boone, ANP-BC [facility]

## 2016-04-10 NOTE — Telephone Encounter (Signed)
Pt is aware and is also on instructions that were mailed to her.

## 2016-04-10 NOTE — Progress Notes (Signed)
cc'ed to pcp °

## 2016-04-11 ENCOUNTER — Telehealth: Payer: Self-pay

## 2016-04-11 NOTE — Telephone Encounter (Signed)
Called pt to confirm she is aware of stopping Eliquis after dose on 04/24/16 and it was ok with Dr. Purvis SheffieldKoneswaran.

## 2016-04-21 NOTE — Patient Instructions (Signed)
[place]  04/21/2016     @PREFPERIOPPHARMACY @   Your procedure is scheduled on  04/27/2016   Report to [facility] at  615  A.M.  Call this number if you have problems the morning of surgery:  [phone]   Remember:  Do not eat food or drink liquids after midnight.  Take these medicines the morning of surgery with A SIP OF WATER  Valium, vasotec, norco, levothyroxine, metoprolol, protonix.   Do not wear jewelry, make-up or nail polish.  Do not wear lotions, powders, or perfumes, or deoderant.  Do not shave 48 hours prior to surgery.  Men may shave face and neck.  Do not bring valuables to the hospital.  [facility] is not responsible for any belongings or valuables.  Contacts, dentures or bridgework may not be worn into surgery.  Leave your suitcase in the car.  After surgery it may be brought to your room.  For patients admitted to the hospital, discharge time will be determined by your treatment team.  Patients discharged the day of surgery will not be allowed to drive home.   Name and phone number of your driver:   family Special instructions:  Follow the diet instructions given to you by Dr Luvenia Starch office.  Please read over the following fact sheets that you were given. Anesthesia Post-op Instructions and Care and Recovery After Surgery      Esophagogastroduodenoscopy Esophagogastroduodenoscopy (EGD) is a procedure that is used to examine the lining of the esophagus, stomach, and first part of the small intestine (duodenum). A long, flexible, lighted tube with a camera attached (endoscope) is inserted down the throat to view these organs. This procedure is done to detect problems or abnormalities, such as inflammation, bleeding, ulcers, or growths, in order to treat them. The procedure lasts 5-20 minutes. It is usually an outpatient procedure, but it may need to be performed in a hospital in emergency cases. LET [facility] CARE PROVIDER KNOW  ABOUT:  Any allergies you have.  All medicines you are taking, including vitamins, herbs, eye drops, creams, and over-the-counter medicines.  Previous problems you or members of your family have had with the use of anesthetics.  Any blood disorders you have.  Previous surgeries you have had.  Medical conditions you have. RISKS AND COMPLICATIONS Generally, this is a safe procedure. However, problems can occur and include:  Infection.  Bleeding.  Tearing (perforation) of the esophagus, stomach, or duodenum.  Difficulty breathing or not being able to breathe.  Excessive sweating.  Spasms of the larynx.  Slowed heartbeat.  Low blood pressure. BEFORE THE PROCEDURE  Do not eat or drink anything after midnight on the night before the procedure or as directed by your health care provider.  Do not take your regular medicines before the procedure if your health care provider asks you not to. Ask your health care provider about changing or stopping those medicines.  If you wear dentures, be prepared to remove them before the procedure.  Arrange for someone to drive you home after the procedure. PROCEDURE  A numbing medicine (local anesthetic) may be sprayed in your throat for comfort and to stop you from gagging or coughing.  You will have an IV tube inserted in a vein in your hand or arm. You will receive medicines and fluids through this tube.  You will be given a medicine to relax you (sedative).  A pain reliever will be given through the  IV tube.  A mouth guard may be placed in your mouth to protect your teeth and to keep you from biting on the endoscope.  You will be asked to lie on your left side.  The endoscope will be inserted down your throat and into your esophagus, stomach, and duodenum.  Air will be put through the endoscope to allow your health care provider to clearly view the lining of your esophagus.  The lining of your esophagus, stomach, and duodenum  will be examined. During the exam, your health care provider may:  Remove tissue to be examined under a microscope (biopsy) for inflammation, infection, or other medical problems.  Remove growths.  Remove objects (foreign bodies) that are stuck.  Treat any bleeding with medicines or other devices that stop tissues from bleeding (hot cautery, clipping devices).  Widen (dilate) or stretch narrowed areas of your esophagus and stomach.  The endoscope will be withdrawn. AFTER THE PROCEDURE  You will be taken to a recovery area for observation. Your blood pressure, heart rate, breathing rate, and blood oxygen level will be monitored often until the medicines you were given have worn off.  Do not eat or drink anything until the numbing medicine has worn off and your gag reflex has returned. You may choke.  Your health care provider should be able to discuss his or her findings with you. It will take longer to discuss the test results if any biopsies were taken.   This information is not intended to replace advice given to you by your health care provider. Make sure you discuss any questions you have with your health care provider.   Document Released: 11/03/2004 Document Revised: 07/24/2014 Document Reviewed: 06/05/2012 Elsevier Interactive Patient Education 2016 [employer] Esophagogastroduodenoscopy, Care After Refer to this sheet in the next few weeks. These instructions provide you with information about caring for yourself after your procedure. Your health care provider may also give you more specific instructions. Your treatment has been planned according to current medical practices, but problems sometimes occur. Call your health care provider if you have any problems or questions after your procedure. WHAT TO EXPECT AFTER THE PROCEDURE After your procedure, it is typical to feel:  Soreness in your throat.  Pain with swallowing.  Sick to your stomach  (nauseous).  Bloated.  Dizzy.  Fatigued. HOME CARE INSTRUCTIONS  Do not eat or drink anything until the numbing medicine (local anesthetic) has worn off and your gag reflex has returned. You will know that the local anesthetic has worn off when you can swallow comfortably.  Do not drive or operate machinery until directed by your health care provider.  Take medicines only as directed by your health care provider. SEEK MEDICAL CARE IF:   You cannot stop coughing.  You are not urinating at all or less than usual. SEEK IMMEDIATE MEDICAL CARE IF:  You have difficulty swallowing.  You cannot eat or drink.  You have worsening throat or chest pain.  You have dizziness or lightheadedness or you faint.  You have nausea or vomiting.  You have chills.  You have a fever.  You have severe abdominal pain.  You have black, tarry, or bloody stools.   This information is not intended to replace advice given to you by your health care provider. Make sure you discuss any questions you have with your health care provider.   Document Released: 06/19/2012 Document Revised: 07/24/2014 Document Reviewed: 06/19/2012 Elsevier Interactive Patient Education 2016 [employer] PATIENT INSTRUCTIONS POST-ANESTHESIA  IMMEDIATELY FOLLOWING SURGERY:  Do not drive or operate machinery for the first twenty four hours after surgery.  Do not make any important decisions for twenty four hours after surgery or while taking narcotic pain medications or sedatives.  If you develop intractable nausea and vomiting or a severe headache please notify your doctor immediately.  FOLLOW-UP:  Please make an appointment with your surgeon as instructed. You do not need to follow up with anesthesia unless specifically instructed to do so.  WOUND CARE INSTRUCTIONS (if applicable):  Keep a dry clean dressing on the anesthesia/puncture wound site if there is drainage.  Once the wound has quit draining you may leave it open  to air.  Generally you should leave the bandage intact for twenty four hours unless there is drainage.  If the epidural site drains for more than 36-48 hours please call the anesthesia department.  QUESTIONS?:  Please feel free to call your physician or the hospital operator if you have any questions, and they will be happy to assist you.

## 2016-04-24 ENCOUNTER — Encounter (HOSPITAL_COMMUNITY)
Admission: RE | Admit: 2016-04-24 | Discharge: 2016-04-24 | Disposition: A | Discharge status: Home / Self Care | Payer: Medicare HMO | Source: Ambulatory Visit | Attending: Internal Medicine | Admitting: Internal Medicine

## 2016-04-24 ENCOUNTER — Encounter (HOSPITAL_COMMUNITY): Payer: Self-pay

## 2016-04-24 ENCOUNTER — Telehealth: Payer: Self-pay | Admitting: Internal Medicine

## 2016-04-24 DIAGNOSIS — I4891 Unspecified atrial fibrillation: Secondary | ICD-10-CM | POA: Diagnosis not present

## 2016-04-24 DIAGNOSIS — F329 Major depressive disorder, single episode, unspecified: Secondary | ICD-10-CM | POA: Diagnosis not present

## 2016-04-24 DIAGNOSIS — F419 Anxiety disorder, unspecified: Secondary | ICD-10-CM | POA: Diagnosis not present

## 2016-04-24 DIAGNOSIS — Z7984 Long term (current) use of oral hypoglycemic drugs: Secondary | ICD-10-CM | POA: Diagnosis not present

## 2016-04-24 DIAGNOSIS — E669 Obesity, unspecified: Secondary | ICD-10-CM | POA: Diagnosis not present

## 2016-04-24 DIAGNOSIS — R12 Heartburn: Secondary | ICD-10-CM | POA: Diagnosis not present

## 2016-04-24 DIAGNOSIS — Z87891 Personal history of nicotine dependence: Secondary | ICD-10-CM | POA: Diagnosis not present

## 2016-04-24 DIAGNOSIS — Z90710 Acquired absence of both cervix and uterus: Secondary | ICD-10-CM | POA: Diagnosis not present

## 2016-04-24 DIAGNOSIS — Z9049 Acquired absence of other specified parts of digestive tract: Secondary | ICD-10-CM | POA: Diagnosis not present

## 2016-04-24 DIAGNOSIS — Z7901 Long term (current) use of anticoagulants: Secondary | ICD-10-CM | POA: Diagnosis not present

## 2016-04-24 DIAGNOSIS — Z888 Allergy status to other drugs, medicaments and biological substances status: Secondary | ICD-10-CM | POA: Diagnosis not present

## 2016-04-24 DIAGNOSIS — I10 Essential (primary) hypertension: Secondary | ICD-10-CM | POA: Diagnosis not present

## 2016-04-24 DIAGNOSIS — E119 Type 2 diabetes mellitus without complications: Secondary | ICD-10-CM | POA: Diagnosis not present

## 2016-04-24 DIAGNOSIS — Z6835 Body mass index (BMI) 35.0-35.9, adult: Secondary | ICD-10-CM | POA: Diagnosis not present

## 2016-04-24 DIAGNOSIS — Z79899 Other long term (current) drug therapy: Secondary | ICD-10-CM | POA: Diagnosis not present

## 2016-04-24 DIAGNOSIS — Z8711 Personal history of peptic ulcer disease: Secondary | ICD-10-CM | POA: Diagnosis not present

## 2016-04-24 DIAGNOSIS — Z800 Family history of malignant neoplasm of digestive organs: Secondary | ICD-10-CM | POA: Diagnosis not present

## 2016-04-24 DIAGNOSIS — K5900 Constipation, unspecified: Secondary | ICD-10-CM | POA: Diagnosis not present

## 2016-04-24 DIAGNOSIS — F4000 Agoraphobia, unspecified: Secondary | ICD-10-CM | POA: Diagnosis not present

## 2016-04-24 DIAGNOSIS — Z8673 Personal history of transient ischemic attack (TIA), and cerebral infarction without residual deficits: Secondary | ICD-10-CM | POA: Diagnosis not present

## 2016-04-24 DIAGNOSIS — Z86010 Personal history of colonic polyps: Secondary | ICD-10-CM | POA: Diagnosis not present

## 2016-04-24 HISTORY — DX: Unspecified osteoarthritis, unspecified site: M19.90

## 2016-04-24 HISTORY — DX: Gastro-esophageal reflux disease without esophagitis: K21.9

## 2016-04-24 HISTORY — DX: Hypothyroidism, unspecified: E03.9

## 2016-04-24 LAB — CBC WITH DIFFERENTIAL/PLATELET
BASOS ABS: 0 10*3/uL (ref 0.0–0.1)
Basophils Relative: 0 %
EOS ABS: 0.1 10*3/uL (ref 0.0–0.7)
Eosinophils Relative: 1 %
HCT: 33.8 % — ABNORMAL LOW (ref 36.0–46.0)
HEMOGLOBIN: 11.2 g/dL — AB (ref 12.0–15.0)
LYMPHS PCT: 25 %
Lymphs Abs: 1.6 10*3/uL (ref 0.7–4.0)
MCH: 30.2 pg (ref 26.0–34.0)
MCHC: 33.1 g/dL (ref 30.0–36.0)
MCV: 91.1 fL (ref 78.0–100.0)
MONOS PCT: 3 %
Monocytes Absolute: 0.2 10*3/uL (ref 0.1–1.0)
NEUTROS PCT: 71 %
Neutro Abs: 4.5 10*3/uL (ref 1.7–7.7)
PLATELETS: 216 10*3/uL (ref 150–400)
RBC: 3.71 MIL/uL — AB (ref 3.87–5.11)
RDW: 12.2 % (ref 11.5–15.5)
WBC: 6.4 10*3/uL (ref 4.0–10.5)

## 2016-04-24 LAB — BASIC METABOLIC PANEL
Anion gap: 9 (ref 5–15)
BUN: 47 mg/dL — AB (ref 6–20)
CHLORIDE: 105 mmol/L (ref 101–111)
CO2: 21 mmol/L — ABNORMAL LOW (ref 22–32)
CREATININE: 1.59 mg/dL — AB (ref 0.44–1.00)
Calcium: 9.8 mg/dL (ref 8.9–10.3)
GFR, EST AFRICAN AMERICAN: 38 mL/min — AB (ref >60)
GFR, EST NON AFRICAN AMERICAN: 33 mL/min — AB (ref >60)
Glucose, Bld: 127 mg/dL — ABNORMAL HIGH (ref 65–99)
POTASSIUM: 5.3 mmol/L — AB (ref 3.5–5.1)
SODIUM: 135 mmol/L (ref 135–145)

## 2016-04-24 NOTE — Telephone Encounter (Signed)
Irwin BrakemanKim Lamberth, RN in Short Stay called to say that the patient is there for her pre op and she had labs done that were ordered by her PCP but the lab will not release them to her because the PCP office is closed today. Kim doesn't want to have to stick the patient twice if the labs have already been done. Is there a way we could find out what was ordered and let Selena BattenKim know? Fax [phone]

## 2016-04-24 NOTE — Pre-Procedure Instructions (Signed)
Patient given information to sign up for my chart at home. 

## 2016-04-24 NOTE — Telephone Encounter (Signed)
We did not order them so I can not see what she had done. She will have to be a re stick.

## 2016-04-27 ENCOUNTER — Ambulatory Visit (HOSPITAL_COMMUNITY): Payer: Medicare HMO | Admitting: Anesthesiology

## 2016-04-27 ENCOUNTER — Encounter (HOSPITAL_COMMUNITY): Payer: Self-pay | Admitting: *Deleted

## 2016-04-27 ENCOUNTER — Encounter (HOSPITAL_COMMUNITY)
Admission: RE | Disposition: A | Discharge status: Home / Self Care | Payer: Self-pay | Source: Ambulatory Visit | Attending: Internal Medicine

## 2016-04-27 ENCOUNTER — Ambulatory Visit (HOSPITAL_COMMUNITY)
Admission: RE | Admit: 2016-04-27 | Discharge: 2016-04-27 | Disposition: A | Discharge status: Home / Self Care | Payer: Medicare HMO | Source: Ambulatory Visit | Attending: Internal Medicine | Admitting: Internal Medicine

## 2016-04-27 DIAGNOSIS — Z7901 Long term (current) use of anticoagulants: Secondary | ICD-10-CM | POA: Insufficient documentation

## 2016-04-27 DIAGNOSIS — K5900 Constipation, unspecified: Secondary | ICD-10-CM | POA: Insufficient documentation

## 2016-04-27 DIAGNOSIS — Z9049 Acquired absence of other specified parts of digestive tract: Secondary | ICD-10-CM | POA: Insufficient documentation

## 2016-04-27 DIAGNOSIS — Z8711 Personal history of peptic ulcer disease: Secondary | ICD-10-CM | POA: Insufficient documentation

## 2016-04-27 DIAGNOSIS — I10 Essential (primary) hypertension: Secondary | ICD-10-CM | POA: Insufficient documentation

## 2016-04-27 DIAGNOSIS — E119 Type 2 diabetes mellitus without complications: Secondary | ICD-10-CM | POA: Insufficient documentation

## 2016-04-27 DIAGNOSIS — Z90710 Acquired absence of both cervix and uterus: Secondary | ICD-10-CM | POA: Insufficient documentation

## 2016-04-27 DIAGNOSIS — I4891 Unspecified atrial fibrillation: Secondary | ICD-10-CM | POA: Insufficient documentation

## 2016-04-27 DIAGNOSIS — F4000 Agoraphobia, unspecified: Secondary | ICD-10-CM | POA: Insufficient documentation

## 2016-04-27 DIAGNOSIS — Z87891 Personal history of nicotine dependence: Secondary | ICD-10-CM | POA: Insufficient documentation

## 2016-04-27 DIAGNOSIS — Z6835 Body mass index (BMI) 35.0-35.9, adult: Secondary | ICD-10-CM | POA: Insufficient documentation

## 2016-04-27 DIAGNOSIS — E669 Obesity, unspecified: Secondary | ICD-10-CM | POA: Insufficient documentation

## 2016-04-27 DIAGNOSIS — Z8673 Personal history of transient ischemic attack (TIA), and cerebral infarction without residual deficits: Secondary | ICD-10-CM | POA: Diagnosis not present

## 2016-04-27 DIAGNOSIS — F419 Anxiety disorder, unspecified: Secondary | ICD-10-CM | POA: Insufficient documentation

## 2016-04-27 DIAGNOSIS — F329 Major depressive disorder, single episode, unspecified: Secondary | ICD-10-CM | POA: Insufficient documentation

## 2016-04-27 DIAGNOSIS — Z86010 Personal history of colonic polyps: Secondary | ICD-10-CM | POA: Insufficient documentation

## 2016-04-27 DIAGNOSIS — Z800 Family history of malignant neoplasm of digestive organs: Secondary | ICD-10-CM | POA: Insufficient documentation

## 2016-04-27 DIAGNOSIS — Z888 Allergy status to other drugs, medicaments and biological substances status: Secondary | ICD-10-CM | POA: Insufficient documentation

## 2016-04-27 DIAGNOSIS — R12 Heartburn: Secondary | ICD-10-CM | POA: Insufficient documentation

## 2016-04-27 DIAGNOSIS — Z7984 Long term (current) use of oral hypoglycemic drugs: Secondary | ICD-10-CM | POA: Insufficient documentation

## 2016-04-27 DIAGNOSIS — Z79899 Other long term (current) drug therapy: Secondary | ICD-10-CM | POA: Insufficient documentation

## 2016-04-27 HISTORY — PX: ESOPHAGOGASTRODUODENOSCOPY (EGD) WITH PROPOFOL: SHX5813

## 2016-04-27 LAB — GLUCOSE, CAPILLARY
GLUCOSE-CAPILLARY: 111 mg/dL — AB (ref 65–99)
Glucose-Capillary: 69 mg/dL (ref 65–99)
Glucose-Capillary: 105 mg/dL — ABNORMAL HIGH (ref 65–99)

## 2016-04-27 SURGERY — ESOPHAGOGASTRODUODENOSCOPY (EGD) WITH PROPOFOL
Anesthesia: Monitor Anesthesia Care

## 2016-04-27 MED ORDER — HYDROMORPHONE HCL 1 MG/ML IJ SOLN: 0.2500 mg | INTRAMUSCULAR | Status: DC | PRN

## 2016-04-27 MED ORDER — LACTATED RINGERS IV SOLN
INTRAVENOUS | Status: DC
  Administered 2016-04-27: 07:00:00 via INTRAVENOUS

## 2016-04-27 MED ORDER — LIDOCAINE VISCOUS 2 % MT SOLN
3.0000 mL | Freq: Once | OROMUCOSAL | Status: AC
  Administered 2016-04-27: 3 mL via OROMUCOSAL

## 2016-04-27 MED ORDER — MIDAZOLAM HCL 2 MG/2ML IJ SOLN
1.0000 mg | INTRAMUSCULAR | Status: DC | PRN
  Administered 2016-04-27: 2 mg via INTRAVENOUS

## 2016-04-27 MED ORDER — PROPOFOL 10 MG/ML IV BOLUS
INTRAVENOUS | Status: DC | PRN
  Administered 2016-04-27: 10 mg via INTRAVENOUS

## 2016-04-27 MED ORDER — PROPOFOL 10 MG/ML IV BOLUS
INTRAVENOUS | Status: AC
  Filled 2016-04-27: qty 100

## 2016-04-27 MED ORDER — LIDOCAINE VISCOUS 2 % MT SOLN
3.0000 mL | Freq: Once | OROMUCOSAL | Status: DC
  Administered 2016-04-27: 3 mL via OROMUCOSAL

## 2016-04-27 MED ORDER — LIDOCAINE HCL (CARDIAC) 10 MG/ML IV SOLN
INTRAVENOUS | Status: DC | PRN
  Administered 2016-04-27: 50 mg via INTRAVENOUS

## 2016-04-27 MED ORDER — LIDOCAINE HCL (PF) 1 % IJ SOLN
INTRAMUSCULAR | Status: AC
  Filled 2016-04-27: qty 5

## 2016-04-27 MED ORDER — MIDAZOLAM HCL 2 MG/2ML IJ SOLN
INTRAMUSCULAR | Status: AC
  Filled 2016-04-27: qty 2

## 2016-04-27 MED ORDER — LIDOCAINE VISCOUS 2 % MT SOLN
OROMUCOSAL | Status: AC
  Filled 2016-04-27: qty 15

## 2016-04-27 MED ORDER — FENTANYL CITRATE (PF) 100 MCG/2ML IJ SOLN
25.0000 ug | INTRAMUSCULAR | Status: AC | PRN
  Administered 2016-04-27 (×2): 25 ug via INTRAVENOUS

## 2016-04-27 MED ORDER — DEXTROSE 50 % IV SOLN
12.5000 g | Freq: Once | INTRAVENOUS | Status: AC
  Administered 2016-04-27: 12.5 g via INTRAVENOUS

## 2016-04-27 MED ORDER — PROPOFOL 500 MG/50ML IV EMUL
INTRAVENOUS | Status: DC | PRN
  Administered 2016-04-27: 75 ug/kg/min via INTRAVENOUS

## 2016-04-27 MED ORDER — DEXTROSE 50 % IV SOLN
INTRAVENOUS | Status: AC
  Filled 2016-04-27: qty 50

## 2016-04-27 MED ORDER — FENTANYL CITRATE (PF) 100 MCG/2ML IJ SOLN
INTRAMUSCULAR | Status: AC
  Filled 2016-04-27: qty 2

## 2016-04-27 NOTE — Anesthesia Postprocedure Evaluation (Signed)
Anesthesia Post Note  Patient: Megan [name]  Procedure(s) Performed: Procedure(s) (LRB): ESOPHAGOGASTRODUODENOSCOPY (EGD) WITH PROPOFOL (N/A)  Patient location during evaluation: PACU Anesthesia Type: MAC Level of consciousness: awake and alert Pain management: satisfactory to patient Vital Signs Assessment: post-procedure vital signs reviewed and stable Respiratory status: spontaneous breathing and patient connected to nasal cannula oxygen Cardiovascular status: stable Anesthetic complications: no    Last Vitals:  Vitals:   04/27/16 0720 04/27/16 0725  BP:  (!) 105/56  Pulse:    Resp: 15 14  Temp:      Last Pain:  Vitals:   04/27/16 0635  TempSrc: Oral                 Jackob Crookston

## 2016-04-27 NOTE — Discharge Instructions (Signed)
EGD Discharge instructions Please read the instructions outlined below and refer to this sheet in the next few weeks. These discharge instructions provide you with general information on caring for yourself after you leave the hospital. Your doctor may also give you specific instructions. While your treatment has been planned according to the most current medical practices available, unavoidable complications occasionally occur. If you have any problems or questions after discharge, please call your doctor.  Dr Jena Gaussourk [phone] ACTIVITY  You may resume your regular activity but move at a slower pace for the next 24 hours.   Take frequent rest periods for the next 24 hours.   Walking will help expel (get rid of) the air and reduce the bloated feeling in your abdomen.   No driving for 24 hours (because of the anesthesia (medicine) used during the test).   You may shower.   Do not sign any important legal documents or operate any machinery for 24 hours (because of the anesthesia used during the test).  NUTRITION  Drink plenty of fluids.   You may resume your normal diet.   Begin with a light meal and progress to your normal diet.   Avoid alcoholic beverages for 24 hours or as instructed by your caregiver.  MEDICATIONS  You may resume your normal medications unless your caregiver tells you otherwise.  WHAT YOU CAN EXPECT TODAY  You may experience abdominal discomfort such as a feeling of fullness or gas pains.  FOLLOW-UP  Your doctor will discuss the results of your test with you.  SEEK IMMEDIATE MEDICAL ATTENTION IF ANY OF THE FOLLOWING OCCUR:  Excessive nausea (feeling sick to your stomach) and/or vomiting.   Severe abdominal pain and distention (swelling).   Trouble swallowing.   Temperature over 101 F (37.8 C).   Rectal bleeding or vomiting of blood.    May decrease Protonix to 40 mg once daily  Office visit with us in 6 months.  With Lewie LoronAnna Boone, October 26, 2016  @ 930  Continue Linzess for constipation

## 2016-04-27 NOTE — Interval H&P Note (Signed)
History and Physical Interval Note:  04/27/2016 7:24 AM  Megan [name]  has presented today for surgery, with the diagnosis of PUD  The various methods of treatment have been discussed with the patient and family. After consideration of risks, benefits and other options for treatment, the patient has consented to  Procedure(s) with comments: ESOPHAGOGASTRODUODENOSCOPY (EGD) WITH PROPOFOL (N/A) - 7:30 AM as a surgical intervention .  [name] history has been reviewed, patient examined, no change in status, stable for surgery.  I have reviewed the patient's chart and labs.  Questions were answered to the patient's satisfaction.    No change. Remains on anticoagulation. Follow-up EGD per plan. The risks, benefits, limitations, alternatives and imponderables have been reviewed with the patient. Potential for esophageal dilation, biopsy, etc. have also been reviewed.  Questions have been answered. All parties agreeable.   Eula Listenobert Rourk

## 2016-04-27 NOTE — Transfer of Care (Signed)
Immediate Anesthesia Transfer of Care Note  Patient: Megan [name]  Procedure(s) Performed: Procedure(s) with comments: ESOPHAGOGASTRODUODENOSCOPY (EGD) WITH PROPOFOL (N/A) - 7:30 AM  Patient Location: PACU  Anesthesia Type:MAC  Level of Consciousness: awake and patient cooperative  Airway & Oxygen Therapy: Patient Spontanous Breathing and Patient connected to nasal cannula oxygen  Post-op Assessment: Report given to RN  Post vital signs: Reviewed and stable  Last Vitals:  Vitals:   04/27/16 0720 04/27/16 0725  BP:  (!) 105/56  Pulse:    Resp: 15 14  Temp:      Last Pain:  Vitals:   04/27/16 0635  TempSrc: Oral      Patients Stated Pain Goal: 6 (04/27/16 [id number])  Complications: No apparent anesthesia complications

## 2016-04-27 NOTE — Op Note (Signed)
[facility] Patient Name: Megan [name] Procedure Date: 04/27/2016 7:20 AM MRN: [id number] Date of Birth: [birth year] Attending MD: Gennette Pac , MD CSN: [id number] Age: 65 Admit Type: Outpatient Procedure:                Upper GI endoscopy -Surveillance Indications:              Surveillance procedure, Heartburn Providers:                Gennette Pac, MD, Toniann Fail, RN,                            Nena Polio, RN, Burke Keels, Technician Referring MD:              Medicines:                Propofol per Anesthesia Complications:            No immediate complications. Estimated Blood Loss:     Estimated blood loss: none. Procedure:                Pre-Anesthesia Assessment:                           - Prior to the procedure, a History and Physical                            was performed, and patient medications and                            allergies were reviewed. [name] tolerance of                            previous anesthesia was also reviewed. The risks                            and benefits of the procedure and the sedation                            options and risks were discussed with the patient.                            All questions were answered, and informed consent                            was obtained. Prior Anticoagulants: The patient has                            taken Eliquis (apixaban), last dose was day of                            procedure. ASA Grade Assessment: III - A patient                            with severe systemic disease. After reviewing the  risks and benefits, the patient was deemed in                            satisfactory condition to undergo the procedure.                           After obtaining informed consent, the endoscope was                            passed under direct vision. Throughout the                            procedure, the patient's blood pressure,  pulse, and                            oxygen saturations were monitored continuously.The                            patient tolerated the procedure well. The upper GI                            endoscopy was accomplished without difficulty. The                            patient tolerated the procedure well. The [id number]                            ([id number] scope was introduced through the mouth,                            and advanced to the second part of duodenum. Scope In: 7:40:32 AM Scope Out: 7:44:01 AM Total Procedure Duration: 0 hours 3 minutes 29 seconds  Findings:      The examined esophagus was normal aside from an irregular Z line. The       Stomach is empty. Antral scar. No ulcer. Patent pylorus. Normal first       and second portion of the duodenum..      The duodenal bulb and second portion of the duodenum were normal. Impression:               gastric scar / gastric ulcers completely healed                           - Normal esophagus.                           - No specimens collected. Moderate Sedation:      Moderate (conscious) sedation was personally administered by an       anesthesia professional. The following parameters were monitored: oxygen       saturation, heart rate, blood pressure, respiratory rate, EKG, adequacy       of pulmonary ventilation, and response to care. Total physician       intraservice time was 14 minutes. Recommendation:           - Patient has a contact number available for  emergencies. The signs and symptoms of potential                            delayed complications were discussed with the                            patient. Return to normal activities tomorrow.                            Written discharge instructions were provided to the                            patient.                           - Resume previous diet.                           - Continue present medications. Decrease Protonix                             to 40 mg once daily. Continue Linzess daily for                            constipation. Avoid nonsteroidal agents.                           - No repeat upper endoscopy.                           - Return to GI office in 6 months. Procedure Code(s):        --- Professional ---                           [phone], Esophagogastroduodenoscopy, flexible,                            transoral; diagnostic, including collection of                            specimen(s) by brushing or washing, when performed                            (separate procedure) Diagnosis Code(s):        --- Professional ---                           R12, Heartburn CPT copyright 2016 [employer]. All rights reserved. The codes documented in this report are preliminary and upon coder review may  be revised to meet current compliance requirements. Gerrit Friends. Alaiyah Bollman, MD Gennette Pac, MD 04/27/2016 2:27:34 PM This report has been signed electronically. Number of Addenda: 0

## 2016-04-27 NOTE — Anesthesia Procedure Notes (Signed)
Procedure Name: MAC Date/Time: 04/27/2016 7:29 AM Performed by: Franco NonesYATES, Tamira Ryland S Pre-anesthesia Checklist: Patient identified, Emergency Drugs available, Suction available, Timeout performed and Patient being monitored Patient Re-evaluated:Patient Re-evaluated prior to inductionOxygen Delivery Method: Non-rebreather mask

## 2016-04-27 NOTE — H&P (View-Only) (Signed)
    Referring Provider: Knowlton, Steve, MD Primary Care Physician:  Stephen D Knowlton, MD  Primary GI: Dr. Rourk   Chief Complaint  Patient presents with  . Follow-up    doing well    HPI:   Megan [name] is a 65 y.o. female presenting today in hospital follow-up after GI bleed. She has a history of afib, CVA in Feb 2017 on Eliquis who presented with acute blood loss anemia and melena in July. She underwent an  EGD with erosive gastropathy, non-bleeding gastric ulcers and 1 oozing gastric ulcer with adherent clot s/p heater probe. Negative H.pylori serology. She was discharged but returned shortly thereafter with hematochezia. Due to need for anticoagulation, a colonoscopy was performed showing diverticulosis in sigmoid colon and descending colon, active bleeding Dieulafoy lesion s/p clips X 3, with next colonoscopy in 5 years due to family history. She is due for surveillance EGD now.   States she feels better today than she has all week. Trouble with lower extremity swelling earlier this week but better now. No abdominal pain. No overt GI bleeding. Remains on Eliquis. No N/V. Good appetite. No issues with reflux. No dysphagia. Taking Protonix BID. States she deals with constipation and has little rabbit balls every day. Never feels like she has a good cleaning out. States she woke up and started talking during the last procedure. Wants to be put out this time.   Past Medical History:  Diagnosis Date  . Agoraphobia with panic disorder   . Anxiety   . Chronic pain 03/08/15   Hydrocodone-APAP 10/325 mg, # 120 q month  . Colon polyps   . Complication of anesthesia   . Depression   . DM (diabetes mellitus) (HCC)   . HTN (hypertension)   . Obesity   . PONV (postoperative nausea and vomiting)   . Stroke (HCC)     Past Surgical History:  Procedure Laterality Date  . ABDOMINAL HYSTERECTOMY  15 yrs ago   with bso at aph  . CARDIAC CATHETERIZATION  2004   normal  .  CHOLECYSTECTOMY  25 yrs ago   [facility]  . COLONOSCOPY  05/04/11   left-sided colonic diverticulosis  . COLONOSCOPY N/A 01/27/2016   diverticulosis in sigmoid colon and descending colon, active bleeding Dieulafoy lesion s/p clips X 3, next colonoscopy in 5 years due to family history of colon cancer    . ESOPHAGOGASTRODUODENOSCOPY  05/04/2011   Abnormal distal esophagus, biopsies consistent with inflammation due to acid reflux. No evidence of Barrett's. No H. pylori  . ESOPHAGOGASTRODUODENOSCOPY N/A 01/22/2016   erosive gastropathy, non-bleeding gastric ulcers and 1 oozing gastric ulcer with adherent clot s/p heater probe. Negative H.pylori serology.   . RADIOLOGY WITH ANESTHESIA N/A 08/20/2015   Procedure: RADIOLOGY WITH ANESTHESIA;  Surgeon: Sanjeev Deveshwar, MD;  Location: [facility] OR;  Service: Radiology;  Laterality: N/A;    Current Outpatient Prescriptions  Medication Sig Dispense Refill  . apixaban (ELIQUIS) 5 MG TABS tablet Take 1 tablet (5 mg total) by mouth 2 (two) times daily. Restart on 7/14 60 tablet 2  . atorvastatin (LIPITOR) 20 MG tablet Take 20 mg by mouth at bedtime.     . Cholecalciferol (VITAMIN D-3 PO) Take 1 tablet by mouth daily.    . diazepam (VALIUM) 10 MG tablet Take 1 tablet by mouth at bedtime as needed for sleep.     . enalapril (VASOTEC) 20 MG tablet Take 20 mg by mouth 2 (two) times daily.     .   glimepiride (AMARYL) 2 MG tablet Take 2 mg by mouth daily with breakfast.    . glipiZIDE (GLUCOTROL) 10 MG tablet Take 10 mg by mouth every morning. In the AM    . hydrochlorothiazide (HYDRODIURIL) 25 MG tablet Take 25 mg by mouth daily.      . HYDROcodone-acetaminophen (NORCO) 10-325 MG tablet Take 1 tablet by mouth every 6 (six) hours as needed for moderate pain. 20 tablet 0  . levothyroxine (SYNTHROID, LEVOTHROID) 25 MCG tablet Take 25 mcg by mouth daily.    . metFORMIN (GLUCOPHAGE) 500 MG tablet Take 1,000 mg by mouth 2 (two) times daily with a meal.     . metoprolol (TOPROL-XL)  50 MG 24 hr tablet Take 50 mg by mouth daily with lunch.     . pantoprazole (PROTONIX) 40 MG tablet Take 1 tablet (40 mg total) by mouth 2 (two) times daily before a meal. 30 tablet 3  . pioglitazone (ACTOS) 15 MG tablet Take 15 mg by mouth daily.     No current facility-administered medications for this visit.     Allergies as of 04/07/2016 - Review Complete 04/07/2016  Allergen Reaction Noted  . Naproxen Other (See Comments) 04/05/2011    Family History  Problem Relation Age of Onset  . Colon cancer Paternal Aunt     2, colon cancer  . Colon cancer Paternal Uncle     6, colon cancer  . Colon cancer Father     deceased, age 42  . Colon cancer      numerous cousins died before age 50 with colon cancer  . Colon cancer Sister     deceased, age 52  . Anesthesia problems Neg Hx   . Hypotension Neg Hx   . Malignant hyperthermia Neg Hx   . Pseudochol deficiency Neg Hx     Social History   Social History  . Marital status: Married    Spouse name: N/A  . Number of children: 3  . Years of education: N/A   Occupational History  . disability Not Employed   Social History Main Topics  . Smoking status: Former Smoker    Packs/day: 0.25    Years: 15.00    Types: Cigarettes    Start date: 07/17/1969    Quit date: 07/18/1971  . Smokeless tobacco: Current User    Types: Snuff     Comment: never more than 1-2 cig/day in entire life  . Alcohol use No  . Drug use: No  . Sexual activity: Yes    Birth control/ protection: Surgical   Other Topics Concern  . None   Social History Narrative   No contact with children since they left home at age 17. Doesn't know where they are.    Review of Systems: Gen: Denies fever, chills, anorexia. Denies fatigue, weakness, weight loss.  CV: Denies chest pain, palpitations, syncope, peripheral edema, and claudication. Resp: Denies dyspnea at rest, cough, wheezing, coughing up blood, and pleurisy. GI: see HPI  Derm: Denies rash, itching, dry  skin Psych: Denies depression, anxiety, memory loss, confusion. No homicidal or suicidal ideation.  Heme: see HPI   Physical Exam: BP 124/80   Pulse 78   Temp 97.6 F (36.4 C) (Oral)   Ht 5' 2" (1.575 m)   Wt 193 lb 3.2 oz (87.6 kg)   BMI 35.34 kg/m  General:   Alert and oriented. No distress noted. Pleasant and cooperative.  Head:  Normocephalic and atraumatic. Eyes:  Conjuctiva clear without scleral icterus.   Mouth:  Oral mucosa pink and moist.  Heart:  S1, S2 present without murmurs Abdomen:  +BS, soft, non-tender and non-distended. Limited exam with patient sitting up in chair. Unable to get on table due to height of table.  Extremities:  Without edema. Neurologic:  Alert and  oriented x4 Psych:  Alert and cooperative. Normal mood and affect.    

## 2016-04-27 NOTE — Anesthesia Preprocedure Evaluation (Signed)
Anesthesia Evaluation  Patient identified by MRN, date of birth, ID band Patient awake    Reviewed: Allergy & Precautions, H&P , NPO status , Patient's Chart, lab work & pertinent test results, reviewed documented beta blocker date and time   History of Anesthesia Complications (+) PONV and history of anesthetic complications  Airway Mallampati: III  TM Distance: <3 FB Neck ROM: Full    Dental  (+) Edentulous Upper, Edentulous Lower   Pulmonary neg pulmonary ROS, former smoker,    Pulmonary exam normal        Cardiovascular hypertension, Pt. on medications and Pt. on home beta blockers  Rhythm:Regular Rate:Normal     Neuro/Psych  Headaches, PSYCHIATRIC DISORDERS Anxiety Depression CVA    GI/Hepatic PUD, GERD  ,  Endo/Other  diabetes, Poorly Controlled, Type 2, Oral Hypoglycemic AgentsHypothyroidism   Renal/GU      Musculoskeletal   Abdominal   Peds  Hematology   Anesthesia Other Findings   Reproductive/Obstetrics                             Anesthesia Physical Anesthesia Plan  ASA: III  Anesthesia Plan: MAC   Post-op Pain Management:    Induction: Intravenous  Airway Management Planned: Simple Face Mask  Additional Equipment:   Intra-op Plan:   Post-operative Plan:   Informed Consent: I have reviewed the patients History and Physical, chart, labs and discussed the procedure including the risks, benefits and alternatives for the proposed anesthesia with the patient or authorized representative who has indicated his/her understanding and acceptance.     Plan Discussed with:   Anesthesia Plan Comments:         Anesthesia Quick Evaluation

## 2016-05-02 ENCOUNTER — Encounter (HOSPITAL_COMMUNITY): Payer: Self-pay | Admitting: Internal Medicine

## 2016-07-14 ENCOUNTER — Other Ambulatory Visit: Payer: Self-pay | Admitting: Internal Medicine

## 2016-09-23 ENCOUNTER — Emergency Department (HOSPITAL_COMMUNITY): Payer: Medicare HMO

## 2016-09-23 ENCOUNTER — Emergency Department (HOSPITAL_COMMUNITY)
Admission: EM | Admit: 2016-09-23 | Discharge: 2016-09-23 | Disposition: A | Discharge status: Home / Self Care | Payer: Medicare HMO | Attending: Emergency Medicine | Admitting: Emergency Medicine

## 2016-09-23 ENCOUNTER — Encounter (HOSPITAL_COMMUNITY): Payer: Self-pay | Admitting: Emergency Medicine

## 2016-09-23 DIAGNOSIS — Y929 Unspecified place or not applicable: Secondary | ICD-10-CM | POA: Diagnosis not present

## 2016-09-23 DIAGNOSIS — Y939 Activity, unspecified: Secondary | ICD-10-CM | POA: Insufficient documentation

## 2016-09-23 DIAGNOSIS — Y999 Unspecified external cause status: Secondary | ICD-10-CM | POA: Diagnosis not present

## 2016-09-23 DIAGNOSIS — I10 Essential (primary) hypertension: Secondary | ICD-10-CM | POA: Diagnosis not present

## 2016-09-23 DIAGNOSIS — Z87891 Personal history of nicotine dependence: Secondary | ICD-10-CM | POA: Diagnosis not present

## 2016-09-23 DIAGNOSIS — Z7984 Long term (current) use of oral hypoglycemic drugs: Secondary | ICD-10-CM | POA: Insufficient documentation

## 2016-09-23 DIAGNOSIS — R52 Pain, unspecified: Secondary | ICD-10-CM

## 2016-09-23 DIAGNOSIS — S0990XA Unspecified injury of head, initial encounter: Secondary | ICD-10-CM

## 2016-09-23 DIAGNOSIS — E039 Hypothyroidism, unspecified: Secondary | ICD-10-CM | POA: Insufficient documentation

## 2016-09-23 DIAGNOSIS — E119 Type 2 diabetes mellitus without complications: Secondary | ICD-10-CM | POA: Diagnosis not present

## 2016-09-23 DIAGNOSIS — S0083XA Contusion of other part of head, initial encounter: Secondary | ICD-10-CM | POA: Insufficient documentation

## 2016-09-23 DIAGNOSIS — Z79899 Other long term (current) drug therapy: Secondary | ICD-10-CM | POA: Insufficient documentation

## 2016-09-23 DIAGNOSIS — W19XXXA Unspecified fall, initial encounter: Secondary | ICD-10-CM

## 2016-09-23 DIAGNOSIS — W0110XA Fall on same level from slipping, tripping and stumbling with subsequent striking against unspecified object, initial encounter: Secondary | ICD-10-CM | POA: Insufficient documentation

## 2016-09-23 DIAGNOSIS — S80212A Abrasion, left knee, initial encounter: Secondary | ICD-10-CM | POA: Diagnosis not present

## 2016-09-23 NOTE — ED Triage Notes (Signed)
Fall at or about 1000- knee gave out and fell onto the concrete- now with L eye bruising and pt reports knocked me silly Followed by Dr Sudie BaileyKnowlton

## 2016-09-23 NOTE — ED Notes (Signed)
Pt c/o falling onto left knee and face this am. Pt on Eliquis. No neuro deficits. Left eye bruising and swelling. Abrasions to left knee. nad noted.

## 2016-09-23 NOTE — Discharge Instructions (Signed)
Please do not take your Eliquis tomorrow morning, then resume as normal.   Apply ice to help decrease the swelling  Return to the ER for new or worsening symptoms

## 2016-09-23 NOTE — ED Provider Notes (Signed)
[facility]EMER[facility]ENCY DEPT Provider Note   CSN: [id number] Arrival date & time: 09/23/16  1214  By signing my name below, I, Majel HomerPeyton Lee, attest that this documentation has been prepared under the direction and in the presence of Azalia BilisKevin Malakye Nolden, MD . Electronically Signed: Majel HomerPeyton Lee, Scribe. 09/23/2016. 1:32 PM.  History   Chief Complaint Chief Complaint  Patient presents with  . Fall  . Facial Injury   The history is provided by the patient. No language interpreter was used.   HPI Comments: Megan [name] is a 66 y.o. female with PMHx of DM, HTN, and stroke on Eliquis, who presents to the Emergency Department complaining of sudden onset, facial injury s/p a fall that occurred at ~10:00 AM this morning. Pt reports she was "stepping off a curb" this morning when her left knee suddenly "gave out," causing her to fall and strike the left side of her head on the cement. She notes associated bruising over her left eye, left knee pain, and swelling. Per husband, pt has hx of a stroke in February 2016 and states she has "balance issues" and frequent falls but refuses to use a walker to ambulate. Pt denies any neck pain or loss of consciousness.   Past Medical History:  Diagnosis Date  . Agoraphobia with panic disorder   . Anxiety   . Arthritis   . Chronic pain 03/08/15   Hydrocodone-APAP 10/325 mg, # 120 q month  . Colon polyps   . Complication of anesthesia   . Depression   . DM (diabetes mellitus) (HCC)   . GERD (gastroesophageal reflux disease)   . HTN (hypertension)   . Hypothyroidism   . Obesity   . PONV (postoperative nausea and vomiting)   . Stroke (HCC) 08/20/2014   left sided weakness; some memory loss.    Patient Active Problem List   Diagnosis Date Noted  . PUD (peptic ulcer disease) 04/07/2016  . Constipation 04/07/2016  . Diverticulosis of colon without hemorrhage   . Rectal bleeding   . BRBPR (bright red blood per rectum) 01/25/2016  . Hypoglycemia 01/20/2016  .  Upper GI bleeding 01/20/2016  . H/O: CVA (cerebrovascular accident) 01/20/2016  . Chronic pain 01/20/2016  . DM type 2 (diabetes mellitus, type 2) (HCC) 01/20/2016  . Benign essential HTN 01/20/2016  . Anxiety and depression 01/20/2016  . Blood loss anemia 01/20/2016  . Knee pain, acute   . Cephalalgia   . GERD (gastroesophageal reflux disease) 06/14/2011  . FH: colon cancer 04/05/2011  . History of colon polyps 04/05/2011  . Left sided abdominal pain 04/05/2011  . N&V (nausea and vomiting) 04/05/2011   Past Surgical History:  Procedure Laterality Date  . ABDOMINAL HYSTERECTOMY  15 yrs ago   with bso at aph  . CARDIAC CATHETERIZATION  2004   normal  . CHOLECYSTECTOMY  25 yrs ago   [facility]  . COLONOSCOPY  05/04/11   left-sided colonic diverticulosis  . COLONOSCOPY N/A 01/27/2016   diverticulosis in sigmoid colon and descending colon, active bleeding Dieulafoy lesion s/p clips X 3, next colonoscopy in 5 years due to family history of colon cancer    . ESOPHAGOGASTRODUODENOSCOPY  05/04/2011   Abnormal distal esophagus, biopsies consistent with inflammation due to acid reflux. No evidence of Barrett's. No H. pylori  . ESOPHAGOGASTRODUODENOSCOPY N/A 01/22/2016   erosive gastropathy, non-bleeding gastric ulcers and 1 oozing gastric ulcer with adherent clot s/p heater probe. Negative H.pylori serology.   . ESOPHAGOGASTRODUODENOSCOPY (EGD) WITH PROPOFOL N/A 04/27/2016  Procedure: ESOPHAGOGASTRODUODENOSCOPY (EGD) WITH PROPOFOL;  Surgeon: Corbin Ade, MD;  Location: [facility];  Service: Endoscopy;  Laterality: N/A;  7:30 AM  . RADIOLOGY WITH ANESTHESIA N/A 08/20/2015   Procedure: RADIOLOGY WITH ANESTHESIA;  Surgeon: Julieanne Cotton, MD;  Location: [facility] OR;  Service: Radiology;  Laterality: N/A;   OB History    No data available     Home Medications    Prior to Admission medications   Medication Sig Start Date End Date Taking? Authorizing Provider  apixaban (ELIQUIS) 5 MG TABS tablet  Take 1 tablet (5 mg total) by mouth 2 (two) times daily. Restart on 7/14 01/27/16   Erick Blinks, MD  atorvastatin (LIPITOR) 20 MG tablet Take 20 mg by mouth at bedtime.     Historical Provider, MD  Cholecalciferol (VITAMIN D-3 PO) Take 1 tablet by mouth daily.    Historical Provider, MD  diazepam (VALIUM) 10 MG tablet Take 1 tablet by mouth at bedtime as needed for sleep.  12/10/15   Historical Provider, MD  enalapril (VASOTEC) 20 MG tablet Take 20 mg by mouth 2 (two) times daily.     Historical Provider, MD  glimepiride (AMARYL) 2 MG tablet Take 2 mg by mouth daily with breakfast.    Historical Provider, MD  glipiZIDE (GLUCOTROL) 10 MG tablet Take 10 mg by mouth every morning. In the AM    Historical Provider, MD  hydrochlorothiazide (HYDRODIURIL) 25 MG tablet Take 25 mg by mouth daily.      Historical Provider, MD  HYDROcodone-acetaminophen (NORCO) 10-325 MG tablet Take 1 tablet by mouth every 6 (six) hours as needed for moderate pain. 12/24/15   Elson Areas, PA-C  levothyroxine (SYNTHROID, LEVOTHROID) 25 MCG tablet Take 25 mcg by mouth daily. 07/14/15   Historical Provider, MD  MELATONIN PO Take 1 tablet by mouth daily as needed (sleep).    Historical Provider, MD  metFORMIN (GLUCOPHAGE) 500 MG tablet Take 1,000 mg by mouth 2 (two) times daily with a meal.     Historical Provider, MD  metoprolol (TOPROL-XL) 50 MG 24 hr tablet Take 50 mg by mouth daily with lunch.     Historical Provider, MD  pantoprazole (PROTONIX) 40 MG tablet TAKE (1) TABLET BY MOUTH TWICE DAILY BEFORE A MEAL. 07/14/16   Gelene Mink, NP  pioglitazone (ACTOS) 15 MG tablet Take 15 mg by mouth daily.    Historical Provider, MD   Family History Family History  Problem Relation Age of Onset  . Colon cancer Paternal Aunt     2, colon cancer  . Colon cancer Paternal Uncle     6, colon cancer  . Colon cancer Father     deceased, age 37  . Colon cancer      numerous cousins died before age 69 with colon cancer  . Colon  cancer Sister     deceased, age 74  . Anesthesia problems Neg Hx   . Hypotension Neg Hx   . Malignant hyperthermia Neg Hx   . Pseudochol deficiency Neg Hx    Social History Social History  Substance Use Topics  . Smoking status: Former Smoker    Packs/day: 0.25    Years: 15.00    Types: Cigarettes    Start date: 07/17/1969    Quit date: 07/18/1971  . Smokeless tobacco: Former Neurosurgeon    Types: Snuff    Quit date: 08/20/2014     Comment: never more than 1-2 cig/day in entire life  . Alcohol use No  Allergies   Naproxen  Review of Systems Review of Systems  Musculoskeletal: Positive for arthralgias and joint swelling. Negative for neck pain.  Skin: Positive for color change.  Neurological: Negative for syncope.   Physical Exam Updated Vital Signs BP 136/73 (BP Location: Left Arm)   Pulse 79   Temp 98.3 F (36.8 C) (Oral)   Resp 18   Ht 5\' 2"  (1.575 m)   Wt 200 lb (90.7 kg)   SpO2 100%   BMI 36.58 kg/m   Physical Exam  Constitutional: She is oriented to person, place, and time. She appears well-developed and well-nourished. No distress.  HENT:  Head: Normocephalic and atraumatic.  Obvious hematoma f the left periorbital region.  Eyes: EOM are normal.  Neck: Normal range of motion.  Cardiovascular: Normal rate, regular rhythm and normal heart sounds.   Pulmonary/Chest: Effort normal and breath sounds normal.  Abdominal: Soft. She exhibits no distension. There is no tenderness.  Musculoskeletal: Normal range of motion.  Abrasion of the left knee with full ROM.   Neurological: She is alert and oriented to person, place, and time.  Skin: Skin is warm and dry.  Psychiatric: She has a normal mood and affect. Judgment normal.  Nursing note and vitals reviewed.  ED Treatments / Results  DIAGNOSTIC STUDIES:  Oxygen Saturation is 100% on RA, normal by my interpretation.    COORDINATION OF CARE:  1:28 PM Discussed treatment plan with pt at bedside and pt agreed to  plan.  Labs (all labs ordered are listed, but only abnormal results are displayed) Labs Reviewed - No data to display  EKG  EKG Interpretation None       Radiology No results found.  Procedures Procedures (including critical care time)  Medications Ordered in ED Medications - No data to display  Initial Impression / Assessment and Plan / ED Course  I have reviewed the triage vital signs and the nursing notes.  Pertinent labs & imaging results that were available during my care of the patient were reviewed by me and considered in my medical decision making (see chart for details).     Mechanical fall.  No traumatic osseous injury evident on CT imaging or plain films.  Patient with large periorbital hematoma of the left.  Recommended ice.  We'll hold anticoagulation for 24 hours.  Patient understands return to the ER for new or worsening symptoms.  No weakness of her arms or legs.  Chest and abdomen are benign.  I personally performed the services described in this documentation, which was scribed in my presence. The recorded information has been reviewed and is accurate.      Final Clinical Impressions(s) / ED Diagnoses   Final diagnoses:  Pain  Fall  Fall, initial encounter  Facial contusion, initial encounter  Closed head injury, initial encounter    New Prescriptions New Prescriptions   No medications on file     Azalia Bilis, MD 09/23/16 [phone]

## 2016-09-30 ENCOUNTER — Emergency Department (HOSPITAL_COMMUNITY)
Admission: EM | Admit: 2016-09-30 | Discharge: 2016-09-30 | Disposition: A | Discharge status: Home / Self Care | Payer: Medicare HMO | Attending: Dermatology | Admitting: Dermatology

## 2016-09-30 ENCOUNTER — Encounter (HOSPITAL_COMMUNITY): Payer: Self-pay

## 2016-09-30 DIAGNOSIS — Z87891 Personal history of nicotine dependence: Secondary | ICD-10-CM | POA: Insufficient documentation

## 2016-09-30 DIAGNOSIS — Z5321 Procedure and treatment not carried out due to patient leaving prior to being seen by health care provider: Secondary | ICD-10-CM | POA: Insufficient documentation

## 2016-09-30 DIAGNOSIS — E039 Hypothyroidism, unspecified: Secondary | ICD-10-CM | POA: Insufficient documentation

## 2016-09-30 DIAGNOSIS — E119 Type 2 diabetes mellitus without complications: Secondary | ICD-10-CM | POA: Diagnosis not present

## 2016-09-30 DIAGNOSIS — I10 Essential (primary) hypertension: Secondary | ICD-10-CM | POA: Insufficient documentation

## 2016-09-30 DIAGNOSIS — Z7984 Long term (current) use of oral hypoglycemic drugs: Secondary | ICD-10-CM | POA: Insufficient documentation

## 2016-09-30 DIAGNOSIS — Z79899 Other long term (current) drug therapy: Secondary | ICD-10-CM | POA: Diagnosis not present

## 2016-09-30 DIAGNOSIS — R220 Localized swelling, mass and lump, head: Secondary | ICD-10-CM | POA: Diagnosis not present

## 2016-09-30 NOTE — ED Triage Notes (Signed)
Patient was seen for a fall last Saturday night and is here tonight because of swelling to her right eyebrow.  Worried that it may be a blood clot.  States that she had Ct scans done and nothing was broken.

## 2016-09-30 NOTE — ED Notes (Signed)
[facility] to desk and stated [facility] Patient left with steady gait with visitor.

## 2016-10-26 ENCOUNTER — Ambulatory Visit (INDEPENDENT_AMBULATORY_CARE_PROVIDER_SITE_OTHER): Payer: Medicare HMO | Admitting: Gastroenterology

## 2016-10-26 ENCOUNTER — Encounter: Payer: Self-pay | Admitting: Gastroenterology

## 2016-10-26 VITALS — BP 159/79 | HR 56 | Temp 98.0°F | Ht 62.0 in | Wt 195.8 lb

## 2016-10-26 DIAGNOSIS — K279 Peptic ulcer, site unspecified, unspecified as acute or chronic, without hemorrhage or perforation: Secondary | ICD-10-CM

## 2016-10-26 MED ORDER — PANTOPRAZOLE SODIUM 40 MG PO TBEC: DELAYED_RELEASE_TABLET | ORAL | 3 refills | Status: DC

## 2016-10-26 NOTE — Progress Notes (Signed)
CC'D TO PCP °

## 2016-10-26 NOTE — Patient Instructions (Signed)
Continue Protonix twice a day.   We will see you in 6 months.  Avoid these medications: Advil, Aleve, Ibuprofen, Motrin, etc. Just take tylenol products as directed on the bottle.

## 2016-10-26 NOTE — Progress Notes (Signed)
Referring Provider: [name], MD Primary Care Physician:  Milana Obey, MD Primary GI: Dr. Jena Gauss   Chief Complaint  Patient presents with  . PUD    pp f/u, doing ok    HPI:   Megan [name] is a 66 y.o. female presenting today with a history of GI bleed in setting of Eliquis, with findings of erosive gastropathy, non-bleeding gastric ulcers and 1 oozing gastric ulcer with adherent clot on endoscopy. Brief hospitalization thereafter with colonoscopy revealing bleeding Dieulafoy's lesion s/p clips X 3, with next colonoscopy in 5 years due to family history of colon cancer. Recent EGD for surveillance with healed gastric ulcer, normal esophagus. Here for follow-up.   Doesn't have much energy, which she states is chronic. Falls a lot and now has a black eye. Was seen in the ED. Her left knee gives out at times, which causes her to fall. Had stroke with left-sided deficits. Doesn't want the life alert button. Protonix BID. Takes Advil sometimes.    Past Medical History:  Diagnosis Date  . Agoraphobia with panic disorder   . Anxiety   . Arthritis   . Chronic pain 03/08/15   Hydrocodone-APAP 10/325 mg, # 120 q month  . Colon polyps   . Complication of anesthesia   . Depression   . DM (diabetes mellitus) (HCC)   . GERD (gastroesophageal reflux disease)   . HTN (hypertension)   . Hypothyroidism   . Obesity   . PONV (postoperative nausea and vomiting)   . Stroke (HCC) 08/20/2014   left sided weakness; some memory loss.    Past Surgical History:  Procedure Laterality Date  . ABDOMINAL HYSTERECTOMY  15 yrs ago   with bso at aph  . CARDIAC CATHETERIZATION  2004   normal  . CHOLECYSTECTOMY  25 yrs ago   [facility]  . COLONOSCOPY  05/04/11   left-sided colonic diverticulosis  . COLONOSCOPY N/A 01/27/2016   diverticulosis in sigmoid colon and descending colon, active bleeding Dieulafoy lesion s/p clips X 3, next colonoscopy in 5 years due to family history of colon  cancer    . ESOPHAGOGASTRODUODENOSCOPY  05/04/2011   Abnormal distal esophagus, biopsies consistent with inflammation due to acid reflux. No evidence of Barrett's. No H. pylori  . ESOPHAGOGASTRODUODENOSCOPY N/A 01/22/2016   erosive gastropathy, non-bleeding gastric ulcers and 1 oozing gastric ulcer with adherent clot s/p heater probe. Negative H.pylori serology.   . ESOPHAGOGASTRODUODENOSCOPY (EGD) WITH PROPOFOL N/A 04/27/2016   Dr. Jena Gauss: healed ulcers. normal esophagus  . RADIOLOGY WITH ANESTHESIA N/A 08/20/2015   Procedure: RADIOLOGY WITH ANESTHESIA;  Surgeon: Julieanne Cotton, MD;  Location: [facility] OR;  Service: Radiology;  Laterality: N/A;    Current Outpatient Prescriptions  Medication Sig Dispense Refill  . apixaban (ELIQUIS) 5 MG TABS tablet Take 1 tablet (5 mg total) by mouth 2 (two) times daily. Restart on 7/14 60 tablet 2  . atorvastatin (LIPITOR) 20 MG tablet Take 20 mg by mouth at bedtime.     . Cholecalciferol (VITAMIN D-3 PO) Take 1 tablet by mouth daily.    . diazepam (VALIUM) 10 MG tablet Take 1 tablet by mouth at bedtime as needed for sleep.     . enalapril (VASOTEC) 20 MG tablet Take 20 mg by mouth 2 (two) times daily.     Marland Kitchen glimepiride (AMARYL) 2 MG tablet Take 2 mg by mouth daily with breakfast.    . glipiZIDE (GLUCOTROL) 10 MG tablet Take 10 mg by mouth every morning. In  the AM    . hydrochlorothiazide (HYDRODIURIL) 25 MG tablet Take 25 mg by mouth daily.      Marland Kitchen HYDROcodone-acetaminophen (NORCO) 10-325 MG tablet Take 1 tablet by mouth every 6 (six) hours as needed for moderate pain. 20 tablet 0  . levothyroxine (SYNTHROID, LEVOTHROID) 25 MCG tablet Take 25 mcg by mouth daily.    Marland Kitchen MELATONIN PO Take 1 tablet by mouth daily as needed (sleep).    . metFORMIN (GLUCOPHAGE) 500 MG tablet Take 1,000 mg by mouth 2 (two) times daily with a meal.     . metoprolol (TOPROL-XL) 50 MG 24 hr tablet Take 50 mg by mouth daily with lunch.     . pantoprazole (PROTONIX) 40 MG tablet TAKE (1)  TABLET BY MOUTH TWICE DAILY BEFORE A MEAL. 60 tablet 3  . pioglitazone (ACTOS) 15 MG tablet Take 15 mg by mouth daily.     No current facility-administered medications for this visit.     Allergies as of 10/26/2016 - Review Complete 10/26/2016  Allergen Reaction Noted  . Naproxen Other (See Comments) 04/05/2011    Family History  Problem Relation Age of Onset  . Colon cancer Paternal Aunt     2, colon cancer  . Colon cancer Paternal Uncle     6, colon cancer  . Colon cancer Father     deceased, age 80  . Colon cancer      numerous cousins died before age 1 with colon cancer  . Colon cancer Sister     deceased, age 40  . Anesthesia problems Neg Hx   . Hypotension Neg Hx   . Malignant hyperthermia Neg Hx   . Pseudochol deficiency Neg Hx     Social History   Social History  . Marital status: Married    Spouse name: N/A  . Number of children: 3  . Years of education: N/A   Occupational History  . disability Not Employed   Social History Main Topics  . Smoking status: Former Smoker    Packs/day: 0.25    Years: 15.00    Types: Cigarettes    Start date: 07/17/1969    Quit date: 07/18/1971  . Smokeless tobacco: Former Neurosurgeon    Types: Snuff    Quit date: 08/20/2014     Comment: never more than 1-2 cig/day in entire life  . Alcohol use No  . Drug use: No  . Sexual activity: Yes    Birth control/ protection: Surgical   Other Topics Concern  . None   Social History Narrative   No contact with children since they left home at age 35. Doesn't know where they are.    Review of Systems: As mentioned in HPI   Physical Exam: BP (!) 159/79   Pulse (!) 56   Temp 98 F (36.7 C) (Oral)   Ht  (1.575 m)   Wt 195 lb 12.8 oz (88.8 kg)   BMI 35.81 kg/m  General:   Alert and oriented. No distress noted. Pleasant and cooperative.  Head:  Normocephalic and atraumatic. Eyes:  Conjuctiva clear without scleral icterus. Resolving bruise to left cheekbone Mouth:  Oral mucosa  pink and moist.  Abdomen:  +BS, soft, non-tender and non-distended. No rebound or guarding. No HSM or masses noted. Extremities:  Left ankle trace edema Neurologic:  Alert and  oriented x4 Psych:  Alert and cooperative. Normal mood and affect.

## 2016-10-26 NOTE — Assessment & Plan Note (Addendum)
66 year old female with PUD, with recent EGD for surveillance with healed area and normal esophagus. Remains on PPI BID. She endorses occasional NSAID use, which I have asked her to abstain from indefinitely, only using tylenol products. She stated understanding. We will see her back in 6 months.  As of note, she reports several falls and notes LLE weakness secondary to stroke. She has had evaluation of her home for safety, and I offered a PT evaluation at home but she declined. She also declined assistive devices such as life alert.

## 2017-04-25 ENCOUNTER — Ambulatory Visit (HOSPITAL_COMMUNITY)
Admission: RE | Admit: 2017-04-25 | Discharge: 2017-04-25 | Disposition: A | Discharge status: Home / Self Care | Payer: Medicare HMO | Source: Ambulatory Visit | Attending: Family Medicine | Admitting: Family Medicine

## 2017-04-25 ENCOUNTER — Other Ambulatory Visit (HOSPITAL_COMMUNITY): Payer: Self-pay | Admitting: Family Medicine

## 2017-04-25 DIAGNOSIS — I700 Atherosclerosis of aorta: Secondary | ICD-10-CM | POA: Diagnosis not present

## 2017-04-25 DIAGNOSIS — R29898 Other symptoms and signs involving the musculoskeletal system: Secondary | ICD-10-CM | POA: Diagnosis present

## 2017-04-25 DIAGNOSIS — M5136 Other intervertebral disc degeneration, lumbar region: Secondary | ICD-10-CM | POA: Diagnosis not present

## 2017-04-30 ENCOUNTER — Ambulatory Visit: Payer: Medicare HMO | Admitting: Gastroenterology

## 2017-06-21 ENCOUNTER — Encounter: Payer: Self-pay | Admitting: Gastroenterology

## 2017-06-21 ENCOUNTER — Ambulatory Visit (INDEPENDENT_AMBULATORY_CARE_PROVIDER_SITE_OTHER): Payer: Medicare HMO | Admitting: Gastroenterology

## 2017-06-21 VITALS — BP 153/91 | HR 73 | Temp 97.1°F | Ht 62.0 in | Wt 206.8 lb

## 2017-06-21 DIAGNOSIS — K279 Peptic ulcer, site unspecified, unspecified as acute or chronic, without hemorrhage or perforation: Secondary | ICD-10-CM | POA: Diagnosis not present

## 2017-06-21 NOTE — Progress Notes (Signed)
Referring Provider: Gareth MorganKnowlton, Steve, MD Primary Care Physician:  Gareth MorganKnowlton, Steve, MD Primary GI: Dr. Jena Gaussourk   Chief Complaint  Patient presents with  . Peptic Ulcer Disease    HPI:   Megan [name] is a 66 y.o. female presenting today with a history of GI bleed in setting of Eliquis, with findings oferosive gastropathy, non-bleeding gastric ulcers and 1 oozing gastric ulcer with adherent clot on endoscopy. Brief hospitalization thereafter with colonoscopy revealing bleeding Dieulafoy's lesion s/p clips X 3, with next colonoscopy in 5 years due to family history of colon cancer. Recent EGD (Oct 2017) for surveillance with healed gastric ulcer, normal esophagus  Still falling a lot. Going to be referred to a neurologist, sometime in January. States her left leg turns on her. Lots of gas. Eats broccoli, pork and beans. Loves pork and beans. Gets choked if eating too much at one time. No overt GI bleeding. No NSAIDs. Husband present with her today. No constipation or diarrhea.  Past Medical History:  Diagnosis Date  . Agoraphobia with panic disorder   . Anxiety   . Arthritis   . Chronic pain 03/08/15   Hydrocodone-APAP 10/325 mg, # 120 q month  . Colon polyps   . Complication of anesthesia   . Depression   . DM (diabetes mellitus) (HCC)   . GERD (gastroesophageal reflux disease)   . HTN (hypertension)   . Hypothyroidism   . Obesity   . PONV (postoperative nausea and vomiting)   . Stroke (HCC) 08/20/2014   left sided weakness; some memory loss.    Past Surgical History:  Procedure Laterality Date  . ABDOMINAL HYSTERECTOMY  15 yrs ago   with bso at aph  . CARDIAC CATHETERIZATION  2004   normal  . CHOLECYSTECTOMY  25 yrs ago   [facility]  . COLONOSCOPY  05/04/11   left-sided colonic diverticulosis  . COLONOSCOPY N/A 01/27/2016   diverticulosis in sigmoid colon and descending colon, active bleeding Dieulafoy lesion s/p clips X 3, next colonoscopy in 5 years due to family  history of colon cancer    . ESOPHAGOGASTRODUODENOSCOPY  05/04/2011   Abnormal distal esophagus, biopsies consistent with inflammation due to acid reflux. No evidence of Barrett's. No H. pylori  . ESOPHAGOGASTRODUODENOSCOPY N/A 01/22/2016   erosive gastropathy, non-bleeding gastric ulcers and 1 oozing gastric ulcer with adherent clot s/p heater probe. Negative H.pylori serology.   . ESOPHAGOGASTRODUODENOSCOPY (EGD) WITH PROPOFOL N/A 04/27/2016   Dr. Jena Gaussourk: healed ulcers. normal esophagus  . RADIOLOGY WITH ANESTHESIA N/A 08/20/2015   Procedure: RADIOLOGY WITH ANESTHESIA;  Surgeon: Julieanne CottonSanjeev Deveshwar, MD;  Location: [facility] OR;  Service: Radiology;  Laterality: N/A;    Current Outpatient Medications  Medication Sig Dispense Refill  . apixaban (ELIQUIS) 5 MG TABS tablet Take 1 tablet (5 mg total) by mouth 2 (two) times daily. Restart on 7/14 60 tablet 2  . atorvastatin (LIPITOR) 20 MG tablet Take 20 mg by mouth at bedtime.     . Cholecalciferol (VITAMIN D-3 PO) Take 1 tablet by mouth daily.    . diazepam (VALIUM) 10 MG tablet Take 1 tablet by mouth at bedtime as needed for sleep.     . enalapril (VASOTEC) 20 MG tablet Take 20 mg by mouth 2 (two) times daily.     . enalapril (VASOTEC) 20 MG tablet Take 20 mg by mouth 2 (two) times daily.    Marland Kitchen. escitalopram (LEXAPRO) 10 MG tablet Take 1 tablet by mouth daily.    . furosemide (LASIX)  40 MG tablet Take 1 tablet by mouth as needed.    Marland Kitchen. glipiZIDE (GLUCOTROL) 10 MG tablet Take 10 mg by mouth every morning. In the AM    . hydrochlorothiazide (HYDRODIURIL) 25 MG tablet Take 25 mg by mouth daily.      Marland Kitchen. HYDROcodone-acetaminophen (NORCO) 10-325 MG tablet Take 1 tablet by mouth every 6 (six) hours as needed for moderate pain. 20 tablet 0  . levothyroxine (SYNTHROID, LEVOTHROID) 25 MCG tablet Take 25 mcg by mouth daily.    Marland Kitchen. linaclotide (LINZESS) 72 MCG capsule Take 72 mcg by mouth as needed.    . metFORMIN (GLUCOPHAGE) 500 MG tablet Take 1,000 mg by mouth 2 (two)  times daily with a meal.     . metoprolol (TOPROL-XL) 50 MG 24 hr tablet Take 50 mg by mouth daily with lunch.     . pantoprazole (PROTONIX) 40 MG tablet Take 40 mg by mouth daily.    . pioglitazone (ACTOS) 15 MG tablet Take 15 mg by mouth daily.    . predniSONE (DELTASONE) 5 MG tablet Take 1 tablet by mouth daily.    . temazepam (RESTORIL) 30 MG capsule Take 1 capsule by mouth at bedtime as needed.    . traZODone (DESYREL) 50 MG tablet Take 1 tablet by mouth at bedtime.    Marland Kitchen. glimepiride (AMARYL) 2 MG tablet Take 2 mg by mouth daily with breakfast.    . MELATONIN PO Take 1 tablet by mouth daily as needed (sleep).     No current facility-administered medications for this visit.     Allergies as of 06/21/2017 - Review Complete 06/21/2017  Allergen Reaction Noted  . Naproxen Other (See Comments) 04/05/2011    Family History  Problem Relation Age of Onset  . Colon cancer Paternal Aunt        2, colon cancer  . Colon cancer Paternal Uncle        6, colon cancer  . Colon cancer Father        deceased, age 67  . Colon cancer Unknown        numerous cousins died before age 350 with colon cancer  . Colon cancer Sister        deceased, age 67  . Anesthesia problems Neg Hx   . Hypotension Neg Hx   . Malignant hyperthermia Neg Hx   . Pseudochol deficiency Neg Hx     Social History   Socioeconomic History  . Marital status: Married    Spouse name: None  . Number of children: 3  . Years of education: None  . Highest education level: None  Social Needs  . Financial resource strain: None  . Food insecurity - worry: None  . Food insecurity - inability: None  . Transportation needs - medical: None  . Transportation needs - non-medical: None  Occupational History  . Occupation: disability    Employer: NOT EMPLOYED  Tobacco Use  . Smoking status: Former Smoker    Packs/day: 0.25    Years: 15.00    Pack years: 3.75    Types: Cigarettes    Start date: 07/17/1969    Last attempt to  quit: 07/18/1971    Years since quitting: 45.9  . Smokeless tobacco: Former NeurosurgeonUser    Types: Snuff    Quit date: 08/20/2014  . Tobacco comment: never more than 1-2 cig/day in entire life  Substance and Sexual Activity  . Alcohol use: No    Alcohol/week: 0.0 oz  . Drug use:  No  . Sexual activity: Yes    Birth control/protection: Surgical  Other Topics Concern  . None  Social History Narrative   No contact with children since they left home at age 70. Doesn't know where they are.    Review of Systems: Gen: see HPI  CV: Denies chest pain, palpitations, syncope, peripheral edema, and claudication. Resp: Denies dyspnea at rest, cough, wheezing, coughing up blood, and pleurisy. GI: see HPI  Derm: Denies rash, itching, dry skin Psych: Denies depression, anxiety, memory loss, confusion. No homicidal or suicidal ideation.  Heme: Denies bruising, bleeding, and enlarged lymph nodes.  Physical Exam: BP (!) 153/91   Pulse 73   Temp (!) 97.1 F (36.2 C) (Oral)   Ht 5\' 2"  (1.575 m)   Wt 206 lb 12.8 oz (93.8 kg)   BMI 37.82 kg/m  General:   Alert and oriented. No distress noted. Pleasant and cooperative.  Head:  Normocephalic and atraumatic. Eyes:  Conjuctiva clear without scleral icterus. Mouth:  Oral mucosa pink and moist.  Abdomen:  +BS, soft, non-tender and non-distended. Unable to complete full exam as she is sitting in chair and not able to get on exam table Msk:  Symmetrical without gross deformities. Normal posture. Extremities:  Without edema. Neurologic:  Alert and  oriented x4 Psych:  Alert and cooperative. Normal mood and affect.

## 2017-06-21 NOTE — Assessment & Plan Note (Signed)
EGD last year with prior site of ulcer healed. Continues on PPI therapy and avoiding NSAIDs. Will see her back in 6 months. Next colonoscopy in 2022.

## 2017-06-21 NOTE — Patient Instructions (Signed)
Continue to avoid any Ibuprofen, Advil, Aleve, Motrin, BC powders, Goody powders. Continue Protonix once daily.  Have a wonderful Christmas!  I will see you in 6 months!

## 2017-06-22 NOTE — Progress Notes (Signed)
cc'ed to pcp °

## 2017-07-16 ENCOUNTER — Ambulatory Visit (HOSPITAL_COMMUNITY)
Admission: RE | Admit: 2017-07-16 | Discharge: 2017-07-16 | Disposition: A | Discharge status: Home / Self Care | Payer: Medicare HMO | Source: Ambulatory Visit | Attending: Family Medicine | Admitting: Family Medicine

## 2017-07-16 ENCOUNTER — Other Ambulatory Visit (HOSPITAL_COMMUNITY): Payer: Self-pay | Admitting: Family Medicine

## 2017-07-16 DIAGNOSIS — M47812 Spondylosis without myelopathy or radiculopathy, cervical region: Secondary | ICD-10-CM | POA: Diagnosis not present

## 2017-07-16 DIAGNOSIS — R531 Weakness: Secondary | ICD-10-CM | POA: Insufficient documentation

## 2017-08-20 ENCOUNTER — Other Ambulatory Visit: Payer: Self-pay

## 2017-08-20 ENCOUNTER — Encounter (HOSPITAL_COMMUNITY): Payer: Self-pay

## 2017-08-20 ENCOUNTER — Ambulatory Visit (HOSPITAL_COMMUNITY): Payer: Medicare HMO | Attending: Neurology

## 2017-08-20 DIAGNOSIS — R296 Repeated falls: Secondary | ICD-10-CM | POA: Insufficient documentation

## 2017-08-20 DIAGNOSIS — Z9181 History of falling: Secondary | ICD-10-CM | POA: Insufficient documentation

## 2017-08-20 DIAGNOSIS — M6281 Muscle weakness (generalized): Secondary | ICD-10-CM | POA: Diagnosis present

## 2017-08-20 DIAGNOSIS — R2681 Unsteadiness on feet: Secondary | ICD-10-CM | POA: Insufficient documentation

## 2017-08-20 NOTE — Therapy (Addendum)
[facility] [address] [place], [place], [address] Phone: [phone]   Fax:  [phone]  Physical Therapy Evaluation  Patient Details  Name: Megan LeekVirginia A [name] MRN: [id number] Date of Birth: 11/19/1950 Referring Provider: Beryle BeamsKofi Doonquah, MD   Encounter Date: 08/20/2017  PT End of Session - 08/20/17 1440    Visit Number  1    Number of Visits  13    Date for PT Re-Evaluation  09/10/17    Authorization Type  [employer] HMO    Authorization Time Period  08/20/17 to 10/01/17    PT Start Time  1300    PT Stop Time  1345    PT Time Calculation (min)  45 min    Equipment Utilized During Treatment  Gait belt    Activity Tolerance  Patient tolerated treatment well    Behavior During Therapy  [facility]/per[facility]       Past Medical History:  Diagnosis Date  . Agoraphobia with panic disorder   . Anxiety   . Arthritis   . Chronic pain 03/08/15   Hydrocodone-APAP 10/325 mg, # 120 q month  . Colon polyps   . Complication of anesthesia   . Depression   . DM (diabetes mellitus) (HCC)   . GERD (gastroesophageal reflux disease)   . HTN (hypertension)   . Hypothyroidism   . Obesity   . PONV (postoperative nausea and vomiting)   . Stroke (HCC) 08/20/2014   left sided weakness; some memory loss.    Past Surgical History:  Procedure Laterality Date  . ABDOMINAL HYSTERECTOMY  15 yrs ago   with bso at aph  . CARDIAC CATHETERIZATION  2004   normal  . CHOLECYSTECTOMY  25 yrs ago   [facility]  . COLONOSCOPY  05/04/11   left-sided colonic diverticulosis  . COLONOSCOPY N/A 01/27/2016   diverticulosis in sigmoid colon and descending colon, active bleeding Dieulafoy lesion s/p clips X 3, next colonoscopy in 5 years due to family history of colon cancer    . ESOPHAGOGASTRODUODENOSCOPY  05/04/2011   Abnormal distal esophagus, biopsies consistent with inflammation due to acid reflux. No evidence of Barrett's. No H. pylori  .  ESOPHAGOGASTRODUODENOSCOPY N/A 01/22/2016   erosive gastropathy, non-bleeding gastric ulcers and 1 oozing gastric ulcer with adherent clot s/p heater probe. Negative H.pylori serology.   . ESOPHAGOGASTRODUODENOSCOPY (EGD) WITH PROPOFOL N/A 04/27/2016   Dr. Jena Gaussourk: healed ulcers. normal esophagus  . RADIOLOGY WITH ANESTHESIA N/A 08/20/2015   Procedure: RADIOLOGY WITH ANESTHESIA;  Surgeon: Julieanne CottonSanjeev Deveshwar, MD;  Location: [facility] OR;  Service: Radiology;  Laterality: N/A;    There were no vitals filed for this visit.   Subjective Assessment - 08/20/17 1309    Subjective  Pt states that her feet used to be so swollen that she couldn't get her shoes on. She states that she would fall a lot due to the swelling and pain. Her last fall was in January 2019 when her L knee went out from under her.  She does report some BLE weakness, but states she feels pretty strong this week. Pt states that she has the most difficulty getting around; she has to do anything slow. She also has increased difficulty when outside of the house. She has the most difficulty with her LLE. She does have a SPC but she only uses it on carpet.    Pertinent History  CVA in 2016 with residual L sided weakness    Limitations  Walking;House hold activities  How long can you sit comfortably?  no issues    How long can you stand comfortably?  3-4 mins then legs get tired    How long can you walk comfortably?  About 1 min and then her legs give out    Patient Stated Goals  improvement of my legs and getting around    Currently in Pain?  No/denies         [facility] PT Assessment - 08/20/17 0001      Assessment   Medical Diagnosis  gait training    Referring Provider  Beryle Beams, MD    Onset Date/Surgical Date  08/20/16    Next MD Visit  March 2019    Prior Therapy  OPPT for previous CVA for balance      Precautions   Precautions  Fall      Balance Screen   Has the patient fallen in the past 6 months  Yes    How many times?  "50x.  Probably more"    Has the patient had a decrease in activity level because of a fear of falling?   No    Is the patient reluctant to leave their home because of a fear of falling?   Yes      Prior Function   Level of Independence  Independent    Vocation  Retired    Leisure  TV, reading, working with flowers      Observation/Other Assessments   Focus on Therapeutic Outcomes (FOTO)   49% limitation      Sensation   Light Touch  Appears Intact      Posture/Postural Control   Posture/Postural Control  Postural limitations    Postural Limitations  Rounded Shoulders;Forward head;Increased thoracic kyphosis      ROM / Strength   AROM / PROM / Strength  Strength      Strength   Strength Assessment Site  Hip;Knee;Ankle    Right Hip Flexion  4+/5    Right Hip Extension  3/5    Right Hip ABduction  3-/5    Left Hip Flexion  4/5    Left Hip Extension  2+/5    Left Hip ABduction  2+/5    Right Knee Flexion  4+/5    Right Knee Extension  5/5    Left Knee Flexion  4+/5    Left Knee Extension  4+/5    Right Ankle Dorsiflexion  4+/5    Left Ankle Dorsiflexion  4+/5      Bed Mobility   Bed Mobility  Sit to Supine;Supine to Sit    Supine to Sit  4: Min assist    Sit to Supine  4: Min assist      Ambulation/Gait   Ambulation Distance (Feet)  100 Feet around gym during session/during DGI    Assistive device  None    Gait Pattern  Step-to pattern;Step-through pattern;Decreased stride length;Wide base of support;Right steppage;Left steppage    Gait Comments  intermittent steppage/step-to gait pattern, more prominent when pt performing head turns and/or when she began to feel more unsteady      Balance   Balance Assessed  Yes      Static Standing Balance   Static Standing - Balance Support  No upper extremity supported    Static Standing Balance -  Activities   Single Leg Stance - Right Leg;Single Leg Stance - Left Leg    Static Standing - Comment/# of Minutes  R: 1 sec or < with  no UE  L: 2 sec or < with no UE      Standardized Balance Assessment   Standardized Balance Assessment  Five Times Sit to Stand;Dynamic Gait Index    Five times sit to stand comments   21.4 sec from chair, no UE      Dynamic Gait Index   Level Surface  Mild Impairment    Change in Gait Speed  Moderate Impairment    Gait with Horizontal Head Turns  Moderate Impairment    Gait with Vertical Head Turns  Moderate Impairment    Gait and Pivot Turn  Mild Impairment    Step Over Obstacle  Severe Impairment    Step Around Obstacles  Mild Impairment    Steps  Moderate Impairment    Total Score  10          Objective measurements completed on examination: See above findings.        PT Education - 08/20/17 1440    Education provided  Yes    Education Details  exam findings, POC, HEP    Person(s) Educated  Patient    Methods  Explanation;Demonstration;Handout    Comprehension  Verbalized understanding;Returned demonstration       PT Short Term Goals - 08/20/17 1452      PT SHORT TERM GOAL #1   Title  Pt will be independent with HEP and perform consistently in order to maximize strength, balance, and decrease risk for falls.    Time  3    Period  Weeks    Status  New    Target Date  09/03/17      PT SHORT TERM GOAL #2   Title  Pt will have 1/2 grade improvement in MMT of all muscle groups tested in order to maximize gait, balance, and decrease risk for falls.    Time  3    Period  Weeks    Status  New      PT SHORT TERM GOAL #3   Title  Pt will be able to perform 5xSTS in 12 sec or < with no UE to demo improved balance and functional strength.    Time  3    Period  Weeks    Status  New        PT Long Term Goals - 08/20/17 1454      PT LONG TERM GOAL #1   Title  Pt will have 1 grade improvement in MMT or > in order to maximize gait on uneven ground and pt's ability to access her community with greater ease.    Time  6    Period  Weeks    Status  New    Target Date   10/01/17      PT LONG TERM GOAL #2   Title  Pt will score 16/24 or > on the DGI to demo clinically signicant change in pt's dynamic balance and to decrease risk for falls.    Time  6    Period  Weeks    Status  New      PT LONG TERM GOAL #3   Title  Pt will be able to perform SLS on BLE for 6 sec or > with no UE to maximize gait on uneven ground and decrease risk for falls.     Time  6    Period  Weeks    Status  New      PT LONG TERM GOAL #4   Title  Pt will report being able to stand and ambulate for 10 mins without requiring a rest break to demo improved functional strength and balance to maximize pt's participation at home and in the community.    Time  6    Period  Weeks    Status  New             Plan - 08/20/17 1447    Clinical Impression Statement  Pt is pleasant 67 YO F who presents to OPPT with c/o balance deficits, especially when her legs swell. Upon entry into gym, pt stating she is not sure why she is here as she feels fine. PT offered pt the opportunity to forego the evaluation if she felt like she didn't need therapy but pt wished to be evaluated today. Evaluation does reveal significant bil hip weakness, L slightly > than R, deficits in balance, gait, and overall functional strength. She also reports at least 50 falls or more over the last 6 months. She scored 10/24 on the DGI indicating she is at increased risk for falls. Pt also required 21 sec to complete the 5xSTS, also indicating deficits in balance and functional strength. She denies any heart conditions and states that her legs swell proximally versus distally; her MDs are currently following this and per pt, are still doing tests to determine the source of her swelling. Pt needs skilled PT intervention to address these deficits in order to decrease risk for falls and maximize overall QOL.    History and Personal Factors relevant to plan of care:  h/o CVA in 2016 with minor L sided residual weakness; DM2, HTN,  chronic pain, hypothyroidism, GERD, arthritis, anxiety    Clinical Presentation  Stable    Clinical Presentation due to:  MMT, SLS, DGI, 5xSTS, gait, functional strength    Clinical Decision Making  Low    Rehab Potential  Fair    PT Frequency  2x / week    PT Duration  6 weeks    PT Treatment/Interventions  ADLs/Self Care Home Management;Cryotherapy;Electrical Stimulation;Moist Heat;Traction;Ultrasound;DME Instruction;Gait training;Stair training;Functional mobility training;Therapeutic activities;Therapeutic exercise;Balance training;Neuromuscular re-education;Patient/family education;Manual techniques;Dry needling;Energy conservation;Taping;Vestibular    PT Next Visit Plan  review goals, HEP; initiate BLE and functional strengthening as well as balance and gait training    PT Home Exercise Plan  eval: STS    Recommended Other Services  potentially lymphedema eval to address swelling    Consulted and Agree with Plan of Care  Patient       Patient will benefit from skilled therapeutic intervention in order to improve the following deficits and impairments:  Abnormal gait, Decreased activity tolerance, Decreased balance, Decreased endurance, Decreased mobility, Decreased strength, Difficulty walking, Improper body mechanics, Postural dysfunction, Obesity  Visit Diagnosis: Muscle weakness (generalized) - Plan: PT plan of care cert/re-cert  Unsteadiness on feet - Plan: PT plan of care cert/re-cert  History of falling - Plan: PT plan of care cert/re-cert  Repeated falls - Plan: PT plan of care cert/re-cert     Problem List Patient Active Problem List   Diagnosis Date Noted  . PUD (peptic ulcer disease) 04/07/2016  . Constipation 04/07/2016  . Diverticulosis of colon without hemorrhage   . Rectal bleeding   . BRBPR (bright red blood per rectum) 01/25/2016  . Hypoglycemia 01/20/2016  . Upper GI bleeding 01/20/2016  . H/O: CVA (cerebrovascular accident) 01/20/2016  . Chronic pain  01/20/2016  . DM type 2 (diabetes mellitus, type 2) (HCC) 01/20/2016  . Benign essential HTN  01/20/2016  . Anxiety and depression 01/20/2016  . Blood loss anemia 01/20/2016  . Knee pain, acute   . Cephalalgia   . GERD (gastroesophageal reflux disease) 06/14/2011  . FH: colon cancer 04/05/2011  . History of colon polyps 04/05/2011  . Left sided abdominal pain 04/05/2011  . N&V (nausea and vomiting) 04/05/2011       Jac Canavan PT, DPT  [facility] [address] [place], [place], [address] Phone: [phone]   Fax:  [phone]  Name: KALEB LINQUIST MRN: [id number] Date of Birth: 06/18/1951

## 2017-08-20 NOTE — Patient Instructions (Signed)
  SIT TO STAND - NO SUPPORT  Start by scooting close to the front of the chair.  Next, lean forward at your trunk and reach forward with your arms and rise to standing without using your hands to push off from the chair or other object.   Use your arms as a counter-balance by reaching forward when in sitting and lower them as you approach standing.   Perform 1x/day, 2-3 sets of 10-15 reps 

## 2017-08-28 ENCOUNTER — Ambulatory Visit (HOSPITAL_COMMUNITY): Payer: Medicare HMO

## 2017-08-28 ENCOUNTER — Encounter (HOSPITAL_COMMUNITY): Payer: Self-pay

## 2017-08-28 DIAGNOSIS — M6281 Muscle weakness (generalized): Secondary | ICD-10-CM | POA: Diagnosis not present

## 2017-08-28 DIAGNOSIS — R296 Repeated falls: Secondary | ICD-10-CM

## 2017-08-28 DIAGNOSIS — R2681 Unsteadiness on feet: Secondary | ICD-10-CM

## 2017-08-28 DIAGNOSIS — Z9181 History of falling: Secondary | ICD-10-CM

## 2017-08-28 NOTE — Patient Instructions (Signed)
Bridge    Lie back, legs bent. Inhale, pressing hips up. Keeping ribs in, lengthen lower back. Exhale, rolling down along spine from top. Repeat 10 times. Do 2 sessions per day.  http://pm.exer.us/55   Copyright  VHI. All rights reserved.   Abduction: Clam (Eccentric) - Side-Lying    Lie on side with knees bent.  Tie green theraband around knees. Lift top knee, keeping feet together. Keep trunk steady. Slowly lower for 3-5 seconds. 10 reps per set, 1-2 sets per day.   http://ecce.exer.us/65   Copyright  VHI. All rights reserved.

## 2017-08-28 NOTE — Therapy (Addendum)
[facility] [address] [place], [place], [address] Phone: [phone]   Fax:  [phone]    Addendum on 09/04/17: PHYSICAL THERAPY DISCHARGE SUMMARY  Visits from Start of Care: 2  Current functional level related to goals / functional outcomes: See below   Remaining deficits: See below   Education / Equipment: See below Plan: Patient agrees to discharge.  Patient goals were not met. Patient is being discharged due to the patient's request.  ?????     Geraldine Solar PT, DPT   Physical Therapy Treatment  Patient Details  Name: Megan [name] MRN: [id number] Date of Birth: 1950/09/12 Referring Provider: Phillips Odor, MD   Encounter Date: 08/28/2017  PT End of Session - 08/28/17 1503    Visit Number  2    Number of Visits  13    Date for PT Re-Evaluation  09/10/17    Authorization Type  [employer] HMO    Authorization Time Period  08/20/17 to 10/01/17    PT Start Time  1500    PT Stop Time  1542    PT Time Calculation (min)  42 min    Equipment Utilized During Treatment  Gait belt    Activity Tolerance  Patient tolerated treatment well;Patient limited by fatigue    Behavior During Therapy  [facility] for tasks assessed/performed       Past Medical History:  Diagnosis Date  . Agoraphobia with panic disorder   . Anxiety   . Arthritis   . Chronic pain 03/08/15   Hydrocodone-APAP 10/325 mg, # 120 q month  . Colon polyps   . Complication of anesthesia   . Depression   . DM (diabetes mellitus) ([place])   . GERD (gastroesophageal reflux disease)   . HTN (hypertension)   . Hypothyroidism   . Obesity   . PONV (postoperative nausea and vomiting)   . Stroke ([place]) 08/20/2014   left sided weakness; some memory loss.    Past Surgical History:  Procedure Laterality Date  . ABDOMINAL HYSTERECTOMY  15 yrs ago   with bso at aph  . CARDIAC CATHETERIZATION  2004   normal  . CHOLECYSTECTOMY  25 yrs ago   [facility]  .  COLONOSCOPY  05/04/11   left-sided colonic diverticulosis  . COLONOSCOPY N/A 01/27/2016   diverticulosis in sigmoid colon and descending colon, active bleeding Dieulafoy lesion s/p clips X 3, next colonoscopy in 5 years due to family history of colon cancer    . ESOPHAGOGASTRODUODENOSCOPY  05/04/2011   Abnormal distal esophagus, biopsies consistent with inflammation due to acid reflux. No evidence of Barrett's. No H. pylori  . ESOPHAGOGASTRODUODENOSCOPY N/A 01/22/2016   erosive gastropathy, non-bleeding gastric ulcers and 1 oozing gastric ulcer with adherent clot s/p heater probe. Negative H.pylori serology.   . ESOPHAGOGASTRODUODENOSCOPY (EGD) WITH PROPOFOL N/A 04/27/2016   Dr. Gala Romney: healed ulcers. normal esophagus  . RADIOLOGY WITH ANESTHESIA N/A 08/20/2015   Procedure: RADIOLOGY WITH ANESTHESIA;  Surgeon: Luanne Bras, MD;  Location: [facility];  Service: Radiology;  Laterality: N/A;    There were no vitals filed for this visit.  Subjective Assessment - 08/28/17 1503    Subjective  Pt stated she is feeling good today, reports she has began HEP with improved ability with sit to stands.    Pertinent History  CVA in 2016 with residual L sided weakness    Patient Stated Goals  improvement of my legs and getting around    Currently in Pain?  No/denies  [facility] PT Treatment/Exercise - 08/28/17 0001      Exercises   Exercises  Knee/Hip      Knee/Hip Exercises: Standing   Heel Raises  10 reps    Heel Raises Limitations  toe raises 10x    Hip Abduction  Both;5 reps    Other Standing Knee Exercises  Tandem stance 2x 30"      Knee/Hip Exercises: Seated   Sit to Sand  10 reps;without UE support      Knee/Hip Exercises: Supine   Bridges  2 sets;10 reps    Bridges Limitations  3" holds      Knee/Hip Exercises: Sidelying   Clams  10x 5" wtih GTB             PT Education - 08/28/17 1510    Education provided  Yes    Education Details   Reviewed goals, assured compliance with HEP and copy of eval given to pt.    Person(s) Educated  Patient    Methods  Explanation;Demonstration;Handout    Comprehension  Verbalized understanding;Returned demonstration       PT Short Term Goals - 08/20/17 1452      PT SHORT TERM GOAL #1   Title  Pt will be independent with HEP and perform consistently in order to maximize strength, balance, and decrease risk for falls.    Time  3    Period  Weeks    Status  New    Target Date  09/03/17      PT SHORT TERM GOAL #2   Title  Pt will have 1/2 grade improvement in MMT of all muscle groups tested in order to maximize gait, balance, and decrease risk for falls.    Time  3    Period  Weeks    Status  New      PT SHORT TERM GOAL #3   Title  Pt will be able to perform 5xSTS in 12 sec or < with no UE to demo improved balance and functional strength.    Time  3    Period  Weeks    Status  New        PT Long Term Goals - 08/20/17 1454      PT LONG TERM GOAL #1   Title  Pt will have 1 grade improvement in MMT or > in order to maximize gait on uneven ground and pt's ability to access her community with greater ease.    Time  6    Period  Weeks    Status  New    Target Date  10/01/17      PT LONG TERM GOAL #2   Title  Pt will score 16/24 or > on the DGI to demo clinically signicant change in pt's dynamic balance and to decrease risk for falls.    Time  6    Period  Weeks    Status  New      PT LONG TERM GOAL #3   Title  Pt will be able to perform SLS on BLE for 6 sec or > with no UE to maximize gait on uneven ground and decrease risk for falls.     Time  6    Period  Weeks    Status  New      PT LONG TERM GOAL #4   Title  Pt will report being able to stand and ambulate for 10 mins without requiring a rest break to demo improved functional  strength and balance to maximize pt's participation at home and in the community.    Time  6    Period  Weeks    Status  New             Plan - 08/28/17 1545    Clinical Impression Statement  Reviewed goals, assured compliance with HEP and copy of eval given to pt.  Session focus on general strengthening for hip and LE as well as balance training.  Pt able to complete all exercises with therapist facilitation for proper form/technique, was limited by fatigue with activities.  No reoprts of pain through session.      Rehab Potential  Fair    PT Frequency  2x / week    PT Duration  6 weeks    PT Treatment/Interventions  ADLs/Self Care Home Management;Cryotherapy;Electrical Stimulation;Moist Heat;Traction;Ultrasound;DME Instruction;Gait training;Stair training;Functional mobility training;Therapeutic activities;Therapeutic exercise;Balance training;Neuromuscular re-education;Patient/family education;Manual techniques;Dry needling;Energy conservation;Taping;Vestibular    PT Next Visit Plan  Continue with BLE and functional strengthening as well as balance and gait training.  Begin rocker board, sidestep and tandem stance next session.    PT Home Exercise Plan  eval: STS; 2/12: bridge and clam       Patient will benefit from skilled therapeutic intervention in order to improve the following deficits and impairments:  Abnormal gait, Decreased activity tolerance, Decreased balance, Decreased endurance, Decreased mobility, Decreased strength, Difficulty walking, Improper body mechanics, Postural dysfunction, Obesity  Visit Diagnosis: Muscle weakness (generalized)  Unsteadiness on feet  History of falling  Repeated falls     Problem List Patient Active Problem List   Diagnosis Date Noted  . PUD (peptic ulcer disease) 04/07/2016  . Constipation 04/07/2016  . Diverticulosis of colon without hemorrhage   . Rectal bleeding   . BRBPR (bright red blood per rectum) 01/25/2016  . Hypoglycemia 01/20/2016  . Upper GI bleeding 01/20/2016  . H/O: CVA (cerebrovascular accident) 01/20/2016  . Chronic pain 01/20/2016  .  DM type 2 (diabetes mellitus, type 2) ([place]) 01/20/2016  . Benign essential HTN 01/20/2016  . Anxiety and depression 01/20/2016  . Blood loss anemia 01/20/2016  . Knee pain, acute   . Cephalalgia   . GERD (gastroesophageal reflux disease) 06/14/2011  . FH: colon cancer 04/05/2011  . History of colon polyps 04/05/2011  . Left sided abdominal pain 04/05/2011  . N&V (nausea and vomiting) 04/05/2011   Ihor Austin, LPTA; CBIS [phone]  Aldona Lento 08/28/2017, 3:54 PM  [facility] [address] [place], [place], [address] Phone: [phone]   Fax:  [phone]  Name: LANESSA SHILL MRN: [id number] Date of Birth: [birth year]

## 2017-08-31 ENCOUNTER — Ambulatory Visit (HOSPITAL_COMMUNITY): Payer: Medicare HMO

## 2017-08-31 ENCOUNTER — Telehealth (HOSPITAL_COMMUNITY): Payer: Self-pay

## 2017-08-31 NOTE — Telephone Encounter (Signed)
Patient left a message to cancel her appt °

## 2017-09-03 ENCOUNTER — Telehealth (HOSPITAL_COMMUNITY): Payer: Self-pay | Admitting: Family Medicine

## 2017-09-03 NOTE — Telephone Encounter (Signed)
09/03/17  pt cx said she couldn't afford to come

## 2017-09-04 ENCOUNTER — Encounter (HOSPITAL_COMMUNITY): Payer: Medicare HMO | Admitting: Physical Therapy

## 2017-09-07 ENCOUNTER — Encounter (HOSPITAL_COMMUNITY): Payer: Medicare HMO

## 2017-09-10 ENCOUNTER — Encounter (HOSPITAL_COMMUNITY): Payer: Medicare HMO | Admitting: Physical Therapy

## 2017-09-11 ENCOUNTER — Other Ambulatory Visit (HOSPITAL_COMMUNITY): Payer: Self-pay | Admitting: Family Medicine

## 2017-09-11 ENCOUNTER — Other Ambulatory Visit (HOSPITAL_COMMUNITY)
Admission: RE | Admit: 2017-09-11 | Discharge: 2017-09-11 | Disposition: A | Discharge status: Home / Self Care | Payer: Medicare HMO | Source: Ambulatory Visit | Attending: *Deleted | Admitting: *Deleted

## 2017-09-11 ENCOUNTER — Ambulatory Visit (HOSPITAL_COMMUNITY)
Admission: RE | Admit: 2017-09-11 | Discharge: 2017-09-11 | Disposition: A | Discharge status: Home / Self Care | Payer: Medicare HMO | Source: Ambulatory Visit | Attending: Family Medicine | Admitting: Family Medicine

## 2017-09-11 DIAGNOSIS — R918 Other nonspecific abnormal finding of lung field: Secondary | ICD-10-CM | POA: Diagnosis not present

## 2017-09-11 DIAGNOSIS — I517 Cardiomegaly: Secondary | ICD-10-CM | POA: Diagnosis not present

## 2017-09-11 DIAGNOSIS — R0600 Dyspnea, unspecified: Secondary | ICD-10-CM

## 2017-09-11 DIAGNOSIS — R05 Cough: Secondary | ICD-10-CM | POA: Insufficient documentation

## 2017-09-11 LAB — BASIC METABOLIC PANEL
Anion gap: 11 (ref 5–15)
BUN: 29 mg/dL — AB (ref 6–20)
CHLORIDE: 99 mmol/L — AB (ref 101–111)
CO2: 28 mmol/L (ref 22–32)
CREATININE: 1.15 mg/dL — AB (ref 0.44–1.00)
Calcium: 9.8 mg/dL (ref 8.9–10.3)
GFR, EST AFRICAN AMERICAN: 56 mL/min — AB (ref >60)
GFR, EST NON AFRICAN AMERICAN: 48 mL/min — AB (ref >60)
Glucose, Bld: 272 mg/dL — ABNORMAL HIGH (ref 65–99)
POTASSIUM: 4.6 mmol/L (ref 3.5–5.1)
Sodium: 138 mmol/L (ref 135–145)

## 2017-09-11 LAB — CBC
HEMATOCRIT: 34.1 % — AB (ref 36.0–46.0)
Hemoglobin: 10.7 g/dL — ABNORMAL LOW (ref 12.0–15.0)
MCH: 31.8 pg (ref 26.0–34.0)
MCHC: 31.4 g/dL (ref 30.0–36.0)
MCV: 101.2 fL — ABNORMAL HIGH (ref 78.0–100.0)
PLATELETS: 182 10*3/uL (ref 150–400)
RBC: 3.37 MIL/uL — AB (ref 3.87–5.11)
RDW: 13.8 % (ref 11.5–15.5)
WBC: 9.3 10*3/uL (ref 4.0–10.5)

## 2017-09-11 LAB — D-DIMER, QUANTITATIVE (NOT AT ARMC): D DIMER QUANT: 0.65 ug{FEU}/mL — AB (ref 0.00–0.50)

## 2017-09-12 ENCOUNTER — Encounter (HOSPITAL_COMMUNITY): Payer: Medicare HMO

## 2017-09-14 ENCOUNTER — Other Ambulatory Visit (HOSPITAL_COMMUNITY)
Admission: RE | Admit: 2017-09-14 | Discharge: 2017-09-14 | Disposition: A | Discharge status: Home / Self Care | Payer: Medicare HMO | Source: Ambulatory Visit | Attending: Family Medicine | Admitting: Family Medicine

## 2017-09-14 ENCOUNTER — Ambulatory Visit (HOSPITAL_COMMUNITY)
Admission: RE | Admit: 2017-09-14 | Discharge: 2017-09-14 | Disposition: A | Discharge status: Home / Self Care | Payer: Medicare HMO | Source: Ambulatory Visit | Attending: Family Medicine | Admitting: Family Medicine

## 2017-09-14 ENCOUNTER — Other Ambulatory Visit (HOSPITAL_COMMUNITY): Payer: Self-pay | Admitting: Family Medicine

## 2017-09-14 DIAGNOSIS — R0602 Shortness of breath: Secondary | ICD-10-CM | POA: Insufficient documentation

## 2017-09-14 DIAGNOSIS — Z9049 Acquired absence of other specified parts of digestive tract: Secondary | ICD-10-CM | POA: Diagnosis not present

## 2017-09-14 DIAGNOSIS — R1907 Generalized intra-abdominal and pelvic swelling, mass and lump: Secondary | ICD-10-CM

## 2017-09-14 DIAGNOSIS — R1900 Intra-abdominal and pelvic swelling, mass and lump, unspecified site: Secondary | ICD-10-CM | POA: Diagnosis present

## 2017-09-14 LAB — BRAIN NATRIURETIC PEPTIDE: B NATRIURETIC PEPTIDE 5: 476 pg/mL — AB (ref 0.0–100.0)

## 2017-09-17 ENCOUNTER — Encounter (HOSPITAL_COMMUNITY): Payer: Medicare HMO

## 2017-09-19 ENCOUNTER — Ambulatory Visit (HOSPITAL_COMMUNITY): Payer: Medicare HMO

## 2017-09-19 ENCOUNTER — Encounter (HOSPITAL_COMMUNITY): Payer: Medicare HMO

## 2017-09-24 ENCOUNTER — Encounter (HOSPITAL_COMMUNITY): Payer: Medicare HMO | Admitting: Physical Therapy

## 2017-09-26 ENCOUNTER — Encounter (HOSPITAL_COMMUNITY): Payer: Medicare HMO

## 2017-10-01 ENCOUNTER — Other Ambulatory Visit (HOSPITAL_COMMUNITY): Payer: Self-pay | Admitting: Family Medicine

## 2017-10-01 ENCOUNTER — Encounter (HOSPITAL_COMMUNITY): Payer: Medicare HMO

## 2017-10-01 DIAGNOSIS — I509 Heart failure, unspecified: Secondary | ICD-10-CM

## 2017-10-03 ENCOUNTER — Encounter (HOSPITAL_COMMUNITY): Payer: Medicare HMO

## 2017-10-04 ENCOUNTER — Ambulatory Visit (HOSPITAL_COMMUNITY)
Admission: RE | Admit: 2017-10-04 | Discharge: 2017-10-04 | Disposition: A | Discharge status: Home / Self Care | Payer: Medicare HMO | Source: Ambulatory Visit | Attending: Family Medicine | Admitting: Family Medicine

## 2017-10-04 ENCOUNTER — Other Ambulatory Visit: Payer: Self-pay | Admitting: Gastroenterology

## 2017-10-04 DIAGNOSIS — I509 Heart failure, unspecified: Secondary | ICD-10-CM

## 2017-10-04 NOTE — Progress Notes (Signed)
Patient came in for echo. Once in the echo lab, patient informed me she had a rash under her breast and didn't know if she would be able to do this test. Patient has what appears to be large areas of yeast under both breasts. I informed the patient that it was her choice about whether to do the echo. The patient stated she was not in any distress. I attempted to contact Dr.Knowlton's office which is closed. Patient asked me several times should she do it and I informed her I could not decide that. Patient decided it would be too painful to have the test and rescheduled for next week. I told patient multiple times to call Dr.Knowlton in the morning to get treatment for her infection. I will also try to attempt Dr.Knowlton's office this afternoon and make them aware of the situation.  Megan ColumbiaJohanna Kayson Tasker, RDCS [place]

## 2017-10-10 ENCOUNTER — Ambulatory Visit (HOSPITAL_COMMUNITY)
Admission: RE | Admit: 2017-10-10 | Discharge: 2017-10-10 | Disposition: A | Discharge status: Home / Self Care | Payer: Medicare HMO | Source: Ambulatory Visit | Attending: Family Medicine | Admitting: Family Medicine

## 2017-10-10 DIAGNOSIS — I509 Heart failure, unspecified: Secondary | ICD-10-CM | POA: Insufficient documentation

## 2017-10-10 DIAGNOSIS — I110 Hypertensive heart disease with heart failure: Secondary | ICD-10-CM | POA: Diagnosis not present

## 2017-10-10 DIAGNOSIS — E119 Type 2 diabetes mellitus without complications: Secondary | ICD-10-CM | POA: Diagnosis not present

## 2017-10-10 DIAGNOSIS — Z87891 Personal history of nicotine dependence: Secondary | ICD-10-CM | POA: Insufficient documentation

## 2017-10-10 DIAGNOSIS — K219 Gastro-esophageal reflux disease without esophagitis: Secondary | ICD-10-CM | POA: Insufficient documentation

## 2017-10-10 DIAGNOSIS — Z8673 Personal history of transient ischemic attack (TIA), and cerebral infarction without residual deficits: Secondary | ICD-10-CM | POA: Insufficient documentation

## 2017-10-10 NOTE — Progress Notes (Signed)
*  PRELIMINARY RESULTS* Echocardiogram 2D Echocardiogram has been performed.  Megan [name], Megan [name] 10/10/2017, 11:10 AM

## 2017-10-26 ENCOUNTER — Ambulatory Visit (INDEPENDENT_AMBULATORY_CARE_PROVIDER_SITE_OTHER): Payer: Medicare Other | Admitting: Internal Medicine

## 2017-10-26 ENCOUNTER — Encounter: Payer: Self-pay | Admitting: Internal Medicine

## 2017-10-26 ENCOUNTER — Encounter (INDEPENDENT_AMBULATORY_CARE_PROVIDER_SITE_OTHER): Payer: Self-pay

## 2017-10-26 VITALS — BP 122/84 | HR 73 | Ht 62.0 in | Wt 218.0 lb

## 2017-10-26 DIAGNOSIS — I10 Essential (primary) hypertension: Secondary | ICD-10-CM | POA: Diagnosis not present

## 2017-10-26 DIAGNOSIS — R609 Edema, unspecified: Secondary | ICD-10-CM

## 2017-10-26 DIAGNOSIS — R0602 Shortness of breath: Secondary | ICD-10-CM

## 2017-10-26 DIAGNOSIS — E782 Mixed hyperlipidemia: Secondary | ICD-10-CM

## 2017-10-26 LAB — LIPID PANEL
CHOLESTEROL: 232 mg/dL — AB (ref <200)
HDL: 52 mg/dL (ref >50)
LDL CHOLESTEROL (CALC): 151 mg/dL — AB
Non-HDL Cholesterol (Calc): 180 mg/dL (calc) — ABNORMAL HIGH (ref <130)
TRIGLYCERIDES: 156 mg/dL — AB (ref <150)
Total CHOL/HDL Ratio: 4.5 (calc) (ref <5.0)

## 2017-10-26 LAB — TSH: TSH: 0.41 mIU/L (ref 0.40–4.50)

## 2017-10-26 NOTE — Patient Instructions (Addendum)
Your physician recommends that you schedule a follow-up appointment in:  To be determined after tests    Get lab work today    No change in your medications   Please schedule Coronary CT at [facility]   Please [facility] the [facility] main entrance of [facility] at xx:xx AM (30-45 minutes prior to test start time)  [facility] [address] [place], [place] [address] [phone]  Proceed to the [facility] Radiology Department (First Floor).  Please follow these instructions carefully (unless otherwise directed):    On the Night Before the Test: . Drink plenty of water. . Do not consume any caffeinated/decaffeinated beverages or chocolate 12 hours prior to your test. . Do not take any antihistamines 12 hours prior to your test. . If you take Metformin do not take 24 hours prior to test. .  IOn the Day of the Test: . Drink plenty of water. Do not drink any water within one hour of the test. . Do not eat any food 4 hours prior to the test. . You may take your regular medications prior to the test. . IF NOT ON A BETA BLOCKER - Take 50 mg of lopressor (metoprolol) one hour before the test. . HOLD Furosemide morning of the test.  After the Test: . Drink plenty of water. . After receiving IV contrast, you may experience a mild flushed feeling. This is normal. . On occasion, you may experience a mild rash up to 24 hours after the test. This is not dangerous. If this occurs, you can take Benadryl 25 mg and increase your fluid intake. . If you experience trouble breathing, this can be serious. If it is severe call 911 IMMEDIATELY. If it is mild, please call our office. . If you take any of these medications: Glipizide/Metformin, Avandament, Glucavance, please do not take 48 hours after completing test.

## 2017-10-26 NOTE — Progress Notes (Addendum)
Cardiology Office Note   Date:  10/26/2017   ID:  Megan [name], DOB 1950/11/28, MRN [id number]  PCP:  [name], MD  Cardiologist:   Dietrich Pates, MD    Pt referred by Michelle Nasuti for cardiac eval   R/O CAD     History of Present Illness: Megan [name] is a 67 y.o. female who is referred for assessment  She has no known CAD    Followed by Michelle Nasuti.   COncern she may have had heart event in past Pt says she gets SOB with activity    Feels heart pounding with activity   Doesn't do much around the house  Had one episode of CP about 1 month ago   Lasted about 10 min  ? Indigestion Pt says it has been going on for a few months Pt says she hasnt felt right since had stroke  In Feb 2016    Current Meds  Medication Sig  . apixaban (ELIQUIS) 5 MG TABS tablet Take 1 tablet (5 mg total) by mouth 2 (two) times daily. Restart on 7/14  . atorvastatin (LIPITOR) 20 MG tablet Take 20 mg by mouth at bedtime.   . Cholecalciferol (VITAMIN D-3 PO) Take 1 tablet by mouth daily.  . diazepam (VALIUM) 10 MG tablet Take 1 tablet by mouth at bedtime as needed for sleep.   . enalapril (VASOTEC) 20 MG tablet Take 20 mg by mouth 2 (two) times daily.   Marland Kitchen escitalopram (LEXAPRO) 10 MG tablet Take 1 tablet by mouth daily.  . furosemide (LASIX) 40 MG tablet Take 1 tablet by mouth as needed.  Marland Kitchen glimepiride (AMARYL) 2 MG tablet Take 2 mg by mouth daily with breakfast.  . glipiZIDE (GLUCOTROL) 10 MG tablet Take 10 mg by mouth every morning. In the AM  . hydrochlorothiazide (HYDRODIURIL) 25 MG tablet Take 25 mg by mouth daily.    Marland Kitchen HYDROcodone-acetaminophen (NORCO) 10-325 MG tablet Take 1 tablet by mouth every 6 (six) hours as needed for moderate pain.  Marland Kitchen levofloxacin (LEVAQUIN) 500 MG tablet   . levothyroxine (SYNTHROID, LEVOTHROID) 25 MCG tablet Take 25 mcg by mouth daily.  Marland Kitchen linaclotide (LINZESS) 72 MCG capsule Take 72 mcg by mouth as needed.  Marland Kitchen MELATONIN PO Take 1 tablet by mouth daily as  needed (sleep).  . metFORMIN (GLUCOPHAGE) 500 MG tablet Take 1,000 mg by mouth 2 (two) times daily with a meal.   . metoprolol (TOPROL-XL) 50 MG 24 hr tablet Take 50 mg by mouth daily with lunch.   . pantoprazole (PROTONIX) 40 MG tablet TAKE (1) TABLET BY MOUTH TWICE DAILY BEFORE A MEAL.  . pioglitazone (ACTOS) 15 MG tablet Take 15 mg by mouth daily.  . predniSONE (DELTASONE) 5 MG tablet Take 1 tablet by mouth daily.  . temazepam (RESTORIL) 30 MG capsule Take 1 capsule by mouth at bedtime as needed.  . traZODone (DESYREL) 50 MG tablet Take 1 tablet by mouth at bedtime.     Allergies:   Naproxen   Past Medical History:  Diagnosis Date  . Agoraphobia with panic disorder   . Anxiety   . Arthritis   . Chronic pain 03/08/15   Hydrocodone-APAP 10/325 mg, # 120 q month  . Colon polyps   . Complication of anesthesia   . Depression   . DM (diabetes mellitus) (HCC)   . GERD (gastroesophageal reflux disease)   . HTN (hypertension)   . Hypothyroidism   . Obesity   . PONV (postoperative  nausea and vomiting)   . Stroke (HCC) 08/20/2014   left sided weakness; some memory loss.    Past Surgical History:  Procedure Laterality Date  . ABDOMINAL HYSTERECTOMY  15 yrs ago   with bso at aph  . CARDIAC CATHETERIZATION  2004   normal  . CHOLECYSTECTOMY  25 yrs ago   [facility]  . COLONOSCOPY  05/04/11   left-sided colonic diverticulosis  . COLONOSCOPY N/A 01/27/2016   diverticulosis in sigmoid colon and descending colon, active bleeding Dieulafoy lesion s/p clips X 3, next colonoscopy in 5 years due to family history of colon cancer    . ESOPHAGOGASTRODUODENOSCOPY  05/04/2011   Abnormal distal esophagus, biopsies consistent with inflammation due to acid reflux. No evidence of Barrett's. No H. pylori  . ESOPHAGOGASTRODUODENOSCOPY N/A 01/22/2016   erosive gastropathy, non-bleeding gastric ulcers and 1 oozing gastric ulcer with adherent clot s/p heater probe. Negative H.pylori serology.   .  ESOPHAGOGASTRODUODENOSCOPY (EGD) WITH PROPOFOL N/A 04/27/2016   Dr. Jena Gaussourk: healed ulcers. normal esophagus  . RADIOLOGY WITH ANESTHESIA N/A 08/20/2015   Procedure: RADIOLOGY WITH ANESTHESIA;  Surgeon: Julieanne CottonSanjeev Deveshwar, MD;  Location: [facility] OR;  Service: Radiology;  Laterality: N/A;     Social History:  The patient  reports that she quit smoking about 46 years ago. Her smoking use included cigarettes. She started smoking about 48 years ago. She has a 3.75 pack-year smoking history. She quit smokeless tobacco use about 3 years ago. Her smokeless tobacco use included snuff. She reports that she does not drink alcohol or use drugs.   Family History:  [name] family history includes Colon cancer in her father, paternal aunt, paternal uncle, sister, and unknown relative.    ROS:  Please see the history of present illness. All other systems are reviewed and  Negative to the above problem except as noted.    PHYSICAL EXAM: VS:  BP 122/84 (BP Location: Left Arm)   Pulse 73   Ht 5\' 2"  (1.575 m)   Wt 218 lb (98.9 kg)   SpO2 96%   BMI 39.87 kg/m   GEN: Morbidly obese 67 yo in no acute distress   Examined in chair HEENT: normal  Neck: no JVD, carotid bruits, or masses Cardiac: RRR; no murmurs, rubs, or gallops,1+ LE edema  Respiratory:  clear to auscultation bilaterally, normal work of breathing GI: soft, nontender, nondistended, + BS  No hepatomegaly  MS: no deformity Moving all extremities   Skin: warm and dry, no rash Neuro:  Deferred   L arm weak    EKG:  EKG is ordered today.  SR 76 bpm     Lipid Panel    Component Value Date/Time   CHOL 117 08/21/2015 0435   TRIG 85 08/21/2015 0435   HDL 43 08/21/2015 0435   CHOLHDL 2.7 08/21/2015 0435   VLDL 17 08/21/2015 0435   LDLCALC 57 08/21/2015 0435      Wt Readings from Last 3 Encounters:  10/26/17 218 lb (98.9 kg)  06/21/17 206 lb 12.8 oz (93.8 kg)  10/26/16 195 lb 12.8 oz (88.8 kg)      ASSESSMENT AND PLAN:  1  Dypsnea.   Concerning Echo:   LVEF normal   Pt with some volume increase on exam   Will get labs   Discuss changes in meds after review    May switch to torsemide for more concsistent absorption With long standing histotry of DM (at least 20 years) I would recomm coronary CTA to evaluate for  dz     2  Edema  As above   Check labs  Changes in meds will depend on results    Discussed low salt   She says she is observing  This   3  HTN   Follow on current meds  4   HL   Good control of LDL in 2017  WIll recheck  5  DM  Followes with S Knowlton    Current medicines are reviewed at length with the patient today.  The patient does not have concerns regarding medicines.  Signed, Dietrich Pates, MD  10/26/2017 8:47 AM    [facility] [address] [place], [place], [place]  [address] Phone: [phone]; Fax: [phone]

## 2017-10-29 ENCOUNTER — Telehealth: Payer: Self-pay

## 2017-10-29 ENCOUNTER — Telehealth: Payer: Self-pay | Admitting: Internal Medicine

## 2017-10-29 DIAGNOSIS — E782 Mixed hyperlipidemia: Secondary | ICD-10-CM

## 2017-10-29 MED ORDER — ROSUVASTATIN CALCIUM 40 MG PO TABS: 40.0000 mg | ORAL_TABLET | Freq: Every day | ORAL | 3 refills | Status: DC

## 2017-10-29 NOTE — Telephone Encounter (Signed)
Bryant patient.  Will forward.

## 2017-10-29 NOTE — Telephone Encounter (Signed)
Becky calling with Dr. Sudie BaileyKnowlton office,  She is calling in regards to consult note from Dr. Tenny Crawoss from 10-26-17  and would like to see why patient needs CT Coronary scan.

## 2017-10-29 NOTE — Telephone Encounter (Signed)
I will forward to Dr.Ross  

## 2017-10-29 NOTE — Telephone Encounter (Signed)
Forward note to Dr Sudie BaileyKnowlton

## 2017-10-29 NOTE — Telephone Encounter (Signed)
-----   Message from Pricilla RifflePaula Ross V, MD sent at 10/27/2017 10:09 PM EDT ----- LDL is high at 151   With DM needs to be lower, at least as low as 100, possibly lower Is she taking 20 mg Lipitor at night If she is I would switch to 40 mg crestor F/U lipids in 8 wks If she is not, I would start   F/U lipids in 8 wks

## 2017-10-29 NOTE — Telephone Encounter (Signed)
Pt will switch to crestor 40 mg and repeat labs in 8 weeks

## 2017-10-30 ENCOUNTER — Telehealth: Payer: Self-pay | Admitting: *Deleted

## 2017-10-30 NOTE — Telephone Encounter (Signed)
Cardiology note sent to pcp

## 2017-10-30 NOTE — Telephone Encounter (Signed)
[facility] Dr. Michelle NasutiKnowlton's office, to find out the reason for the coronary cta.  Reviewed ov note with her.  Questions addressed.  Adv to call back if needs anything further.

## 2017-11-14 ENCOUNTER — Emergency Department (HOSPITAL_COMMUNITY): Payer: Medicare Other

## 2017-11-14 ENCOUNTER — Other Ambulatory Visit: Payer: Self-pay

## 2017-11-14 ENCOUNTER — Observation Stay (HOSPITAL_COMMUNITY)
Admission: EM | Admit: 2017-11-14 | Discharge: 2017-11-19 | Disposition: A | Discharge status: Home / Self Care | Payer: Medicare Other | Attending: Family Medicine | Admitting: Family Medicine

## 2017-11-14 ENCOUNTER — Encounter (HOSPITAL_COMMUNITY): Payer: Self-pay | Admitting: Emergency Medicine

## 2017-11-14 DIAGNOSIS — Z8673 Personal history of transient ischemic attack (TIA), and cerebral infarction without residual deficits: Secondary | ICD-10-CM

## 2017-11-14 DIAGNOSIS — Z87891 Personal history of nicotine dependence: Secondary | ICD-10-CM | POA: Insufficient documentation

## 2017-11-14 DIAGNOSIS — I482 Chronic atrial fibrillation, unspecified: Secondary | ICD-10-CM | POA: Diagnosis present

## 2017-11-14 DIAGNOSIS — R2681 Unsteadiness on feet: Secondary | ICD-10-CM | POA: Insufficient documentation

## 2017-11-14 DIAGNOSIS — I639 Cerebral infarction, unspecified: Secondary | ICD-10-CM | POA: Insufficient documentation

## 2017-11-14 DIAGNOSIS — G9341 Metabolic encephalopathy: Secondary | ICD-10-CM | POA: Diagnosis not present

## 2017-11-14 DIAGNOSIS — R531 Weakness: Principal | ICD-10-CM | POA: Insufficient documentation

## 2017-11-14 DIAGNOSIS — E11649 Type 2 diabetes mellitus with hypoglycemia without coma: Secondary | ICD-10-CM | POA: Diagnosis not present

## 2017-11-14 DIAGNOSIS — I10 Essential (primary) hypertension: Secondary | ICD-10-CM | POA: Diagnosis not present

## 2017-11-14 DIAGNOSIS — E039 Hypothyroidism, unspecified: Secondary | ICD-10-CM | POA: Diagnosis not present

## 2017-11-14 DIAGNOSIS — E119 Type 2 diabetes mellitus without complications: Secondary | ICD-10-CM

## 2017-11-14 DIAGNOSIS — G894 Chronic pain syndrome: Secondary | ICD-10-CM | POA: Diagnosis not present

## 2017-11-14 DIAGNOSIS — G8929 Other chronic pain: Secondary | ICD-10-CM | POA: Diagnosis present

## 2017-11-14 DIAGNOSIS — Z7901 Long term (current) use of anticoagulants: Secondary | ICD-10-CM

## 2017-11-14 DIAGNOSIS — Z79899 Other long term (current) drug therapy: Secondary | ICD-10-CM | POA: Diagnosis not present

## 2017-11-14 DIAGNOSIS — E162 Hypoglycemia, unspecified: Secondary | ICD-10-CM | POA: Diagnosis present

## 2017-11-14 LAB — URINALYSIS, ROUTINE W REFLEX MICROSCOPIC
Bilirubin Urine: NEGATIVE
Glucose, UA: NEGATIVE mg/dL
HGB URINE DIPSTICK: NEGATIVE
Ketones, ur: NEGATIVE mg/dL
Leukocytes, UA: NEGATIVE
Nitrite: NEGATIVE
Protein, ur: NEGATIVE mg/dL
SPECIFIC GRAVITY, URINE: 1.011 (ref 1.005–1.030)
pH: 5 (ref 5.0–8.0)

## 2017-11-14 LAB — APTT: APTT: 41 s — AB (ref 24–36)

## 2017-11-14 LAB — PROTIME-INR
INR: 1.31
Prothrombin Time: 16.2 seconds — ABNORMAL HIGH (ref 11.4–15.2)

## 2017-11-14 LAB — COMPREHENSIVE METABOLIC PANEL
ALK PHOS: 119 U/L (ref 38–126)
ALT: 14 U/L (ref 14–54)
ANION GAP: 12 (ref 5–15)
AST: 21 U/L (ref 15–41)
Albumin: 3.7 g/dL (ref 3.5–5.0)
BUN: 37 mg/dL — ABNORMAL HIGH (ref 6–20)
CALCIUM: 10.1 mg/dL (ref 8.9–10.3)
CO2: 28 mmol/L (ref 22–32)
Chloride: 101 mmol/L (ref 101–111)
Creatinine, Ser: 1.58 mg/dL — ABNORMAL HIGH (ref 0.44–1.00)
GFR calc non Af Amer: 33 mL/min — ABNORMAL LOW (ref >60)
GFR, EST AFRICAN AMERICAN: 38 mL/min — AB (ref >60)
Glucose, Bld: 77 mg/dL (ref 65–99)
Potassium: 4.1 mmol/L (ref 3.5–5.1)
Sodium: 141 mmol/L (ref 135–145)
TOTAL PROTEIN: 7.7 g/dL (ref 6.5–8.1)
Total Bilirubin: 0.5 mg/dL (ref 0.3–1.2)

## 2017-11-14 LAB — I-STAT CHEM 8, ED
BUN: 33 mg/dL — ABNORMAL HIGH (ref 6–20)
CALCIUM ION: 1.24 mmol/L (ref 1.15–1.40)
Chloride: 101 mmol/L (ref 101–111)
Creatinine, Ser: 1.5 mg/dL — ABNORMAL HIGH (ref 0.44–1.00)
GLUCOSE: 74 mg/dL (ref 65–99)
HCT: 36 % (ref 36.0–46.0)
HEMOGLOBIN: 12.2 g/dL (ref 12.0–15.0)
Potassium: 4.2 mmol/L (ref 3.5–5.1)
Sodium: 140 mmol/L (ref 135–145)
TCO2: 27 mmol/L (ref 22–32)

## 2017-11-14 LAB — DIFFERENTIAL
Basophils Absolute: 0 10*3/uL (ref 0.0–0.1)
Basophils Relative: 0 %
EOS PCT: 1 %
Eosinophils Absolute: 0.1 10*3/uL (ref 0.0–0.7)
LYMPHS ABS: 1.6 10*3/uL (ref 0.7–4.0)
LYMPHS PCT: 26 %
MONO ABS: 0.4 10*3/uL (ref 0.1–1.0)
Monocytes Relative: 7 %
Neutro Abs: 3.9 10*3/uL (ref 1.7–7.7)
Neutrophils Relative %: 66 %

## 2017-11-14 LAB — CBG MONITORING, ED
GLUCOSE-CAPILLARY: 45 mg/dL — AB (ref 65–99)
GLUCOSE-CAPILLARY: 103 mg/dL — AB (ref 65–99)
GLUCOSE-CAPILLARY: 64 mg/dL — AB (ref 65–99)
GLUCOSE-CAPILLARY: 75 mg/dL (ref 65–99)
Glucose-Capillary: 141 mg/dL — ABNORMAL HIGH (ref 65–99)
Glucose-Capillary: 106 mg/dL — ABNORMAL HIGH (ref 65–99)
Glucose-Capillary: 64 mg/dL — ABNORMAL LOW (ref 65–99)

## 2017-11-14 LAB — GLUCOSE, CAPILLARY: GLUCOSE-CAPILLARY: 96 mg/dL (ref 65–99)

## 2017-11-14 LAB — CBC
HCT: 36 % (ref 36.0–46.0)
Hemoglobin: 11.7 g/dL — ABNORMAL LOW (ref 12.0–15.0)
MCH: 30.9 pg (ref 26.0–34.0)
MCHC: 32.5 g/dL (ref 30.0–36.0)
MCV: 95 fL (ref 78.0–100.0)
PLATELETS: 218 10*3/uL (ref 150–400)
RBC: 3.79 MIL/uL — ABNORMAL LOW (ref 3.87–5.11)
RDW: 13 % (ref 11.5–15.5)
WBC: 5.9 10*3/uL (ref 4.0–10.5)

## 2017-11-14 LAB — I-STAT TROPONIN, ED: Troponin i, poc: 0 ng/mL (ref 0.00–0.08)

## 2017-11-14 LAB — RAPID URINE DRUG SCREEN, HOSP PERFORMED
Amphetamines: NOT DETECTED
BENZODIAZEPINES: POSITIVE — AB
Barbiturates: NOT DETECTED
COCAINE: NOT DETECTED
Opiates: POSITIVE — AB
Tetrahydrocannabinol: NOT DETECTED

## 2017-11-14 MED ORDER — ESCITALOPRAM OXALATE 10 MG PO TABS
10.0000 mg | ORAL_TABLET | Freq: Every day | ORAL | Status: DC
  Administered 2017-11-15 – 2017-11-19 (×4): 10 mg via ORAL
  Filled 2017-11-14 (×6): qty 1

## 2017-11-14 MED ORDER — APIXABAN 5 MG PO TABS
5.0000 mg | ORAL_TABLET | Freq: Two times a day (BID) | ORAL | Status: DC
Start: 2017-11-14 — End: 2017-11-19
  Administered 2017-11-15 – 2017-11-19 (×9): 5 mg via ORAL
  Filled 2017-11-14 (×9): qty 1

## 2017-11-14 MED ORDER — THIAMINE HCL 100 MG/ML IJ SOLN
100.0000 mg | Freq: Once | INTRAMUSCULAR | Status: AC
  Administered 2017-11-14: 100 mg via INTRAVENOUS

## 2017-11-14 MED ORDER — SODIUM CHLORIDE 0.9% FLUSH: 3.0000 mL | INTRAVENOUS | Status: DC | PRN

## 2017-11-14 MED ORDER — DEXTROSE 50 % IV SOLN
INTRAVENOUS | Status: AC
  Filled 2017-11-14: qty 50

## 2017-11-14 MED ORDER — DEXTROSE 50 % IV SOLN
1.0000 | Freq: Once | INTRAVENOUS | Status: AC
  Administered 2017-11-14: 50 mL via INTRAVENOUS

## 2017-11-14 MED ORDER — LEVOTHYROXINE SODIUM 25 MCG PO TABS
25.0000 ug | ORAL_TABLET | Freq: Every day | ORAL | Status: DC
  Administered 2017-11-15 – 2017-11-19 (×4): 25 ug via ORAL
  Filled 2017-11-14 (×5): qty 1

## 2017-11-14 MED ORDER — METOPROLOL SUCCINATE ER 50 MG PO TB24
50.0000 mg | ORAL_TABLET | Freq: Every day | ORAL | Status: DC
  Administered 2017-11-15: 50 mg via ORAL
  Filled 2017-11-14: qty 1

## 2017-11-14 MED ORDER — ENALAPRIL MALEATE 5 MG PO TABS
20.0000 mg | ORAL_TABLET | Freq: Two times a day (BID) | ORAL | Status: DC
  Administered 2017-11-15 – 2017-11-19 (×8): 20 mg via ORAL
  Filled 2017-11-14: qty 4
  Filled 2017-11-14: qty 1
  Filled 2017-11-14 (×2): qty 4
  Filled 2017-11-14: qty 1
  Filled 2017-11-14 (×6): qty 4
  Filled 2017-11-14 (×2): qty 1

## 2017-11-14 MED ORDER — HYDROCHLOROTHIAZIDE 25 MG PO TABS
25.0000 mg | ORAL_TABLET | Freq: Every day | ORAL | Status: DC
  Administered 2017-11-15 – 2017-11-17 (×2): 25 mg via ORAL
  Filled 2017-11-14 (×3): qty 1

## 2017-11-14 MED ORDER — SODIUM CHLORIDE 0.9 % IV SOLN
250.0000 mL | INTRAVENOUS | Status: DC | PRN
Start: 2017-11-14 — End: 2017-11-19

## 2017-11-14 MED ORDER — SODIUM CHLORIDE 0.9% FLUSH
3.0000 mL | Freq: Two times a day (BID) | INTRAVENOUS | Status: DC
  Administered 2017-11-14 – 2017-11-19 (×6): 3 mL via INTRAVENOUS

## 2017-11-14 MED ORDER — DEXTROSE 5 % IV SOLN
INTRAVENOUS | Status: DC
  Administered 2017-11-14: 23:00:00 via INTRAVENOUS

## 2017-11-14 NOTE — ED Notes (Signed)
Pt resting with eyes shut.  Call name loudly for pt to arouse.  Pt no distress.

## 2017-11-14 NOTE — ED Notes (Signed)
Pt resting with eyes shut.  No distress.  

## 2017-11-14 NOTE — ED Provider Notes (Signed)
[facility] EMERGENCY DEPARTMENT Provider Note   CSN: [id number] Arrival date & time: 11/14/17  1326     History   Chief Complaint Chief Complaint  Patient presents with  . Weakness    ? stroke 2 days  Level 5 caveat patient and husband are extremely poor historians.  HPI Megan [name] is a 67 y.o. female.  HPI Complains of left arm and left leg weakness over the past 4 days.  Other associated symptoms include nausea.  She is been unable unable to walk for the past 4 days without assistance of her husband.  She fell yesterday striking her head.  She denies pain anywhere.  Other associated symptoms include nausea, no vomiting.  No fever.  No headache seen at Dr.Knowlton's office prior to coming here today.  Sent here for further evaluation no treatment prior to coming here nothing makes symptoms better or worse Past Medical History:  Diagnosis Date  . Agoraphobia with panic disorder   . Anxiety   . Arthritis   . Chronic pain 03/08/15   Hydrocodone-APAP 10/325 mg, # 120 q month  . Colon polyps   . Complication of anesthesia   . Depression   . DM (diabetes mellitus) (HCC)   . GERD (gastroesophageal reflux disease)   . HTN (hypertension)   . Hypothyroidism   . Obesity   . PONV (postoperative nausea and vomiting)   . Stroke (HCC) 08/20/2014   left sided weakness; some memory loss.    Patient Active Problem List   Diagnosis Date Noted  . PUD (peptic ulcer disease) 04/07/2016  . Constipation 04/07/2016  . Diverticulosis of colon without hemorrhage   . Rectal bleeding   . BRBPR (bright red blood per rectum) 01/25/2016  . Hypoglycemia 01/20/2016  . Upper GI bleeding 01/20/2016  . H/O: CVA (cerebrovascular accident) 01/20/2016  . Chronic pain 01/20/2016  . DM type 2 (diabetes mellitus, type 2) (HCC) 01/20/2016  . Benign essential HTN 01/20/2016  . Anxiety and depression 01/20/2016  . Blood loss anemia 01/20/2016  . Knee pain, acute   . Cephalalgia   . GERD  (gastroesophageal reflux disease) 06/14/2011  . FH: colon cancer 04/05/2011  . History of colon polyps 04/05/2011  . Left sided abdominal pain 04/05/2011  . N&V (nausea and vomiting) 04/05/2011    Past Surgical History:  Procedure Laterality Date  . ABDOMINAL HYSTERECTOMY  15 yrs ago   with bso at aph  . CARDIAC CATHETERIZATION  2004   normal  . CHOLECYSTECTOMY  25 yrs ago   [facility]  . COLONOSCOPY  05/04/11   left-sided colonic diverticulosis  . COLONOSCOPY N/A 01/27/2016   diverticulosis in sigmoid colon and descending colon, active bleeding Dieulafoy lesion s/p clips X 3, next colonoscopy in 5 years due to family history of colon cancer    . ESOPHAGOGASTRODUODENOSCOPY  05/04/2011   Abnormal distal esophagus, biopsies consistent with inflammation due to acid reflux. No evidence of Barrett's. No H. pylori  . ESOPHAGOGASTRODUODENOSCOPY N/A 01/22/2016   erosive gastropathy, non-bleeding gastric ulcers and 1 oozing gastric ulcer with adherent clot s/p heater probe. Negative H.pylori serology.   . ESOPHAGOGASTRODUODENOSCOPY (EGD) WITH PROPOFOL N/A 04/27/2016   Dr. Jena Gauss: healed ulcers. normal esophagus  . RADIOLOGY WITH ANESTHESIA N/A 08/20/2015   Procedure: RADIOLOGY WITH ANESTHESIA;  Surgeon: Julieanne Cotton, MD;  Location: [facility] OR;  Service: Radiology;  Laterality: N/A;     OB History   None      Home Medications    Prior to  Admission medications   Medication Sig Start Date End Date Taking? Authorizing Provider  apixaban (ELIQUIS) 5 MG TABS tablet Take 1 tablet (5 mg total) by mouth 2 (two) times daily. Restart on 7/14 01/27/16   Erick Blinks, MD  Cholecalciferol (VITAMIN D-3 PO) Take 1 tablet by mouth daily.    [provider]  diazepam (VALIUM) 10 MG tablet Take 1 tablet by mouth at bedtime as needed for sleep.  12/10/15   [provider]  enalapril (VASOTEC) 20 MG tablet Take 20 mg by mouth 2 (two) times daily.     [provider]  escitalopram  (LEXAPRO) 10 MG tablet Take 1 tablet by mouth daily. 05/23/17   [provider]  furosemide (LASIX) 40 MG tablet Take 1 tablet by mouth as needed. 04/24/17   [provider]  glimepiride (AMARYL) 2 MG tablet Take 2 mg by mouth daily with breakfast.    [provider]  glipiZIDE (GLUCOTROL) 10 MG tablet Take 10 mg by mouth every morning. In the AM    [provider]  hydrochlorothiazide (HYDRODIURIL) 25 MG tablet Take 25 mg by mouth daily.      [provider]  HYDROcodone-acetaminophen (NORCO) 10-325 MG tablet Take 1 tablet by mouth every 6 (six) hours as needed for moderate pain. 12/24/15   Elson Areas, PA-C  levofloxacin (LEVAQUIN) 500 MG tablet  09/11/17   [provider]  levothyroxine (SYNTHROID, LEVOTHROID) 25 MCG tablet Take 25 mcg by mouth daily. 07/14/15   [provider]  linaclotide (LINZESS) 72 MCG capsule Take 72 mcg by mouth as needed.    [provider]  MELATONIN PO Take 1 tablet by mouth daily as needed (sleep).    [provider]  metFORMIN (GLUCOPHAGE) 500 MG tablet Take 1,000 mg by mouth 2 (two) times daily with a meal.     [provider]  metoprolol (TOPROL-XL) 50 MG 24 hr tablet Take 50 mg by mouth daily with lunch.     [provider]  pantoprazole (PROTONIX) 40 MG tablet TAKE (1) TABLET BY MOUTH TWICE DAILY BEFORE A MEAL. 10/05/17   Anice Paganini, NP  pioglitazone (ACTOS) 15 MG tablet Take 15 mg by mouth daily.    [provider]  predniSONE (DELTASONE) 5 MG tablet Take 1 tablet by mouth daily. 06/15/17   [provider]  rosuvastatin (CRESTOR) 40 MG tablet Take 1 tablet (40 mg total) by mouth at bedtime. 10/29/17 01/27/18  Pricilla Riffle, MD  temazepam (RESTORIL) 30 MG capsule Take 1 capsule by mouth at bedtime as needed. 06/19/17   [provider]  traZODone (DESYREL) 50 MG tablet Take 1 tablet by mouth at bedtime. 06/19/17   [provider]     Family History Family History  Problem Relation Age of Onset  . Colon cancer Paternal Aunt        2, colon cancer  . Colon cancer Paternal Uncle        6, colon cancer  . Colon cancer Father        deceased, age 67  . Colon cancer Unknown        numerous cousins died before age 43 with colon cancer  . Colon cancer Sister        deceased, age 73  . Anesthesia problems Neg Hx   . Hypotension Neg Hx   . Malignant hyperthermia Neg Hx   . Pseudochol deficiency Neg Hx     Social History Social  History   Tobacco Use  . Smoking status: Former Smoker    Packs/day: 0.25    Years: 15.00    Pack years: 3.75    Types: Cigarettes    Start date: 07/17/1969    Last attempt to quit: 07/18/1971    Years since quitting: 46.3  . Smokeless tobacco: Former Neurosurgeon    Types: Snuff    Quit date: 08/20/2014  . Tobacco comment: never more than 1-2 cig/day in entire life  Substance Use Topics  . Alcohol use: No    Alcohol/week: 0.0 oz  . Drug use: No     Allergies   Naproxen   Review of Systems Review of Systems  Unable to perform ROS: Other  Gastrointestinal: Positive for nausea.  Musculoskeletal: Positive for gait problem.  Allergic/Immunologic: Positive for immunocompromised state.       Diabetic  Neurological: Positive for weakness.   Unable to obtain full review of systems.  Poor historian  Physical Exam Updated Vital Signs BP (!) 119/54   Pulse 66   Temp 98.6 F (37 C) (Oral)   Resp 20   SpO2 97%   Physical Exam  Constitutional:  Chronically ill-appearing.  Mucous membranes dry  HENT:  Head: Normocephalic and atraumatic.  Eyes: Pupils are equal, round, and reactive to light. Conjunctivae are normal.  Neck: Neck supple. No tracheal deviation present. No thyromegaly present.  Cardiovascular: Normal rate and regular rhythm.  No murmur heard. Pulmonary/Chest: Effort normal and breath sounds normal.  Abdominal: Soft. Bowel sounds are normal. She exhibits no distension.  There is no tenderness.  Musculoskeletal: She exhibits no edema or tenderness.  Motor strength left lower extremity 1/5 all other extremities 5/5 DTRs symmetric bilaterally at knee jerk ankle jerk and biceps toes downgoing bilaterally  Neurological: She is alert. Coordination normal.  Skin: Skin is warm and dry. No rash noted.  Psychiatric: She has a normal mood and affect.  Nursing note and vitals reviewed.    ED Treatments / Results  Labs (all labs ordered are listed, but only abnormal results are displayed) Labs Reviewed  CBG MONITORING, ED - Abnormal; Notable for the following components:      Result Value   Glucose-Capillary 45 (*)    All other components within normal limits  I-STAT CHEM 8, ED - Abnormal; Notable for the following components:   BUN 33 (*)    Creatinine, Ser 1.50 (*)    All other components within normal limits  PROTIME-INR  APTT  DIFFERENTIAL  COMPREHENSIVE METABOLIC PANEL  CBC  RAPID URINE DRUG SCREEN, HOSP PERFORMED  URINALYSIS, ROUTINE W REFLEX MICROSCOPIC  I-STAT TROPONIN, ED    EKG EKG Interpretation  Date/Time:  Wednesday Nov 14 2017 13:51:23 EDT Ventricular Rate:  67 PR Interval:    QRS Duration: 104 QT Interval:  432 QTC Calculation: 457 R Axis:   84 Text Interpretation:  Sinus rhythm Short PR interval Borderline right axis deviation Low voltage, precordial leads Abnormal R-wave progression, early transition Baseline wander in lead(s) III Poor data quality Confirmed by Doug Sou [phone]) on 11/14/2017 2:02:54 PM  Poor quaity data  ED ECG REPORT   Date: 11/14/2017 214 pm  Rate: 65  Rhythm: normal sinus rhythm  QRS Axis: normal  Intervals: normal  ST/T Wave abnormalities: nonspecific T wave changes  Conduction Disutrbances:none  Narrative Interpretation: Tall R wave V2  Old EKG Reviewed: EKG from earlier today poor quality data.  I have personally reviewed the EKG tracing and agree with the computerized  printout as  noted. Radiology No results found.  Procedures Procedures (including critical care time)  Medications Ordered in ED Medications - No data to display Results for orders placed or performed during the hospital encounter of 11/14/17  Protime-INR  Result Value Ref Range   Prothrombin Time 16.2 (H) 11.4 - 15.2 seconds   INR 1.31   APTT  Result Value Ref Range   aPTT 41 (H) 24 - 36 seconds  Differential  Result Value Ref Range   Neutrophils Relative % 66 %   Neutro Abs 3.9 1.7 - 7.7 K/uL   Lymphocytes Relative 26 %   Lymphs Abs 1.6 0.7 - 4.0 K/uL   Monocytes Relative 7 %   Monocytes Absolute 0.4 0.1 - 1.0 K/uL   Eosinophils Relative 1 %   Eosinophils Absolute 0.1 0.0 - 0.7 K/uL   Basophils Relative 0 %   Basophils Absolute 0.0 0.0 - 0.1 K/uL  Comprehensive metabolic panel  Result Value Ref Range   Sodium 141 135 - 145 mmol/L   Potassium 4.1 3.5 - 5.1 mmol/L   Chloride 101 101 - 111 mmol/L   CO2 28 22 - 32 mmol/L   Glucose, Bld 77 65 - 99 mg/dL   BUN 37 (H) 6 - 20 mg/dL   Creatinine, Ser 9.60 (H) 0.44 - 1.00 mg/dL   Calcium 45.4 8.9 - 09.8 mg/dL   Total Protein 7.7 6.5 - 8.1 g/dL   Albumin 3.7 3.5 - 5.0 g/dL   AST 21 15 - 41 U/L   ALT 14 14 - 54 U/L   Alkaline Phosphatase 119 38 - 126 U/L   Total Bilirubin 0.5 0.3 - 1.2 mg/dL   GFR calc non Af Amer 33 (L) >60 mL/min   GFR calc Af Amer 38 (L) >60 mL/min   Anion gap 12 5 - 15  CBC  Result Value Ref Range   WBC 5.9 4.0 - 10.5 K/uL   RBC 3.79 (L) 3.87 - 5.11 MIL/uL   Hemoglobin 11.7 (L) 12.0 - 15.0 g/dL   HCT 11.9 14.7 - 82.9 %   MCV 95.0 78.0 - 100.0 fL   MCH 30.9 26.0 - 34.0 pg   MCHC 32.5 30.0 - 36.0 g/dL   RDW 56.2 13.0 - 86.5 %   Platelets 218 150 - 400 K/uL  Urine rapid drug screen (hosp performed)  Result Value Ref Range   Opiates POSITIVE (A) NONE DETECTED   Cocaine NONE DETECTED NONE DETECTED   Benzodiazepines POSITIVE (A) NONE DETECTED   Amphetamines NONE DETECTED NONE DETECTED   Tetrahydrocannabinol  NONE DETECTED NONE DETECTED   Barbiturates NONE DETECTED NONE DETECTED  Urinalysis, Routine w reflex microscopic  Result Value Ref Range   Color, Urine YELLOW YELLOW   APPearance CLEAR CLEAR   Specific Gravity, Urine 1.011 1.005 - 1.030   pH 5.0 5.0 - 8.0   Glucose, UA NEGATIVE NEGATIVE mg/dL   Hgb urine dipstick NEGATIVE NEGATIVE   Bilirubin Urine NEGATIVE NEGATIVE   Ketones, ur NEGATIVE NEGATIVE mg/dL   Protein, ur NEGATIVE NEGATIVE mg/dL   Nitrite NEGATIVE NEGATIVE   Leukocytes, UA NEGATIVE NEGATIVE  I-stat troponin, ED  Result Value Ref Range   Troponin i, poc 0.00 0.00 - 0.08 ng/mL   Comment 3          CBG monitoring, ED  Result Value Ref Range   Glucose-Capillary 45 (L) 65 - 99 mg/dL  I-Stat Chem 8, ED  Result Value Ref Range   Sodium 140 135 -  145 mmol/L   Potassium 4.2 3.5 - 5.1 mmol/L   Chloride 101 101 - 111 mmol/L   BUN 33 (H) 6 - 20 mg/dL   Creatinine, Ser 1.61 (H) 0.44 - 1.00 mg/dL   Glucose, Bld 74 65 - 99 mg/dL   Calcium, Ion 0.96 0.45 - 1.40 mmol/L   TCO2 27 22 - 32 mmol/L   Hemoglobin 12.2 12.0 - 15.0 g/dL   HCT 40.9 81.1 - 91.4 %  CBG monitoring, ED  Result Value Ref Range   Glucose-Capillary 103 (H) 65 - 99 mg/dL  CBG monitoring, ED  Result Value Ref Range   Glucose-Capillary 141 (H) 65 - 99 mg/dL   Ct Head Wo Contrast  Result Date: 11/14/2017 CLINICAL DATA:  67 year old female with a history of prior stroke. Left-sided weakness. EXAM: CT HEAD WITHOUT CONTRAST TECHNIQUE: Contiguous axial images were obtained from the base of the skull through the vertex without intravenous contrast. COMPARISON:  None. FINDINGS: Brain: No acute intracranial hemorrhage. No midline shift or mass effect. Redemonstration of encephalomalacia of the right temporal region, frontal region, and the anterior limb of the internal capsule. Associated asymmetry of the cerebral peduncle, suggesting wallerian degeneration. These are unchanged from the prior. Gray-white differentiation is  maintained on the current. Mild patchy hypodensity in the periventricular white matter. Vascular: Calcifications of the anterior circulation Skull: No acute fracture. Lucent regions of the occipital bone are unchanged dating to 2017. Sinuses/Orbits: No paranasal sinus disease. Unremarkable appearance of the orbits. Other: None IMPRESSION: Head CT negative for acute intracranial abnormality. Redemonstration of encephalomalacia of the right frontotemporal region as well and the associated anterior limb of the internal capsule. Electronically Signed   By: Gilmer Mor D.O.   On: 11/14/2017 15:28    Initial Impression / Assessment and Plan / ED Course  I have reviewed the triage vital signs and the nursing notes.  Pertinent labs & imaging results that were available during my care of the patient were reviewed by me and considered in my medical decision making (see chart for details).     CBG consistent with hypoglycemia.  Intravenous D50 ordered and iv thiamine ordered..  Her lab work consistent with renal insufficiency, acute on chronic At 3:45 PM patient remains somnolent, arousable to gentle tactile stimulus.  Motor strength in left lower extremity remains at 1/5.  She moves all extremities well. She did pass the stroke screen.  NIH stroke scale equals 5.  I consulted Dr. Onalee Hua who will arrange for overnight stay.  She requested MRI scan of the brain which I have ordered.  Dr. Onalee Hua arrange for overnight stay Final Clinical Impressions(s) / ED Diagnoses  Diagnosis #1 weakness #2 hypoglycemia Final diagnoses:  None  #3 renal insufficiency  ED Discharge Orders    None       Doug Sou, MD 11/14/17 1658

## 2017-11-14 NOTE — ED Notes (Signed)
Per MD Onalee Hua pt can have increased CBG monitoring time and to feed pt at this time. 300 Charge RN notified of MD's decision and approved.

## 2017-11-14 NOTE — ED Notes (Signed)
Neuro assessment left side weakness and facial droop.  Per pt and husband this is from an old stroke.  Per husband her today because she keeps falling and loosing her balance.  Also c/o vomiting.

## 2017-11-14 NOTE — ED Triage Notes (Signed)
Pt had stroke February. States started having weakness on left side 2 days ago. States couldn't talk right. Pt has some slurred speech, leaning to left side and mouth drooping to left side. Husband states speech is worse in last 2 days. A/o.

## 2017-11-14 NOTE — ED Notes (Signed)
Pt resting with eyes shut .  Arouses when call name loudly.  Per husband pt did not rest good last night.  She fell in the floor and he could not get her up,  So she slept in the floor all night.

## 2017-11-14 NOTE — H&P (Signed)
History and Physical    [name] ZOX:[id number] DOB: 23-Sep-1950 DOA: 11/14/2017  PCP: [name], MD  Patient coming from: Home  Chief Complaint: Weakness  HPI: Megan [name] is a 67 y.o. female with medical history significant of previous CVA, chronic pain syndrome on multiple controlled substances, depression, GERD, chronically has left-sided weakness from previous stroke is brought in because of worsened left-sided weakness and altered mental status.  Patient seems overly sedated and somnolent.  Emergency room is concerned she is had another stroke.  She is moving all extremities randomly however.  She says she took some extra pain pills this morning.  She denies any fevers.  She denies any nausea vomiting or diarrhea.  She went to see her primary care physician today and was referred to the emergency department for further work-up.  She has had frequent falls also.  Patient referred for admission for possible new stroke and inability to walk.   Review of Systems: As per HPI otherwise 10 point review of systems negative.  But unreliable  Past Medical History:  Diagnosis Date  . Agoraphobia with panic disorder   . Anxiety   . Arthritis   . Chronic pain 03/08/15   Hydrocodone-APAP 10/325 mg, # 120 q month  . Colon polyps   . Complication of anesthesia   . Depression   . DM (diabetes mellitus) (HCC)   . GERD (gastroesophageal reflux disease)   . HTN (hypertension)   . Hypothyroidism   . Obesity   . PONV (postoperative nausea and vomiting)   . Stroke (HCC) 08/20/2014   left sided weakness; some memory loss.    Past Surgical History:  Procedure Laterality Date  . ABDOMINAL HYSTERECTOMY  15 yrs ago   with bso at aph  . CARDIAC CATHETERIZATION  2004   normal  . CHOLECYSTECTOMY  25 yrs ago   [facility]  . COLONOSCOPY  05/04/11   left-sided colonic diverticulosis  . COLONOSCOPY N/A 01/27/2016   diverticulosis in sigmoid colon and descending colon, active bleeding  Dieulafoy lesion s/p clips X 3, next colonoscopy in 5 years due to family history of colon cancer    . ESOPHAGOGASTRODUODENOSCOPY  05/04/2011   Abnormal distal esophagus, biopsies consistent with inflammation due to acid reflux. No evidence of Barrett's. No H. pylori  . ESOPHAGOGASTRODUODENOSCOPY N/A 01/22/2016   erosive gastropathy, non-bleeding gastric ulcers and 1 oozing gastric ulcer with adherent clot s/p heater probe. Negative H.pylori serology.   . ESOPHAGOGASTRODUODENOSCOPY (EGD) WITH PROPOFOL N/A 04/27/2016   Dr. Jena Gauss: healed ulcers. normal esophagus  . RADIOLOGY WITH ANESTHESIA N/A 08/20/2015   Procedure: RADIOLOGY WITH ANESTHESIA;  Surgeon: Julieanne Cotton, MD;  Location: [facility] OR;  Service: Radiology;  Laterality: N/A;     reports that she quit smoking about 46 years ago. Her smoking use included cigarettes. She started smoking about 48 years ago. She has a 3.75 pack-year smoking history. She quit smokeless tobacco use about 3 years ago. Her smokeless tobacco use included snuff. She reports that she does not drink alcohol or use drugs.  Allergies  Allergen Reactions  . Naproxen Other (See Comments)    Causes stomach burning    Family History  Problem Relation Age of Onset  . Colon cancer Paternal Aunt        2, colon cancer  . Colon cancer Paternal Uncle        6, colon cancer  . Colon cancer Father        deceased, age 37  .  Colon cancer Unknown        numerous cousins died before age 23 with colon cancer  . Colon cancer Sister        deceased, age 48  . Anesthesia problems Neg Hx   . Hypotension Neg Hx   . Malignant hyperthermia Neg Hx   . Pseudochol deficiency Neg Hx     Prior to Admission medications   Medication Sig Start Date End Date Taking? Authorizing Provider  apixaban (ELIQUIS) 5 MG TABS tablet Take 1 tablet (5 mg total) by mouth 2 (two) times daily. Restart on 7/14 01/27/16   Erick Blinks, MD  Cholecalciferol (VITAMIN D-3 PO) Take 1 tablet by mouth daily.     [provider]  diazepam (VALIUM) 10 MG tablet Take 1 tablet by mouth at bedtime as needed for sleep.  12/10/15   [provider]  enalapril (VASOTEC) 20 MG tablet Take 20 mg by mouth 2 (two) times daily.     [provider]  escitalopram (LEXAPRO) 10 MG tablet Take 1 tablet by mouth daily. 05/23/17   [provider]  furosemide (LASIX) 40 MG tablet Take 1 tablet by mouth as needed. 04/24/17   [provider]  glimepiride (AMARYL) 2 MG tablet Take 2 mg by mouth daily with breakfast.    [provider]  glipiZIDE (GLUCOTROL) 10 MG tablet Take 10 mg by mouth every morning. In the AM    [provider]  hydrochlorothiazide (HYDRODIURIL) 25 MG tablet Take 25 mg by mouth daily.      [provider]  HYDROcodone-acetaminophen (NORCO) 10-325 MG tablet Take 1 tablet by mouth every 6 (six) hours as needed for moderate pain. 12/24/15   Elson Areas, PA-C  levofloxacin (LEVAQUIN) 500 MG tablet  09/11/17   [provider]  levothyroxine (SYNTHROID, LEVOTHROID) 25 MCG tablet Take 25 mcg by mouth daily. 07/14/15   [provider]  linaclotide (LINZESS) 72 MCG capsule Take 72 mcg by mouth as needed.    [provider]  MELATONIN PO Take 1 tablet by mouth daily as needed (sleep).    [provider]  metFORMIN (GLUCOPHAGE) 500 MG tablet Take 1,000 mg by mouth 2 (two) times daily with a meal.     [provider]  metoprolol (TOPROL-XL) 50 MG 24 hr tablet Take 50 mg by mouth daily with lunch.     [provider]  pantoprazole (PROTONIX) 40 MG tablet TAKE (1) TABLET BY MOUTH TWICE DAILY BEFORE A MEAL. 10/05/17   Anice Paganini, NP  pioglitazone (ACTOS) 15 MG tablet Take 15 mg by mouth daily.    [provider]  predniSONE (DELTASONE) 5 MG tablet Take 1 tablet by mouth daily. 06/15/17   [provider]  rosuvastatin (CRESTOR) 40 MG tablet Take 1 tablet (40 mg total) by mouth at  bedtime. 10/29/17 01/27/18  Pricilla Riffle, MD  temazepam (RESTORIL) 30 MG capsule Take 1 capsule by mouth at bedtime as needed. 06/19/17   [provider]  traZODone (DESYREL) 50 MG tablet Take 1 tablet by mouth at bedtime. 06/19/17   [provider]    Physical Exam: Vitals:   11/14/17 1400 11/14/17 1430 11/14/17 1530 11/14/17 1600  BP: (!) 113/49 (!) 109/56 (!) 100/50 (!) 113/52  Pulse: 64 69 67   Resp: Temp:      TempSrc:      SpO2: 97% 100% 93%       Constitutional: NAD,  calm, comfortable and somnolent moving all extremities randomly Vitals:   11/14/17 1400 11/14/17 1430 11/14/17 1530 11/14/17 1600  BP: (!) 113/49 (!) 109/56 (!) 100/50 (!) 113/52  Pulse: 64 69 67   Resp: Temp:      TempSrc:      SpO2: 97% 100% 93%    Eyes: PERRL, lids and conjunctivae normal ENMT: Mucous membranes are moist. Posterior pharynx clear of any exudate or lesions.Normal dentition.  Neck: normal, supple, no masses, no thyromegaly Respiratory: clear to auscultation bilaterally, no wheezing, no crackles. Normal respiratory effort. No accessory muscle use.  Cardiovascular: Regular rate and rhythm, no murmurs / rubs / gallops. No extremity edema. 2+ pedal pulses. No carotid bruits.  Abdomen: no tenderness, no masses palpated. No hepatosplenomegaly. Bowel sounds positive.  Musculoskeletal: no clubbing / cyanosis. No joint deformity upper and lower extremities. Good ROM, no contractures. Normal muscle tone.  Skin: no rashes, lesions, ulcers. No induration Neurologic: CN 2-12 grossly intact. Sensation intact, DTR normal. Strength 5/5  on the right decreased in the left lower extremity.  Psychiatric: Not normal judgment and insight.  Very slow to respond alert and oriented x 2. Normal mood.    Labs on Admission: I have personally reviewed following labs and imaging studies  CBC: Recent Labs  Lab 11/14/17 1357 11/14/17 1401  WBC 5.9  --   NEUTROABS 3.9  --     HGB 11.7* 12.2  HCT 36.0 36.0  MCV 95.0  --   PLT 218  --    Basic Metabolic Panel: Recent Labs  Lab 11/14/17 1357 11/14/17 1401  NA 141 140  K 4.1 4.2  CL 101 101  CO2 28  --   GLUCOSE 77 74  BUN 37* 33*  CREATININE 1.58* 1.50*  CALCIUM 10.1  --    GFR: CrCl cannot be calculated (Unknown ideal weight.). Liver Function Tests: Recent Labs  Lab 11/14/17 1357  AST 21  ALT 14  ALKPHOS 119  BILITOT 0.5  PROT 7.7  ALBUMIN 3.7   No results for input(s): LIPASE, AMYLASE in the last 168 hours. No results for input(s): AMMONIA in the last 168 hours. Coagulation Profile: Recent Labs  Lab 11/14/17 1357  INR 1.31   Cardiac Enzymes: No results for input(s): CKTOTAL, CKMB, CKMBINDEX, TROPONINI in the last 168 hours. BNP (last 3 results) No results for input(s): PROBNP in the last 8760 hours. HbA1C: No results for input(s): HGBA1C in the last 72 hours. CBG: Recent Labs  Lab 11/14/17 1417 11/14/17 1443 11/14/17 1527  GLUCAP 45* 103* 141*   Lipid Profile: No results for input(s): CHOL, HDL, LDLCALC, TRIG, CHOLHDL, LDLDIRECT in the last 72 hours. Thyroid Function Tests: No results for input(s): TSH, T4TOTAL, FREET4, T3FREE, THYROIDAB in the last 72 hours. Anemia Panel: No results for input(s): VITAMINB12, FOLATE, FERRITIN, TIBC, IRON, RETICCTPCT in the last 72 hours. Urine analysis:    Component Value Date/Time   COLORURINE YELLOW 11/14/2017 1435   APPEARANCEUR CLEAR 11/14/2017 1435   LABSPEC 1.011 11/14/2017 1435   PHURINE 5.0 11/14/2017 1435   GLUCOSEU NEGATIVE 11/14/2017 1435   HGBUR NEGATIVE 11/14/2017 1435   BILIRUBINUR NEGATIVE 11/14/2017 1435   KETONESUR NEGATIVE 11/14/2017 1435   PROTEINUR NEGATIVE 11/14/2017 1435   UROBILINOGEN 0.2 03/08/2015 0740   NITRITE NEGATIVE 11/14/2017 1435   LEUKOCYTESUR NEGATIVE 11/14/2017 1435   Sepsis Labs: !!!!!!!!!!!!!!!!!!!!!!!!!!!!!!!!!!!!!!!!!!!! (procalcitonin:4,lacticidven:4) )No results found for this  or any previous visit (from the past 240 hour(s)).   Radiological  Exams on Admission: Ct Head Wo Contrast  Result Date: 11/14/2017 CLINICAL DATA:  67 year old female with a history of prior stroke. Left-sided weakness. EXAM: CT HEAD WITHOUT CONTRAST TECHNIQUE: Contiguous axial images were obtained from the base of the skull through the vertex without intravenous contrast. COMPARISON:  None. FINDINGS: Brain: No acute intracranial hemorrhage. No midline shift or mass effect. Redemonstration of encephalomalacia of the right temporal region, frontal region, and the anterior limb of the internal capsule. Associated asymmetry of the cerebral peduncle, suggesting wallerian degeneration. These are unchanged from the prior. Gray-white differentiation is maintained on the current. Mild patchy hypodensity in the periventricular white matter. Vascular: Calcifications of the anterior circulation Skull: No acute fracture. Lucent regions of the occipital bone are unchanged dating to 2017. Sinuses/Orbits: No paranasal sinus disease. Unremarkable appearance of the orbits. Other: None IMPRESSION: Head CT negative for acute intracranial abnormality. Redemonstration of encephalomalacia of the right frontotemporal region as well and the associated anterior limb of the internal capsule. Electronically Signed   By: Gilmer Mor D.O.   On: 11/14/2017 15:28    EKG: Independently reviewed.  Normal sinus rhythm without any acute changes  Old chart reviewed  Case discussed with EDP  Assessment/Plan 67 year old female with a history of CVA comes in with encephalopathy weakness of unclear etiology Principal Problem:   Jonette Pesa this is due to polypharmacy.  It was reported patient cannot move her left leg but this is not the case.  Patient is moving her extremities randomly and spontaneously.  She is able to follow simple instructions.  Patient's MRI of her brain is pending.  Her urine drug screen however is positive for  both opiates and benzos.  She seems very somnolent.  We will hold all sedatives at this time.  Obtain physical therapy evaluation.  If MRI does not indeed show another stroke would not do further stroke work-up.  However if it is positive then proceed with further stroke work-up.  Patient is also hypoglycemic which could also be a contributing factor.   Active Problems:   Hypoglycemia-check glucose every 2 hours and hold all diabetic medications.  This can also be contributing to her overall above status.  She did get an amp of D50 in the emergency department and she did not have any resolution of her mental status however.    Acute metabolic encephalopathy-likely multifactorial as above    H/O: CVA (cerebrovascular accident)-as above continue aspirin    Chronic pain-holding all sedatives at this time due to the above issues    DM type 2 (diabetes mellitus, type 2) (HCC)-holding diabetic medications in the setting of hypoglycemia    Benign essential HTN-continue home meds    DVT prophylaxis: SCDs Code Status: Full Family Communication: None Disposition Plan: Per day team Consults called: None Admission status: Observation   Teryl Gubler A MD [facility]  If 7PM-7AM, please contact night-coverage [email or web address] Password [facility]  11/14/2017, 5:21 PM

## 2017-11-14 NOTE — ED Notes (Signed)
Report called to 300 RN at this time.

## 2017-11-15 ENCOUNTER — Other Ambulatory Visit: Payer: Self-pay

## 2017-11-15 DIAGNOSIS — E118 Type 2 diabetes mellitus with unspecified complications: Secondary | ICD-10-CM | POA: Diagnosis not present

## 2017-11-15 DIAGNOSIS — G9341 Metabolic encephalopathy: Secondary | ICD-10-CM | POA: Diagnosis not present

## 2017-11-15 DIAGNOSIS — I10 Essential (primary) hypertension: Secondary | ICD-10-CM | POA: Diagnosis not present

## 2017-11-15 DIAGNOSIS — R531 Weakness: Secondary | ICD-10-CM | POA: Diagnosis not present

## 2017-11-15 DIAGNOSIS — Z8673 Personal history of transient ischemic attack (TIA), and cerebral infarction without residual deficits: Secondary | ICD-10-CM

## 2017-11-15 DIAGNOSIS — E162 Hypoglycemia, unspecified: Secondary | ICD-10-CM | POA: Diagnosis not present

## 2017-11-15 DIAGNOSIS — I482 Chronic atrial fibrillation: Secondary | ICD-10-CM | POA: Diagnosis not present

## 2017-11-15 DIAGNOSIS — G894 Chronic pain syndrome: Secondary | ICD-10-CM | POA: Diagnosis not present

## 2017-11-15 LAB — BASIC METABOLIC PANEL
Anion gap: 13 (ref 5–15)
BUN: 25 mg/dL — ABNORMAL HIGH (ref 6–20)
CALCIUM: 10.3 mg/dL (ref 8.9–10.3)
CHLORIDE: 101 mmol/L (ref 101–111)
CO2: 26 mmol/L (ref 22–32)
CREATININE: 1.11 mg/dL — AB (ref 0.44–1.00)
GFR calc non Af Amer: 50 mL/min — ABNORMAL LOW (ref >60)
GFR, EST AFRICAN AMERICAN: 58 mL/min — AB (ref >60)
GLUCOSE: 196 mg/dL — AB (ref 65–99)
Potassium: 4.1 mmol/L (ref 3.5–5.1)
Sodium: 140 mmol/L (ref 135–145)

## 2017-11-15 LAB — BLOOD GAS, ARTERIAL
ACID-BASE EXCESS: 5.8 mmol/L — AB (ref 0.0–2.0)
Bicarbonate: 29.4 mmol/L — ABNORMAL HIGH (ref 20.0–28.0)
DRAWN BY: 21310
O2 SAT: 94.5 %
PATIENT TEMPERATURE: 37
PCO2 ART: 42.1 mmHg (ref 32.0–48.0)
PO2 ART: 71.8 mmHg — AB (ref 83.0–108.0)
pH, Arterial: 7.462 — ABNORMAL HIGH (ref 7.350–7.450)

## 2017-11-15 LAB — CBC
HCT: 39.9 % (ref 36.0–46.0)
HEMOGLOBIN: 13 g/dL (ref 12.0–15.0)
MCH: 30.8 pg (ref 26.0–34.0)
MCHC: 32.6 g/dL (ref 30.0–36.0)
MCV: 94.5 fL (ref 78.0–100.0)
Platelets: 235 10*3/uL (ref 150–400)
RBC: 4.22 MIL/uL (ref 3.87–5.11)
RDW: 12.9 % (ref 11.5–15.5)
WBC: 5.5 10*3/uL (ref 4.0–10.5)

## 2017-11-15 LAB — GLUCOSE, CAPILLARY
GLUCOSE-CAPILLARY: 256 mg/dL — AB (ref 65–99)
GLUCOSE-CAPILLARY: 254 mg/dL — AB (ref 65–99)
GLUCOSE-CAPILLARY: 193 mg/dL — AB (ref 65–99)
Glucose-Capillary: 184 mg/dL — ABNORMAL HIGH (ref 65–99)
Glucose-Capillary: 195 mg/dL — ABNORMAL HIGH (ref 65–99)

## 2017-11-15 LAB — AMMONIA: Ammonia: 24 umol/L (ref 9–35)

## 2017-11-15 MED ORDER — INSULIN ASPART 100 UNIT/ML ~~LOC~~ SOLN: 0.0000 [IU] | Freq: Every day | SUBCUTANEOUS | Status: DC

## 2017-11-15 MED ORDER — INSULIN ASPART 100 UNIT/ML ~~LOC~~ SOLN
0.0000 [IU] | Freq: Three times a day (TID) | SUBCUTANEOUS | Status: DC
  Administered 2017-11-15: 8 [IU] via SUBCUTANEOUS
  Administered 2017-11-16: 5 [IU] via SUBCUTANEOUS
  Administered 2017-11-16: 3 [IU] via SUBCUTANEOUS
  Administered 2017-11-16: 2 [IU] via SUBCUTANEOUS
  Administered 2017-11-17: 3 [IU] via SUBCUTANEOUS
  Administered 2017-11-17 – 2017-11-19 (×4): 2 [IU] via SUBCUTANEOUS

## 2017-11-15 MED ORDER — SODIUM CHLORIDE 0.9 % IV SOLN
INTRAVENOUS | Status: DC
  Administered 2017-11-15 – 2017-11-18 (×6): via INTRAVENOUS

## 2017-11-15 NOTE — Plan of Care (Signed)
  Problem: Acute Rehab PT Goals(only PT should resolve) Goal: Pt Will Go Supine/Side To Sit Outcome: Progressing Flowsheets (Taken 11/15/2017 0912) Pt will go Supine/Side to Sit: with moderate assist Goal: Patient Will Transfer Sit To/From Stand Outcome: Progressing Flowsheets (Taken 11/15/2017 0912) Patient will transfer sit to/from stand: with moderate assist Goal: Pt Will Transfer Bed To Chair/Chair To Bed Outcome: Progressing Flowsheets (Taken 11/15/2017 0912) Pt will Transfer Bed to Chair/Chair to Bed: with mod assist Goal: Pt Will Ambulate Outcome: Progressing Flowsheets (Taken 11/15/2017 0912) Pt will Ambulate: 15 feet;with moderate assist;with rolling walker  9:12 AM, 11/15/17 Ocie Bob, MPT Physical Therapist with [facility] [phone] office [phone] mobile phone

## 2017-11-15 NOTE — Progress Notes (Signed)
Late entry  Attempted placement of telemetry box, patient continues to pull it off, telemetry removed.

## 2017-11-15 NOTE — Evaluation (Signed)
Physical Therapy Evaluation Patient Details Name: Megan [name] MRN: [id number] DOB: 21-Oct-1950 Today's Date: 11/15/2017   History of Present Illness  Megan [name] is a 67 y.o. female with medical history significant of previous CVA, chronic pain syndrome on multiple controlled substances, depression, GERD, chronically has left-sided weakness from previous stroke is brought in because of worsened left-sided weakness and altered mental status.  Patient seems overly sedated and somnolent.  Emergency room is concerned she is had another stroke.  She is moving all extremities randomly however.  She says she took some extra pain pills this morning.  She denies any fevers.  She denies any nausea vomiting or diarrhea.  She went to see her primary care physician today and was referred to the emergency department for further work-up.  She has had frequent falls also.  Patient referred for admission for possible new stroke and inability to walk.    Clinical Impression  Patient presents lethargic, appears confused, and limited for participation with therapy.  Patient cooperated with sitting up at bedside demonstrating poor sitting balance with falling backwards upon sitting, unable to keep trunk in midline or attempt sit to stands/transfers due to fall risk and poor carryover.  Patient put back to bed with side rails up, bed alarm on, and floor mats in place.  Patient will benefit from continued physical therapy in hospital and recommended venue below to increase strength, balance, endurance for safe ADLs and gait.    Follow Up Recommendations SNF    Equipment Recommendations  None recommended by PT(to be determined)    Recommendations for Other Services       Precautions / Restrictions Precautions Precautions: Fall Restrictions Weight Bearing Restrictions: No      Mobility  Bed Mobility Overal bed mobility: Needs Assistance Bed Mobility: Supine to Sit;Sit to Supine     Supine to  sit: Max assist Sit to supine: Max assist   General bed mobility comments: slow labored movement with frequent falling backwards once seated  Transfers                    Ambulation/Gait                Stairs            Wheelchair Mobility    Modified Rankin (Stroke Patients Only)       Balance Overall balance assessment: Needs assistance Sitting-balance support: Feet supported;No upper extremity supported Sitting balance-Leahy Scale: Poor Sitting balance - Comments: falls backwards Postural control: Posterior lean                                   Pertinent Vitals/Pain Pain Assessment: No/denies pain    Home Living Family/patient expects to be discharged to:: Private residence Living Arrangements: Spouse/significant other Available Help at Discharge: Family Type of Home: Mobile home Home Access: Stairs to enter Entrance Stairs-Rails: Right;Left;Can reach both Entrance Stairs-Number of Steps: 6 Home Layout: One level Home Equipment: Cane - single point;Bedside commode Additional Comments: patient is poor historian, information taken from previous admission    Prior Function           Comments: patient is poor historian     Hand Dominance   Dominant Hand: Right    Extremity/Trunk Assessment   Upper Extremity Assessment Upper Extremity Assessment: Generalized weakness;RUE deficits/detail;LUE deficits/detail RUE Deficits / Details: grossly 3+/5 LUE Deficits / Details: grossly 2+/5  Lower Extremity Assessment Lower Extremity Assessment: Generalized weakness;RLE deficits/detail;LLE deficits/detail RLE Deficits / Details: grossly 3+/5 LLE Deficits / Details: grossly -3/5, except ankle dorsiflexion 2/5    Cervical / Trunk Assessment Cervical / Trunk Assessment: Normal  Communication   Communication: Other (comment)(patient very lethargic)  Cognition Arousal/Alertness: Lethargic;Awake/alert Behavior During Therapy:  Impulsive Overall Cognitive Status: No family/caregiver present to determine baseline cognitive functioning                                 General Comments: patient very lethargic unable to keep eyes open, followed instructions 50% of time      General Comments      Exercises     Assessment/Plan    PT Assessment Patient needs continued PT services  PT Problem List Decreased strength;Decreased activity tolerance;Decreased balance;Decreased mobility       PT Treatment Interventions Gait training;Functional mobility training;Therapeutic activities;Therapeutic exercise;Stair training;Patient/family education    PT Goals (Current goals can be found in the Care Plan section)  Acute Rehab PT Goals Patient Stated Goal: return home PT Goal Formulation: With patient Time For Goal Achievement: 11/29/17 Potential to Achieve Goals: Fair    Frequency Min 3X/week   Barriers to discharge        Co-evaluation               AM-PAC PT "6 Clicks" Daily Activity  Outcome Measure Difficulty turning over in bed (including adjusting bedclothes, sheets and blankets)?: A Lot Difficulty moving from lying on back to sitting on the side of the bed? : A Lot Difficulty sitting down on and standing up from a chair with arms (e.g., wheelchair, bedside commode, etc,.)?: Unable Help needed moving to and from a bed to chair (including a wheelchair)?: Total Help needed walking in hospital room?: Total Help needed climbing 3-5 steps with a railing? : Total 6 Click Score: 8    End of Session   Activity Tolerance: Patient limited by lethargy;Patient limited by fatigue Patient left: in bed;with call bell/phone within reach;with bed alarm set;Other (comment)(floor matts in place) Nurse Communication: Mobility status PT Visit Diagnosis: Unsteadiness on feet (R26.81);Other abnormalities of gait and mobility (R26.89);Muscle weakness (generalized) (M62.81)    Time: 4540-9811 PT Time  Calculation (min) (ACUTE ONLY): 21 min   Charges:   PT Evaluation $PT Eval Moderate Complexity: 1 Mod PT Treatments $Therapeutic Activity: 8-22 mins   PT G Codes:        9:10 AM, 2017-12-05 Ocie Bob, MPT Physical Therapist with [facility] 336 [phone] office [phone] mobile phone

## 2017-11-15 NOTE — Progress Notes (Signed)
PROGRESS NOTE    Megan [name]  WUJ:[id number] DOB: 1950-12-12 DOA: 11/14/2017 PCP: [name], MD    Brief Narrative:  67 year old female admitted to the hospital with worsening weakness and lethargy.  On multiple sedative medications.  Was also noted to be hypoglycemic and elevated creatinine.  She said poor p.o. intake for several days now.  Started on IV fluids.  Imaging of brain is unrevealing.  No obvious infection.  Suspect polypharmacy is because of her change in mental status.  Seen by physical therapy recommended skilled nursing facility placement.   Assessment & Plan:   Principal Problem:   Weakness Active Problems:   Hypoglycemia   H/O: CVA (cerebrovascular accident)   Chronic pain   DM type 2 (diabetes mellitus, type 2) (HCC)   Benign essential HTN   Acute metabolic encephalopathy   1. Acute encephalopathy.  Imaging of brain including CT and MRI unrevealing.  Urinalysis does not show any signs of infection.  She is on multiple sedative medications which are likely playing a role.  Continue to monitor.  Will check ABG and ammonia.  If this is medication related, anticipate further improvement by a.m. 2. Diabetes.  Patient was having hypoglycemic episodes on arrival, which was likely contributing to #1.  Currently on dextrose infusion.  Start sliding scale insulin.  Change dextrose to saline.  Blood sugars are running high at this time. 3. Hypothyroidism.  Continue on Synthroid 4. Chronic pain syndrome.  Medications currently on hold. 5. History of CVA in the past.  She has residual left-sided weakness.  Physical therapy has evaluated the patient recommend skilled nursing facility placement. 6. AKI.  Likely related to poor p.o. intake.  Improving with IV fluids.   DVT prophylaxis: eliquis Code Status: Full code Family Communication: Discussed with husband at the bedside Disposition Plan: Skilled nursing facility placement   Consultants:     Procedures:       Antimicrobials:      Subjective: Somnolent, does not answer questions.  Objective: Vitals:   11/14/17 1948 11/14/17 2145 11/15/17 0547 11/15/17 1352  BP: 108/77 (!) 120/42 (!) 148/82 (!) 144/98  Pulse: 80 86 96 90  Resp: 20 20  (!) 23  Temp:  98.6 F (37 C) 97.8 F (36.6 C)   TempSrc:  Oral Oral   SpO2: 99% 95% 100% 100%  Weight:  96.3 kg (212 lb 4.9 oz) 96.5 kg (212 lb 11.9 oz)     Intake/Output Summary (Last 24 hours) at 11/15/2017 1843 Last data filed at 11/15/2017 1700 Gross per 24 hour  Intake 302.5 ml  Output 2450 ml  Net -2147.5 ml   Filed Weights   11/14/17 2145 11/15/17 0547  Weight: 96.3 kg (212 lb 4.9 oz) 96.5 kg (212 lb 11.9 oz)    Examination:  General exam: Appears calm and comfortable  Respiratory system: Diminished breath sounds bilaterally.  Respiratory effort normal. Cardiovascular system: S1 & S2 heard, RRR. No JVD, murmurs, rubs, gallops or clicks.  Gastrointestinal system: Abdomen is nondistended, soft and nontender. No organomegaly or masses felt. Normal bowel sounds heard. Central nervous system: No focal neurological deficits. Extremities: No Edema bilaterally Skin: No rashes, lesions or ulcers Psychiatry: Lethargic, does not answer questions    Data Reviewed: I have personally reviewed following labs and imaging studies  CBC: Recent Labs  Lab 11/14/17 1357 11/14/17 1401 11/15/17 0436  WBC 5.9  --  5.5  NEUTROABS 3.9  --   --   HGB 11.7* 12.2 13.0  HCT 36.0 36.0 39.9  MCV 95.0  --  94.5  PLT 218  --  235   Basic Metabolic Panel: Recent Labs  Lab 11/14/17 1357 11/14/17 1401 11/15/17 0436  NA 141 140 140  K 4.1 4.2 4.1  CL 101 101 101  CO2 28  --  26  GLUCOSE 77 74 196*  BUN 37* 33* 25*  CREATININE 1.58* 1.50* 1.11*  CALCIUM 10.1  --  10.3   GFR: Estimated Creatinine Clearance: 53.3 mL/min (A) (by C-G formula based on SCr of 1.11 mg/dL (H)). Liver Function Tests: Recent Labs  Lab 11/14/17 1357  AST 21  ALT  14  ALKPHOS 119  BILITOT 0.5  PROT 7.7  ALBUMIN 3.7   No results for input(s): LIPASE, AMYLASE in the last 168 hours. No results for input(s): AMMONIA in the last 168 hours. Coagulation Profile: Recent Labs  Lab 11/14/17 1357  INR 1.31   Cardiac Enzymes: No results for input(s): CKTOTAL, CKMB, CKMBINDEX, TROPONINI in the last 168 hours. BNP (last 3 results) No results for input(s): PROBNP in the last 8760 hours. HbA1C: No results for input(s): HGBA1C in the last 72 hours. CBG: Recent Labs  Lab 11/14/17 2357 11/15/17 0436 11/15/17 0752 11/15/17 1038 11/15/17 1611  GLUCAP 96 184* 195* 256* 254*   Lipid Profile: No results for input(s): CHOL, HDL, LDLCALC, TRIG, CHOLHDL, LDLDIRECT in the last 72 hours. Thyroid Function Tests: No results for input(s): TSH, T4TOTAL, FREET4, T3FREE, THYROIDAB in the last 72 hours. Anemia Panel: No results for input(s): VITAMINB12, FOLATE, FERRITIN, TIBC, IRON, RETICCTPCT in the last 72 hours. Sepsis Labs: No results for input(s): PROCALCITON, LATICACIDVEN in the last 168 hours.  No results found for this or any previous visit (from the past 240 hour(s)).       Radiology Studies: Ct Head Wo Contrast  Result Date: 11/14/2017 CLINICAL DATA:  67 year old female with a history of prior stroke. Left-sided weakness. EXAM: CT HEAD WITHOUT CONTRAST TECHNIQUE: Contiguous axial images were obtained from the base of the skull through the vertex without intravenous contrast. COMPARISON:  None. FINDINGS: Brain: No acute intracranial hemorrhage. No midline shift or mass effect. Redemonstration of encephalomalacia of the right temporal region, frontal region, and the anterior limb of the internal capsule. Associated asymmetry of the cerebral peduncle, suggesting wallerian degeneration. These are unchanged from the prior. Gray-white differentiation is maintained on the current. Mild patchy hypodensity in the periventricular white matter. Vascular:  Calcifications of the anterior circulation Skull: No acute fracture. Lucent regions of the occipital bone are unchanged dating to 2017. Sinuses/Orbits: No paranasal sinus disease. Unremarkable appearance of the orbits. Other: None IMPRESSION: Head CT negative for acute intracranial abnormality. Redemonstration of encephalomalacia of the right frontotemporal region as well and the associated anterior limb of the internal capsule. Electronically Signed   By: Gilmer Mor D.O.   On: 11/14/2017 15:28   Mr Brain Wo Contrast  Result Date: 11/14/2017 CLINICAL DATA:  67 y/o  F; 2 days of left-sided weakness. EXAM: MRI HEAD WITHOUT CONTRAST TECHNIQUE: Axial DWI, coronal DWI, sagittal T1, axial T2, axial T2 FLAIR sequences were acquired. Patient unable to hold still. COMPARISON:  11/14/2017 CT head.  09/28/2015 MRI head. FINDINGS: Brain: No acute infarction, hemorrhage, hydrocephalus, extra-axial collection or mass lesion. Stable chronic infarction involving the left lentiform nucleus, insula, and perisylvian frontal and temporal lobes. Wallerian degeneration of the right cerebral peduncle extending into brainstem. Motion degradation of several sequences. Vascular: Normal flow voids. Skull and upper cervical spine:  Normal marrow signal. Sinuses/Orbits: Negative. Other: None. IMPRESSION: 1. Chronic infarction involving right perisylvian frontal and temporal lobes as well as insula and lentiform nucleus. Wallerian degeneration of the right cerebral peduncle extending into brainstem. 2. No acute intracranial abnormality identified. Electronically Signed   By: Mitzi Hansen M.D.   On: 11/14/2017 17:28        Scheduled Meds: . apixaban  5 mg Oral BID  . enalapril  20 mg Oral BID  . escitalopram  10 mg Oral Daily  . hydrochlorothiazide  25 mg Oral Daily  . insulin aspart  0-15 Units Subcutaneous TID WC  . insulin aspart  0-5 Units Subcutaneous QHS  . levothyroxine  25 mcg Oral QAC breakfast  .  metoprolol succinate  50 mg Oral Q lunch  . sodium chloride flush  3 mL Intravenous Q12H   Continuous Infusions: . sodium chloride    . dextrose 50 mL/hr at 11/14/17 2321     LOS: 0 days    Time spent: . Greater than 50% of this time spent in direct contact with patient/husband discussing all imaging results, blood tests, plans for physical therapy placement as well as further work-up that is planned.  I also explained that her sedative medications will be held for now.    Erick Blinks, MD [facility] Pager [phone]  If 7PM-7AM, please contact night-coverage [email or web address] Password [facility] 11/15/2017, 6:43 PM

## 2017-11-15 NOTE — Care Management (Signed)
CM attempted to contact husband for [facility] delivery. No answer, did not leave VM. Will attempt call at later time.

## 2017-11-15 NOTE — NC FL2 (Signed)
[place] MEDICAID FL2 LEVEL OF CARE SCREENING TOOL     IDENTIFICATION  Patient Name: Megan [name] Birthdate: 07-17-1951 Sex: female Admission Date (Current Location): 11/14/2017  [facility] and [place] Number:  [employer] and Address:  [facility],  [address] [place] [address]      Provider Number: [id number]  Attending Physician Name and Address:  Erick Blinks, MD  Relative Name and Phone Number:       Current Level of Care: Other (Comment)(observation) Recommended Level of Care: Nursing Facility Prior Approval Number:    Date Approved/Denied:   PASRR Number:    Discharge Plan: SNF    Current Diagnoses: Patient Active Problem List   Diagnosis Date Noted  . Weakness 11/14/2017  . Acute metabolic encephalopathy 11/14/2017  . PUD (peptic ulcer disease) 04/07/2016  . Constipation 04/07/2016  . Diverticulosis of colon without hemorrhage   . Rectal bleeding   . BRBPR (bright red blood per rectum) 01/25/2016  . Hypoglycemia 01/20/2016  . Upper GI bleeding 01/20/2016  . H/O: CVA (cerebrovascular accident) 01/20/2016  . Chronic pain 01/20/2016  . DM type 2 (diabetes mellitus, type 2) (HCC) 01/20/2016  . Benign essential HTN 01/20/2016  . Anxiety and depression 01/20/2016  . Blood loss anemia 01/20/2016  . Knee pain, acute   . Cephalalgia   . GERD (gastroesophageal reflux disease) 06/14/2011  . FH: colon cancer 04/05/2011  . History of colon polyps 04/05/2011  . Left sided abdominal pain 04/05/2011  . N&V (nausea and vomiting) 04/05/2011    Orientation RESPIRATION BLADDER Height & Weight     Self  Normal Incontinent Weight: 212 lb 11.9 oz (96.5 kg) Height:     BEHAVIORAL SYMPTOMS/MOOD NEUROLOGICAL BOWEL NUTRITION STATUS      Continent (heart healthy/carb modified)  AMBULATORY STATUS COMMUNICATION OF NEEDS Skin   Extensive Assist Verbally Normal                       Personal Care Assistance Level of Assistance   Bathing, Feeding, Dressing Bathing Assistance: Maximum assistance Feeding assistance: Limited assistance Dressing Assistance: Maximum assistance     Functional Limitations Info  Sight, Hearing, Speech Sight Info: Adequate Hearing Info: Adequate Speech Info: Adequate    SPECIAL CARE FACTORS FREQUENCY        PT Frequency: 5x/week              Contractures Contractures Info: Not present    Additional Factors Info  Allergies, Code Status, Psychotropic Code Status Info: Full code Allergies Info: Naproxen Psychotropic Info: Valium, Lexapro, Desyrel         Current Medications (11/15/2017):  This is the current hospital active medication list Current Facility-Administered Medications  Medication Dose Route Frequency Provider Last Rate Last Dose  . 0.9 %  sodium chloride infusion  250 mL Intravenous PRN Haydee Monica, MD      . apixaban (ELIQUIS) tablet 5 mg  5 mg Oral BID Haydee Monica, MD   5 mg at 11/15/17 1135  . dextrose 5 % solution   Intravenous Continuous Madelyn Flavors A, MD 50 mL/hr at 11/14/17 2321    . enalapril (VASOTEC) tablet 20 mg  20 mg Oral BID Tarry Kos A, MD   20 mg at 11/15/17 1135  . escitalopram (LEXAPRO) tablet 10 mg  10 mg Oral Daily Tarry Kos A, MD   10 mg at 11/15/17 1135  . hydrochlorothiazide (HYDRODIURIL) tablet 25 mg  25 mg Oral  Daily Tarry Kos A, MD   25 mg at 11/15/17 1135  . levothyroxine (SYNTHROID, LEVOTHROID) tablet 25 mcg  25 mcg Oral QAC breakfast Haydee Monica, MD   25 mcg at 11/15/17 0819  . metoprolol succinate (TOPROL-XL) 24 hr tablet 50 mg  50 mg Oral Q lunch Tarry Kos A, MD   50 mg at 11/15/17 1146  . sodium chloride flush (NS) 0.9 % injection 3 mL  3 mL Intravenous Q12H Tarry Kos A, MD   3 mL at 11/15/17 1135  . sodium chloride flush (NS) 0.9 % injection 3 mL  3 mL Intravenous PRN Haydee Monica, MD         Discharge Medications: Please see discharge summary for a list of discharge  medications.  Relevant Imaging Results:  Relevant Lab Results:   Additional Information SSN [id number], Juleen China, LCSW

## 2017-11-15 NOTE — Progress Notes (Signed)
Inpatient Diabetes Program Recommendations  AACE/ADA: New Consensus Statement on Inpatient Glycemic Control (2019)  Target Ranges:  Prepandial:   less than 140 mg/dL      Peak postprandial:   less than 180 mg/dL (1-2 hours)      Critically ill patients:  140 - 180 mg/dL   Results for [name], [name] (MRN [id number]) as of 11/15/2017 08:32  Ref. Range 11/14/2017 14:17 11/14/2017 14:43 11/14/2017 15:27 11/14/2017 17:44 11/14/2017 19:45 11/14/2017 20:37 11/14/2017 21:03 11/14/2017 23:57 11/15/2017 04:36 11/15/2017 07:52  Glucose-Capillary Latest Ref Range: 65 - 99 mg/dL 45 (L) 409 (H) 811 (H) 106 (H) 64 (L) 64 (L) 75 96 184 (H) 195 (H)   Results for [name], [name] (MRN [id number]) as of 11/15/2017 08:32  Ref. Range 01/20/2016 19:25  Hemoglobin A1C Latest Ref Range: 4.8 - 5.6 % 5.6   Review of Glycemic Control  Diabetes history: DM2 Outpatient Diabetes medications: Metformin XR 1000 mg BID, Glipizide 10 mg QAM, Actos 15 mg daily Current orders for Inpatient glycemic control: CBG monitoring  Inpatient Diabetes Program Recommendations: Correction (SSI): Please consider ordering CBGs with Novolog 0-9 units TID with meals and Novolog 0-5 units QHS. HgbA1C: Please consider ordering an A1C to evaluate glycemic control over the past 2-3 months.  NOTE: Patient was initially noted to be hypoglycemic which has now resolved.   Thanks, Orlando Penner, RN, MSN, CDE Diabetes Coordinator Inpatient Diabetes Program [phone] (Team Pager from 8am to 5pm)

## 2017-11-16 ENCOUNTER — Observation Stay (HOSPITAL_COMMUNITY)
Admit: 2017-11-16 | Discharge: 2017-11-16 | Disposition: A | Discharge status: Home / Self Care | Payer: Medicare Other | Attending: Internal Medicine | Admitting: Internal Medicine

## 2017-11-16 DIAGNOSIS — R531 Weakness: Secondary | ICD-10-CM | POA: Diagnosis not present

## 2017-11-16 DIAGNOSIS — G9341 Metabolic encephalopathy: Secondary | ICD-10-CM | POA: Diagnosis not present

## 2017-11-16 DIAGNOSIS — G894 Chronic pain syndrome: Secondary | ICD-10-CM | POA: Diagnosis not present

## 2017-11-16 DIAGNOSIS — I10 Essential (primary) hypertension: Secondary | ICD-10-CM | POA: Diagnosis not present

## 2017-11-16 LAB — BASIC METABOLIC PANEL
ANION GAP: 18 — AB (ref 5–15)
BUN: 18 mg/dL (ref 6–20)
CHLORIDE: 95 mmol/L — AB (ref 101–111)
CO2: 24 mmol/L (ref 22–32)
CREATININE: 0.94 mg/dL (ref 0.44–1.00)
Calcium: 10 mg/dL (ref 8.9–10.3)
GFR calc non Af Amer: >60 mL/min (ref >60)
Glucose, Bld: 215 mg/dL — ABNORMAL HIGH (ref 65–99)
Potassium: 4 mmol/L (ref 3.5–5.1)
Sodium: 137 mmol/L (ref 135–145)

## 2017-11-16 LAB — GLUCOSE, CAPILLARY
GLUCOSE-CAPILLARY: 237 mg/dL — AB (ref 65–99)
GLUCOSE-CAPILLARY: 134 mg/dL — AB (ref 65–99)
Glucose-Capillary: 164 mg/dL — ABNORMAL HIGH (ref 65–99)
Glucose-Capillary: 140 mg/dL — ABNORMAL HIGH (ref 65–99)

## 2017-11-16 LAB — CBC
HCT: 47.7 % — ABNORMAL HIGH (ref 36.0–46.0)
Hemoglobin: 16.6 g/dL — ABNORMAL HIGH (ref 12.0–15.0)
MCH: 31.7 pg (ref 26.0–34.0)
MCHC: 34.8 g/dL (ref 30.0–36.0)
MCV: 91.2 fL (ref 78.0–100.0)
PLATELETS: 235 10*3/uL (ref 150–400)
RBC: 5.23 MIL/uL — ABNORMAL HIGH (ref 3.87–5.11)
RDW: 13 % (ref 11.5–15.5)
WBC: 13.2 10*3/uL — AB (ref 4.0–10.5)

## 2017-11-16 MED ORDER — METOPROLOL TARTRATE 5 MG/5ML IV SOLN
5.0000 mg | Freq: Four times a day (QID) | INTRAVENOUS | Status: DC
  Administered 2017-11-16 – 2017-11-17 (×4): 5 mg via INTRAVENOUS
  Filled 2017-11-16 (×4): qty 5

## 2017-11-16 MED ORDER — NALOXONE HCL 0.4 MG/ML IJ SOLN
0.4000 mg | INTRAMUSCULAR | Status: DC | PRN
  Administered 2017-11-16: 0.4 mg via INTRAVENOUS
  Filled 2017-11-16: qty 1

## 2017-11-16 NOTE — Progress Notes (Signed)
PROGRESS NOTE    Megan [name]  EAV:[id number] DOB: Jul 22, 1950 DOA: 11/14/2017 PCP: [name], MD    Brief Narrative:  67 year old female admitted to the hospital with worsening weakness and lethargy.  On multiple sedative medications.  Was also noted to be hypoglycemic and elevated creatinine.  She said poor p.o. intake for several days now.  Started on IV fluids.  Imaging of brain is unrevealing.  No obvious infection.  Suspect polypharmacy is because of her change in mental status.  Seen by physical therapy recommended skilled nursing facility placement.   Assessment & Plan:   Principal Problem:   Weakness Active Problems:   Hypoglycemia   H/O: CVA (cerebrovascular accident)   Chronic pain   DM type 2 (diabetes mellitus, type 2) (HCC)   Benign essential HTN   Acute metabolic encephalopathy   1. Acute encephalopathy.  Imaging of brain including CT and MRI unrevealing.  Urinalysis does not show any signs of infection.  She was on multiple sedative medications prior to admission which are likely playing a role.  ABG and ammonia also unrevealing.  Will check EEG.  Continue to monitor.   2. Diabetes.  Patient was having hypoglycemic episodes on arrival, which was likely contributing to #1.  Blood sugars have since stabilized.  Continue on sliding scale insulin. 3. Hypothyroidism.  Continue on Synthroid 4. Chronic pain syndrome.  Medications currently on hold. 5. History of CVA in the past.  She has residual left-sided weakness.  Physical therapy has evaluated the patient recommend skilled nursing facility placement. 6. AKI.  Likely related to poor p.o. intake.  Improving with IV fluids. 7. Urinary retention.  Found to have greater than 600 cc on bladder scan with poor urine output.  Foley catheter is in place.   DVT prophylaxis: eliquis Code Status: Full code Family Communication: Discussed with husband at the bedside Disposition Plan: Physical therapy has recommended  skilled nursing facility placement, although husband says that she will likely not agree with this.  She has not been awake enough to engage in conversation   Consultants:     Procedures:     Antimicrobials:      Subjective: Patient remains very somnolent, does not answer questions.  Moans when she is moved.  Does not follow commands  Objective: Vitals:   11/16/17 1125 11/16/17 1454 11/16/17 1733 11/16/17 1741  BP: 133/76 (!) 144/98 (!) 139/99 (!) 142/86  Pulse: 60 (!) 105 (!) 117 (!) 102  Resp: (!) Temp:  98 F (36.7 C)    TempSrc:  Oral    SpO2: 99% 99% 100% 99%  Weight:        Intake/Output Summary (Last 24 hours) at 11/16/2017 1834 Last data filed at 11/16/2017 0528 Gross per 24 hour  Intake 567.5 ml  Output 1375 ml  Net -807.5 ml   Filed Weights   11/14/17 2145 11/15/17 0547 11/16/17 0521  Weight: 96.3 kg (212 lb 4.9 oz) 96.5 kg (212 lb 11.9 oz) 92.9 kg (204 lb 12.9 oz)    Examination:  General exam: somnolent, moans to painful stimuli Respiratory system: Clear to auscultation. Respiratory effort normal. Cardiovascular system:RRR. No murmurs, rubs, gallops. Gastrointestinal system: Abdomen is nondistended, soft and nontender. No organomegaly or masses felt. Normal bowel sounds heard. Central nervous system:  No focal neurological deficits. Extremities: No C/C/E, +pedal pulses Skin: No rashes, lesions or ulcers Psychiatry: Lethargic, does not answer questions     Data Reviewed: I have personally reviewed  following labs and imaging studies  CBC: Recent Labs  Lab 11/14/17 1357 11/14/17 1401 11/15/17 0436 11/16/17 0557  WBC 5.9  --  5.5 13.2*  NEUTROABS 3.9  --   --   --   HGB 11.7* 12.2 13.0 16.6*  HCT 36.0 36.0 39.9 47.7*  MCV 95.0  --  94.5 91.2  PLT 218  --  235 235   Basic Metabolic Panel: Recent Labs  Lab 11/14/17 1357 11/14/17 1401 11/15/17 0436 11/16/17 0557  NA 141 140 140 137  K 4.1 4.2 4.1 4.0  CL 101 101 101 95*    CO2 28  --  26 24  GLUCOSE 77 74 196* 215*  BUN 37* 33* 25* 18  CREATININE 1.58* 1.50* 1.11* 0.94  CALCIUM 10.1  --  10.3 10.0   GFR: Estimated Creatinine Clearance: 61.6 mL/min (by C-G formula based on SCr of 0.94 mg/dL). Liver Function Tests: Recent Labs  Lab 11/14/17 1357  AST 21  ALT 14  ALKPHOS 119  BILITOT 0.5  PROT 7.7  ALBUMIN 3.7   No results for input(s): LIPASE, AMYLASE in the last 168 hours. Recent Labs  Lab 11/15/17 1908  AMMONIA 24   Coagulation Profile: Recent Labs  Lab 11/14/17 1357  INR 1.31   Cardiac Enzymes: No results for input(s): CKTOTAL, CKMB, CKMBINDEX, TROPONINI in the last 168 hours. BNP (last 3 results) No results for input(s): PROBNP in the last 8760 hours. HbA1C: No results for input(s): HGBA1C in the last 72 hours. CBG: Recent Labs  Lab 11/15/17 1611 11/15/17 2056 11/16/17 0741 11/16/17 1145 11/16/17 1624  GLUCAP 254* 193* 164* 237* 134*   Lipid Profile: No results for input(s): CHOL, HDL, LDLCALC, TRIG, CHOLHDL, LDLDIRECT in the last 72 hours. Thyroid Function Tests: No results for input(s): TSH, T4TOTAL, FREET4, T3FREE, THYROIDAB in the last 72 hours. Anemia Panel: No results for input(s): VITAMINB12, FOLATE, FERRITIN, TIBC, IRON, RETICCTPCT in the last 72 hours. Sepsis Labs: No results for input(s): PROCALCITON, LATICACIDVEN in the last 168 hours.  No results found for this or any previous visit (from the past 240 hour(s)).       Radiology Studies: No results found.      Scheduled Meds: . apixaban  5 mg Oral BID  . enalapril  20 mg Oral BID  . escitalopram  10 mg Oral Daily  . hydrochlorothiazide  25 mg Oral Daily  . insulin aspart  0-15 Units Subcutaneous TID WC  . insulin aspart  0-5 Units Subcutaneous QHS  . levothyroxine  25 mcg Oral QAC breakfast  . metoprolol tartrate  5 mg Intravenous Q6H  . sodium chloride flush  3 mL Intravenous Q12H   Continuous Infusions: . sodium chloride    . sodium  chloride 75 mL/hr at 11/16/17 0528     LOS: 0 days    Time spent:    Erick Blinks, MD [facility] Pager [phone]  If 7PM-7AM, please contact night-coverage [email or web address] Password [facility] 11/16/2017, 6:34 PM

## 2017-11-16 NOTE — Progress Notes (Signed)
EEG completed; results pending.    

## 2017-11-16 NOTE — Progress Notes (Addendum)
Physical Therapy Treatment Patient Details Name: Megan [name] MRN: [id number] DOB: 05/11/1951 Today's Date: 11/16/2017    History of Present Illness Megan [name] is a 67 y.o. female with medical history significant of previous CVA, chronic pain syndrome on multiple controlled substances, depression, GERD, chronically has left-sided weakness from previous stroke is brought in because of worsened left-sided weakness and altered mental status.  Patient seems overly sedated and somnolent.  Emergency room is concerned she is had another stroke.  She is moving all extremities randomly however.  She says she took some extra pain pills this morning.  She denies any fevers.  She denies any nausea vomiting or diarrhea.  She went to see her primary care physician today and was referred to the emergency department for further work-up.  She has had frequent falls also.  Patient referred for admission for possible new stroke and inability to walk.    PT Comments    Patient presents very lethargic, but once sat up at bedside becomes more responsive and able to follow most commands, slightly impulsive when having to urinate, able to stand and take a couple of steps to transfer to [facility].  Patient sat for approximately 15 minutes on BSC, became lethargic again after urinating, arousable once given verbal cues to transfer back to bed and once put in bed patient very lethargic - RN aware.  Patient will benefit from continued physical therapy in hospital and recommended venue below to increase strength, balance, endurance for safe ADLs and gait.    Follow Up Recommendations  SNF;Supervision/Assistance - 24 hour     Equipment Recommendations  None recommended by PT    Recommendations for Other Services       Precautions / Restrictions Precautions Precautions: Fall Restrictions Weight Bearing Restrictions: No    Mobility  Bed Mobility Overal bed mobility: Needs Assistance Bed Mobility: Supine to  Sit;Sit to Supine     Supine to sit: Max assist Sit to supine: Mod assist   General bed mobility comments: slow labored movement  Transfers Overall transfer level: Needs assistance Equipment used: 1 person hand held assist Transfers: Sit to/from [employer] Transfers Sit to Stand: Mod assist Stand pivot transfers: Mod assist          Ambulation/Gait Ambulation/Gait assistance: Max assist Ambulation Distance (Feet): 2 Feet Assistive device: 1 person hand held assist Gait Pattern/deviations: Decreased step length - right;Decreased step length - left;Decreased stride length Gait velocity: decreased   General Gait Details: limited to 2-3 very short shuffling side steps due to BLE weakness, fatgiue and lethargy   Stairs             Wheelchair Mobility    Modified Rankin (Stroke Patients Only)       Balance Overall balance assessment: Needs assistance Sitting-balance support: Feet supported;No upper extremity supported Sitting balance-Leahy Scale: Fair Sitting balance - Comments: fair/poor Postural control: Posterior lean Standing balance support: No upper extremity supported;During functional activity Standing balance-Leahy Scale: Poor Standing balance comment: with hand held assist                            Cognition Arousal/Alertness: Awake/alert;Lethargic Behavior During Therapy: Impulsive;Flat affect Overall Cognitive Status: No family/caregiver present to determine baseline cognitive functioning                                 General Comments: patient more  alert today, can converse, but continues to be lethargic      Exercises      General Comments        Pertinent Vitals/Pain Pain Assessment: No/denies pain    Home Living                      Prior Function            PT Goals (current goals can now be found in the care plan section) Acute Rehab PT Goals Patient Stated Goal: return home PT  Goal Formulation: With patient Time For Goal Achievement: 11/29/17 Potential to Achieve Goals: Fair Progress towards PT goals: Progressing toward goals    Frequency    Min 3X/week      PT Plan Current plan remains appropriate    Co-evaluation              AM-PAC PT "6 Clicks" Daily Activity  Outcome Measure  Difficulty turning over in bed (including adjusting bedclothes, sheets and blankets)?: A Lot Difficulty moving from lying on back to sitting on the side of the bed? : A Lot Difficulty sitting down on and standing up from a chair with arms (e.g., wheelchair, bedside commode, etc,.)?: A Lot Help needed moving to and from a bed to chair (including a wheelchair)?: A Lot Help needed walking in hospital room?: Total Help needed climbing 3-5 steps with a railing? : Total 6 Click Score: 10    End of Session   Activity Tolerance: Patient limited by fatigue;Patient limited by lethargy;Patient tolerated treatment well Patient left: in bed;with call bell/phone within reach;with bed alarm set Nurse Communication: Mobility status;Other (comment)(RN notified for assistance to clean patient before put back to bed) PT Visit Diagnosis: Unsteadiness on feet (R26.81);Other abnormalities of gait and mobility (R26.89);Muscle weakness (generalized) (M62.81)     Time: 1610-9604 PT Time Calculation (min) (ACUTE ONLY): 31 min  Charges:  $Therapeutic Activity: 23-37 mins                    G Codes:       2:24 PM, 11-Dec-2017 Ocie Bob, MPT Physical Therapist with [facility] [phone] office [phone] mobile phone

## 2017-11-16 NOTE — Clinical Social Work Note (Addendum)
Clinical Social Work Assessment  Patient Details  Name: Megan [name] MRN: [id number] Date of Birth: 1951-03-30  Date of referral:  11/16/17               Reason for consult:  Facility Placement                Permission sought to share information with:    Permission granted to share information::     Name::        Agency::     Relationship::     Contact Information:  spouse, Megan [name]  Housing/Transportation Living arrangements for the past 2 months:  Single Family Home Source of Information:  Spouse Patient Interpreter Needed:  None Criminal Activity/Legal Involvement Pertinent to Current Situation/Hospitalization:  No - Comment as needed Significant Relationships:  Spouse Lives with:  Spouse Do you feel safe going back to the place where you live?  Yes Need for family participation in patient care:  No (Coment)  Care giving concerns:  Patient is independent at baseline.    Social Worker assessment / plan: At baseline, patient ambulates with a cane.  She is independent in her ADLs (due to bathtub height, she cannot use the tub so she takes "sink baths"). Patient drives "some." Patient has tried out patient 2-3 times but did not like it and only went to a few sessions. Patient's spouse states that he does not think she will like STR at a SNF.  He states that patient is "hooked on dang pills and does not want to get off of them." He states that patient discussed a family history of SA.  He attributed patient's inability to function physically to her misuse of prescribed medications and not being deconditioned.   LCSW will reattempt to assess patient (assessment was attempted on 11/15/17, but patient was too lethargic) to address STR at SNF as well as SA concerns.   Employment status:  Retired Health and safety inspector:    PT Recommendations:  Skilled Teacher, early years/pre / Referral to community resources:  Skilled Nursing Facility  Patient/Family's Response to care:   Spouse is not agreeable to STR at [facility].   Patient/Family's Understanding of and Emotional Response to Diagnosis, Current Treatment, and Prognosis:  Family verbalize understanding of patient's diagnosis, treatment and prognosis.   Emotional Assessment Appearance:  Appears stated age Attitude/Demeanor/Rapport:  Lethargic Affect (typically observed):  Unable to Assess Orientation:  Oriented to Self Alcohol / Substance use:  Not Applicable Psych involvement (Current and /or in the community):  No (Comment)  Discharge Needs  Concerns to be addressed:  Discharge Planning Concerns Readmission within the last 30 days:  No Current discharge risk:  None Barriers to Discharge:  No Barriers Identified   Annice Needy, LCSW 11/16/2017, 9:21 AM

## 2017-11-16 NOTE — Care Management Obs Status (Signed)
MEDICARE OBSERVATION STATUS NOTIFICATION   Patient Details  Name: Megan [name] MRN: [id number] Date of Birth: 1951-01-17   Medicare Observation Status Notification Given:  No(unable to contact family member)    Malcolm Metro, RN 11/16/2017, 8:34 AM

## 2017-11-16 NOTE — Clinical Social Work Note (Signed)
Patient's husband is at  Bedside and we discuss STR at SNF. He is agreeable for her to go in [place]. Patient is listening and indicates that she will go to STR in [place] but will not go to [place].    LCSW faxed clinicals to SNF's in [place].    Megan [name], Megan China, LCSW

## 2017-11-16 NOTE — Clinical Social Work Note (Signed)
LCSW attempted multiple times to arouse patient and could not. LCSW will re-attempt to assess later today.    Megan [name], Juleen China, LCSW

## 2017-11-16 NOTE — Procedures (Signed)
History: 67 year old female being evaluated for altered mental status  Sedation: None  Technique: This is a 21 channel routine scalp EEG performed at the bedside with bipolar and monopolar montages arranged in accordance to the international 10/20 system of electrode placement. One channel was dedicated to EKG recording.    Background: There is an asymmetric background with attenuation of faster frequencies in the right compared to the left.  In addition there is generalized irregular delta and theta activity that is more bilateral.  There is a posterior dominant rhythm that achieves a frequency of 8 Hz.  In addition, there are occasional right sided sharp waves maximal at 02 >P4.  Photic stimulation: Physiologic driving is not performed  EEG Abnormalities: 1) right parieto-occipital sharp waves 2) attenuation of faster frequencies in the right 3) generalized irregular slow activity  Clinical Interpretation: This EEG recorded evidence of a potential epileptogenic zone in the right parieto-occipital region.  There was no seizure recorded on the study.  There was no ongoing seizure to explain persistent static encephalopathy, however there was evidence of a generalized nonspecific cerebral dysfunction (encephalopathy) as well as focal right hemispheric dysfunction consistent with the patient's known stroke.  Ritta Slot, MD [facility] [phone]  If 7pm- 7am, please page neurology on call as listed in AMION.

## 2017-11-16 NOTE — Progress Notes (Signed)
Late entry:  Patient lethargic this morning.  Will arouse some to voice.  Mumbles and moves extremities.  Dr. Kerry Hough notified.  Narcan ordered and given.  Patient up with PT to [facility].  Patient slightly more alert after Narcan and then lethargic again.  Dr. Kerry Hough notified.    Attempted to give patient medication.  Patient holding water in mouth.  Dr. Kerry Hough notified via text page.

## 2017-11-16 NOTE — Progress Notes (Signed)
Received call from EEG department.  EEG will be completed this afternoon.

## 2017-11-16 NOTE — Progress Notes (Signed)
Patient remains lethargic this morning but is now completely oriented to person and place. Does state that she knows she had a stroke sometime recently when questioned about why she is in the hospital. Immediately goes back to sleep following conversation but does seem to understand significantly more than in this RN's assessment last night.

## 2017-11-16 NOTE — Progress Notes (Signed)
Patient has not voided.  Bladder scan shows 600 ml.  Dr. Kerry Hough notified via text page.  Dr. Kerry Hough called RN and gave order to insert foley catheter.

## 2017-11-16 NOTE — Progress Notes (Signed)
EEG department notified of ordered EEG - left message.

## 2017-11-17 DIAGNOSIS — G9341 Metabolic encephalopathy: Secondary | ICD-10-CM | POA: Diagnosis not present

## 2017-11-17 DIAGNOSIS — I482 Chronic atrial fibrillation, unspecified: Secondary | ICD-10-CM | POA: Diagnosis present

## 2017-11-17 DIAGNOSIS — E162 Hypoglycemia, unspecified: Secondary | ICD-10-CM

## 2017-11-17 DIAGNOSIS — E118 Type 2 diabetes mellitus with unspecified complications: Secondary | ICD-10-CM | POA: Diagnosis not present

## 2017-11-17 DIAGNOSIS — R531 Weakness: Secondary | ICD-10-CM | POA: Diagnosis not present

## 2017-11-17 DIAGNOSIS — I10 Essential (primary) hypertension: Secondary | ICD-10-CM | POA: Diagnosis not present

## 2017-11-17 DIAGNOSIS — Z7901 Long term (current) use of anticoagulants: Secondary | ICD-10-CM

## 2017-11-17 LAB — GLUCOSE, CAPILLARY
GLUCOSE-CAPILLARY: 144 mg/dL — AB (ref 65–99)
GLUCOSE-CAPILLARY: 162 mg/dL — AB (ref 65–99)
GLUCOSE-CAPILLARY: 114 mg/dL — AB (ref 65–99)
Glucose-Capillary: 132 mg/dL — ABNORMAL HIGH (ref 65–99)

## 2017-11-17 LAB — BASIC METABOLIC PANEL
Anion gap: 13 (ref 5–15)
BUN: 32 mg/dL — AB (ref 6–20)
CHLORIDE: 103 mmol/L (ref 101–111)
CO2: 23 mmol/L (ref 22–32)
CREATININE: 1.19 mg/dL — AB (ref 0.44–1.00)
Calcium: 9.2 mg/dL (ref 8.9–10.3)
GFR calc Af Amer: 54 mL/min — ABNORMAL LOW (ref >60)
GFR, EST NON AFRICAN AMERICAN: 46 mL/min — AB (ref >60)
GLUCOSE: 155 mg/dL — AB (ref 65–99)
POTASSIUM: 4.4 mmol/L (ref 3.5–5.1)
SODIUM: 139 mmol/L (ref 135–145)

## 2017-11-17 LAB — CBC
HCT: 41.9 % (ref 36.0–46.0)
Hemoglobin: 14.3 g/dL (ref 12.0–15.0)
MCH: 31 pg (ref 26.0–34.0)
MCHC: 34.1 g/dL (ref 30.0–36.0)
MCV: 90.9 fL (ref 78.0–100.0)
PLATELETS: 251 10*3/uL (ref 150–400)
RBC: 4.61 MIL/uL (ref 3.87–5.11)
RDW: 13.1 % (ref 11.5–15.5)
WBC: 12.1 10*3/uL — ABNORMAL HIGH (ref 4.0–10.5)

## 2017-11-17 MED ORDER — METOPROLOL SUCCINATE ER 50 MG PO TB24
50.0000 mg | ORAL_TABLET | Freq: Every day | ORAL | Status: DC
  Administered 2017-11-17 – 2017-11-19 (×3): 50 mg via ORAL
  Filled 2017-11-17 (×3): qty 1

## 2017-11-17 MED ORDER — LEVETIRACETAM 250 MG PO TABS
250.0000 mg | ORAL_TABLET | Freq: Two times a day (BID) | ORAL | Status: DC
  Administered 2017-11-17 – 2017-11-19 (×4): 250 mg via ORAL
  Filled 2017-11-17 (×5): qty 1

## 2017-11-17 NOTE — Progress Notes (Addendum)
CSW following for discharge plan. CSW contacted by RN to discuss discharge plan. Per RN, patient and family would like patient to be placed in [facility], if possible. Previous CSW sent referral, but there has been no response from [facility] at this time. CSW left voicemail for Admissions at [facility] to review referral to determine ability to offer a bed for the patient.  Patient also has not yet received insurance approval from [facility] Medicare to admit to SNF at discharge. Patient must receive insurance approval prior to transition to SNF, and authorization will not be received over the weekend.  CSW will continue to follow.  Blenda Nicely, LCSW Clinical Social Worker [phone]    UPDATE 3:00 PM:  CSW has not heard back from [facility]; may not have anyone working admissions over the weekend. CSW will follow up on Monday, or will update the team if any additional information is received.  Blenda Nicely, [place] Clinical Social Worker [phone]

## 2017-11-17 NOTE — Progress Notes (Signed)
PROGRESS NOTE    Megan [name]  ZOX:[id number] DOB: 06/28/1951 DOA: 11/14/2017 PCP: [name], MD    Brief Narrative:  67 year old female admitted to the hospital with worsening weakness and lethargy.  On multiple sedative medications.  Was also noted to be hypoglycemic and elevated creatinine.  She said poor p.o. intake for several days now.  Started on IV fluids.  Imaging of brain is unrevealing.  No obvious infection.  Suspect polypharmacy is because of her change in mental status.  Seen by physical therapy recommended skilled nursing facility placement.   Assessment & Plan:   Principal Problem:   Weakness Active Problems:   Hypoglycemia   H/O: CVA (cerebrovascular accident)   Chronic pain   DM type 2 (diabetes mellitus, type 2) (HCC)   Benign essential HTN   Acute metabolic encephalopathy   1. Acute encephalopathy.  Imaging of brain including CT and MRI unrevealing.  Urinalysis does not show any signs of infection.  She was on multiple sedative medications prior to admission which are likely playing a role.  ABG and ammonia also unrevealing.  EEG did not show any clear epileptiform discharges, but did show evidence of right parieto-occipital sharp waves, a focal right hemispheric dysfunction consistent with patient's known stroke.  Discussed with Dr. Amada Jupiter with neurology who agreed that this would put her at higher risk for seizures.  Since her benzodiazepines are being weaned, she will be started on low-dose Keppra.  Continue to monitor.   2. Atrial fibrillation.  Anticoagulated with apixaban.  Heart rate is currently tachycardic since she is been unable to take her oral metoprolol.  Now that she is more awake, will restart her on oral Toprol.  Heart rate ranging between 110-130 on telemetry. 3. Diabetes.  Patient was having hypoglycemic episodes on arrival, which was likely contributing to #1.  Blood sugars have since stabilized.  Continue on sliding scale  insulin. 4. Hypothyroidism.  Continue on Synthroid 5. Chronic pain syndrome.  Medications currently on hold. 6. History of CVA in the past.  She has residual left-sided weakness.  Physical therapy has evaluated the patient recommend skilled nursing facility placement. 7. AKI.  Likely related to poor p.o. intake.  Improving with IV fluids. 8. Urinary retention.  Found to have greater than 600 cc on bladder scan with poor urine output.  Foley catheter is in place.  Will attempt voiding trial in a.m.   DVT prophylaxis: eliquis Code Status: Full code Family Communication: Discussed with husband at the bedside Disposition Plan: Physical therapy has recommended skilled nursing facility placement.  After patient discussed with family, she is now agreeable for placement.  Social worker is working on this.   Consultants:     Procedures:     Antimicrobials:      Subjective: Does not have any recollection of why she was brought to the hospital.  Denies any pain.  Objective: Vitals:   11/16/17 2210 11/17/17 0100 11/17/17 0519 11/17/17 1349  BP: (!) 146/96 128/73 128/78 140/77  Pulse: 96 90 96 (!) 102  Resp: Temp: (!) 97.4 F (36.3 C)  98.6 F (37 C) 99.1 F (37.3 C)  TempSrc: Oral  Oral   SpO2: 99%  98% 96%  Weight:   93.8 kg (206 lb 12.7 oz)     Intake/Output Summary (Last 24 hours) at 11/17/2017 1609 Last data filed at 11/17/2017 1130 Gross per 24 hour  Intake -  Output 1250 ml  Net -1250 ml  Filed Weights   11/15/17 0547 11/16/17 0521 11/17/17 0519  Weight: 96.5 kg (212 lb 11.9 oz) 92.9 kg (204 lb 12.9 oz) 93.8 kg (206 lb 12.7 oz)    Examination:  General exam: Awake, no acute distress Respiratory system: Clear to auscultation. Respiratory effort normal. Cardiovascular system: Irregular. No murmurs, rubs, gallops. Gastrointestinal system: Abdomen is nondistended, soft and nontender. No organomegaly or masses felt. Normal bowel sounds heard. Central  nervous system:  No focal neurological deficits. Extremities: No C/C/E, +pedal pulses Skin: No rashes, lesions or ulcers Psychiatry: Awake, cooperates with exam.  Does not appear anxious.  Answering questions appropriately     Data Reviewed: I have personally reviewed following labs and imaging studies  CBC: Recent Labs  Lab 11/14/17 1357 11/14/17 1401 11/15/17 0436 11/16/17 0557 11/17/17 0720  WBC 5.9  --  5.5 13.2* 12.1*  NEUTROABS 3.9  --   --   --   --   HGB 11.7* 12.2 13.0 16.6* 14.3  HCT 36.0 36.0 39.9 47.7* 41.9  MCV 95.0  --  94.5 91.2 90.9  PLT 218  --  235 235 251   Basic Metabolic Panel: Recent Labs  Lab 11/14/17 1357 11/14/17 1401 11/15/17 0436 11/16/17 0557 11/17/17 0720  NA 141 140 140 137 139  K 4.1 4.2 4.1 4.0 4.4  CL 101 101 101 95* 103  CO2 28  --  GLUCOSE 77 74 196* 215* 155*  BUN 37* 33* 25* 18 32*  CREATININE 1.58* 1.50* 1.11* 0.94 1.19*  CALCIUM 10.1  --  10.3 10.0 9.2   GFR: Estimated Creatinine Clearance: 49 mL/min (A) (by C-G formula based on SCr of 1.19 mg/dL (H)). Liver Function Tests: Recent Labs  Lab 11/14/17 1357  AST 21  ALT 14  ALKPHOS 119  BILITOT 0.5  PROT 7.7  ALBUMIN 3.7   No results for input(s): LIPASE, AMYLASE in the last 168 hours. Recent Labs  Lab 11/15/17 1908  AMMONIA 24   Coagulation Profile: Recent Labs  Lab 11/14/17 1357  INR 1.31   Cardiac Enzymes: No results for input(s): CKTOTAL, CKMB, CKMBINDEX, TROPONINI in the last 168 hours. BNP (last 3 results) No results for input(s): PROBNP in the last 8760 hours. HbA1C: No results for input(s): HGBA1C in the last 72 hours. CBG: Recent Labs  Lab 11/16/17 1145 11/16/17 1624 11/16/17 2212 11/17/17 0732 11/17/17 1128  GLUCAP 237* 134* 140* 144* 162*   Lipid Profile: No results for input(s): CHOL, HDL, LDLCALC, TRIG, CHOLHDL, LDLDIRECT in the last 72 hours. Thyroid Function Tests: No results for input(s): TSH, T4TOTAL, FREET4, T3FREE,  THYROIDAB in the last 72 hours. Anemia Panel: No results for input(s): VITAMINB12, FOLATE, FERRITIN, TIBC, IRON, RETICCTPCT in the last 72 hours. Sepsis Labs: No results for input(s): PROCALCITON, LATICACIDVEN in the last 168 hours.  No results found for this or any previous visit (from the past 240 hour(s)).       Radiology Studies: No results found.      Scheduled Meds: . apixaban  5 mg Oral BID  . enalapril  20 mg Oral BID  . escitalopram  10 mg Oral Daily  . insulin aspart  0-15 Units Subcutaneous TID WC  . insulin aspart  0-5 Units Subcutaneous QHS  . levETIRAcetam  250 mg Oral BID  . levothyroxine  25 mcg Oral QAC breakfast  . metoprolol succinate  50 mg Oral Daily  . sodium chloride flush  3 mL Intravenous Q12H   Continuous Infusions: .  sodium chloride    . sodium chloride 75 mL/hr at 11/17/17 1151     LOS: 0 days    Time spent:    Erick Blinks, MD [facility] Pager [phone]  If 7PM-7AM, please contact night-coverage [email or web address] Password [email or web address] 11/17/2017, 4:09 PM

## 2017-11-18 ENCOUNTER — Other Ambulatory Visit: Payer: Self-pay

## 2017-11-18 DIAGNOSIS — R531 Weakness: Secondary | ICD-10-CM | POA: Diagnosis not present

## 2017-11-18 DIAGNOSIS — I482 Chronic atrial fibrillation: Secondary | ICD-10-CM | POA: Diagnosis not present

## 2017-11-18 DIAGNOSIS — G9341 Metabolic encephalopathy: Secondary | ICD-10-CM | POA: Diagnosis not present

## 2017-11-18 DIAGNOSIS — I10 Essential (primary) hypertension: Secondary | ICD-10-CM | POA: Diagnosis not present

## 2017-11-18 LAB — GLUCOSE, CAPILLARY
GLUCOSE-CAPILLARY: 100 mg/dL — AB (ref 65–99)
Glucose-Capillary: 139 mg/dL — ABNORMAL HIGH (ref 65–99)
Glucose-Capillary: 130 mg/dL — ABNORMAL HIGH (ref 65–99)
Glucose-Capillary: 107 mg/dL — ABNORMAL HIGH (ref 65–99)

## 2017-11-18 NOTE — Progress Notes (Signed)
PROGRESS NOTE    Megan [name]  ZOX:[id number] DOB: 03-21-1951 DOA: 11/14/2017 PCP: [name], MD    Brief Narrative:  67 year old female admitted to the hospital with worsening weakness and lethargy.  On multiple sedative medications.  Was also noted to be hypoglycemic and elevated creatinine.  She said poor p.o. intake for several days now.  Started on IV fluids.  Imaging of brain is unrevealing.  No obvious infection.  Suspect polypharmacy is because of her change in mental status.  Seen by physical therapy recommended skilled nursing facility placement.   Assessment & Plan:   Principal Problem:   Weakness Active Problems:   Hypoglycemia   H/O: CVA (cerebrovascular accident)   Chronic pain   DM type 2 (diabetes mellitus, type 2) (HCC)   Benign essential HTN   Acute metabolic encephalopathy   Atrial fibrillation, chronic (HCC)   Chronic anticoagulation   1. Acute encephalopathy.  Imaging of brain including CT and MRI unrevealing.  Urinalysis does not show any signs of infection.  She was on multiple sedative medications prior to admission which are the cause of her encephalopathy.  ABG and ammonia also unrevealing.  EEG did not show any clear epileptiform discharges, but did show evidence of right parieto-occipital sharp waves, a focal right hemispheric dysfunction consistent with patient's known stroke.  Discussed with Dr. Amada Jupiter with neurology who agreed that this would put her at higher risk for seizures.  Since her benzodiazepines are being weaned, she will be started on low-dose Keppra.  Since sedative medications have been held, mental status has gradually improved and is now back to baseline.  Continue to monitor.   2. Atrial fibrillation.  Anticoagulated with apixaban.  Heart rate stable on Toprol. 3. Diabetes.  Patient was having hypoglycemic episodes on arrival, which was likely contributing to #1.  Blood sugars have since stabilized.  Continue on sliding scale  insulin. 4. Hypothyroidism.  Continue on Synthroid 5. Chronic pain syndrome.  Medications currently on hold.  Patient will need referral to pain management clinic as an outpatient since she is taking large amounts of pain medications. 6. History of CVA in the past.  She has residual left-sided weakness.  Physical therapy has evaluated the patient recommend skilled nursing facility placement. 7. AKI.  Likely related to poor p.o. intake.  Improving with IV fluids. 8. Urinary retention.  Found to have greater than 600 cc on bladder scan with poor urine output.  Foley catheter placed.  Will discontinue Foley for voiding trial.  She is unable to void, she will need to discharge with Foley catheter and outpatient urology follow-up.   DVT prophylaxis: eliquis Code Status: Full code Family Communication: Discussed with husband at the bedside Disposition Plan: Physical therapy has recommended skilled nursing facility placement.  Patient is agreeable for placement.  Social work is working on Insurance underwriter:     Procedures:     Antimicrobials:      Subjective: No new complaints.  Awake, sitting up in bed.  Speaking appropriately.  Objective: Vitals:   11/17/17 2156 11/17/17 2210 11/18/17 0516 11/18/17 1310  BP: 133/86  133/71 (!) 153/97  Pulse: (!) 46  96 86  Resp: Temp: (!) 97.4 F (36.3 C)  98.3 F (36.8 C) 98.1 F (36.7 C)  TempSrc: Oral  Oral Oral  SpO2: (!) 77% 95% 97% 99%  Weight:        Intake/Output Summary (Last 24 hours) at 11/18/2017 1916 Last  data filed at 11/18/2017 1803 Gross per 24 hour  Intake 780 ml  Output 3300 ml  Net -2520 ml   Filed Weights   11/15/17 0547 11/16/17 0521 11/17/17 0519  Weight: 96.5 kg (212 lb 11.9 oz) 92.9 kg (204 lb 12.9 oz) 93.8 kg (206 lb 12.7 oz)    Examination:  General exam: Alert, awake, oriented x 3 Respiratory system: Clear to auscultation. Respiratory effort normal. Cardiovascular system:RRR. No  murmurs, rubs, gallops. Gastrointestinal system: Abdomen is nondistended, soft and nontender. No organomegaly or masses felt. Normal bowel sounds heard. Central nervous system: Alert and oriented. No focal neurological deficits. Extremities: No C/C/E, +pedal pulses Skin: No rashes, lesions or ulcers Psychiatry: Judgement and insight appear normal. Mood & affect appropriate.   Data Reviewed: I have personally reviewed following labs and imaging studies  CBC: Recent Labs  Lab 11/14/17 1357 11/14/17 1401 11/15/17 0436 11/16/17 0557 11/17/17 0720  WBC 5.9  --  5.5 13.2* 12.1*  NEUTROABS 3.9  --   --   --   --   HGB 11.7* 12.2 13.0 16.6* 14.3  HCT 36.0 36.0 39.9 47.7* 41.9  MCV 95.0  --  94.5 91.2 90.9  PLT 218  --  235 235 251   Basic Metabolic Panel: Recent Labs  Lab 11/14/17 1357 11/14/17 1401 11/15/17 0436 11/16/17 0557 11/17/17 0720  NA 141 140 140 137 139  K 4.1 4.2 4.1 4.0 4.4  CL 101 101 101 95* 103  CO2 28  --  GLUCOSE 77 74 196* 215* 155*  BUN 37* 33* 25* 18 32*  CREATININE 1.58* 1.50* 1.11* 0.94 1.19*  CALCIUM 10.1  --  10.3 10.0 9.2   GFR: Estimated Creatinine Clearance: 49 mL/min (A) (by C-G formula based on SCr of 1.19 mg/dL (H)). Liver Function Tests: Recent Labs  Lab 11/14/17 1357  AST 21  ALT 14  ALKPHOS 119  BILITOT 0.5  PROT 7.7  ALBUMIN 3.7   No results for input(s): LIPASE, AMYLASE in the last 168 hours. Recent Labs  Lab 11/15/17 1908  AMMONIA 24   Coagulation Profile: Recent Labs  Lab 11/14/17 1357  INR 1.31   Cardiac Enzymes: No results for input(s): CKTOTAL, CKMB, CKMBINDEX, TROPONINI in the last 168 hours. BNP (last 3 results) No results for input(s): PROBNP in the last 8760 hours. HbA1C: No results for input(s): HGBA1C in the last 72 hours. CBG: Recent Labs  Lab 11/17/17 1620 11/17/17 2159 11/18/17 0742 11/18/17 1118 11/18/17 1625  GLUCAP 114* 132* 139* 130* 100*   Lipid Profile: No results for input(s):  CHOL, HDL, LDLCALC, TRIG, CHOLHDL, LDLDIRECT in the last 72 hours. Thyroid Function Tests: No results for input(s): TSH, T4TOTAL, FREET4, T3FREE, THYROIDAB in the last 72 hours. Anemia Panel: No results for input(s): VITAMINB12, FOLATE, FERRITIN, TIBC, IRON, RETICCTPCT in the last 72 hours. Sepsis Labs: No results for input(s): PROCALCITON, LATICACIDVEN in the last 168 hours.  No results found for this or any previous visit (from the past 240 hour(s)).       Radiology Studies: No results found.      Scheduled Meds: . apixaban  5 mg Oral BID  . enalapril  20 mg Oral BID  . escitalopram  10 mg Oral Daily  . insulin aspart  0-15 Units Subcutaneous TID WC  . insulin aspart  0-5 Units Subcutaneous QHS  . levETIRAcetam  250 mg Oral BID  . levothyroxine  25 mcg Oral QAC breakfast  . metoprolol succinate  50 mg Oral Daily  . sodium chloride flush  3 mL Intravenous Q12H   Continuous Infusions: . sodium chloride    . sodium chloride 100 mL/hr at 11/18/17 1534     LOS: 0 days    Time spent:    Erick Blinks, MD [facility] Pager [phone]  If 7PM-7AM, please contact night-coverage [email or web address] Password [email or web address] 11/18/2017, 7:16 PM

## 2017-11-19 DIAGNOSIS — E118 Type 2 diabetes mellitus with unspecified complications: Secondary | ICD-10-CM | POA: Diagnosis not present

## 2017-11-19 DIAGNOSIS — R531 Weakness: Secondary | ICD-10-CM | POA: Diagnosis not present

## 2017-11-19 DIAGNOSIS — I482 Chronic atrial fibrillation: Secondary | ICD-10-CM | POA: Diagnosis not present

## 2017-11-19 DIAGNOSIS — Z7901 Long term (current) use of anticoagulants: Secondary | ICD-10-CM | POA: Diagnosis not present

## 2017-11-19 DIAGNOSIS — G9341 Metabolic encephalopathy: Secondary | ICD-10-CM | POA: Diagnosis not present

## 2017-11-19 DIAGNOSIS — I10 Essential (primary) hypertension: Secondary | ICD-10-CM | POA: Diagnosis not present

## 2017-11-19 LAB — GLUCOSE, CAPILLARY
Glucose-Capillary: 123 mg/dL — ABNORMAL HIGH (ref 65–99)
Glucose-Capillary: 107 mg/dL — ABNORMAL HIGH (ref 65–99)

## 2017-11-19 LAB — CBC
HEMATOCRIT: 39.3 % (ref 36.0–46.0)
Hemoglobin: 13.3 g/dL (ref 12.0–15.0)
MCH: 30.8 pg (ref 26.0–34.0)
MCHC: 33.8 g/dL (ref 30.0–36.0)
MCV: 91 fL (ref 78.0–100.0)
Platelets: 237 10*3/uL (ref 150–400)
RBC: 4.32 MIL/uL (ref 3.87–5.11)
RDW: 12.8 % (ref 11.5–15.5)
WBC: 10.4 10*3/uL (ref 4.0–10.5)

## 2017-11-19 LAB — BASIC METABOLIC PANEL
Anion gap: 12 (ref 5–15)
BUN: 19 mg/dL (ref 6–20)
CO2: 29 mmol/L (ref 22–32)
Calcium: 9.2 mg/dL (ref 8.9–10.3)
Chloride: 99 mmol/L — ABNORMAL LOW (ref 101–111)
Creatinine, Ser: 0.82 mg/dL (ref 0.44–1.00)
GFR calc Af Amer: >60 mL/min (ref >60)
GFR calc non Af Amer: >60 mL/min (ref >60)
Glucose, Bld: 145 mg/dL — ABNORMAL HIGH (ref 65–99)
POTASSIUM: 2.8 mmol/L — AB (ref 3.5–5.1)
SODIUM: 140 mmol/L (ref 135–145)

## 2017-11-19 MED ORDER — POTASSIUM CHLORIDE CRYS ER 20 MEQ PO TBCR
60.0000 meq | EXTENDED_RELEASE_TABLET | Freq: Once | ORAL | Status: AC
  Administered 2017-11-19: 60 meq via ORAL
  Filled 2017-11-19: qty 3

## 2017-11-19 MED ORDER — LEVETIRACETAM 250 MG PO TABS: 250.0000 mg | ORAL_TABLET | Freq: Two times a day (BID) | ORAL | Status: DC

## 2017-11-19 MED ORDER — ESCITALOPRAM OXALATE 10 MG PO TABS: 10.0000 mg | ORAL_TABLET | Freq: Every day | ORAL | Status: DC

## 2017-11-19 MED ORDER — GLIPIZIDE 5 MG PO TABS: 5.0000 mg | ORAL_TABLET | Freq: Every day | ORAL | Status: DC

## 2017-11-19 MED ORDER — ACETAMINOPHEN 325 MG PO TABS: 650.0000 mg | ORAL_TABLET | Freq: Four times a day (QID) | ORAL | Status: AC | PRN

## 2017-11-19 NOTE — Discharge Summary (Signed)
Physician Discharge Summary  Megan [name] XBM:[id number] DOB: [birth year] DOA: 11/14/2017  PCP: [name], MD  Admit date: 11/14/2017 Discharge date: 11/19/2017  Admitted From: Home   Disposition: Skilled Nursing Facility  Recommendations for SNF  1. Follow up with PCP in 2 weeks 2. Follow up with cardiology in 3 weeks 3. Follow up with neurology in 1 month  4. Please avoid sedative medications as much as possible. 5. Please monitor blood sugars AC and HS. Please discontinue glipizide for any blood sugar less than 90.  6. Please monitor for spontaneous voiding as foley was removed on 11/18/17.  Pt has voided several times in hospital since foley was removed. 7. Please refer patient to an outpatient pain management clinic if needed.  At this time advise holding all opioid medications.  8. Please obtain BMP/CBC in one week 9. Please follow up on the following pending results: Final culture data  Discharge Condition: STABLE   CODE STATUS: FULL    Brief Hospitalization Summary: Please see all hospital notes, images, labs for full details of the hospitalization.  HPI: Megan [name] is a 67 y.o. female with medical history significant of previous CVA, chronic pain syndrome on multiple controlled substances, depression, GERD, chronically has left-sided weakness from previous stroke is brought in because of worsened left-sided weakness and altered mental status.  Patient seems overly sedated and somnolent.  Emergency room is concerned she is had another stroke.  She is moving all extremities randomly however.  She says she took some extra pain pills this morning.  She denies any fevers.  She denies any nausea vomiting or diarrhea.  She went to see her primary care physician today and was referred to the emergency department for further work-up.  She has had frequent falls also.  Patient referred for admission for possible new stroke and inability to walk.  Brief Narrative:   67 year old female admitted to the hospital with worsening weakness and lethargy.  On multiple sedative medications.  Was also noted to be hypoglycemic and elevated creatinine.  She said poor p.o. intake for several days now.  Started on IV fluids.  Imaging of brain is unrevealing.  No obvious infection.  Suspect polypharmacy is because of her change in mental status.  Seen by physical therapy recommended skilled nursing facility placement.  Assessment & Plan:   Principal Problem:   Weakness Active Problems:   Hypoglycemia   H/O: CVA (cerebrovascular accident)   Chronic pain   DM type 2 (diabetes mellitus, type 2) (HCC)   Benign essential HTN   Acute metabolic encephalopathy   Atrial fibrillation, chronic (HCC)   Chronic anticoagulation  1. Acute encephalopathy.  Imaging of brain including CT and MRI unrevealing.  Urinalysis does not show any signs of infection.  She was on multiple sedative medications prior to admission which are the cause of her encephalopathy.  ABG and ammonia also unrevealing.  EEG did not show any clear epileptiform discharges, but did show evidence of right parieto-occipital sharp waves, a focal right hemispheric dysfunction consistent with patient's known stroke.  Dr. Kerry Hough discussed with Dr. Amada Jupiter with neurology team who agreed that this would put her at higher risk for seizures.  Since her benzodiazepines are being weaned, she was started on low-dose Keppra.  Since sedative medications have been held, mental status has gradually improved and is now closer to baseline per husband.  She still has some confusion.     2. Atrial fibrillation.  Anticoagulated with apixaban.  Heart rate  stable on Toprol. 3. Diabetes.  Patient was having hypoglycemic episodes on arrival, which was likely contributing to #1.  Blood sugars have since stabilized.  We have reduced her home glipizide by 50% and would discontinue it if she has any recurrence of low blood sugar.   4. Hypothyroidism.  Continue on Synthroid 5. Chronic pain syndrome.  Medications currently on hold.  Did not restart any opioids on discharge.  Patient will need referral to pain management clinic as an outpatient since she is taking large amounts of pain medications. 6. History of CVA in the past.  She has residual left-sided weakness.  Physical therapy has evaluated the patient recommend skilled nursing facility placement.  She has been started on low dose keppra for stabilization.  7. AKI.  Likely related to poor p.o. intake.  Improving with IV fluids. 8. Urinary retention.  Found to have greater than 600 cc on bladder scan with poor urine output.  Foley catheter placed temporarily in hospital and now has been discontinued and patient has been able to void several times.    DVT prophylaxis: eliquis Code Status: Full code Family Communication: Discussed with husband at the bedside Disposition Plan: Physical therapy has recommended skilled nursing facility placement.  Patient is agreeable for placement.  Social work is working on bed placement  Discharge Diagnoses:  Principal Problem:   Weakness Active Problems:   Hypoglycemia   H/O: CVA (cerebrovascular accident)   Chronic pain   DM type 2 (diabetes mellitus, type 2) (HCC)   Benign essential HTN   Acute metabolic encephalopathy   Atrial fibrillation, chronic (HCC)   Chronic anticoagulation  Discharge Instructions: Discharge Instructions    Call MD for:  difficulty breathing, headache or visual disturbances   Complete by:  As directed    Call MD for:  extreme fatigue   Complete by:  As directed    Call MD for:  persistant dizziness or light-headedness   Complete by:  As directed    Call MD for:  persistant nausea and vomiting   Complete by:  As directed    Call MD for:  severe uncontrolled pain   Complete by:  As directed    Diet - low sodium heart healthy   Complete by:  As directed    Increase activity slowly   Complete  by:  As directed      Allergies as of 11/19/2017      Reactions   Naproxen Other (See Comments)   Causes stomach burning      Medication List    STOP taking these medications   diazepam 10 MG tablet Commonly known as:  VALIUM   fluconazole 100 MG tablet Commonly known as:  DIFLUCAN   furosemide 40 MG tablet Commonly known as:  LASIX   hydrochlorothiazide 25 MG tablet Commonly known as:  HYDRODIURIL   HYDROcodone-acetaminophen 10-325 MG tablet Commonly known as:  NORCO   levofloxacin 500 MG tablet Commonly known as:  LEVAQUIN   LINZESS 72 MCG capsule Generic drug:  linaclotide   MELATONIN PO   pioglitazone 15 MG tablet Commonly known as:  ACTOS   temazepam 30 MG capsule Commonly known as:  RESTORIL   traZODone 50 MG tablet Commonly known as:  DESYREL     TAKE these medications   acetaminophen 325 MG tablet Commonly known as:  TYLENOL Take 2 tablets (650 mg total) by mouth every 6 (six) hours as needed for mild pain, moderate pain or headache.   apixaban 5 MG  Tabs tablet Commonly known as:  ELIQUIS Take 1 tablet (5 mg total) by mouth 2 (two) times daily. Restart on 7/14 What changed:  additional instructions   atorvastatin 80 MG tablet Commonly known as:  LIPITOR Take 80 mg by mouth daily.   enalapril 20 MG tablet Commonly known as:  VASOTEC Take 20 mg by mouth 2 (two) times daily.   escitalopram 10 MG tablet Commonly known as:  LEXAPRO Take 1 tablet (10 mg total) by mouth daily. What changed:    medication strength  how much to take  when to take this   glipiZIDE 5 MG tablet Commonly known as:  GLUCOTROL Take 1 tablet (5 mg total) by mouth daily before breakfast. In the AM What changed:    medication strength  how much to take  when to take this   levETIRAcetam 250 MG tablet Commonly known as:  KEPPRA Take 1 tablet (250 mg total) by mouth 2 (two) times daily.   levothyroxine 25 MCG tablet Commonly known as:  SYNTHROID,  LEVOTHROID Take 25 mcg by mouth daily.   metFORMIN 500 MG 24 hr tablet Commonly known as:  GLUCOPHAGE-XR Take 1,000 mg by mouth 2 (two) times daily.   metoprolol succinate 50 MG 24 hr tablet Commonly known as:  TOPROL-XL Take 50 mg by mouth daily with lunch.   pantoprazole 40 MG tablet Commonly known as:  PROTONIX TAKE (1) TABLET BY MOUTH TWICE DAILY BEFORE A MEAL.   VITAMIN D-3 PO Take 1 tablet by mouth daily.      Follow-up Information    [name], MD. Schedule an appointment as soon as possible for a visit in 2 week(s).   Specialty:  Family Medicine Contact information: [address] [place] [address] [phone]        Pricilla Riffle, MD. Schedule an appointment as soon as possible for a visit in 3 week(s).   Specialty:  Cardiology Contact information: [address] [place] [address] [phone]        Beryle Beams, MD. Schedule an appointment as soon as possible for a visit in 1 month(s).   Specialty:  Neurology Why:  Hospital Follow Up  Contact information: [address] [place] [address] [phone]          Allergies  Allergen Reactions  . Naproxen Other (See Comments)    Causes stomach burning   Allergies as of 11/19/2017      Reactions   Naproxen Other (See Comments)   Causes stomach burning      Medication List    STOP taking these medications   diazepam 10 MG tablet Commonly known as:  VALIUM   fluconazole 100 MG tablet Commonly known as:  DIFLUCAN   furosemide 40 MG tablet Commonly known as:  LASIX   hydrochlorothiazide 25 MG tablet Commonly known as:  HYDRODIURIL   HYDROcodone-acetaminophen 10-325 MG tablet Commonly known as:  NORCO   levofloxacin 500 MG tablet Commonly known as:  LEVAQUIN   LINZESS 72 MCG capsule Generic drug:  linaclotide   MELATONIN PO   pioglitazone 15 MG tablet Commonly known as:  ACTOS   temazepam 30 MG capsule Commonly known as:  RESTORIL    traZODone 50 MG tablet Commonly known as:  DESYREL     TAKE these medications   acetaminophen 325 MG tablet Commonly known as:  TYLENOL Take 2 tablets (650 mg total) by mouth every 6 (six) hours as needed for mild pain, moderate pain or  headache.   apixaban 5 MG Tabs tablet Commonly known as:  ELIQUIS Take 1 tablet (5 mg total) by mouth 2 (two) times daily. Restart on 7/14 What changed:  additional instructions   atorvastatin 80 MG tablet Commonly known as:  LIPITOR Take 80 mg by mouth daily.   enalapril 20 MG tablet Commonly known as:  VASOTEC Take 20 mg by mouth 2 (two) times daily.   escitalopram 10 MG tablet Commonly known as:  LEXAPRO Take 1 tablet (10 mg total) by mouth daily. What changed:    medication strength  how much to take  when to take this   glipiZIDE 5 MG tablet Commonly known as:  GLUCOTROL Take 1 tablet (5 mg total) by mouth daily before breakfast. In the AM What changed:    medication strength  how much to take  when to take this   levETIRAcetam 250 MG tablet Commonly known as:  KEPPRA Take 1 tablet (250 mg total) by mouth 2 (two) times daily.   levothyroxine 25 MCG tablet Commonly known as:  SYNTHROID, LEVOTHROID Take 25 mcg by mouth daily.   metFORMIN 500 MG 24 hr tablet Commonly known as:  GLUCOPHAGE-XR Take 1,000 mg by mouth 2 (two) times daily.   metoprolol succinate 50 MG 24 hr tablet Commonly known as:  TOPROL-XL Take 50 mg by mouth daily with lunch.   pantoprazole 40 MG tablet Commonly known as:  PROTONIX TAKE (1) TABLET BY MOUTH TWICE DAILY BEFORE A MEAL.   VITAMIN D-3 PO Take 1 tablet by mouth daily.       Procedures/Studies: Ct Head Wo Contrast  Result Date: 11/14/2017 CLINICAL DATA:  66 year old female with a history of prior stroke. Left-sided weakness. EXAM: CT HEAD WITHOUT CONTRAST TECHNIQUE: Contiguous axial images were obtained from the base of the skull through the vertex without intravenous contrast.  COMPARISON:  None. FINDINGS: Brain: No acute intracranial hemorrhage. No midline shift or mass effect. Redemonstration of encephalomalacia of the right temporal region, frontal region, and the anterior limb of the internal capsule. Associated asymmetry of the cerebral peduncle, suggesting wallerian degeneration. These are unchanged from the prior. Gray-white differentiation is maintained on the current. Mild patchy hypodensity in the periventricular white matter. Vascular: Calcifications of the anterior circulation Skull: No acute fracture. Lucent regions of the occipital bone are unchanged dating to 2017. Sinuses/Orbits: No paranasal sinus disease. Unremarkable appearance of the orbits. Other: None IMPRESSION: Head CT negative for acute intracranial abnormality. Redemonstration of encephalomalacia of the right frontotemporal region as well and the associated anterior limb of the internal capsule. Electronically Signed   By: Gilmer Mor D.O.   On: 11/14/2017 15:28   Mr Brain Wo Contrast  Result Date: 11/14/2017 CLINICAL DATA:  67 y/o  F; 2 days of left-sided weakness. EXAM: MRI HEAD WITHOUT CONTRAST TECHNIQUE: Axial DWI, coronal DWI, sagittal T1, axial T2, axial T2 FLAIR sequences were acquired. Patient unable to hold still. COMPARISON:  11/14/2017 CT head.  09/28/2015 MRI head. FINDINGS: Brain: No acute infarction, hemorrhage, hydrocephalus, extra-axial collection or mass lesion. Stable chronic infarction involving the left lentiform nucleus, insula, and perisylvian frontal and temporal lobes. Wallerian degeneration of the right cerebral peduncle extending into brainstem. Motion degradation of several sequences. Vascular: Normal flow voids. Skull and upper cervical spine: Normal marrow signal. Sinuses/Orbits: Negative. Other: None. IMPRESSION: 1. Chronic infarction involving right perisylvian frontal and temporal lobes as well as insula and lentiform nucleus. Wallerian degeneration of the right cerebral  peduncle extending into brainstem. 2. No  acute intracranial abnormality identified. Electronically Signed   By: Mitzi Hansen M.D.   On: 11/14/2017 17:28     Subjective:   Discharge Exam: Vitals:   11/18/17 2338 11/19/17 0652  BP: 104/81 108/65  Pulse:  93  Resp:  20  Temp:  98.5 F (36.9 C)  SpO2:  99%   Vitals:   11/18/17 2211 11/18/17 2338 11/19/17 0652 11/19/17 0653  BP: 104/81 104/81 108/65   Pulse: 82  93   Resp: 20  20   Temp: 98.3 F (36.8 C)  98.5 F (36.9 C)   TempSrc: Oral  Oral   SpO2: 98%  99%   Weight:    92.5 kg (203 lb 14.8 oz)    General: Pt is alert, awake, not in acute distress Cardiovascular: RRR, S1/S2 +, no rubs, no gallops Respiratory: CTA bilaterally, no wheezing, no rhonchi Abdominal: Soft, NT, ND, bowel sounds + Extremities: no edema, no cyanosis   The results of significant diagnostics from this hospitalization (including imaging, microbiology, ancillary and laboratory) are listed below for reference.     Microbiology: No results found for this or any previous visit (from the past 240 hour(s)).   Labs: BNP (last 3 results) Recent Labs    09/14/17 1302  BNP 476.0*   Basic Metabolic Panel: Recent Labs  Lab 11/14/17 1357 11/14/17 1401 11/15/17 0436 11/16/17 0557 11/17/17 0720 11/19/17 0613  NA 141 140 140 137 139 140  K 4.1 4.2 4.1 4.0 4.4 2.8*  CL 101 101 101 95* 103 99*  CO2 28  --  26 24 23 29   GLUCOSE 77 74 196* 215* 155* 145*  BUN 37* 33* 25* 18 32* 19  CREATININE 1.58* 1.50* 1.11* 0.94 1.19* 0.82  CALCIUM 10.1  --  10.3 10.0 9.2 9.2   Liver Function Tests: Recent Labs  Lab 11/14/17 1357  AST 21  ALT 14  ALKPHOS 119  BILITOT 0.5  PROT 7.7  ALBUMIN 3.7   No results for input(s): LIPASE, AMYLASE in the last 168 hours. Recent Labs  Lab 11/15/17 1908  AMMONIA 24   CBC: Recent Labs  Lab 11/14/17 1357 11/14/17 1401 11/15/17 0436 11/16/17 0557 11/17/17 0720 11/19/17 0613  WBC 5.9  --  5.5  13.2* 12.1* 10.4  NEUTROABS 3.9  --   --   --   --   --   HGB 11.7* 12.2 13.0 16.6* 14.3 13.3  HCT 36.0 36.0 39.9 47.7* 41.9 39.3  MCV 95.0  --  94.5 91.2 90.9 91.0  PLT 218  --  235 235 251 237   Cardiac Enzymes: No results for input(s): CKTOTAL, CKMB, CKMBINDEX, TROPONINI in the last 168 hours. BNP: Invalid input(s): POCBNP CBG: Recent Labs  Lab 11/18/17 0742 11/18/17 1118 11/18/17 1625 11/18/17 2210 11/19/17 0746  GLUCAP 139* 130* 100* 107* 123*   D-Dimer No results for input(s): DDIMER in the last 72 hours. Hgb A1c No results for input(s): HGBA1C in the last 72 hours. Lipid Profile No results for input(s): CHOL, HDL, LDLCALC, TRIG, CHOLHDL, LDLDIRECT in the last 72 hours. Thyroid function studies No results for input(s): TSH, T4TOTAL, T3FREE, THYROIDAB in the last 72 hours.  Invalid input(s): FREET3 Anemia work up No results for input(s): VITAMINB12, FOLATE, FERRITIN, TIBC, IRON, RETICCTPCT in the last 72 hours. Urinalysis    Component Value Date/Time   COLORURINE YELLOW 11/14/2017 1435   APPEARANCEUR CLEAR 11/14/2017 1435   LABSPEC 1.011 11/14/2017 1435   PHURINE 5.0 11/14/2017 1435   GLUCOSEU  NEGATIVE 11/14/2017 1435   HGBUR NEGATIVE 11/14/2017 1435   BILIRUBINUR NEGATIVE 11/14/2017 1435   KETONESUR NEGATIVE 11/14/2017 1435   PROTEINUR NEGATIVE 11/14/2017 1435   UROBILINOGEN 0.2 03/08/2015 0740   NITRITE NEGATIVE 11/14/2017 1435   LEUKOCYTESUR NEGATIVE 11/14/2017 1435   Sepsis Labs Invalid input(s): PROCALCITONIN,  WBC,  LACTICIDVEN Microbiology No results found for this or any previous visit (from the past 240 hour(s)).  Time coordinating discharge: 35 minutes  SIGNED:  Standley Dakins, MD  [facility] 11/19/2017, 10:30 AM Pager [phone]  If 7PM-7AM, please contact night-coverage [email or web address] Password [email or web address]

## 2017-11-19 NOTE — Discharge Instructions (Signed)
1. Follow up with PCP in 2 weeks 2. Follow up with cardiology in 3 weeks 3. Follow up with neurology in 1 month  4. Please avoid sedative medications as much as possible. 5. Please monitor blood sugars AC and HS. Please discontinue glipizide for any blood sugar less than 90.  6. Please monitor for spontaneous voiding as foley was removed on 11/18/17.  Pt has voided several times in hospital since foley was removed. 7. Please refer patient to an outpatient pain management clinic if needed.  At this time advise holding all opioid medications.  8. Please obtain BMP/CBC in one week 9. Please follow up on the following pending results: Final culture data

## 2017-11-19 NOTE — Progress Notes (Signed)
Report called to [facility] at 2:45 PM. IV removed.

## 2017-12-13 ENCOUNTER — Encounter: Payer: Self-pay | Admitting: Student

## 2017-12-13 ENCOUNTER — Ambulatory Visit (INDEPENDENT_AMBULATORY_CARE_PROVIDER_SITE_OTHER): Payer: Medicare Other | Admitting: Student

## 2017-12-13 VITALS — BP 110/62 | HR 73 | Ht 62.0 in | Wt 198.0 lb

## 2017-12-13 DIAGNOSIS — I480 Paroxysmal atrial fibrillation: Secondary | ICD-10-CM

## 2017-12-13 DIAGNOSIS — Z79899 Other long term (current) drug therapy: Secondary | ICD-10-CM

## 2017-12-13 DIAGNOSIS — E782 Mixed hyperlipidemia: Secondary | ICD-10-CM | POA: Diagnosis not present

## 2017-12-13 DIAGNOSIS — I10 Essential (primary) hypertension: Secondary | ICD-10-CM | POA: Diagnosis not present

## 2017-12-13 DIAGNOSIS — Z7901 Long term (current) use of anticoagulants: Secondary | ICD-10-CM | POA: Diagnosis not present

## 2017-12-13 NOTE — Patient Instructions (Addendum)
Medication Instructions:  Your physician recommends that you continue on your current medications as directed. Please refer to the Current Medication list given to you today.   Labwork: NONE   Testing/Procedures: NONE   Follow-Up: Your physician wants you to follow-up in: 6 Months with Dr. Tenny Craw. You will receive a reminder letter in the mail two months in advance. If you don't receive a letter, please call our office to schedule the follow-up appointment.   Any Other Special Instructions Will Be Listed Below (If Applicable).  Make an Appointment with Dr. Sudie Bailey within the next week.    If you need a refill on your cardiac medications before your next appointment, please call your pharmacy. Thank you for choosing [facility]!

## 2017-12-13 NOTE — Progress Notes (Signed)
Cardiology Office Note    Date:  12/13/2017   ID:  Megan [name], MRN [id number]  PCP:  Megan Morgan, MD  Cardiologist: Megan Pates, MD    Chief Complaint  Patient presents with  . Hospitalization Follow-up    History of Present Illness:    Megan [name] is a 66 y.o. female with past medical history of PAF (on Eliquis), HTN, HLD, Type II DM, and prior CVA who presents to the office today for hospital follow-up.  She was last examined by Dr. Tenny [name] on 10/26/2017 reporting having an episode of chest discomfort approximately 1 month ago which had lasted for approximately 10 minutes and spontaneously resolved. A Coronary CT was recommended for further evaluation but has not yet been obtained.  In the interim, she was admitted to [facility] from 11/14/2017 to 11/19/2017 for evaluation of worsening left-sided weakness and slurred speech. Head CT and MRI were performed but showed no acute findings. She had been on multiple sedating medications prior to admission and her benzodiazepines were weaned during admission.  She was continued on Eliquis for anticoagulation along with Toprol-XL during admission. Evaluated by PT and was discharged to SNF for rehabilitation.   In talking with the patient and her husband today, she was just discharged from SNF last week. She reports overall progressing well at home and continues to have improvement in her energy level. She denies any specific chest pain, palpitations, orthopnea, PND, or lower extremity edema. In reviewing medications, HCTZ and Lasix were discontinued during admission secondary to an AKI and urinary retention and she denies any evidence of volume overload with these medication changes.  The patient and her husband have multiple concerns about her depression medications which were adjusted during admission and while at SNF. She has follow-up with her PCP next week to further discuss these issues.   Past Medical History:    Diagnosis Date  . Agoraphobia with panic disorder   . Anxiety   . Arthritis   . Chronic pain 03/08/15   Hydrocodone-APAP 10/325 mg, # 120 q month  . Colon polyps   . Complication of anesthesia   . Depression   . DM (diabetes mellitus) (HCC)   . GERD (gastroesophageal reflux disease)   . HTN (hypertension)   . Hypothyroidism   . Obesity   . PAF (paroxysmal atrial fibrillation) (HCC)   . PONV (postoperative nausea and vomiting)   . Stroke (HCC) 08/20/2014   left sided weakness; some memory loss.    Past Surgical History:  Procedure Laterality Date  . ABDOMINAL HYSTERECTOMY  15 yrs ago   with bso at aph  . CARDIAC CATHETERIZATION  2004   normal  . CHOLECYSTECTOMY  25 yrs ago   [facility]  . COLONOSCOPY  05/04/11   left-sided colonic diverticulosis  . COLONOSCOPY N/A 01/27/2016   diverticulosis in sigmoid colon and descending colon, active bleeding Dieulafoy lesion s/p clips X 3, next colonoscopy in 5 years due to family history of colon cancer    . ESOPHAGOGASTRODUODENOSCOPY  05/04/2011   Abnormal distal esophagus, biopsies consistent with inflammation due to acid reflux. No evidence of Barrett's. No H. pylori  . ESOPHAGOGASTRODUODENOSCOPY N/A 01/22/2016   erosive gastropathy, non-bleeding gastric ulcers and 1 oozing gastric ulcer with adherent clot s/p heater probe. Negative H.pylori serology.   . ESOPHAGOGASTRODUODENOSCOPY (EGD) WITH PROPOFOL N/A 04/27/2016   Dr. Jena [name]: healed ulcers. normal esophagus  . RADIOLOGY WITH ANESTHESIA N/A 08/20/2015   Procedure: RADIOLOGY WITH ANESTHESIA;  Surgeon: Megan Cotton, MD;  Location: [facility] OR;  Service: Radiology;  Laterality: N/A;    Current Medications: Outpatient Medications Prior to Visit  Medication Sig Dispense Refill  . acetaminophen (TYLENOL) 325 MG tablet Take 2 tablets (650 mg total) by mouth every 6 (six) hours as needed for mild pain, moderate pain or headache.    Marland Kitchen apixaban (ELIQUIS) 5 MG TABS tablet Take 1 tablet (5 mg total)  by mouth 2 (two) times daily. Restart on 7/14 (Patient taking differently: Take 5 mg by mouth 2 (two) times daily. ) 60 tablet 2  . atorvastatin (LIPITOR) 80 MG tablet Take 80 mg by mouth daily.    . Cholecalciferol (VITAMIN D-3 PO) Take 1 tablet by mouth daily.    . enalapril (VASOTEC) 20 MG tablet Take 20 mg by mouth 2 (two) times daily.     Marland Kitchen escitalopram (LEXAPRO) 10 MG tablet Take 1 tablet (10 mg total) by mouth daily.    Marland Kitchen glipiZIDE (GLUCOTROL) 5 MG tablet Take 1 tablet (5 mg total) by mouth daily before breakfast. In the AM    . levETIRAcetam (KEPPRA) 250 MG tablet Take 1 tablet (250 mg total) by mouth 2 (two) times daily.    Marland Kitchen levothyroxine (SYNTHROID, LEVOTHROID) 25 MCG tablet Take 25 mcg by mouth daily.    . metFORMIN (GLUCOPHAGE-XR) 500 MG 24 hr tablet Take 1,000 mg by mouth 2 (two) times daily.    . metoprolol (TOPROL-XL) 50 MG 24 hr tablet Take 50 mg by mouth daily with lunch.     . pantoprazole (PROTONIX) 40 MG tablet TAKE (1) TABLET BY MOUTH TWICE DAILY BEFORE A MEAL. 180 tablet 1   No facility-administered medications prior to visit.      Allergies:   Naproxen   Social History   Socioeconomic History  . Marital status: Married    Spouse name: Not on file  . Number of children: 3  . Years of education: Not on file  . Highest education level: Not on file  Occupational History  . Occupation: disability    Employer: NOT EMPLOYED  Social Needs  . Financial resource strain: Not on file  . Food insecurity:    Worry: Not on file    Inability: Not on file  . Transportation needs:    Medical: Not on file    Non-medical: Not on file  Tobacco Use  . Smoking status: Former Smoker    Packs/day: 0.25    Years: 15.00    Pack years: 3.75    Types: Cigarettes    Start date: 07/17/1969    Last attempt to quit: 07/18/1971    Years since quitting: 46.4  . Smokeless tobacco: Former Neurosurgeon    Types: Snuff    Quit date: 08/20/2014  . Tobacco comment: never more than 1-2 cig/day in  entire life  Substance and Sexual Activity  . Alcohol use: No    Alcohol/week: 0.0 oz  . Drug use: No  . Sexual activity: Yes    Birth control/protection: Surgical  Lifestyle  . Physical activity:    Days per week: Not on file    Minutes per session: Not on file  . Stress: Not on file  Relationships  . Social connections:    Talks on phone: Not on file    Gets together: Not on file    Attends religious service: Not on file    Active member of club or organization: Not on file    Attends meetings of clubs or  organizations: Not on file    Relationship status: Not on file  Other Topics Concern  . Not on file  Social History Narrative   No contact with children since they left home at age 21. Doesn't know where they are.     Family History:  [name] family history includes Colon cancer in her father, paternal aunt, paternal uncle, sister, and unknown relative.   Review of Systems:   Please see the history of present illness.     General:  No chills, fever, night sweats or weight changes. Positive for fatigue.  Cardiovascular:  No chest pain, dyspnea on exertion, edema, orthopnea, palpitations, paroxysmal nocturnal dyspnea. Dermatological: No rash, lesions/masses Respiratory: No cough, dyspnea Urologic: No hematuria, dysuria Abdominal:   No nausea, vomiting, diarrhea, bright red blood per rectum, melena, or hematemesis Neurologic:  No visual changes, wkns, changes in mental status. All other systems reviewed and are otherwise negative except as noted above.   Physical Exam:    VS:  BP 110/62 (BP Location: Left Arm)   Pulse 73   Ht  (1.575 m)   Wt 198 lb (89.8 kg)   SpO2 96%   BMI 36.21 kg/m    General: Well developed, well nourished Caucasian female appearing in no acute distress. Head: Normocephalic, atraumatic, sclera non-icteric, no xanthomas, nares are without discharge.  Neck: No carotid bruits. JVD not elevated.  Lungs: Respirations regular and  unlabored, without wheezes or rales.  Heart: Regular rate and rhythm. No S3 or S4.  No murmur, no rubs, or gallops appreciated. Abdomen: Soft, non-tender, non-distended with normoactive bowel sounds. No hepatomegaly. No rebound/guarding. No obvious abdominal masses. Msk:  Strength and tone appear normal for age. No joint deformities or effusions. Extremities: No clubbing or cyanosis. No lower extremity edema.  Distal pedal pulses are 2+ bilaterally. Neuro: Alert and oriented X 3. Moves all extremities spontaneously. No focal deficits noted. Psych:  Responds to questions appropriately with a normal affect. Skin: No rashes or lesions noted  Wt Readings from Last 3 Encounters:  12/13/17 198 lb (89.8 kg)  11/19/17 203 lb 14.8 oz (92.5 kg)  10/26/17 218 lb (98.9 kg)     Studies/Labs Reviewed:   EKG:  EKG is not ordered today.   Recent Labs: 09/14/2017: B Natriuretic Peptide 476.0 10/26/2017: TSH 0.41 11/14/2017: ALT 14 11/19/2017: BUN 19; Creatinine, Ser 0.82; Hemoglobin 13.3; Platelets 237; Potassium 2.8; Sodium 140   Lipid Panel    Component Value Date/Time   CHOL 232 (H) 10/26/2017 0953   TRIG 156 (H) 10/26/2017 0953   HDL 52 10/26/2017 0953   CHOLHDL 4.5 10/26/2017 0953   VLDL 17 08/21/2015 0435   LDLCALC 151 (H) 10/26/2017 0953    Additional studies/ records that were reviewed today include:   Echocardiogram: 10/10/2017 Study Conclusions  - Left ventricle: The cavity size was normal. Wall thickness was   normal. Systolic function was normal. The estimated ejection   fraction was in the range of 55% to 60%. The study is not   technically sufficient to allow evaluation of LV diastolic   function. - Aortic valve: Valve area (VTI): 2.25 cm^2. Valve area (Vmax): 2.6   cm^2. - Mitral valve: There was mild regurgitation. - Pulmonary arteries: Systolic pressure was mildly increased. PA   peak pressure: 38 mm Hg (S). - Technically difficult study.  Assessment:    1. Paroxysmal  atrial fibrillation (HCC)   2. Current use of long term anticoagulation   3. Essential hypertension  4. Mixed hyperlipidemia   5. Encounter for medication management      Plan:   In order of problems listed above:  1. Paroxysmal Atrial Fibrillation/ Use of Long-Term Anticoagulation - The patient has a known history of paroxysmal atrial fibrillation and appears to have remained in normal sinus rhythm during her recent admission by review of records. She denies any recent palpitations or changes in her respiratory status. Heart rate is well controlled in the 70's today, therefore will remain on Toprol-XL 50 mg daily for rate control. - She denies any evidence of active bleeding. Remains on Eliquis for anticoagulation.  2. HTN - BP is well controlled at 110/62 during today's visit. - Continue current medication regimen.  3. HLD - FLP from 10/26/2017 showed total cholesterol 232, HDL 52, triglycerides 156, and LDL 151. Not at goal of LDL less than 70 and Lipitor was further titrated from 20 mg daily to 80 mg daily at that time. She will need a repeat FLP and LFT's in 8 weeks.  4. Medication Management - The patient has multiple concerns regarding her depression medications which were stopped during her hospitalization and some were restarted while at  SNF. The patient does not have her medications with her today and is unsure what she is currently taking. I did print a copy of the AVS from her hospital discharge to give to them to hopefully provide some clarification. She reports having follow-up with her PCP next week and I recommended she take her current medications with her to that visit.   Medication Adjustments/Labs and Tests Ordered: Current medicines are reviewed at length with the patient today.  Concerns regarding medicines are outlined above.  Medication changes, Labs and Tests ordered today are listed in the Patient Instructions below. Patient Instructions  Medication  Instructions:  Your physician recommends that you continue on your current medications as directed. Please refer to the Current Medication list given to you today.  Labwork: NONE   Testing/Procedures: NONE   Follow-Up: Your physician wants you to follow-up in: 6 Months with Dr. Tenny [name]. You will receive a reminder letter in the mail two months in advance. If you don't receive a letter, please call our office to schedule the follow-up appointment.  Any Other Special Instructions Will Be Listed Below (If Applicable).  Make an Appointment with Dr. Sudie Bailey within the next week.   If you need a refill on your cardiac medications before your next appointment, please call your pharmacy. Thank you for choosing [facility]!     Signed, Ellsworth Lennox, PA-C  12/13/2017 4:53 PM     [facility] [address] [place], [place] [address] Phone: [phone]

## 2017-12-20 ENCOUNTER — Ambulatory Visit (INDEPENDENT_AMBULATORY_CARE_PROVIDER_SITE_OTHER): Payer: Medicare Other | Admitting: Gastroenterology

## 2017-12-20 ENCOUNTER — Encounter: Payer: Self-pay | Admitting: Gastroenterology

## 2017-12-20 ENCOUNTER — Other Ambulatory Visit: Payer: Self-pay

## 2017-12-20 ENCOUNTER — Inpatient Hospital Stay (HOSPITAL_COMMUNITY)
Admission: EM | Admit: 2017-12-20 | Discharge: 2017-12-21 | DRG: 092 | Disposition: A | Discharge status: Home Health | Payer: Medicare Other | Source: Ambulatory Visit | Attending: Family Medicine | Admitting: Family Medicine

## 2017-12-20 ENCOUNTER — Other Ambulatory Visit: Payer: Self-pay | Admitting: *Deleted

## 2017-12-20 ENCOUNTER — Encounter (HOSPITAL_COMMUNITY): Payer: Self-pay | Admitting: Emergency Medicine

## 2017-12-20 ENCOUNTER — Emergency Department (HOSPITAL_COMMUNITY): Payer: Medicare Other

## 2017-12-20 DIAGNOSIS — Z79890 Hormone replacement therapy (postmenopausal): Secondary | ICD-10-CM

## 2017-12-20 DIAGNOSIS — Z7901 Long term (current) use of anticoagulants: Secondary | ICD-10-CM

## 2017-12-20 DIAGNOSIS — Z9119 Patient's noncompliance with other medical treatment and regimen: Secondary | ICD-10-CM

## 2017-12-20 DIAGNOSIS — F4001 Agoraphobia with panic disorder: Secondary | ICD-10-CM | POA: Diagnosis present

## 2017-12-20 DIAGNOSIS — E875 Hyperkalemia: Secondary | ICD-10-CM | POA: Diagnosis present

## 2017-12-20 DIAGNOSIS — I482 Chronic atrial fibrillation, unspecified: Secondary | ICD-10-CM | POA: Diagnosis present

## 2017-12-20 DIAGNOSIS — I69354 Hemiplegia and hemiparesis following cerebral infarction affecting left non-dominant side: Secondary | ICD-10-CM | POA: Diagnosis not present

## 2017-12-20 DIAGNOSIS — K219 Gastro-esophageal reflux disease without esophagitis: Secondary | ICD-10-CM | POA: Diagnosis not present

## 2017-12-20 DIAGNOSIS — Z8711 Personal history of peptic ulcer disease: Secondary | ICD-10-CM

## 2017-12-20 DIAGNOSIS — I10 Essential (primary) hypertension: Secondary | ICD-10-CM | POA: Diagnosis present

## 2017-12-20 DIAGNOSIS — T424X5A Adverse effect of benzodiazepines, initial encounter: Secondary | ICD-10-CM | POA: Diagnosis not present

## 2017-12-20 DIAGNOSIS — Z886 Allergy status to analgesic agent status: Secondary | ICD-10-CM

## 2017-12-20 DIAGNOSIS — R410 Disorientation, unspecified: Secondary | ICD-10-CM | POA: Insufficient documentation

## 2017-12-20 DIAGNOSIS — Z8719 Personal history of other diseases of the digestive system: Secondary | ICD-10-CM

## 2017-12-20 DIAGNOSIS — I959 Hypotension, unspecified: Secondary | ICD-10-CM | POA: Diagnosis not present

## 2017-12-20 DIAGNOSIS — Z79899 Other long term (current) drug therapy: Secondary | ICD-10-CM

## 2017-12-20 DIAGNOSIS — E119 Type 2 diabetes mellitus without complications: Secondary | ICD-10-CM

## 2017-12-20 DIAGNOSIS — I952 Hypotension due to drugs: Secondary | ICD-10-CM | POA: Diagnosis present

## 2017-12-20 DIAGNOSIS — G894 Chronic pain syndrome: Secondary | ICD-10-CM | POA: Diagnosis present

## 2017-12-20 DIAGNOSIS — Z9049 Acquired absence of other specified parts of digestive tract: Secondary | ICD-10-CM

## 2017-12-20 DIAGNOSIS — G4710 Hypersomnia, unspecified: Secondary | ICD-10-CM | POA: Diagnosis not present

## 2017-12-20 DIAGNOSIS — Z6838 Body mass index (BMI) 38.0-38.9, adult: Secondary | ICD-10-CM

## 2017-12-20 DIAGNOSIS — Z87891 Personal history of nicotine dependence: Secondary | ICD-10-CM

## 2017-12-20 DIAGNOSIS — G92 Toxic encephalopathy: Secondary | ICD-10-CM | POA: Diagnosis not present

## 2017-12-20 DIAGNOSIS — N390 Urinary tract infection, site not specified: Secondary | ICD-10-CM | POA: Diagnosis present

## 2017-12-20 DIAGNOSIS — K279 Peptic ulcer, site unspecified, unspecified as acute or chronic, without hemorrhage or perforation: Secondary | ICD-10-CM

## 2017-12-20 DIAGNOSIS — Z800 Family history of malignant neoplasm of digestive organs: Secondary | ICD-10-CM

## 2017-12-20 DIAGNOSIS — Z86010 Personal history of colonic polyps: Secondary | ICD-10-CM

## 2017-12-20 DIAGNOSIS — Z90710 Acquired absence of both cervix and uterus: Secondary | ICD-10-CM

## 2017-12-20 DIAGNOSIS — E860 Dehydration: Secondary | ICD-10-CM | POA: Diagnosis present

## 2017-12-20 DIAGNOSIS — E669 Obesity, unspecified: Secondary | ICD-10-CM | POA: Diagnosis not present

## 2017-12-20 DIAGNOSIS — E039 Hypothyroidism, unspecified: Secondary | ICD-10-CM | POA: Diagnosis present

## 2017-12-20 DIAGNOSIS — Z7952 Long term (current) use of systemic steroids: Secondary | ICD-10-CM

## 2017-12-20 DIAGNOSIS — F329 Major depressive disorder, single episode, unspecified: Secondary | ICD-10-CM | POA: Diagnosis not present

## 2017-12-20 DIAGNOSIS — T40605A Adverse effect of unspecified narcotics, initial encounter: Secondary | ICD-10-CM | POA: Diagnosis present

## 2017-12-20 DIAGNOSIS — Z8673 Personal history of transient ischemic attack (TIA), and cerebral infarction without residual deficits: Secondary | ICD-10-CM

## 2017-12-20 DIAGNOSIS — K5909 Other constipation: Secondary | ICD-10-CM

## 2017-12-20 DIAGNOSIS — Z7984 Long term (current) use of oral hypoglycemic drugs: Secondary | ICD-10-CM

## 2017-12-20 DIAGNOSIS — G8929 Other chronic pain: Secondary | ICD-10-CM | POA: Diagnosis present

## 2017-12-20 DIAGNOSIS — Z90722 Acquired absence of ovaries, bilateral: Secondary | ICD-10-CM

## 2017-12-20 DIAGNOSIS — G929 Unspecified toxic encephalopathy: Secondary | ICD-10-CM | POA: Diagnosis present

## 2017-12-20 HISTORY — DX: Hypotension, unspecified: I95.9

## 2017-12-20 LAB — COMPREHENSIVE METABOLIC PANEL
ALK PHOS: 105 U/L (ref 38–126)
ALT: 13 U/L — ABNORMAL LOW (ref 14–54)
ANION GAP: 7 (ref 5–15)
AST: 19 U/L (ref 15–41)
Albumin: 3.2 g/dL — ABNORMAL LOW (ref 3.5–5.0)
BILIRUBIN TOTAL: 0.4 mg/dL (ref 0.3–1.2)
BUN: 27 mg/dL — ABNORMAL HIGH (ref 6–20)
CO2: 24 mmol/L (ref 22–32)
Calcium: 8.9 mg/dL (ref 8.9–10.3)
Chloride: 107 mmol/L (ref 101–111)
Creatinine, Ser: 1.73 mg/dL — ABNORMAL HIGH (ref 0.44–1.00)
GFR calc non Af Amer: 29 mL/min — ABNORMAL LOW (ref >60)
GFR, EST AFRICAN AMERICAN: 34 mL/min — AB (ref >60)
Glucose, Bld: 131 mg/dL — ABNORMAL HIGH (ref 65–99)
Potassium: 4.8 mmol/L (ref 3.5–5.1)
SODIUM: 138 mmol/L (ref 135–145)
TOTAL PROTEIN: 7 g/dL (ref 6.5–8.1)

## 2017-12-20 LAB — CBC WITH DIFFERENTIAL/PLATELET
BASOS ABS: 0 10*3/uL (ref 0.0–0.1)
BASOS PCT: 0 %
BASOS PCT: 0 %
Basophils Absolute: 0 10*3/uL (ref 0.0–0.1)
EOS ABS: 0.3 10*3/uL (ref 0.0–0.7)
Eosinophils Absolute: 0.3 10*3/uL (ref 0.0–0.7)
Eosinophils Relative: 6 %
Eosinophils Relative: 6 %
HCT: 36.1 % (ref 36.0–46.0)
HCT: 36.2 % (ref 36.0–46.0)
Hemoglobin: 11.2 g/dL — ABNORMAL LOW (ref 12.0–15.0)
Hemoglobin: 11.3 g/dL — ABNORMAL LOW (ref 12.0–15.0)
Lymphocytes Relative: 39 %
Lymphocytes Relative: 40 %
Lymphs Abs: 2.2 10*3/uL (ref 0.7–4.0)
Lymphs Abs: 2.2 10*3/uL (ref 0.7–4.0)
MCH: 30.4 pg (ref 26.0–34.0)
MCH: 30.8 pg (ref 26.0–34.0)
MCHC: 31.3 g/dL (ref 30.0–36.0)
MCHC: 30.9 g/dL (ref 30.0–36.0)
MCV: 98.4 fL (ref 78.0–100.0)
MCV: 98.1 fL (ref 78.0–100.0)
MONO ABS: 0.4 10*3/uL (ref 0.1–1.0)
MONOS PCT: 7 %
Monocytes Absolute: 0.3 10*3/uL (ref 0.1–1.0)
Monocytes Relative: 6 %
NEUTROS PCT: 49 %
Neutro Abs: 2.6 10*3/uL (ref 1.7–7.7)
Neutro Abs: 2.7 10*3/uL (ref 1.7–7.7)
Neutrophils Relative %: 47 %
PLATELETS: 155 10*3/uL (ref 150–400)
Platelets: 160 10*3/uL (ref 150–400)
RBC: 3.69 MIL/uL — AB (ref 3.87–5.11)
RBC: 3.67 MIL/uL — ABNORMAL LOW (ref 3.87–5.11)
RDW: 14.5 % (ref 11.5–15.5)
RDW: 14.6 % (ref 11.5–15.5)
WBC: 5.5 10*3/uL (ref 4.0–10.5)
WBC: 5.6 10*3/uL (ref 4.0–10.5)

## 2017-12-20 LAB — I-STAT CHEM 8, ED
BUN: 26 mg/dL — AB (ref 6–20)
BUN: 35 mg/dL — ABNORMAL HIGH (ref 6–20)
CHLORIDE: 107 mmol/L (ref 101–111)
CHLORIDE: 106 mmol/L (ref 101–111)
CREATININE: 1.6 mg/dL — AB (ref 0.44–1.00)
Calcium, Ion: 1.16 mmol/L (ref 1.15–1.40)
Calcium, Ion: 1.18 mmol/L (ref 1.15–1.40)
Creatinine, Ser: 1.6 mg/dL — ABNORMAL HIGH (ref 0.44–1.00)
GLUCOSE: 128 mg/dL — AB (ref 65–99)
GLUCOSE: 127 mg/dL — AB (ref 65–99)
HCT: 33 % — ABNORMAL LOW (ref 36.0–46.0)
HCT: 34 % — ABNORMAL LOW (ref 36.0–46.0)
HEMOGLOBIN: 11.6 g/dL — AB (ref 12.0–15.0)
Hemoglobin: 11.2 g/dL — ABNORMAL LOW (ref 12.0–15.0)
POTASSIUM: 4.8 mmol/L (ref 3.5–5.1)
POTASSIUM: 5.4 mmol/L — AB (ref 3.5–5.1)
Sodium: 139 mmol/L (ref 135–145)
Sodium: 139 mmol/L (ref 135–145)
TCO2: 23 mmol/L (ref 22–32)
TCO2: 24 mmol/L (ref 22–32)

## 2017-12-20 LAB — I-STAT TROPONIN, ED: TROPONIN I, POC: 0 ng/mL (ref 0.00–0.08)

## 2017-12-20 LAB — URINALYSIS, ROUTINE W REFLEX MICROSCOPIC
BILIRUBIN URINE: NEGATIVE
Glucose, UA: NEGATIVE mg/dL
Ketones, ur: NEGATIVE mg/dL
Nitrite: NEGATIVE
PROTEIN: NEGATIVE mg/dL
SPECIFIC GRAVITY, URINE: 1.008 (ref 1.005–1.030)
pH: 5 (ref 5.0–8.0)

## 2017-12-20 LAB — POC OCCULT BLOOD, ED: Fecal Occult Bld: NEGATIVE

## 2017-12-20 LAB — RAPID URINE DRUG SCREEN, HOSP PERFORMED
Amphetamines: NOT DETECTED
Barbiturates: NOT DETECTED
Benzodiazepines: POSITIVE — AB
COCAINE: NOT DETECTED
OPIATES: POSITIVE — AB
TETRAHYDROCANNABINOL: NOT DETECTED

## 2017-12-20 LAB — GLUCOSE, CAPILLARY
GLUCOSE-CAPILLARY: 146 mg/dL — AB (ref 65–99)
Glucose-Capillary: 86 mg/dL (ref 65–99)

## 2017-12-20 LAB — I-STAT CG4 LACTIC ACID, ED: Lactic Acid, Venous: 1.05 mmol/L (ref 0.5–1.9)

## 2017-12-20 LAB — MRSA PCR SCREENING: MRSA BY PCR: NEGATIVE

## 2017-12-20 MED ORDER — NALOXONE HCL 2 MG/2ML IJ SOSY
1.0000 mg | PREFILLED_SYRINGE | Freq: Once | INTRAMUSCULAR | Status: AC
  Administered 2017-12-20: 1 mg via INTRAVENOUS

## 2017-12-20 MED ORDER — ACETAMINOPHEN 325 MG PO TABS: 650.0000 mg | ORAL_TABLET | Freq: Four times a day (QID) | ORAL | Status: DC | PRN

## 2017-12-20 MED ORDER — ESCITALOPRAM OXALATE 10 MG PO TABS
10.0000 mg | ORAL_TABLET | Freq: Every day | ORAL | Status: DC
  Administered 2017-12-20 – 2017-12-21 (×2): 10 mg via ORAL
  Filled 2017-12-20 (×2): qty 1

## 2017-12-20 MED ORDER — APIXABAN 5 MG PO TABS
5.0000 mg | ORAL_TABLET | Freq: Two times a day (BID) | ORAL | Status: DC
  Administered 2017-12-20 – 2017-12-21 (×2): 5 mg via ORAL
  Filled 2017-12-20 (×2): qty 1

## 2017-12-20 MED ORDER — SODIUM CHLORIDE 0.9 % IV BOLUS
1000.0000 mL | Freq: Once | INTRAVENOUS | Status: AC
  Administered 2017-12-20: 1000 mL via INTRAVENOUS

## 2017-12-20 MED ORDER — SODIUM CHLORIDE 0.9 % IV SOLN
1.0000 g | Freq: Once | INTRAVENOUS | Status: AC
  Administered 2017-12-20: 1 g via INTRAVENOUS
  Filled 2017-12-20: qty 10

## 2017-12-20 MED ORDER — NALOXONE HCL 2 MG/2ML IJ SOSY
1.0000 mg | PREFILLED_SYRINGE | Freq: Once | INTRAMUSCULAR | Status: AC
  Administered 2017-12-20: 1 mg via INTRAVENOUS
  Filled 2017-12-20: qty 2

## 2017-12-20 MED ORDER — NALOXONE HCL 2 MG/2ML IJ SOSY
PREFILLED_SYRINGE | INTRAMUSCULAR | Status: AC
  Administered 2017-12-20: 1 mg via INTRAVENOUS
  Filled 2017-12-20: qty 2

## 2017-12-20 MED ORDER — SODIUM CHLORIDE 0.9 % IV BOLUS
2000.0000 mL | Freq: Once | INTRAVENOUS | Status: AC
  Administered 2017-12-20: 2000 mL via INTRAVENOUS

## 2017-12-20 MED ORDER — METOPROLOL SUCCINATE ER 50 MG PO TB24: 50.0000 mg | ORAL_TABLET | Freq: Every day | ORAL | Status: DC

## 2017-12-20 MED ORDER — PREDNISONE 10 MG PO TABS
5.0000 mg | ORAL_TABLET | Freq: Every day | ORAL | Status: DC
  Administered 2017-12-21: 5 mg via ORAL
  Filled 2017-12-20: qty 1

## 2017-12-20 MED ORDER — ACETAMINOPHEN 650 MG RE SUPP: 650.0000 mg | Freq: Four times a day (QID) | RECTAL | Status: DC | PRN

## 2017-12-20 MED ORDER — LEVETIRACETAM 250 MG PO TABS
250.0000 mg | ORAL_TABLET | Freq: Two times a day (BID) | ORAL | Status: DC
  Administered 2017-12-20 – 2017-12-21 (×2): 250 mg via ORAL
  Filled 2017-12-20 (×2): qty 1

## 2017-12-20 MED ORDER — CEFTRIAXONE SODIUM 1 G IJ SOLR
1.0000 g | INTRAMUSCULAR | Status: DC
Start: 2017-12-21 — End: 2017-12-21
  Filled 2017-12-20 (×3): qty 10

## 2017-12-20 MED ORDER — INSULIN ASPART 100 UNIT/ML ~~LOC~~ SOLN
0.0000 [IU] | Freq: Four times a day (QID) | SUBCUTANEOUS | Status: DC
  Administered 2017-12-20: 1 [IU] via SUBCUTANEOUS

## 2017-12-20 MED ORDER — PANTOPRAZOLE SODIUM 40 MG PO TBEC
40.0000 mg | DELAYED_RELEASE_TABLET | Freq: Two times a day (BID) | ORAL | Status: DC
  Administered 2017-12-20 – 2017-12-21 (×2): 40 mg via ORAL
  Filled 2017-12-20 (×2): qty 1

## 2017-12-20 MED ORDER — SODIUM CHLORIDE 0.9 % IV SOLN
INTRAVENOUS | Status: DC
  Administered 2017-12-20: 19:00:00 via INTRAVENOUS

## 2017-12-20 MED ORDER — LEVOTHYROXINE SODIUM 25 MCG PO TABS
25.0000 ug | ORAL_TABLET | Freq: Every day | ORAL | Status: DC
  Administered 2017-12-21: 25 ug via ORAL
  Filled 2017-12-20: qty 1

## 2017-12-20 NOTE — Progress Notes (Signed)
Referring Provider: Gareth MorganKnowlton, Steve, MD Primary Care Physician:  Gareth MorganKnowlton, Steve, MD Primary GI:  Dr. Jena Gaussourk   Chief Complaint  Patient presents with  . Peptic Ulcer Disease    HPI:   Megan [name] is a 67 y.o. female presenting today with a history of GI bleed in setting of Eliquis,with findings oferosive gastropathy, non-bleeding gastric ulcers and 1 oozing gastric ulcer with adherent clot on endoscopy. Brief hospitalization thereafter with colonoscopy revealing bleeding Dieulafoy's lesion s/p clips X 3, with next colonoscopy in 5 years due to family history of colon cancer.EGD (Oct 2017) for surveillance with healed gastric ulcer, normal esophagus  No overt GI bleeding. No abdominal pain. No constipation or diarrhea. Usually doesn't have a good appetite but did yesterday. Usually would rather drink liquids than eat. Feels tired all the time. Was inpatient in May 2019 with acute encephalopathy secondary to multiple sedative medications. Was in skilled nursing thereafter ([facility]). Now home.   She is falling asleep while I am talking with her. Husband states she does this intermittently at home but "it's better than when at [facility]". They show me a large bag filled with medications from [facility] that they were given when they were discharged and told "you paid for this, you can keep taking at home". Multiple medications and duplicates found in the bag and the "shoebox" that she is using for daily medications, which I have listed below. However, she is not even clear on the medications that she is taking daily, and states that she took the trazodone yesterday evening and "another sleeping pill that was black and white". She states she only took prednisone this morning, and that it is still on the sink. Prednisone was not in the shoebox with her. Husband states that he takes his medications and lets her take her own. She states she takes 1-2 Eliquis a day, but she has multiple bottles of this.  After further discussion, she states "they send me bottles before i'm ready for the next one".   BP 91/53 on arrival. Repeat 90/52. She tells me it is 2018 but quickly reorients. Appears at time of discharge on 5/6 that she was to stop diazepam, lasix, HCTZ, hydrocodone, levofloxacin, Linzess, melatonin, Actos, restoril, trazodone.  At some point while at [facility], this was changed. It is completely unclear what she is to be taking, and I am concerned for her safety. She states she is as well.    Past Medical History:  Diagnosis Date  . Agoraphobia with panic disorder   . Anxiety   . Arthritis   . Chronic pain 03/08/15   Hydrocodone-APAP 10/325 mg, # 120 q month  . Colon polyps   . Complication of anesthesia   . Depression   . DM (diabetes mellitus) (HCC)   . GERD (gastroesophageal reflux disease)   . HTN (hypertension)   . Hypothyroidism   . Obesity   . PAF (paroxysmal atrial fibrillation) (HCC)   . PONV (postoperative nausea and vomiting)   . Stroke (HCC) 08/20/2014   left sided weakness; some memory loss.    Past Surgical History:  Procedure Laterality Date  . ABDOMINAL HYSTERECTOMY  15 yrs ago   with bso at aph  . CARDIAC CATHETERIZATION  2004   normal  . CHOLECYSTECTOMY  25 yrs ago   [facility]  . COLONOSCOPY  05/04/11   left-sided colonic diverticulosis  . COLONOSCOPY N/A 01/27/2016   diverticulosis in sigmoid colon and descending colon, active bleeding Dieulafoy lesion s/p clips  X 3, next colonoscopy in 5 years due to family history of colon cancer    . ESOPHAGOGASTRODUODENOSCOPY  05/04/2011   Abnormal distal esophagus, biopsies consistent with inflammation due to acid reflux. No evidence of Barrett's. No H. pylori  . ESOPHAGOGASTRODUODENOSCOPY N/A 01/22/2016   erosive gastropathy, non-bleeding gastric ulcers and 1 oozing gastric ulcer with adherent clot s/p heater probe. Negative H.pylori serology.   . ESOPHAGOGASTRODUODENOSCOPY (EGD) WITH PROPOFOL N/A 04/27/2016   Dr. Jena Gauss:  healed ulcers. normal esophagus  . RADIOLOGY WITH ANESTHESIA N/A 08/20/2015   Procedure: RADIOLOGY WITH ANESTHESIA;  Surgeon: Julieanne Cotton, MD;  Location: [facility] OR;  Service: Radiology;  Laterality: N/A;    Outpatient medications in shoebox brought by patient: Lasix 40 mg 1-2 as needed daily Trazodone 50 mg each evening (at one point denied taking, then stated she does take) Glipizide 10 mg Protonix 40 mg BID Metoprolol 50 mg each morning Vit D 3 OTC Eliquis 5 mg BID (patient has multiple bottles of this, states she takes 1 or 2 a day, not sure).  States additional medications are at home on the sink (prednisone).  Large bag full of multiple medications including ativan brought from [facility], patient states "I was taking some of these last week but none this week".   Allergies as of 12/20/2017 - Review Complete 12/20/2017  Allergen Reaction Noted  . Naproxen Other (See Comments) 04/05/2011    Family History  Problem Relation Age of Onset  . Colon cancer Paternal Aunt        2, colon cancer  . Colon cancer Paternal Uncle        6, colon cancer  . Colon cancer Father        deceased, age 29  . Colon cancer Unknown        numerous cousins died before age 45 with colon cancer  . Colon cancer Sister        deceased, age 68  . Anesthesia problems Neg Hx   . Hypotension Neg Hx   . Malignant hyperthermia Neg Hx   . Pseudochol deficiency Neg Hx     Social History   Socioeconomic History  . Marital status: Married    Spouse name: Not on file  . Number of children: 3  . Years of education: Not on file  . Highest education level: Not on file  Occupational History  . Occupation: disability    Employer: NOT EMPLOYED  Social Needs  . Financial resource strain: Not on file  . Food insecurity:    Worry: Not on file    Inability: Not on file  . Transportation needs:    Medical: Not on file    Non-medical: Not on file  Tobacco Use  . Smoking status: Former Smoker    Packs/day:  0.25    Years: 15.00    Pack years: 3.75    Types: Cigarettes    Start date: 07/17/1969    Last attempt to quit: 07/18/1971    Years since quitting: 46.4  . Smokeless tobacco: Former Neurosurgeon    Types: Snuff    Quit date: 08/20/2014  . Tobacco comment: never more than 1-2 cig/day in entire life  Substance and Sexual Activity  . Alcohol use: No    Alcohol/week: 0.0 oz  . Drug use: No  . Sexual activity: Yes    Birth control/protection: Surgical  Lifestyle  . Physical activity:    Days per week: Not on file    Minutes per session: Not on  file  . Stress: Not on file  Relationships  . Social connections:    Talks on phone: Not on file    Gets together: Not on file    Attends religious service: Not on file    Active member of club or organization: Not on file    Attends meetings of clubs or organizations: Not on file    Relationship status: Not on file  Other Topics Concern  . Not on file  Social History Narrative   No contact with children since they left home at age 27. Doesn't know where they are.    Review of Systems: As mentioned in HPI   Physical Exam: BP (!) 91/53   Pulse 63   Temp (!) 96.7 F (35.9 C) (Oral)   Ht 5\' 2"  (1.575 m)   Wt 205 lb (93 kg)   BMI 37.49 kg/m FSBS 97 General:   Falling asleep during visit, awakens but then falls asleep again. Clear speech. Cooperative. Flat affect but laughing occasionally.  Head:  Normocephalic and atraumatic. Eyes:  Conjuctiva clear without scleral icterus. Mouth:  Oral mucosa pink and moist.  Abdomen:  +BS, soft, non-tender and non-distended. No rebound or guarding. No HSM or masses noted. Msk:  Symmetrical without gross deformities. Normal posture. Extremities:  Without edema.

## 2017-12-20 NOTE — ED Triage Notes (Signed)
Pt a/o. Mild grogginess noted. Pt seen GI today and they called us due to pt very sleepy. Pt just dc for polypharmacy the other week. Pt states all she remembers taking today was prednisone. Pt only complaint is she wants to go to sleep.

## 2017-12-20 NOTE — Progress Notes (Signed)
Pt requesting something to eat. Dr. Sharl MaLama paged. Verbal order to do bedside swallow screen. Pt not having any difficulty and tolerating well. Soft diet ordered.

## 2017-12-20 NOTE — Assessment & Plan Note (Signed)
67 year old female presenting today for routine follow-up with remote history of PUD but has been doing well from a GI standpoint. She was inpatient in May 2019 with acute encephalopathy secondary to polypharmacy and spent several weeks at [facility]. She is present with her husband today, with a large gift bag full of multiple medications and a shoebox with medications. It is entirely unclear what she is supposed to be taking, and she is hypotensive. She is drowsy and falls asleep while I am talking to her. From what I understand, she took trazodone yesterday evening per her report and another "sleeping pill". She only believes she took prednisone this morning. She is supposed to be on Eliquis chronically, and she has not been taking this appropriately. She admits to taking medications from the bag that was given to them by [facility] along with medications from the shoebox that she says is her "main medication", but she hasn't recently taken [facility] medications. I highly suspect she has taken medications inappropriately, as her husband does not monitor her intake. I have sent her to the ED as it is entirely unclear what is going on and what she has or has not ingested. We are making an ASAP referral to [facility].

## 2017-12-20 NOTE — H&P (Signed)
TRH H&P    Patient Demographics:    Megan [name], is a 67 y.o. female  MRN: [id number]  DOB - June 08, 1951  Admit Date - 12/20/2017  Referring MD/NP/PA: Dr. Estell Harpin  Outpatient Primary MD for the patient is [name], MD  Patient coming from: PCP office  Chief complaint-hypotension   HPI:    Megan [name]  is a 67 y.o. female, with history of CVA, chronic pain syndrome, depression, GERD, chronic left-sided weakness from previous stroke was sent to the ED from PCP office after patient was found to be hypotensive and lethargic.  Patient at this time is alert and awake and answering questions appropriately. She denies taking sedatives at home. Urine toxicology was positive for opiates and benzodiazepines She denies dysuria, fever or chills. Denies chest pain or shortness of breath. Denies nausea, vomiting or diarrhea In the ED patient got Narcan, ceftriaxone for UTI Urine and blood cultures have been obtained. Lactic acid is 1.05 Blood pressure has improved with IV fluids.   Review of systems:      All other systems reviewed and are negative.   With Past History of the following :    Past Medical History:  Diagnosis Date  . Agoraphobia with panic disorder   . Anxiety   . Arterial hypotension 12/20/2017  . Arthritis   . Chronic pain 03/08/15   Hydrocodone-APAP 10/325 mg, # 120 q month  . Colon polyps   . Complication of anesthesia   . Depression   . DM (diabetes mellitus) (HCC)   . GERD (gastroesophageal reflux disease)   . HTN (hypertension)   . Hypothyroidism   . Obesity   . PAF (paroxysmal atrial fibrillation) (HCC)   . PONV (postoperative nausea and vomiting)   . Stroke (HCC) 08/20/2014   left sided weakness; some memory loss.      Past Surgical History:  Procedure Laterality Date  . ABDOMINAL HYSTERECTOMY  15 yrs ago   with bso at aph  . CARDIAC CATHETERIZATION  2004    normal  . CHOLECYSTECTOMY  25 yrs ago   [facility]  . COLONOSCOPY  05/04/11   left-sided colonic diverticulosis  . COLONOSCOPY N/A 01/27/2016   diverticulosis in sigmoid colon and descending colon, active bleeding Dieulafoy lesion s/p clips X 3, next colonoscopy in 5 years due to family history of colon cancer    . ESOPHAGOGASTRODUODENOSCOPY  05/04/2011   Abnormal distal esophagus, biopsies consistent with inflammation due to acid reflux. No evidence of Barrett's. No H. pylori  . ESOPHAGOGASTRODUODENOSCOPY N/A 01/22/2016   erosive gastropathy, non-bleeding gastric ulcers and 1 oozing gastric ulcer with adherent clot s/p heater probe. Negative H.pylori serology.   . ESOPHAGOGASTRODUODENOSCOPY (EGD) WITH PROPOFOL N/A 04/27/2016   Dr. Jena Gauss: healed ulcers. normal esophagus  . RADIOLOGY WITH ANESTHESIA N/A 08/20/2015   Procedure: RADIOLOGY WITH ANESTHESIA;  Surgeon: Julieanne Cotton, MD;  Location: [facility] OR;  Service: Radiology;  Laterality: N/A;      Social History:      Social History   Tobacco Use  . Smoking status: Former Smoker  Packs/day: 0.25    Years: 15.00    Pack years: 3.75    Types: Cigarettes    Start date: 07/17/1969    Last attempt to quit: 07/18/1971    Years since quitting: 46.4  . Smokeless tobacco: Former Neurosurgeon    Types: Snuff    Quit date: 08/20/2014  . Tobacco comment: never more than 1-2 cig/day in entire life  Substance Use Topics  . Alcohol use: No    Alcohol/week: 0.0 oz       Family History :     Family History  Problem Relation Age of Onset  . Colon cancer Paternal Aunt        2, colon cancer  . Colon cancer Paternal Uncle        6, colon cancer  . Colon cancer Father        deceased, age 44  . Colon cancer Unknown        numerous cousins died before age 83 with colon cancer  . Colon cancer Sister        deceased, age 60  . Anesthesia problems Neg Hx   . Hypotension Neg Hx   . Malignant hyperthermia Neg Hx   . Pseudochol deficiency Neg Hx        Home Medications:   Prior to Admission medications   Medication Sig Start Date End Date Taking? Authorizing Provider  acetaminophen (TYLENOL) 325 MG tablet Take 2 tablets (650 mg total) by mouth every 6 (six) hours as needed for mild pain, moderate pain or headache. 11/19/17  Yes Johnson, Clanford L, MD  apixaban (ELIQUIS) 5 MG TABS tablet Take 1 tablet (5 mg total) by mouth 2 (two) times daily. Restart on 7/14 Patient taking differently: Take 5 mg by mouth 2 (two) times daily.  01/27/16  Yes Erick Blinks, MD  atorvastatin (LIPITOR) 80 MG tablet Take 80 mg by mouth daily.   Yes [provider]  enalapril (VASOTEC) 20 MG tablet Take 20 mg by mouth 2 (two) times daily.    Yes [provider]  escitalopram (LEXAPRO) 10 MG tablet Take 1 tablet (10 mg total) by mouth daily. Patient taking differently: Take 10 mg by mouth as needed.  11/19/17  Yes Johnson, Clanford L, MD  glipiZIDE (GLUCOTROL) 5 MG tablet Take 1 tablet (5 mg total) by mouth daily before breakfast. In the AM 11/19/17  Yes Johnson, Clanford L, MD  levETIRAcetam (KEPPRA) 250 MG tablet Take 1 tablet (250 mg total) by mouth 2 (two) times daily. 11/19/17  Yes Johnson, Clanford L, MD  levothyroxine (SYNTHROID, LEVOTHROID) 25 MCG tablet Take 25 mcg by mouth daily. 07/14/15  Yes [provider]  metFORMIN (GLUCOPHAGE-XR) 500 MG 24 hr tablet Take 1,000 mg by mouth 2 (two) times daily.   Yes [provider]  metoprolol (TOPROL-XL) 50 MG 24 hr tablet Take 50 mg by mouth daily with lunch.    Yes [provider]  pantoprazole (PROTONIX) 40 MG tablet TAKE (1) TABLET BY MOUTH TWICE DAILY BEFORE A MEAL. 10/05/17  Yes Anice Paganini, NP  predniSONE (DELTASONE) 5 MG tablet Take 5 mg by mouth daily.   Yes [provider]  Cholecalciferol (VITAMIN D-3 PO) Take 1 tablet by mouth daily.    [provider]  nitrofurantoin, macrocrystal-monohydrate, (MACROBID) 100 MG capsule Take 1 capsule by mouth 2 (two)  times daily. 12/08/17   [provider]     Allergies:     Allergies  Allergen Reactions  .  Naproxen Other (See Comments)    Causes stomach burning     Physical Exam:   Vitals  Blood pressure (!) 91/49, pulse 71, temperature (!) 97.1 F (36.2 C), temperature source Rectal, resp. rate 20, SpO2 98 %.  1.  General: Appears somnolent but arousable  2. Psychiatric:  Intact judgement and  insight, somnolent but arousable, oriented x 3.  3. Neurologic: No focal neurological deficits, all cranial nerves intact.moving all extremities  4. Eyes :  anicteric sclerae, moist conjunctivae with no lid lag. PERRLA.  5. ENMT:  Oropharynx clear with moist mucous membranes and good dentition  6. Neck:  supple, no cervical lymphadenopathy appriciated, No thyromegaly  7. Respiratory : Normal respiratory effort, good air movement bilaterally,clear to  auscultation bilaterally  8. Cardiovascular : RRR, no gallops, rubs or murmurs, trace edema of the lower extremities bilaterally  9. Gastrointestinal:  Positive bowel sounds, abdomen soft, non-tender to palpation,no hepatosplenomegaly, no rigidity or guarding       10. Skin:  No cyanosis, normal texture and turgor, no rash, lesions or ulcers  11.Musculoskeletal:  Good muscle tone,  joints appear normal , no effusions,  normal range of motion    Data Review:    CBC Recent Labs  Lab 12/20/17 1229 12/20/17 1232 12/20/17 1248  WBC 5.5  5.6  --   --   HGB 11.2*  11.3* 11.2* 11.6*  HCT 36.2  36.1 33.0* 34.0*  PLT 155  160  --   --   MCV 98.1  98.4  --   --   MCH 30.4  30.8  --   --   MCHC 30.9  31.3  --   --   RDW 14.6  14.5  --   --   LYMPHSABS 2.2  2.2  --   --   MONOABS 0.3  0.4  --   --   EOSABS 0.3  0.3  --   --   BASOSABS 0.0  0.0  --   --    ------------------------------------------------------------------------------------------------------------------  Chemistries  Recent Labs  Lab  12/20/17 1229 12/20/17 1232 12/20/17 1248  NA 138 139 139  K 4.8 4.8 5.4*  CL 107 107 106  CO2 24  --   --   GLUCOSE 131* 128* 127*  BUN 27* 26* 35*  CREATININE 1.73* 1.60* 1.60*  CALCIUM 8.9  --   --   AST 19  --   --   ALT 13*  --   --   ALKPHOS 105  --   --   BILITOT 0.4  --   --    ------------------------------------------------------------------------------------------------------------------  ------------------------------------------------------------------------------------------------------------------ GFR: Estimated Creatinine Clearance: 36.2 mL/min (A) (by C-G formula based on SCr of 1.6 mg/dL (H)). Liver Function Tests: Recent Labs  Lab 12/20/17 1229  AST 19  ALT 13*  ALKPHOS 105  BILITOT 0.4  PROT 7.0  ALBUMIN 3.2*    --------------------------------------------------------------------------------------------------------------- Urine analysis:    Component Value Date/Time   COLORURINE YELLOW 12/20/2017 1435   APPEARANCEUR CLOUDY (A) 12/20/2017 1435   LABSPEC 1.008 12/20/2017 1435   PHURINE 5.0 12/20/2017 1435   GLUCOSEU NEGATIVE 12/20/2017 1435   HGBUR SMALL (A) 12/20/2017 1435   BILIRUBINUR NEGATIVE 12/20/2017 1435   KETONESUR NEGATIVE 12/20/2017 1435   PROTEINUR NEGATIVE 12/20/2017 1435   UROBILINOGEN 0.2 03/08/2015 0740   NITRITE NEGATIVE 12/20/2017 1435   LEUKOCYTESUR TRACE (A) 12/20/2017 1435      Imaging Results:    Dg Chest Portable 1 View  Result Date:  12/20/2017 CLINICAL DATA:  Weakness.  Smoker. EXAM: PORTABLE CHEST 1 VIEW COMPARISON:  09/11/2017. FINDINGS: Borderline enlarged cardiac silhouette. Clear lungs with normal vascularity. Minimal peribronchial thickening with improvement. Unremarkable bones. IMPRESSION: Minimal bronchitic changes, with improvement. Electronically Signed   By: Beckie SaltsSteven  Reid M.D.   On: 12/20/2017 12:52    My personal review of EKG: Rhythm NSR   Assessment & Plan:    Active Problems:   UTI (urinary tract  infection)   1. Hypersomnolence-secondary to benzodiazepine and opiates.  Urine drug screen is positive for these medications with this patient denies taking Ativan she admits to taking oxycodone this morning.  Will monitor patient in stepdown unit.  She seems to be improving mentally.  She will need a social work and home health RN to help with medications at the time of discharge  2. UTI-patient has abnormal UA with 11-20 WBCs in the urine, started on ceftriaxone.  Follow urine culture results . 3. Atrial fibrillation-she is currently in normal sinus rhythm.  Continue Toprol-XL for rate control, apixaban for anticoagulation.  4. Diabetes mellitus-keep her n.p.o., hold Glucotrol.  Will start sliding scale insulin with NovoLog.  Check CBG every 6 hours.  5. Hypothyroidism-continue Synthroid  6. History of CVA-residual left-sided weakness, continue Keppra for seizure prevention  7. Hyperkalemia-mild with potassium 5.4, started on IV fluids.  Follow BMP in am.  Hold Vasotec.   DVT Prophylaxis-   apixaban  AM Labs Ordered, also please review Full Orders  Family Communication: Admission, patients condition and plan of care including tests being ordered have been discussed with the patient and her husband at bedside who indicate understanding and agree with the plan and Code Status.  Code Status: Full code  Admission status: Observation  Time spent in minutes : 60 minutes   Meredeth IdeGagan S Macaria Bias M.D on 12/20/2017 at 5:03 PM  Between 7am to 7pm - Pager - [phone]. After 7pm go to [email or web address] - password [email or web address]  [facility] - Office  [phone]

## 2017-12-20 NOTE — ED Notes (Signed)
Dr zammit canceled code sepsis

## 2017-12-20 NOTE — ED Provider Notes (Signed)
[facility] EMERGENCY DEPARTMENT Provider Note   CSN: [id number] Arrival date & time: 12/20/17  1204     History   Chief Complaint Chief Complaint  Patient presents with  . Fatigue  . Hypotension    HPI Megan [name] is a 67 y.o. female.  Patient was at the doctor's office today and was lethargic with a low blood pressure so she was sent to the emergency department.  The history is provided by the patient. No language interpreter was used.  Illness  This is a new problem. The current episode started 1 to 2 hours ago. The problem occurs constantly. The problem has not changed since onset.Pertinent negatives include no chest pain, no abdominal pain and no headaches. Nothing aggravates the symptoms. Nothing relieves the symptoms. She has tried nothing for the symptoms. The treatment provided no relief.    Past Medical History:  Diagnosis Date  . Agoraphobia with panic disorder   . Anxiety   . Arterial hypotension 12/20/2017  . Arthritis   . Chronic pain 03/08/15   Hydrocodone-APAP 10/325 mg, # 120 q month  . Colon polyps   . Complication of anesthesia   . Depression   . DM (diabetes mellitus) (HCC)   . GERD (gastroesophageal reflux disease)   . HTN (hypertension)   . Hypothyroidism   . Obesity   . PAF (paroxysmal atrial fibrillation) (HCC)   . PONV (postoperative nausea and vomiting)   . Stroke (HCC) 08/20/2014   left sided weakness; some memory loss.    Patient Active Problem List   Diagnosis Date Noted  . Confusion 12/20/2017  . Hypotension 12/20/2017  . Atrial fibrillation, chronic (HCC) 11/17/2017  . Current use of long term anticoagulation 11/17/2017  . Weakness 11/14/2017  . Acute metabolic encephalopathy 11/14/2017  . PUD (peptic ulcer disease) 04/07/2016  . Constipation 04/07/2016  . Diverticulosis of colon without hemorrhage   . Rectal bleeding   . BRBPR (bright red blood per rectum) 01/25/2016  . Hypoglycemia 01/20/2016  . Upper GI bleeding  01/20/2016  . H/O: CVA (cerebrovascular accident) 01/20/2016  . Chronic pain 01/20/2016  . DM type 2 (diabetes mellitus, type 2) (HCC) 01/20/2016  . Benign essential HTN 01/20/2016  . Anxiety and depression 01/20/2016  . Blood loss anemia 01/20/2016  . Knee pain, acute   . Cephalalgia   . GERD (gastroesophageal reflux disease) 06/14/2011  . FH: colon cancer 04/05/2011  . History of colon polyps 04/05/2011  . Left sided abdominal pain 04/05/2011  . N&V (nausea and vomiting) 04/05/2011    Past Surgical History:  Procedure Laterality Date  . ABDOMINAL HYSTERECTOMY  15 yrs ago   with bso at aph  . CARDIAC CATHETERIZATION  2004   normal  . CHOLECYSTECTOMY  25 yrs ago   [facility]  . COLONOSCOPY  05/04/11   left-sided colonic diverticulosis  . COLONOSCOPY N/A 01/27/2016   diverticulosis in sigmoid colon and descending colon, active bleeding Dieulafoy lesion s/p clips X 3, next colonoscopy in 5 years due to family history of colon cancer    . ESOPHAGOGASTRODUODENOSCOPY  05/04/2011   Abnormal distal esophagus, biopsies consistent with inflammation due to acid reflux. No evidence of Barrett's. No H. pylori  . ESOPHAGOGASTRODUODENOSCOPY N/A 01/22/2016   erosive gastropathy, non-bleeding gastric ulcers and 1 oozing gastric ulcer with adherent clot s/p heater probe. Negative H.pylori serology.   . ESOPHAGOGASTRODUODENOSCOPY (EGD) WITH PROPOFOL N/A 04/27/2016   Dr. Jena Gauss: healed ulcers. normal esophagus  . RADIOLOGY WITH ANESTHESIA N/A 08/20/2015  Procedure: RADIOLOGY WITH ANESTHESIA;  Surgeon: Julieanne Cotton, MD;  Location: [facility] OR;  Service: Radiology;  Laterality: N/A;     OB History   None      Home Medications    Prior to Admission medications   Medication Sig Start Date End Date Taking? Authorizing Provider  acetaminophen (TYLENOL) 325 MG tablet Take 2 tablets (650 mg total) by mouth every 6 (six) hours as needed for mild pain, moderate pain or headache. 11/19/17  Yes Johnson, Clanford  L, MD  apixaban (ELIQUIS) 5 MG TABS tablet Take 1 tablet (5 mg total) by mouth 2 (two) times daily. Restart on 7/14 Patient taking differently: Take 5 mg by mouth 2 (two) times daily.  01/27/16  Yes Erick Blinks, MD  atorvastatin (LIPITOR) 80 MG tablet Take 80 mg by mouth daily.   Yes [provider]  enalapril (VASOTEC) 20 MG tablet Take 20 mg by mouth 2 (two) times daily.    Yes [provider]  escitalopram (LEXAPRO) 10 MG tablet Take 1 tablet (10 mg total) by mouth daily. Patient taking differently: Take 10 mg by mouth as needed.  11/19/17  Yes Johnson, Clanford L, MD  glipiZIDE (GLUCOTROL) 5 MG tablet Take 1 tablet (5 mg total) by mouth daily before breakfast. In the AM 11/19/17  Yes Johnson, Clanford L, MD  levETIRAcetam (KEPPRA) 250 MG tablet Take 1 tablet (250 mg total) by mouth 2 (two) times daily. 11/19/17  Yes Johnson, Clanford L, MD  levothyroxine (SYNTHROID, LEVOTHROID) 25 MCG tablet Take 25 mcg by mouth daily. 07/14/15  Yes [provider]  metFORMIN (GLUCOPHAGE-XR) 500 MG 24 hr tablet Take 1,000 mg by mouth 2 (two) times daily.   Yes [provider]  metoprolol (TOPROL-XL) 50 MG 24 hr tablet Take 50 mg by mouth daily with lunch.    Yes [provider]  pantoprazole (PROTONIX) 40 MG tablet TAKE (1) TABLET BY MOUTH TWICE DAILY BEFORE A MEAL. 10/05/17  Yes Anice Paganini, NP  predniSONE (DELTASONE) 5 MG tablet Take 5 mg by mouth daily.   Yes [provider]  Cholecalciferol (VITAMIN D-3 PO) Take 1 tablet by mouth daily.    [provider]  nitrofurantoin, macrocrystal-monohydrate, (MACROBID) 100 MG capsule Take 1 capsule by mouth 2 (two) times daily. 12/08/17   [provider]    Family History Family History  Problem Relation Age of Onset  . Colon cancer Paternal Aunt        2, colon cancer  . Colon cancer Paternal Uncle        6, colon cancer  . Colon cancer Father        deceased, age 70  . Colon cancer Unknown         numerous cousins died before age 49 with colon cancer  . Colon cancer Sister        deceased, age 81  . Anesthesia problems Neg Hx   . Hypotension Neg Hx   . Malignant hyperthermia Neg Hx   . Pseudochol deficiency Neg Hx     Social History Social History   Tobacco Use  . Smoking status: Former Smoker    Packs/day: 0.25    Years: 15.00    Pack years: 3.75    Types: Cigarettes    Start date: 07/17/1969    Last attempt to quit: 07/18/1971    Years since quitting: 46.4  . Smokeless tobacco: Former Neurosurgeon    Types: Snuff    Quit date: 08/20/2014  .  Tobacco comment: never more than 1-2 cig/day in entire life  Substance Use Topics  . Alcohol use: No    Alcohol/week: 0.0 oz  . Drug use: No     Allergies   Naproxen   Review of Systems Review of Systems  Constitutional: Negative for appetite change and fatigue.  HENT: Negative for congestion, ear discharge and sinus pressure.   Eyes: Negative for discharge.  Respiratory: Negative for cough.   Cardiovascular: Negative for chest pain.  Gastrointestinal: Negative for abdominal pain and diarrhea.  Genitourinary: Negative for frequency and hematuria.  Musculoskeletal: Negative for back pain.  Skin: Negative for rash.  Neurological: Positive for weakness. Negative for seizures and headaches.  Psychiatric/Behavioral: Negative for hallucinations.     Physical Exam Updated Vital Signs BP (!) 91/49   Pulse 71   Temp (!) 97.1 F (36.2 C) (Rectal)   Resp 20   SpO2 98%   Physical Exam  Constitutional: She is oriented to person, place, and time. She appears well-developed.  HENT:  Head: Normocephalic.  Eyes: Conjunctivae and EOM are normal. No scleral icterus.  Neck: Neck supple. No thyromegaly present.  Cardiovascular: Normal rate and regular rhythm. Exam reveals no gallop and no friction rub.  No murmur heard. Pulmonary/Chest: No stridor. She has no wheezes. She has no rales. She exhibits no tenderness.  Abdominal: She  exhibits no distension. There is no tenderness. There is no rebound.  Musculoskeletal: Normal range of motion. She exhibits no edema.  Lymphadenopathy:    She has no cervical adenopathy.  Neurological: She is oriented to person, place, and time. She exhibits normal muscle tone. Coordination normal.  Mildly lethargic  Skin: No rash noted. No erythema.  Psychiatric: She has a normal mood and affect. Her behavior is normal.     ED Treatments / Results  Labs (all labs ordered are listed, but only abnormal results are displayed) Labs Reviewed  URINALYSIS, ROUTINE W REFLEX MICROSCOPIC - Abnormal; Notable for the following components:      Result Value   APPearance CLOUDY (*)    Hgb urine dipstick SMALL (*)    Leukocytes, UA TRACE (*)    Bacteria, UA MANY (*)    All other components within normal limits  CBC WITH DIFFERENTIAL/PLATELET - Abnormal; Notable for the following components:   RBC 3.67 (*)    Hemoglobin 11.3 (*)    All other components within normal limits  COMPREHENSIVE METABOLIC PANEL - Abnormal; Notable for the following components:   Glucose, Bld 131 (*)    BUN 27 (*)    Creatinine, Ser 1.73 (*)    Albumin 3.2 (*)    ALT 13 (*)    GFR calc non Af Amer 29 (*)    GFR calc Af Amer 34 (*)    All other components within normal limits  CBC WITH DIFFERENTIAL/PLATELET - Abnormal; Notable for the following components:   RBC 3.69 (*)    Hemoglobin 11.2 (*)    All other components within normal limits  RAPID URINE DRUG SCREEN, HOSP PERFORMED - Abnormal; Notable for the following components:   Opiates POSITIVE (*)    Benzodiazepines POSITIVE (*)    All other components within normal limits  I-STAT CHEM 8, ED - Abnormal; Notable for the following components:   BUN 26 (*)    Creatinine, Ser 1.60 (*)    Glucose, Bld 128 (*)    Hemoglobin 11.2 (*)    HCT 33.0 (*)    All other components within normal  limits  I-STAT CHEM 8, ED - Abnormal; Notable for the following components:     Potassium 5.4 (*)    BUN 35 (*)    Creatinine, Ser 1.60 (*)    Glucose, Bld 127 (*)    Hemoglobin 11.6 (*)    HCT 34.0 (*)    All other components within normal limits  CULTURE, BLOOD (ROUTINE X 2)  CULTURE, BLOOD (ROUTINE X 2)  URINE CULTURE  OCCULT BLOOD X 1 CARD TO LAB, STOOL  I-STAT CG4 LACTIC ACID, ED  I-STAT TROPONIN, ED  POC OCCULT BLOOD, ED  I-STAT CG4 LACTIC ACID, ED    EKG EKG Interpretation  Date/Time:  Thursday December 20 2017 12:18:23 EDT Ventricular Rate:  61 PR Interval:    QRS Duration: 102 QT Interval:  419 QTC Calculation: 422 R Axis:   89 Text Interpretation:  Sinus rhythm Borderline right axis deviation Low voltage, precordial leads Baseline wander in lead(s) V2 Confirmed by [name] [phone]) on 12/20/2017 2:54:13 PM   Radiology Dg Chest Portable 1 View  Result Date: 12/20/2017 CLINICAL DATA:  Weakness.  Smoker. EXAM: PORTABLE CHEST 1 VIEW COMPARISON:  09/11/2017. FINDINGS: Borderline enlarged cardiac silhouette. Clear lungs with normal vascularity. Minimal peribronchial thickening with improvement. Unremarkable bones. IMPRESSION: Minimal bronchitic changes, with improvement. Electronically Signed   By: Beckie Salts M.D.   On: 12/20/2017 12:52    Procedures Procedures (including critical care time)  Medications Ordered in ED Medications  sodium chloride 0.9 % bolus 1,000 mL (1,000 mLs Intravenous New Bag/Given 12/20/17 1537)  sodium chloride 0.9 % bolus 2,000 mL (0 mLs Intravenous Stopped 12/20/17 1606)  naloxone (NARCAN) injection 1 mg (1 mg Intravenous Given 12/20/17 1237)  naloxone (NARCAN) injection 1 mg (1 mg Intravenous Given 12/20/17 1422)  cefTRIAXone (ROCEPHIN) 1 g in sodium chloride 0.9 % 100 mL IVPB (1 g Intravenous New Bag/Given 12/20/17 1537)  naloxone (NARCAN) injection 1 mg (1 mg Intravenous Given 12/20/17 1606)     Initial Impression / Assessment and Plan / ED Course  I have reviewed the triage vital signs and the nursing  notes.  Pertinent labs & imaging results that were available during my care of the patient were reviewed by me and considered in my medical decision making (see chart for details).     CRITICAL CARE Performed by: [name] Total critical care time:40 minutes Critical care time was exclusive of separately billable procedures and treating other patients. Critical care was necessary to treat or prevent imminent or life-threatening deterioration. Critical care was time spent personally by me on the following activities: development of treatment plan with patient and/or surrogate as well as nursing, discussions with consultants, evaluation of patient's response to treatment, examination of patient, obtaining history from patient or surrogate, ordering and performing treatments and interventions, ordering and review of laboratory studies, ordering and review of radiographic studies, pulse oximetry and re-evaluation of patient's condition.  Labs are unremarkable except for dehydration.  And urinalysis shows urinary tract infection.  She does have urine test is positive for benzos and opiates.  I suspect some of the lethargy is related to substance abuse.  Patient is also dehydrated she has been given IV fluids and antibiotics and will be admitted to medicine Final Clinical Impressions(s) / ED Diagnoses   Final diagnoses:  Hypotension due to drugs    ED Discharge Orders    None       [name], MD 12/20/17 1621

## 2017-12-20 NOTE — Progress Notes (Signed)
cc'ed to pcp °

## 2017-12-20 NOTE — Patient Instructions (Signed)
Please go to [facility]. I let them know you are coming. I am worried about your blood pressure and drowsiness. I am also worried about the medications and making sure you haven't taken something inadvertently.   We will also make a referral for Case Management [facility] to help with medications and coordinating care as outpatient.   It was a pleasure to see you today. I strive to create trusting relationships with patients to provide genuine, compassionate, and quality care. I value your feedback. If you receive a survey regarding your visit,  I greatly appreciate you taking time to fill this out.   Gelene MinkAnna W. Madalin Hughart, PhD, ANP-BC [facility]

## 2017-12-21 ENCOUNTER — Encounter (HOSPITAL_COMMUNITY): Payer: Self-pay | Admitting: Family Medicine

## 2017-12-21 DIAGNOSIS — Z7901 Long term (current) use of anticoagulants: Secondary | ICD-10-CM

## 2017-12-21 DIAGNOSIS — G92 Toxic encephalopathy: Secondary | ICD-10-CM | POA: Diagnosis not present

## 2017-12-21 DIAGNOSIS — K219 Gastro-esophageal reflux disease without esophagitis: Secondary | ICD-10-CM

## 2017-12-21 DIAGNOSIS — I952 Hypotension due to drugs: Secondary | ICD-10-CM | POA: Diagnosis not present

## 2017-12-21 DIAGNOSIS — G929 Unspecified toxic encephalopathy: Secondary | ICD-10-CM | POA: Diagnosis present

## 2017-12-21 DIAGNOSIS — G894 Chronic pain syndrome: Secondary | ICD-10-CM | POA: Diagnosis not present

## 2017-12-21 DIAGNOSIS — I482 Chronic atrial fibrillation: Secondary | ICD-10-CM

## 2017-12-21 DIAGNOSIS — N390 Urinary tract infection, site not specified: Secondary | ICD-10-CM | POA: Diagnosis not present

## 2017-12-21 DIAGNOSIS — F4001 Agoraphobia with panic disorder: Secondary | ICD-10-CM | POA: Diagnosis not present

## 2017-12-21 DIAGNOSIS — E669 Obesity, unspecified: Secondary | ICD-10-CM | POA: Diagnosis not present

## 2017-12-21 DIAGNOSIS — F329 Major depressive disorder, single episode, unspecified: Secondary | ICD-10-CM | POA: Diagnosis not present

## 2017-12-21 DIAGNOSIS — I69354 Hemiplegia and hemiparesis following cerebral infarction affecting left non-dominant side: Secondary | ICD-10-CM | POA: Diagnosis not present

## 2017-12-21 DIAGNOSIS — E119 Type 2 diabetes mellitus without complications: Secondary | ICD-10-CM | POA: Diagnosis not present

## 2017-12-21 DIAGNOSIS — I10 Essential (primary) hypertension: Secondary | ICD-10-CM | POA: Diagnosis not present

## 2017-12-21 DIAGNOSIS — E875 Hyperkalemia: Secondary | ICD-10-CM | POA: Diagnosis not present

## 2017-12-21 DIAGNOSIS — T40605A Adverse effect of unspecified narcotics, initial encounter: Secondary | ICD-10-CM | POA: Diagnosis not present

## 2017-12-21 DIAGNOSIS — I959 Hypotension, unspecified: Secondary | ICD-10-CM | POA: Diagnosis present

## 2017-12-21 DIAGNOSIS — E860 Dehydration: Secondary | ICD-10-CM | POA: Diagnosis not present

## 2017-12-21 DIAGNOSIS — E039 Hypothyroidism, unspecified: Secondary | ICD-10-CM | POA: Diagnosis not present

## 2017-12-21 DIAGNOSIS — T424X5A Adverse effect of benzodiazepines, initial encounter: Secondary | ICD-10-CM | POA: Diagnosis not present

## 2017-12-21 DIAGNOSIS — Z6838 Body mass index (BMI) 38.0-38.9, adult: Secondary | ICD-10-CM | POA: Diagnosis not present

## 2017-12-21 HISTORY — DX: Hypotension due to drugs: I95.2

## 2017-12-21 LAB — CBC
HCT: 38.8 % (ref 36.0–46.0)
Hemoglobin: 12 g/dL (ref 12.0–15.0)
MCH: 30.8 pg (ref 26.0–34.0)
MCHC: 30.9 g/dL (ref 30.0–36.0)
MCV: 99.7 fL (ref 78.0–100.0)
Platelets: 148 10*3/uL — ABNORMAL LOW (ref 150–400)
RBC: 3.89 MIL/uL (ref 3.87–5.11)
RDW: 14.7 % (ref 11.5–15.5)
WBC: 4.4 10*3/uL (ref 4.0–10.5)

## 2017-12-21 LAB — GLUCOSE, CAPILLARY
GLUCOSE-CAPILLARY: 71 mg/dL (ref 65–99)
Glucose-Capillary: 104 mg/dL — ABNORMAL HIGH (ref 65–99)

## 2017-12-21 LAB — COMPREHENSIVE METABOLIC PANEL
ALT: 12 U/L — AB (ref 14–54)
AST: 18 U/L (ref 15–41)
Albumin: 2.8 g/dL — ABNORMAL LOW (ref 3.5–5.0)
Alkaline Phosphatase: 98 U/L (ref 38–126)
Anion gap: 5 (ref 5–15)
BUN: 20 mg/dL (ref 6–20)
CHLORIDE: 115 mmol/L — AB (ref 101–111)
CO2: 23 mmol/L (ref 22–32)
CREATININE: 1.22 mg/dL — AB (ref 0.44–1.00)
Calcium: 8.4 mg/dL — ABNORMAL LOW (ref 8.9–10.3)
GFR calc non Af Amer: 45 mL/min — ABNORMAL LOW (ref >60)
GFR, EST AFRICAN AMERICAN: 52 mL/min — AB (ref >60)
Glucose, Bld: 83 mg/dL (ref 65–99)
Potassium: 4.8 mmol/L (ref 3.5–5.1)
SODIUM: 143 mmol/L (ref 135–145)
Total Bilirubin: 0.5 mg/dL (ref 0.3–1.2)
Total Protein: 6.7 g/dL (ref 6.5–8.1)

## 2017-12-21 LAB — HEMOGLOBIN A1C
Hgb A1c MFr Bld: 6.3 % — ABNORMAL HIGH (ref 4.8–5.6)
MEAN PLASMA GLUCOSE: 134.11 mg/dL

## 2017-12-21 MED ORDER — INSULIN ASPART 100 UNIT/ML ~~LOC~~ SOLN: 0.0000 [IU] | Freq: Three times a day (TID) | SUBCUTANEOUS | Status: DC

## 2017-12-21 MED ORDER — CEFDINIR 300 MG PO CAPS: 300.0000 mg | ORAL_CAPSULE | Freq: Two times a day (BID) | ORAL | 0 refills | Status: AC

## 2017-12-21 MED ORDER — METOPROLOL SUCCINATE ER 50 MG PO TB24: 50.0000 mg | ORAL_TABLET | Freq: Every day | ORAL | Status: AC

## 2017-12-21 NOTE — Evaluation (Signed)
Physical Therapy Evaluation Patient Details Name: Megan [name] MRN: [id number] DOB: [birth year] Today's Date: 12/21/2017   History of Present Illness  Megan [name]  is a 67 y.o. female, with history of CVA, chronic pain syndrome, depression, GERD, chronic left-sided weakness from previous stroke was sent to the ED from PCP office after patient was found to be hypotensive and lethargic.  Patient at this time is alert and awake and answering questions appropriately.    Clinical Impression  Patient functioning near baseline for functional mobility and gait other than needing Min Guard assist for sitting up from commode and walking in hallway without loss of balance and her spouse is comfortable assisting her and taking her home today.  Plan:  Patient discharged from physical therapy to care of nursing for ambulation daily as tolerated for length of stay.    Follow Up Recommendations No PT follow up    Equipment Recommendations  None recommended by PT    Recommendations for Other Services       Precautions / Restrictions Precautions Precautions: Fall Restrictions Weight Bearing Restrictions: No      Mobility  Bed Mobility Overal bed mobility: Needs Assistance Bed Mobility: Supine to Sit     Supine to sit: Min guard        Transfers Overall transfer level: Needs assistance   Transfers: Sit to/from Stand;Stand Pivot Transfers Sit to Stand: Supervision Stand pivot transfers: Supervision          Ambulation/Gait Ambulation/Gait assistance: Min guard Ambulation Distance (Feet): 65 Feet Assistive device: Quad cane Gait Pattern/deviations: Decreased step length - right;Decreased step length - left;Decreased stride length Gait velocity: decreased   General Gait Details: demonstrates slightly labored slow cadence without occasional slowing to maintain balance with mostly 3 point gait pattern using quad-cane, no loss of balance, limited secondary to  fatigue  Stairs            Wheelchair Mobility    Modified Rankin (Stroke Patients Only)       Balance Overall balance assessment: Needs assistance Sitting-balance support: No upper extremity supported Sitting balance-Leahy Scale: Good     Standing balance support: During functional activity;No upper extremity supported Standing balance-Leahy Scale: Fair Standing balance comment: fair/good with quad cane                             Pertinent Vitals/Pain Pain Assessment: No/denies pain    Home Living Family/patient expects to be discharged to:: Private residence Living Arrangements: Spouse/significant other Available Help at Discharge: Family Type of Home: Mobile home Home Access: Stairs to enter Entrance Stairs-Rails: Right;Left;Can reach both Entrance Stairs-Number of Steps: 6 Home Layout: One level Home Equipment: Cane - single point;Bedside commode      Prior Function Level of Independence: Independent with assistive device(s);Needs assistance   Gait / Transfers Assistance Needed: household gait with SPC, supervised by spouse when going outside  ADL's / Homemaking Assistance Needed: spouses assist        Hand Dominance   Dominant Hand: Right    Extremity/Trunk Assessment   Upper Extremity Assessment Upper Extremity Assessment: Overall WFL for tasks assessed;RUE deficits/detail;LUE deficits/detail RUE Deficits / Details: grossly 5/5 LUE Deficits / Details: grossly 3+/5         Cervical / Trunk Assessment Cervical / Trunk Assessment: Normal  Communication   Communication: No difficulties  Cognition Arousal/Alertness: Awake/alert Behavior During Therapy: WFL for tasks assessed/performed Overall Cognitive Status: Within Functional Limits for  tasks assessed                                        General Comments      Exercises     Assessment/Plan    PT Assessment Patent does not need any further PT services   PT Problem List         PT Treatment Interventions      PT Goals (Current goals can be found in the Care Plan section)  Acute Rehab PT Goals Patient Stated Goal: return home with spouse to assist PT Goal Formulation: With patient/family Time For Goal Achievement: 12/21/17 Potential to Achieve Goals: Good    Frequency     Barriers to discharge        Co-evaluation               AM-PAC PT "6 Clicks" Daily Activity  Outcome Measure Difficulty turning over in bed (including adjusting bedclothes, sheets and blankets)?: None Difficulty moving from lying on back to sitting on the side of the bed? : A Little Difficulty sitting down on and standing up from a chair with arms (e.g., wheelchair, bedside commode, etc,.)?: A Little Help needed moving to and from a bed to chair (including a wheelchair)?: None Help needed walking in hospital room?: A Little Help needed climbing 3-5 steps with a railing? : A Little 6 Click Score: 20    End of Session   Activity Tolerance: Patient tolerated treatment well;Patient limited by fatigue Patient left: in chair;with call bell/phone within reach;with family/visitor present Nurse Communication: Mobility status;Other (comment)(RN notified that patient left up in chair) PT Visit Diagnosis: Unsteadiness on feet (R26.81);Other abnormalities of gait and mobility (R26.89);Muscle weakness (generalized) (M62.81)    Time: [id number] PT Time Calculation (min) (ACUTE ONLY): 31 min   Charges:   PT Evaluation $PT Eval Moderate Complexity: 1 Mod PT Treatments $Therapeutic Activity: 23-37 mins   PT G Codes:        9:03 AM, 12/21/17 Ocie BobJames Daiya Tamer, MPT Physical Therapist with [facility] [phone] office [phone] mobile phone

## 2017-12-21 NOTE — Discharge Summary (Signed)
Physician Discharge Summary  Megan [name] ZOX:[id number] DOB: 07-06-1951 DOA: 12/20/2017  PCP: [name], MD  Admit date: 12/20/2017 Discharge date: 12/21/2017  Admitted From: HOME  Disposition: HOME  Recommendations for Outpatient Follow-up:  1. Follow up with PCP in 1 weeks 2. FOLLOW UP WITH CARDIOLOGY AS SCHEDULED 3. Please obtain BMP/CBC in 1-2 week 4. Please follow up on the following pending results: FINAL CULTURE DATA  Home Health: RN  Discharge Condition: STABLE   CODE STATUS: FULL    Brief Hospitalization Summary: Please see all hospital notes, images, labs for full details of the hospitalization.  HPI:    Megan [name]  is a 66 y.o. female, with history of CVA, chronic pain syndrome, depression, GERD, chronic left-sided weakness from previous stroke was sent to the ED from PCP office after patient was found to be hypotensive and lethargic.  Patient at this time is alert and awake and answering questions appropriately. She denies taking sedatives at home. Urine toxicology was positive for opiates and benzodiazepines She denies dysuria, fever or chills. Denies chest pain or shortness of breath. Denies nausea, vomiting or diarrhea In the ED patient got Narcan, ceftriaxone for UTI Urine and blood cultures have been obtained. Lactic acid is 1.05 Blood pressure has improved with IV fluids.  The patient was admitted to SDU and given IVFs and given several doses of naloxone IV and she seemed to get better and eventually return to her baseline mental status.  Her urine drug screen was positive for infection and she was treated with IV ceftriaxone.  She was also positive for opioids and benzodiazepines but the patient adamantly denies that she has been taking these medications.  She was recently discharged from this facility where she had been taken off all controlled substances and she did well and was discharged to a SNF. She is now back at home.  She is being cared  for by her husband but he does not really know what she is supposed to be taking.  The patient refuses to stay in the hospital any longer and is threatening to leave AMA if she is not quickly discharged.  Because her vitals have improved and she is back to baseline mentation will discharge her with counseling and strict instructions to avoid controlled substances.  Unfortunately she is likely to go home and begin taking them again.  I am arranging for a home health RN to go to home and investigate and monitor her medication usage.  She is HIGH RISK FOR READMISSION because she is noncompliant with following instructions regarding not taking opioids and benzodiazepines.    She has a UTI and she will be given a prescription for that.  She is told to avoid all controlled substances.  She verbalizes understanding.    Discharge Diagnoses:  Active Problems:   GERD (gastroesophageal reflux disease)   H/O: CVA (cerebrovascular accident)   Chronic pain   Atrial fibrillation, chronic (HCC)   Current use of long term anticoagulation   UTI (urinary tract infection)   Toxic encephalopathy   Hypotension due to drugs    Discharge Instructions: Discharge Instructions    Call MD for:  difficulty breathing, headache or visual disturbances   Complete by:  As directed    Call MD for:  extreme fatigue   Complete by:  As directed    Call MD for:  persistant dizziness or light-headedness   Complete by:  As directed    Call MD for:  persistant nausea and vomiting  Complete by:  As directed    Call MD for:  severe uncontrolled pain   Complete by:  As directed    Increase activity slowly   Complete by:  As directed      Allergies as of 12/21/2017      Reactions   Naproxen Other (See Comments)   Causes stomach burning      Medication List    TAKE these medications   acetaminophen 325 MG tablet Commonly known as:  TYLENOL Take 2 tablets (650 mg total) by mouth every 6 (six) hours as needed for mild pain,  moderate pain or headache.   apixaban 5 MG Tabs tablet Commonly known as:  ELIQUIS Take 1 tablet (5 mg total) by mouth 2 (two) times daily. Restart on 7/14 What changed:  additional instructions   atorvastatin 80 MG tablet Commonly known as:  LIPITOR Take 80 mg by mouth daily.   cefdinir 300 MG capsule Commonly known as:  OMNICEF Take 1 capsule (300 mg total) by mouth 2 (two) times daily for 5 days.   enalapril 20 MG tablet Commonly known as:  VASOTEC Take 20 mg by mouth 2 (two) times daily.   escitalopram 10 MG tablet Commonly known as:  LEXAPRO Take 1 tablet (10 mg total) by mouth daily. What changed:    when to take this  reasons to take this   glipiZIDE 5 MG tablet Commonly known as:  GLUCOTROL Take 1 tablet (5 mg total) by mouth daily before breakfast. In the AM   levETIRAcetam 250 MG tablet Commonly known as:  KEPPRA Take 1 tablet (250 mg total) by mouth 2 (two) times daily.   levothyroxine 25 MCG tablet Commonly known as:  SYNTHROID, LEVOTHROID Take 25 mcg by mouth daily.   metFORMIN 500 MG 24 hr tablet Commonly known as:  GLUCOPHAGE-XR Take 1,000 mg by mouth 2 (two) times daily.   metoprolol succinate 50 MG 24 hr tablet Commonly known as:  TOPROL-XL Take 1 tablet (50 mg total) by mouth daily with lunch. Start taking on:  12/22/2017   nitrofurantoin (macrocrystal-monohydrate) 100 MG capsule Commonly known as:  MACROBID Take 1 capsule by mouth 2 (two) times daily.   pantoprazole 40 MG tablet Commonly known as:  PROTONIX TAKE (1) TABLET BY MOUTH TWICE DAILY BEFORE A MEAL.   predniSONE 5 MG tablet Commonly known as:  DELTASONE Take 5 mg by mouth daily.   VITAMIN D-3 PO Take 1 tablet by mouth daily.      Follow-up Information    [name], MD. Schedule an appointment as soon as possible for a visit in 5 day(s).   Specialty:  Family Medicine Why:  Hospital Follow Up: Medication Review Contact information: [address] [place]  [place] [address] [phone]        Pricilla Riffle, MD. Schedule an appointment as soon as possible for a visit in 2 week(s).   Specialty:  Cardiology Why:  Hospital Follow Up  Contact information: [address] [place] [address] [phone]          Allergies  Allergen Reactions  . Naproxen Other (See Comments)    Causes stomach burning   Allergies as of 12/21/2017      Reactions   Naproxen Other (See Comments)   Causes stomach burning      Medication List    TAKE these medications   acetaminophen 325 MG tablet Commonly known as:  TYLENOL Take 2 tablets (650 mg total) by mouth every  6 (six) hours as needed for mild pain, moderate pain or headache.   apixaban 5 MG Tabs tablet Commonly known as:  ELIQUIS Take 1 tablet (5 mg total) by mouth 2 (two) times daily. Restart on 7/14 What changed:  additional instructions   atorvastatin 80 MG tablet Commonly known as:  LIPITOR Take 80 mg by mouth daily.   cefdinir 300 MG capsule Commonly known as:  OMNICEF Take 1 capsule (300 mg total) by mouth 2 (two) times daily for 5 days.   enalapril 20 MG tablet Commonly known as:  VASOTEC Take 20 mg by mouth 2 (two) times daily.   escitalopram 10 MG tablet Commonly known as:  LEXAPRO Take 1 tablet (10 mg total) by mouth daily. What changed:    when to take this  reasons to take this   glipiZIDE 5 MG tablet Commonly known as:  GLUCOTROL Take 1 tablet (5 mg total) by mouth daily before breakfast. In the AM   levETIRAcetam 250 MG tablet Commonly known as:  KEPPRA Take 1 tablet (250 mg total) by mouth 2 (two) times daily.   levothyroxine 25 MCG tablet Commonly known as:  SYNTHROID, LEVOTHROID Take 25 mcg by mouth daily.   metFORMIN 500 MG 24 hr tablet Commonly known as:  GLUCOPHAGE-XR Take 1,000 mg by mouth 2 (two) times daily.   metoprolol succinate 50 MG 24 hr tablet Commonly known as:  TOPROL-XL Take 1 tablet (50 mg total) by mouth daily with lunch. Start  taking on:  12/22/2017   nitrofurantoin (macrocrystal-monohydrate) 100 MG capsule Commonly known as:  MACROBID Take 1 capsule by mouth 2 (two) times daily.   pantoprazole 40 MG tablet Commonly known as:  PROTONIX TAKE (1) TABLET BY MOUTH TWICE DAILY BEFORE A MEAL.   predniSONE 5 MG tablet Commonly known as:  DELTASONE Take 5 mg by mouth daily.   VITAMIN D-3 PO Take 1 tablet by mouth daily.      Procedures/Studies: Dg Chest Portable 1 View  Result Date: 12/20/2017 CLINICAL DATA:  Weakness.  Smoker. EXAM: PORTABLE CHEST 1 VIEW COMPARISON:  09/11/2017. FINDINGS: Borderline enlarged cardiac silhouette. Clear lungs with normal vascularity. Minimal peribronchial thickening with improvement. Unremarkable bones. IMPRESSION: Minimal bronchitic changes, with improvement. Electronically Signed   By: Beckie Salts M.D.   On: 12/20/2017 12:52      Subjective: Pt says that she wants to go home as soon as possible and if not discharged soon she will leave AMA.    Discharge Exam: Vitals:   12/21/17 0800 12/21/17 0810  BP:  114/72  Pulse: 66 71  Resp: (!) 8 (!) 22  Temp:    SpO2: 98% 100%   Vitals:   12/21/17 0600 12/21/17 0700 12/21/17 0800 12/21/17 0810  BP:  (!) 98/47  114/72  Pulse: 72 72 66 71  Resp: (!) 21 (!) 21 (!) 8 (!) 22  Temp:      TempSrc:      SpO2: 98% 97% 98% 100%  Weight:      Height:      General: Pt is alert, awake, not in acute distress Cardiovascular: RRR, S1/S2 +, no rubs, no gallops Respiratory: CTA bilaterally, no wheezing, no rhonchi Abdominal: Soft, NT, ND, bowel sounds + Extremities: no edema, no cyanosis   The results of significant diagnostics from this hospitalization (including imaging, microbiology, ancillary and laboratory) are listed below for reference.    Microbiology: Recent Results (from the past 240 hour(s))  Blood Culture (routine x 2)  Status: None (Preliminary result)   Collection Time: 12/20/17 12:31 PM  Result Value Ref Range  Status   Specimen Description BLOOD RIGHT ARM  Final   Special Requests   Final    BOTTLES DRAWN AEROBIC AND ANAEROBIC Blood Culture results may not be optimal due to an inadequate volume of blood received in culture bottles   Culture   Final    NO GROWTH < 24 HOURS Performed at [facility], [address] [place], [place] [address]    Report Status PENDING  Incomplete  Blood Culture (routine x 2)     Status: None (Preliminary result)   Collection Time: 12/20/17 12:32 PM  Result Value Ref Range Status   Specimen Description BLOOD LEFT ARM DRAWN BY RN  Final   Special Requests   Final    BOTTLES DRAWN AEROBIC AND ANAEROBIC Blood Culture results may not be optimal due to an inadequate volume of blood received in culture bottles   Culture   Final    NO GROWTH < 24 HOURS Performed at [facility], [address] [place], [place] [address]    Report Status PENDING  Incomplete  MRSA PCR Screening     Status: None   Collection Time: 12/20/17  5:16 PM  Result Value Ref Range Status   MRSA by PCR NEGATIVE NEGATIVE Final    Comment:        The GeneXpert MRSA Assay (FDA approved for NASAL specimens only), is one component of a comprehensive MRSA colonization surveillance program. It is not intended to diagnose MRSA infection nor to guide or monitor treatment for MRSA infections. Performed at [facility], [address], [place], [place] [address]      Labs: BNP (last 3 results) Recent Labs    09/14/17 1302  BNP 476.0*   Basic Metabolic Panel: Recent Labs  Lab 12/20/17 1229 12/20/17 1232 12/20/17 1248 12/21/17 0537  NA 138 139 139 143  K 4.8 4.8 5.4* 4.8  CL 107 107 106 115*  CO2 24  --   --  23  GLUCOSE 131* 128* 127* 83  BUN 27* 26* 35* 20  CREATININE 1.73* 1.60* 1.60* 1.22*  CALCIUM 8.9  --   --  8.4*   Liver Function Tests: Recent Labs  Lab 12/20/17 1229 12/21/17 0537  AST 19 18  ALT 13* 12*  ALKPHOS 105 98  BILITOT 0.4 0.5  PROT 7.0 6.7  ALBUMIN 3.2*  2.8*   No results for input(s): LIPASE, AMYLASE in the last 168 hours. No results for input(s): AMMONIA in the last 168 hours. CBC: Recent Labs  Lab 12/20/17 1229 12/20/17 1232 12/20/17 1248 12/21/17 0537  WBC 5.5  5.6  --   --  4.4  NEUTROABS 2.7  2.6  --   --   --   HGB 11.2*  11.3* 11.2* 11.6* 12.0  HCT 36.2  36.1 33.0* 34.0* 38.8  MCV 98.1  98.4  --   --  99.7  PLT 155  160  --   --  148*   Cardiac Enzymes: No results for input(s): CKTOTAL, CKMB, CKMBINDEX, TROPONINI in the last 168 hours. BNP: Invalid input(s): POCBNP CBG: Recent Labs  Lab 12/20/17 1843 12/20/17 2309 12/21/17 0555 12/21/17 0742  GLUCAP 86 146* 71 104*   D-Dimer No results for input(s): DDIMER in the last 72 hours. Hgb A1c No results for input(s): HGBA1C in the last 72 hours. Lipid Profile No results for input(s): CHOL, HDL, LDLCALC, TRIG, CHOLHDL, LDLDIRECT in  the last 72 hours. Thyroid function studies No results for input(s): TSH, T4TOTAL, T3FREE, THYROIDAB in the last 72 hours.  Invalid input(s): FREET3 Anemia work up No results for input(s): VITAMINB12, FOLATE, FERRITIN, TIBC, IRON, RETICCTPCT in the last 72 hours. Urinalysis    Component Value Date/Time   COLORURINE YELLOW 12/20/2017 1435   APPEARANCEUR CLOUDY (A) 12/20/2017 1435   LABSPEC 1.008 12/20/2017 1435   PHURINE 5.0 12/20/2017 1435   GLUCOSEU NEGATIVE 12/20/2017 1435   HGBUR SMALL (A) 12/20/2017 1435   BILIRUBINUR NEGATIVE 12/20/2017 1435   KETONESUR NEGATIVE 12/20/2017 1435   PROTEINUR NEGATIVE 12/20/2017 1435   UROBILINOGEN 0.2 03/08/2015 0740   NITRITE NEGATIVE 12/20/2017 1435   LEUKOCYTESUR TRACE (A) 12/20/2017 1435   Sepsis Labs Invalid input(s): PROCALCITONIN,  WBC,  LACTICIDVEN Microbiology Recent Results (from the past 240 hour(s))  Blood Culture (routine x 2)     Status: None (Preliminary result)   Collection Time: 12/20/17 12:31 PM  Result Value Ref Range Status   Specimen Description BLOOD RIGHT ARM   Final   Special Requests   Final    BOTTLES DRAWN AEROBIC AND ANAEROBIC Blood Culture results may not be optimal due to an inadequate volume of blood received in culture bottles   Culture   Final    NO GROWTH < 24 HOURS Performed at [facility], [address] [place], [place] [address]    Report Status PENDING  Incomplete  Blood Culture (routine x 2)     Status: None (Preliminary result)   Collection Time: 12/20/17 12:32 PM  Result Value Ref Range Status   Specimen Description BLOOD LEFT ARM DRAWN BY RN  Final   Special Requests   Final    BOTTLES DRAWN AEROBIC AND ANAEROBIC Blood Culture results may not be optimal due to an inadequate volume of blood received in culture bottles   Culture   Final    NO GROWTH < 24 HOURS Performed at [facility], [address] [place], [place] [address]    Report Status PENDING  Incomplete  MRSA PCR Screening     Status: None   Collection Time: 12/20/17  5:16 PM  Result Value Ref Range Status   MRSA by PCR NEGATIVE NEGATIVE Final    Comment:        The GeneXpert MRSA Assay (FDA approved for NASAL specimens only), is one component of a comprehensive MRSA colonization surveillance program. It is not intended to diagnose MRSA infection nor to guide or monitor treatment for MRSA infections. Performed at [facility], [address] [place], [place] [address]    Time coordinating discharge:  33 mins   SIGNED:  Standley Dakinslanford Elliot Meldrum, MD  [facility] 12/21/2017, 9:43 AM Pager [phone]  If 7PM-7AM, please contact night-coverage [email or web address] Password [email or web address]

## 2017-12-21 NOTE — Progress Notes (Signed)
Discharge information provided. Pt verbalizes and demonstrates understanding of education.

## 2017-12-21 NOTE — Care Management Note (Signed)
Case Management Note  Patient Details  Name: Megan LeekVirginia A [name] MRN: [id number] Date of Birth: 02/25/1951  Subjective/Objective:    UTI.                Action/Plan: Home Health RN ordered. Discussed with patient and husband. Patient agreeable. Offered choice of home health agencies. Patient has no preference. Olegario [facility]athy [facility] notified and will obtain orders via Epic.   Expected Discharge Date:  12/21/17               Expected Discharge Plan:  Home w Home Health Services  In-House Referral:     Discharge planning Services  CM Consult  Post Acute Care Choice:  Home Health Choice offered to:  Patient  DME Arranged:    DME Agency:     HH Arranged:  RN HH Agency:  [facility]  Status of Service:  Completed, signed off  If discussed at Long Length of Stay Meetings, dates discussed:    Additional Comments:  Saul Fabiano, Chrystine OilerSharley Diane, RN 12/21/2017, 12:45 PM

## 2017-12-21 NOTE — Discharge Instructions (Signed)
PLEASE AVOID ALL OPIOID PAIN MEDICATIONS PLEASE AVOID ALL BENZODIAZEPINE MEDICATIONS PLEASE ONLY TAKE THE MEDICATIONS THAT ARE RECOMMENDED AT DISCHARGE.  SEEK MEDICAL CARE OR RETURN IF SYMPTOMS RETURN OR WORSEN OR NEW PROBLEMS DEVELOP  Follow with Primary MD  Gareth MorganKnowlton, Steve, MD  and other consultants as instructed your Hospitalist MD  Please get a complete blood count and chemistry panel checked by your Primary MD at your next visit, and again as instructed by your Primary MD.  Get Medicines reviewed and adjusted: Please take all your medications with you for your next visit with your Primary MD  Laboratory/radiological data: Please request your Primary MD to go over all hospital tests and procedure/radiological results at the follow up, please ask your Primary MD to get all Hospital records sent to his/her office.  In some cases, they will be blood work, cultures and biopsy results pending at the time of your discharge. Please request that your primary care M.D. follows up on these results.  Also Note the following: If you experience worsening of your admission symptoms, develop shortness of breath, life threatening emergency, suicidal or homicidal thoughts you must seek medical attention immediately by calling 911 or calling your MD immediately  if symptoms less severe.  You must read complete instructions/literature along with all the possible adverse reactions/side effects for all the Medicines you take and that have been prescribed to you. Take any new Medicines after you have completely understood and accpet all the possible adverse reactions/side effects.   Do not drive when taking Pain medications or sleeping medications (Benzodaizepines)  Do not take more than prescribed Pain, Sleep and Anxiety Medications. It is not advisable to combine anxiety,sleep and pain medications without talking with your primary care practitioner  Special Instructions: If you have smoked or chewed  Tobacco  in the last 2 yrs please stop smoking, stop any regular Alcohol  and or any Recreational drug use.  Wear Seat belts while driving.  Please note: You were cared for by a hospitalist during your hospital stay. Once you are discharged, your primary care physician will handle any further medical issues. Please note that NO REFILLS for any discharge medications will be authorized once you are discharged, as it is imperative that you return to your primary care physician (or establish a relationship with a primary care physician if you do not have one) for your post hospital discharge needs so that they can reassess your need for medications and monitor your lab values.      Hypotension As your heart beats, it forces blood through your body. This force is called blood pressure. If you have hypotension, you have low blood pressure. When your blood pressure is too low, you may not get enough blood to your brain. You may feel weak, feel light-headed, have a fast heartbeat, or even pass out (faint). Follow these instructions at home: Eating and drinking  Drink enough fluids to keep your pee (urine) clear or pale yellow.  Eat a healthy diet, and follow instructions from your doctor about eating or drinking restrictions. A healthy diet includes: ? Fresh fruits and vegetables. ? Whole grains. ? Low-fat (lean) meats. ? Low-fat dairy products.  Eat extra salt only as told. Do not add extra salt to your diet unless your doctor tells you to.  Eat small meals often.  Avoid standing up quickly after you eat. Medicines  Take over-the-counter and prescription medicines only as told by your doctor. ? Follow instructions from your doctor about changing  how much you take (the dosage) of your medicines, if this applies. ? Do not stop or change your medicine on your own. General instructions  Wear compression stockings as told by your doctor.  Get up slowly from lying down or sitting.  Avoid  hot showers and a lot of heat as told by your doctor.  Return to your normal activities as told by your doctor. Ask what activities are safe for you.  Do not use any products that contain nicotine or tobacco, such as cigarettes and e-cigarettes. If you need help quitting, ask your doctor.  Keep all follow-up visits as told by your doctor. This is important. Contact a doctor if:  You throw up (vomit).  You have watery poop (diarrhea).  You have a fever for more than 2-3 days.  You feel more thirsty than normal.  You feel weak and tired. Get help right away if:  You have chest pain.  You have a fast or irregular heartbeat.  You lose feeling (get numbness) in any part of your body.  You cannot move your arms or your legs.  You have trouble talking.  You get sweaty or feel light-headed.  You faint.  You have trouble breathing.  You have trouble staying awake.  You feel confused. This information is not intended to replace advice given to you by your health care provider. Make sure you discuss any questions you have with your health care provider. Document Released: 09/27/2009 Document Revised: 03/21/2016 Document Reviewed: 03/21/2016 Elsevier Interactive Patient Education  2017 [employer].   Basics of Medicine Management What should I do when I am taking medicines?  Read all of the labels and the inserts that come with your medicines. Review the information often.  Talk with your pharmacist if you notice a change in the size, color, or shape of your medicines.  Try to get all of your medicines at one pharmacy. The pharmacist will have all your information and will understand possible drug interactions.  Ask your health care provider any questions that you have about your prescribed medicines and any over-the-counter medicines, vitamins, and herbal or dietary supplements that you take. It is important to make sure that nothing will interact with any of your  prescribed medicines. What should I know about my medicines?  Know the potential side effects for each medicine that you take.  Know what each of your medicines looks like. This includes size, color, and shape. ? If you are getting confused and having trouble recognizing your different medicines, ask your health care provider or pharmacist about changing your medicines or helping you to identify them more easily. How can I take my medicines safely?  Take medicines only as directed by your health care provider. ? Do not take more of your medicine than instructed. ? Do not take anyone elses medicines. ? Do not share your medicines with other people. ? Do not stop taking your medicines unless you have talked about that with your health care provider. ? You may need to avoid alcohol or certain foods or liquids with one or more of your medicines. Follow your health care provider's instructions.  Do not split, mash, or chew your medicines unless your health care provider tells you to do so. Tell your health care provider if you have trouble swallowing your medicines.  For every liquid medicine, use the dosing container that was provided. How should I organized my medicines?  Use a tool, such as a weekly pillbox, a written  chart from your health care provider, a notebook, or your own calendar to organize your medicine schedule.  If you have trouble recognizing your different medicines, keep them in their original bottles.  Create reminders for taking your medicines. Use sticky notes, or use alarms on your watch, mobile device, or phone calendar.  Your organization system should help you to remember the following information about each medicine: ? Name of the medicine. ? Dosage. ? Schedule. This includes the day and time when it should be taken. ? Appearance. This includes color, shape, size, and stamp. ? How to take your medicines. You may need to take them with or without certain foods, on  an empty stomach, with fluids, or by following some other instruction.  More advanced medicine management systems are also available. These offer weekly or monthly options that are complete with storage, alarms, and visual and audio prompts.  Review your medicine schedule with a family member, friend, or caregiver. Other household members should understand your medicines.  If you have trouble reading the names of your different medicines, ask your pharmacist to provide your medicines in containers with large print.  If you take any medicines on an as needed basis, such as medicines for nausea or pain, it is important that you remember what you have taken and when you did so. Write down the following information each time you take an "as needed" medicine: the name, the dosage, and the date and time that you took it. How should I plan ahead for travel?  Take your pillbox, medicines, and organization system with you when you travel.  Have your medicines refilled before you leave for travel. This will ensure that you do not run out of your medicines while you are away from home.  Always carry an updated list of your medicines with you. If there is an emergency, a respondent can quickly see what medicines you are taking. How should I store and discard my medicines?  Store medicines in a cool, dry area away from light or as directed by your pharmacist or health care provider. The bathroom is not a good place for medicine storage because of heat and humidity.  Store your medicines away from chemicals, medicines for your pet, and medicines of other household members.  Keep medicines where children cannot reach them. Do not leave them on counters or bedside tables. Store them in high cabinets or on high shelves.  Check expiration dates regularly. Do not take expired medicines. Discard medicines that are older than the expiration date.  Learn about the best way to dispose of each medicine that you  take. Find out if your local government recycling program, hospital, or pharmacy has a medicine take-back program for safe disposal. If not, some medicines may be mixed with inedible substances and thrown away in the trash in a sealed bag or empty container. What should I remember?  Tell your health care provider if you experience side effects, you have new symptoms, or you have other concerns. There may be dosing changes or alternative medicines that would be better for you.  Review your medicines regularly with your health care provider. Ask if you need to continue to take each medicine, and discuss how well each one is working. Medicines, diet, medical conditions, weight changes, and other habits can all affect how medicines work.  Refill your medicines early so that you do not risk running out.  In case of an accidental overdose, call your local Poison Control Center at  1-5706294535 or visit your local emergency department immediately. This is important. What should I know about giving medicines to my child?  Use positive reinforcement to help your child take necessary medicines. Try singing, cuddling, and rewards.  Use only the syringes, droppers, dosing spoons, or dosing cups from your childs health care provider or pharmacist.  Always wash your hands before giving medicines.  Learn about the medicine policies at your child's school. ? Meet with the school nurse to review your child's medicine schedule in detail. ? Do not send oral medicines to school with your child.  If your child has trouble taking medicine, forgets a dose, or spits it up, talk with his or her health care provider.  Do not give over-the-counter cough and cold medicines to your child who is younger than 20 years old, unless directed by his or her health care provider.  Do not give your child aspirin unless instructed to do so by your child's pediatrician or cardiologist.  Make sure that your child knows how to  use an inhaler properly, if needed. This information is not intended to replace advice given to you by your health care provider. Make sure you discuss any questions you have with your health care provider. Document Released: 10/18/2010 Document Revised: 03/15/2016 Document Reviewed: 03/05/2014 Elsevier Interactive Patient Education  2018 [employer].

## 2017-12-22 LAB — HIV ANTIBODY (ROUTINE TESTING W REFLEX): HIV Screen 4th Generation wRfx: NONREACTIVE

## 2017-12-23 LAB — URINE CULTURE

## 2017-12-24 ENCOUNTER — Other Ambulatory Visit: Payer: Self-pay | Admitting: *Deleted
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
## 2017-12-25 ENCOUNTER — Other Ambulatory Visit: Payer: Self-pay

## 2017-12-25 LAB — CULTURE, BLOOD (ROUTINE X 2)
CULTURE: NO GROWTH
Culture: NO GROWTH

## 2017-12-25 NOTE — Patient Outreach (Signed)
[facility]) Care Management  12/25/2017  Casimer LeekVirginia A Deems 11/28/1950 [id number]   [name] is a 67 year old female.  Referral notification received due to [facility]  recent hospital discharge. Per medical record, [name] was evaluated in the Emergency Room on 12/20/17 and admitted for Hypotension due to drugs. She was discharged on 12/21/17.   I spoke with [name] today via phone. She reported that she was getting ready to go to a scheduled doctor's appointment and would not return until after 4pm. She requested a call back at a later time.  PLAN: RN CM will contact patient within 3 business days.   Juanell FairlyFelecia Kelly Eisler, RN [facility] Care Management [facility] [phone]

## 2017-12-26 ENCOUNTER — Other Ambulatory Visit: Payer: Self-pay

## 2017-12-26 ENCOUNTER — Telehealth: Payer: Self-pay | Admitting: Gastroenterology

## 2017-12-26 ENCOUNTER — Encounter: Payer: Self-pay | Admitting: Gastroenterology

## 2017-12-26 NOTE — Telephone Encounter (Signed)
Please arrange routine office visit in 4 months. History of PUD.

## 2017-12-26 NOTE — Patient Outreach (Signed)
[facility]) Care Management  12/26/2017  Megan LeekVirginia A [name] 04/26/1951 [id number]  67 year old  [facility]  [facility] requested for medication management due to patient having difficulty managing her medications.  PMHx includes, but not limited to, hypertension, atrial fibrillation, GERD, upper GI bleeding, diverticulosis of colon, Type 2 diabetes mellitus, h/o CVA, chronic pain and long term anticoagulation.  Successful outreach to Megan [name].  HIPAA identifiers verified.    Subjective: Megan [name] states that she is confused about what medications she takes and that her doctor told her to discontinue several of her medications yesterday.  Plan: Home visit scheduled Friday 6/14 at 10 am.   Berlin HunJennifer Kariss Longmire, PharmD Clinical Pharmacist [facility] [phone]

## 2017-12-26 NOTE — Telephone Encounter (Signed)
PATIENT SCHEDULED AND LETTER SENT  °

## 2017-12-27 ENCOUNTER — Other Ambulatory Visit: Payer: Self-pay

## 2017-12-27 NOTE — Patient Outreach (Signed)
Patient triggered Red on [facility] dishcharge Dashboard, notification sent to:  Gae DryFelicia McCray, RN & Berlin HunJennifer Mendenhall, [facility]

## 2017-12-27 NOTE — Patient Outreach (Signed)
[facility]) Care Management  12/27/2017  Megan [name] 08/19/1950 [id number]   Megan [name] is a 67 year old female.  Call placed to Megan [name] to complete Initial Transition of Care assessment. Member's spouse answered the call. Megan [name] reported that Megan [name] was asleep and requested a call back at another time.   PLAN RN CM will follow up within 3 business days.   Megan CabalFelecia Zahira Brummond,RN [facility] Care Management [facility] Care [facility] [phone]

## 2017-12-28 ENCOUNTER — Other Ambulatory Visit: Payer: Self-pay

## 2017-12-28 NOTE — Patient Outreach (Signed)
[facility]) Care Management  [facility] Care Manager  12/28/2017   Megan [name] Mar 19, 1951 [id number]   67 year old  referred to [facility] Care Management by physician  [facility] services requested for medication management due to patient having difficulty managing her medications.  PMHx includes, but not limited to, hypertension, atrial fibrillation, GERD, upper GI bleeding, diverticulosis of colon, Type 2 diabetes mellitus, h/o CVA, chronic pain and long term anticoagulation.  Successful home visit with  Megan [name] and her husband.  HIPAA identifiers verified.   Subjective:  Megan [name] reports being very overwhelmed by all her prescription medications.  She has multiple bottles of the same medications.  She requested that I help establish compliance packaging from [facility], which is the pharmacy that she uses.  Megan [name] states that her nephew, an EMT helps her with her medications.   Objective:  SCr 1.22 mg/dL on 01/22/28 FAO1H 0.8% on 6/619  Medications Reviewed Today    Reviewed by Hurley Cisco, [facility] (Pharmacist) on 12/28/17 at 1952  Med List Status: <None>  Medication Order Taking? Sig Documenting Provider Last Dose Status Informant  acetaminophen (TYLENOL) 325 MG tablet [id number] Yes Take 2 tablets (650 mg total) by mouth every 6 (six) hours as needed for mild pain, moderate pain or headache. Cleora Fleet, MD Taking Active Self  apixaban (ELIQUIS) 5 MG TABS tablet [id number] Yes Take 1 tablet (5 mg total) by mouth 2 (two) times daily. Restart on 7/14 Erick Blinks, MD Taking Active Self           Med Note Marion Downer   MWN Sep 23, 2016  1:48 PM)    atorvastatin (LIPITOR) 80 MG tablet [id number] Yes Take 80 mg by mouth daily. [provider] Taking Active Self  Cholecalciferol (VITAMIN D-3 PO) [id number] Yes Take 2,000 Units by mouth daily.  [provider] Taking Active Self  enalapril (VASOTEC) 20 MG tablet  [id number] Yes Take 20 mg by mouth daily.  [provider] Taking Active Self  escitalopram (LEXAPRO) 10 MG tablet [id number] Yes Take 1 tablet (10 mg total) by mouth daily. Cleora Fleet, MD Taking Active Self  furosemide (LASIX) 40 MG tablet [id number] Yes Take 40 mg by mouth daily as needed. [provider] Taking Active   glipiZIDE (GLUCOTROL) 5 MG tablet [id number] Yes Take 1 tablet (5 mg total) by mouth daily before breakfast. In the AM Johnson, Clanford L, MD Taking Active Self  hydrochlorothiazide (HYDRODIURIL) 25 MG tablet [id number] Yes Take 25 mg by mouth daily. [provider] Taking Active   levETIRAcetam (KEPPRA) 250 MG tablet [id number] Yes Take 250 mg by mouth. [provider]  Active Self  levothyroxine (SYNTHROID, LEVOTHROID) 25 MCG tablet [id number] Yes Take 25 mcg by mouth daily. [provider] Taking Active Self           Med Note Henreitta Leber, PAMELA C   Sat Jul 17, 2015  3:58 PM)    metFORMIN (GLUCOPHAGE-XR) 500 MG 24 hr tablet [id number] Yes Take 1,000 mg by mouth 2 (two) times daily. [provider] Taking Active Self  metoprolol succinate (TOPROL-XL) 50 MG 24 hr tablet [id number] Yes Take 1 tablet (50 mg total) by mouth daily with lunch. Johnson, Clanford L, MD Taking Active   pantoprazole (PROTONIX) 40 MG tablet [id number] Yes TAKE (1) TABLET BY MOUTH TWICE DAILY BEFORE A MEAL. Anice Paganini, NP Taking Active Self  predniSONE (DELTASONE) 5 MG tablet [id number] Yes Take 5  mg by mouth daily. [provider] Taking Active Self          Functional Status:  In your present state of health, do you have any difficulty performing the following activities: 12/20/2017 11/18/2017  Hearing? N N  Vision? N N  Difficulty concentrating or making decisions? N Y  Walking or climbing stairs? Y Y  Dressing or bathing? N N  Doing errands, shopping? N Y  Some recent data might be hidden    Fall/Depression Screening: Fall Risk   10/28/2015 09/06/2015  Falls in the past year? Yes Yes  Comment - Fell/collapsed on the day she was hospitalized  with stroke.  Number falls in past yr: 2 or more 1  Comment 10 fALLS SINCE 08/2015 -  Injury with Fall? No Yes  Comment - has pain & swelling/states no breaks per xray in hospital  Risk for fall due to : - Medication side effect  Follow up - Follow up appointment  Comment - States she will have follow up with orthopedist  in 2 weeks.   PHQ 2/9 Scores 12/24/2017 09/06/2015  PHQ - 2 Score 0 0   ASSESSMENT: Date Discharged from Hospital: 12/21/12 Date Medication Reconciliation Performed: 12/28/2017  New Medications at Discharge:  Cefinir (completed)  Patient was recently discharged from hospital and all medications have been reviewed  Drugs sorted by system:  Neurologic/Psychologic: escitalpram  Cardiovascular: apixaban, atrovastatin, enalapril,  furosemide, hydrochlorthiazide, metoprolol  Pulmonary/Allergy: prednisone  Gastrointestinal: pantoprazole   Endocrine: glipizide, levothyroxine, metformin  Pain: acetaminophen   Vitamins/Minerals: cholecalciferol  Gaps in therapy:   Apixaban-multiple bottles found in home.  Patient with A filb and h/o stroke.  Discharge note in Epic from last hospitalization 6/7 says to resume on 7/14.  Patient reports no unusal bleeding or upcoming procedures until 7/5.  Is apixaban really being held for over a month in patient?  Other issues noted:   Enalapril - Med list from PCP stated enalapril 20 mg daily, but patient's prescription bottle was for 20 mg twice daily.  What is the correct frequency?  Glipizide- Patient had bottle of 10 mg tablets, but discharge note from 11/19/17 was for 5 mg daily.  Not sure if patient had been taking 5 mg or 10 mg of glipizide.   Pioglitazone Found in patient's medications and on recent sheet from PCP visit, but this medication was discontinued on discharge from hospitalization on 11/19/17 when she was  admitted for hypoglycemia and oversedation from polypharmacy.  Is patient supposed to take this medication?  Furosemide- Patient states that she takes furosemide 40 mg daily prn swelling, but none was in the home and drug not on discharge medication list from 12/21/17.   Furosemide 40mg  daily, not prn was on her medication sheet that she had from her recent PCP visit.  She requested a refill from [facility].   Hydrochlorothiazide- Patient states she takes 25 mg daily and has in home.  It was on her PCP list from last visit, but not of hospital discharge med list from 12/21/17.    Visit Notes: When I arrived at patient's home today, Carollee HerterShannon, RN from [facility] was finishing up a medication review.  She reported finding a lot of Ziploc bags with 1-2 random pills in them.  She had consolidated some of her "like" medications into one bottle.  She showed me the list from PCP visit on 12/25/17.   Shannon filled patient's pill box for Mrs. Keeble.  I called [facility] and  they confirmed that Mr. Przybysz could bring her medications back to their pharmacy and they would use them in her compliance packaging since they had originally filled the prescriptions.   All of her current medications were placed in a bag and Mr. Puebla reported that he would take it to [facility].    PLAN: Call placed to PCP, Dr. Sudie Bailey to clarify the above issues.  Follow up with MD Monday am.  Will update [facility] when I hear back from PCP to ensure they have correct information for compliance packaging.   Berlin Hun, PharmD Clinical Pharmacist [facility] [phone]

## 2017-12-31 ENCOUNTER — Other Ambulatory Visit: Payer: Self-pay

## 2017-12-31 ENCOUNTER — Ambulatory Visit: Payer: Self-pay

## 2017-12-31 NOTE — Patient Outreach (Signed)
[facility]N) Care Management  12/31/2017  Casimer LeekVirginia A Mimbs 02/06/1951 [id number]  [age]year [facility] Pharmacy services requested for medication management due to patient having difficulty managing her medications.PMHx includes, but not limited to,hypertension, atrial fibrillation, GERD, upper GI bleeding, diverticulosis of colon, Type 2 diabetes mellitus, h/o CVA, chronic pain and long term anticoagulation.  Call placed to Dr. Michelle NasutiKnowlton's office to try to clarify medication issues found at home visit on 12/28/17.  Per office staff, Dr. Sudie BaileyKnowlton is going to clarify the medication issues I documented in my home visit note at her next appointment on 6/25.   I asked if PCP could please let me know if patient is to continue to hold or resume her Eliquis.  Disharge note from last hospitalization said to hold until 7/14, but I am unsure reason why to hold for so long.  Office staff said they will have MD or NP call me today.  Call placed to Ms. Whiters and HIPAA identiers verified.   Informed her that I am waiting to hear back from Dr. Sudie BaileyKnowlton and that I will let her know about Elqiuis as soon as I hear back from PCP.  Plan: Await call from MD about Eliquis.

## 2018-01-01 ENCOUNTER — Other Ambulatory Visit: Payer: Self-pay

## 2018-01-01 NOTE — Patient Outreach (Signed)
   [facility]) Care Management  01/01/2018  Casimer LeekVirginia A Pratte 01/19/1951 [id number]  67year old [facility] by physicianTHN Pharmacy services requested for medication management due to patient having difficulty managing her medications.PMHx includes, but not limited to,hypertension, atrial fibrillation, GERD, upper GI bleeding, diverticulosis of colon, Type 2 diabetes mellitus, h/o CVA, chronic pain and long term anticoagulation.  Incoming call received from Strykerarol at Dr. Michelle NasutiKnowlton's office.  Office states that patient should continue to hold Eliquis until after her procedure on 01/18/18.   Dr. Sudie BaileyKnowlton will inform patient when she should resume after procedure.  Okey RegalCarol said Dr. Sudie BaileyKnowlton will evaluate all of Mrs. Franca's medications at her 6/25 visit.  Call placed to Mr. Pendelton.  HIPAA identifiers verified.  I informed him that [name] should continue holding the Eliquis and that Dr. Sudie BaileyKnowlton will tell them when she should restart this medication after her procedure.  He verbalized understanding.    Call placed to [facility] and relayed above information.  They state they will get one week pill pack ready to start this Friday based on what they currently have on file.  They are aware Dr. Sudie BaileyKnowlton may change her regimen next week and await his changes.  Plan: Follow up with Dr. Sudie BaileyKnowlton on 6/26 to clarify her medication regimen.  Will ask him to send in new prescriptions to [facility].  Berlin HunJennifer Ewart Carrera, PharmD Clinical Pharmacist [facility] [phone]

## 2018-01-04 ENCOUNTER — Other Ambulatory Visit: Payer: Self-pay

## 2018-01-04 NOTE — Patient Outreach (Signed)
[facility]) Care Management  01/04/2018  Megan [name] 02/07/1951 [id number]    Follow up call placed to member. RN CM spoke with member's spouse Mr. Megan [name]. HIPAA verifiers obtained. Per spouse, member was doing well, but not available at the time of the call. Mr. Megan [name] reported that Mrs. Megan [name] had several scheduled appointments in the next few weeks, but may be available on 01/08/18. He agreed to a home visit for [name] on 01/08/18 if there are no changes with her appointments. Contact information provided. Advised to contact RN CM for questions as needed.   PLAN Will follow up on 01/08/18 for Initial Home Visit.   Katha CabalFelecia Romero Letizia,RN [facility] Community Care Manager [phone]

## 2018-01-08 ENCOUNTER — Ambulatory Visit (HOSPITAL_COMMUNITY): Payer: Medicare Other

## 2018-01-08 ENCOUNTER — Other Ambulatory Visit: Payer: Self-pay

## 2018-01-08 NOTE — Patient Outreach (Addendum)
[facility]N) Care Management  01/08/2018  Megan LeekVirginia A [name] 12/14/1950 [id number]    RN CM attempted to contact [name] to confirm her scheduled initial home visit. Unable to reach member. Left HIPAA compliant voice message requesting a call back.   PLAN  Will follow up with member within 3 business days.   Katha CabalFelecia Lakeishia Truluck,RN [facility] Community Care Manager [phone]

## 2018-01-09 ENCOUNTER — Other Ambulatory Visit: Payer: Self-pay

## 2018-01-09 ENCOUNTER — Ambulatory Visit: Payer: Self-pay

## 2018-01-09 NOTE — Patient Outreach (Signed)
[facility] Care Management  [facility]   01/09/2018  Megan LeekVirginia A [name] 08/15/1950 [id number]  [age]year old [facility] by physicianTHN Pharmacy services requested for medication management due to patient having difficulty managing her medications.PMHx includes, but not limited to,hypertension, atrial fibrillation, GERD, upper GI bleeding, diverticulosis of colon, Type 2 diabetes mellitus, h/o CVA, chronic pain and long term anticoagulation.  Successful outreach attempt to Megan [name].  HIPAA identifiers verified.  Subjective: Megan [name] reports that she saw her PCP, Dr. Sudie BaileyKnowlton yesterday.  She states that he told her to resume Eliquis.  She reports that he gave her an updated medication list, but states that she can't read it because the lettering is too small.  She states that she likes her compliance packaged medications and reports that it has made it so much easier for her to manage her medications.   Medication Management: Call made to Dr. Michelle NasutiKnowlton's office to request a copy of her updated medication list.  I have also requested that they fax a copy to [facility] today.   I notified Megan [name] at the pharmacy that she should expect an updated medication list.  She states that she will follow up with Dr. Michelle NasutiKnowlton's office if needed because Megan [name] will need a new pill by the end of the week.  Current Medications:  AS PROVIDED BY DR. Michelle NasutiKNOWLTON'S OFFICE ON 01/09/18 Current Outpatient Medications  Medication Sig Dispense Refill  . acetaminophen (TYLENOL) 325 MG tablet Take 2 tablets (650 mg total) by mouth every 6 (six) hours as needed for mild pain, moderate pain or headache.    Marland Kitchen. apixaban (ELIQUIS) 5 MG TABS tablet Take 1 tablet (5 mg total) by mouth 2 (two) times daily. Restart on 7/14 60 tablet 2  . atorvastatin (LIPITOR) 80 MG tablet Take 80 mg by mouth daily.    . Cholecalciferol (VITAMIN D-3 PO) Take 2,000 Units by  mouth daily.     . enalapril (VASOTEC) 20 MG tablet Take 20 mg by mouth daily.     Marland Kitchen. escitalopram (LEXAPRO) 10 MG tablet Take 1 tablet (10 mg total) by mouth daily.    . furosemide (LASIX) 40 MG tablet Take 40-80 mg by mouth daily.     Marland Kitchen. glipiZIDE (GLUCOTROL) 5 MG tablet Take 1 tablet (5 mg total) by mouth daily before breakfast. In the AM    . hydrochlorothiazide (HYDRODIURIL) 25 MG tablet Take 25 mg by mouth daily.    Marland Kitchen. levothyroxine (SYNTHROID, LEVOTHROID) 25 MCG tablet Take 25 mcg by mouth daily.    . metFORMIN (GLUCOPHAGE-XR) 500 MG 24 hr tablet Take 1,000 mg by mouth 2 (two) times daily.    . metoprolol succinate (TOPROL-XL) 50 MG 24 hr tablet Take 1 tablet (50 mg total) by mouth daily with lunch.    . pantoprazole (PROTONIX) 40 MG tablet TAKE (1) TABLET BY MOUTH TWICE DAILY BEFORE A MEAL. 180 tablet 1  . pioglitazone (ACTOS) 15 MG tablet Take 15 mg by mouth daily.    . predniSONE (DELTASONE) 5 MG tablet Take 5 mg by mouth daily.     No current facility-administered medications for this visit.    Assessment: Informed patient that I [facility] as she has no further medication issues or questions at this time.  Megan [name] is aware that she can call me in the future should medication issues arise.  Plan: Route discipline closure letter to Dr. Sudie BaileyKnowlton.  Inform [facility] RN, Megan [name] of [facility] case closure.  Berlin Hun, PharmD Clinical Pharmacist [facility] [phone]

## 2018-01-11 ENCOUNTER — Other Ambulatory Visit: Payer: Self-pay

## 2018-01-11 NOTE — Patient Outreach (Signed)
[facility]) Care Management  01/11/2018  Casimer LeekVirginia A Pasquarello 09/20/1950 [id number]   Unsuccessful outreach call placed to Mrs. Hanahan. RN CM left HIPAA compliant message requesting a return call.   PLAN RN CM will attempt to reach member on next week.   Katha CabalFelecia Latona Krichbaum,RN [facility] Community Care Manager [phone]

## 2018-01-18 ENCOUNTER — Encounter (HOSPITAL_COMMUNITY): Payer: Self-pay

## 2018-01-18 ENCOUNTER — Other Ambulatory Visit: Payer: Self-pay

## 2018-01-18 ENCOUNTER — Ambulatory Visit (HOSPITAL_COMMUNITY): Payer: Medicare Other

## 2018-01-18 ENCOUNTER — Ambulatory Visit (HOSPITAL_COMMUNITY)
Admission: RE | Admit: 2018-01-18 | Discharge: 2018-01-18 | Disposition: A | Discharge status: Home / Self Care | Payer: Medicare Other | Source: Ambulatory Visit | Attending: Internal Medicine | Admitting: Internal Medicine

## 2018-01-18 DIAGNOSIS — R0602 Shortness of breath: Secondary | ICD-10-CM

## 2018-01-18 MED ORDER — NITROGLYCERIN 0.4 MG SL SUBL
SUBLINGUAL_TABLET | SUBLINGUAL | Status: AC
  Filled 2018-01-18: qty 2

## 2018-01-18 MED ORDER — METOPROLOL TARTRATE 5 MG/5ML IV SOLN
INTRAVENOUS | Status: AC
  Filled 2018-01-18: qty 15

## 2018-01-18 MED ORDER — IOPAMIDOL (ISOVUE-370) INJECTION 76%
INTRAVENOUS | Status: AC
  Filled 2018-01-18: qty 100

## 2018-01-18 NOTE — Patient Outreach (Signed)
[facility]) Care Management  01/18/2018  Megan LeekVirginia A [name] 10/30/1950 [id number]   3rd Outreach Attempt No return calls received after previous attempts. 3rd outreach call placed to [name]. Attempt Unsuccessful. RN CM left a HIPAA compliant voice message requesting a return call.   PLAN Member status on hold.  RN CM will complete case closure in 10 business days if no response is received.    Katha CabalFelecia Milaina Sher,RN [facility] Community Care Manager [phone]

## 2018-01-21 NOTE — Progress Notes (Signed)
Set pt up for stress/Lexiscan myovue CT cancelled due to frequent ectopy Hx DM and SOB    R/O ischemia

## 2018-01-28 ENCOUNTER — Telehealth: Payer: Self-pay | Admitting: *Deleted

## 2018-01-28 DIAGNOSIS — E118 Type 2 diabetes mellitus with unspecified complications: Secondary | ICD-10-CM

## 2018-01-28 DIAGNOSIS — R0602 Shortness of breath: Secondary | ICD-10-CM

## 2018-01-28 NOTE — Telephone Encounter (Signed)
Message from Dr. Tenny Crawoss: Set pt up for stress/Lexiscan myovue CT cancelled due to frequent ectopy Hx DM and SOB    R/O ischemia    Placed order for stress myoview. Left detailed message (DPR) on home ans mach that stress test has been ordered and to be expecting a call from one of our schedulers, and to call back with questions/concerns.

## 2018-01-28 NOTE — Telephone Encounter (Signed)
-----   Message from Pricilla RifflePaula Ross V, MD sent at 01/21/2018  4:52 PM EDT -----   ----- Message ----- From: Wendall StadeNishan, Peter C, MD Sent: 01/18/2018   2:34 PM To: Pricilla RifflePaula Ross V, MD, Nori Riisatherine A Carlton, RN, #  Patient showed up for cardiac CT and was having way too many PAC;s to perform She was not very cooperative and also doubt she could lay flat with her arms over head. ? Last note did not mention any chest pain would either observe or do nuclear study cancelled CT

## 2018-02-01 ENCOUNTER — Other Ambulatory Visit: Payer: Self-pay

## 2018-02-01 NOTE — Patient Outreach (Signed)
[facility]N) Care Management  02/01/2018  Megan [name] 08/31/1950 [id number]    Case Closure due to inability to maintain contact with patient. [name] is not receiving services from other [facility] disciplines at this time.   PLAN RN CM will complete case closure. Will route closure letter to physician.   Katha CabalFelecia Shaconda Hajduk,RN [facility] Community Care Manager [phone]

## 2018-02-06 ENCOUNTER — Telehealth (HOSPITAL_COMMUNITY): Payer: Self-pay | Admitting: Internal Medicine

## 2018-02-11 NOTE — Telephone Encounter (Signed)
User: Megan [name], [name] Date/time: 02/06/18 10:29 AM  Comment: Called pt and lmsg for her to CB to get sch for a [email or web address]  Context:  Outcome: Left Message  Phone number: [phone] Phone Type: Home Phone  Comm. type: Telephone Call type: Outgoing  Contact: Verner MouldEastwood, [place] A Relation to patient: Self

## 2018-02-13 ENCOUNTER — Telehealth (HOSPITAL_COMMUNITY): Payer: Self-pay | Admitting: *Deleted

## 2018-02-13 NOTE — Telephone Encounter (Signed)
Patient given detailed instructions per Myocardial Perfusion Study Information Sheet for the test on 02/18/18. Patient notified to arrive 15 minutes early and that it is imperative to arrive on time for appointment to keep from having the test rescheduled.  If you need to cancel or reschedule your appointment, please call the office within 24 hours of your appointment. . Patient verbalized understanding. Nishant Schrecengost Jacqueline    

## 2018-02-18 ENCOUNTER — Ambulatory Visit (HOSPITAL_COMMUNITY): Payer: Medicare Other | Attending: Cardiology

## 2018-02-18 DIAGNOSIS — E118 Type 2 diabetes mellitus with unspecified complications: Secondary | ICD-10-CM | POA: Insufficient documentation

## 2018-02-18 DIAGNOSIS — R0602 Shortness of breath: Secondary | ICD-10-CM | POA: Diagnosis present

## 2018-02-18 LAB — MYOCARDIAL PERFUSION IMAGING
CHL CUP NUCLEAR SDS: 3
CHL CUP NUCLEAR SRS: 4
CHL CUP RESTING HR STRESS: 68 {beats}/min
LV dias vol: 68 mL (ref 46–106)
LV sys vol: 23 mL
Peak HR: 83 {beats}/min
RATE: 0.38
SSS: 7
TID: 0.97

## 2018-02-18 MED ORDER — TECHNETIUM TC 99M TETROFOSMIN IV KIT
30.7000 | PACK | Freq: Once | INTRAVENOUS | Status: AC | PRN
  Administered 2018-02-18: 30.7 via INTRAVENOUS
  Filled 2018-02-18: qty 31

## 2018-02-18 MED ORDER — TECHNETIUM TC 99M TETROFOSMIN IV KIT
10.2000 | PACK | Freq: Once | INTRAVENOUS | Status: AC | PRN
  Administered 2018-02-18: 10.2 via INTRAVENOUS
  Filled 2018-02-18: qty 11

## 2018-02-18 MED ORDER — REGADENOSON 0.4 MG/5ML IV SOLN
0.4000 mg | Freq: Once | INTRAVENOUS | Status: AC
  Administered 2018-02-18: 0.4 mg via INTRAVENOUS

## 2018-02-28 IMAGING — CT CT HEAD W/O CM
3 of 7 series · 14 of 47 positions shown, 17 images · non-contrast
Comparison: Head CT December 22, 2015

CLINICAL DATA: Pain following fall

EXAM:
CT HEAD WITHOUT CONTRAST
CT MAXILLOFACIAL WITHOUT CONTRAST
TECHNIQUE: Multidetector CT imaging of the head and maxillofacial structures
were performed using the standard protocol without intravenous
contrast. Multiplanar CT image reconstructions of the maxillofacial
structures were also generated.

[Series 4: coronal · coronal · 0.32mm/px · 3 of 73 slices shown]
[im 19/73  brain]
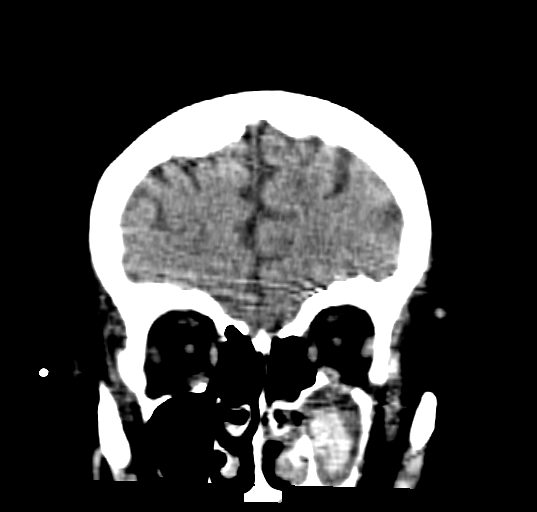
[im 37/73  brain]
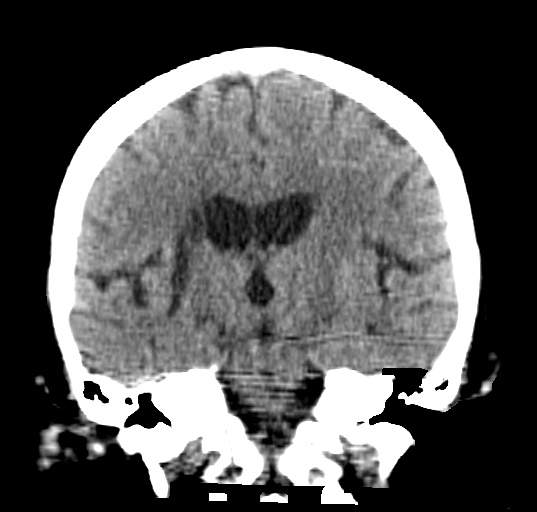
[im 55/73  brain]
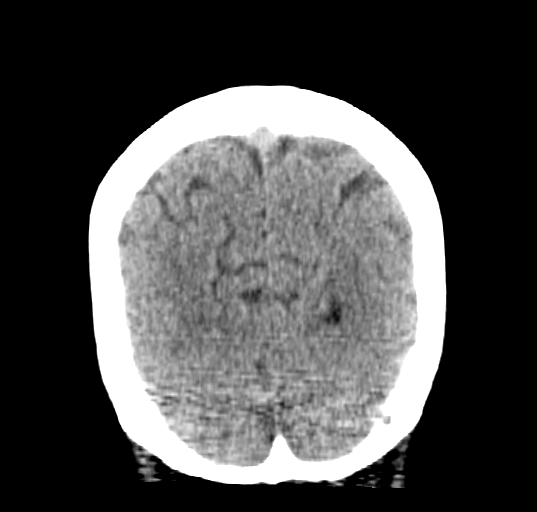

[Series 6: max soft · axial · 0.31mm/px · z∈[+53,+203]mm · 9 of 87 slices shown, 12 images]
[im 6/87  brain]
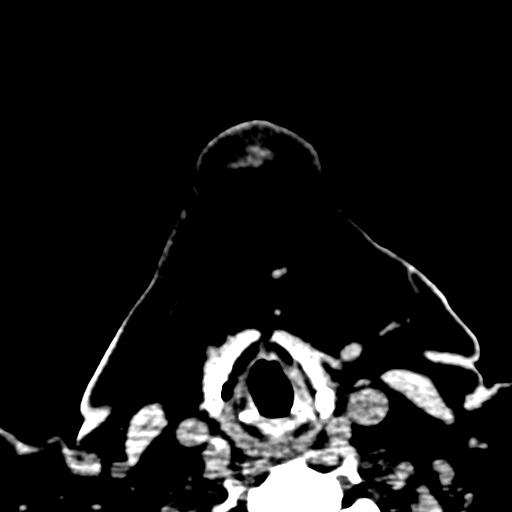
[im 6/87  bone]
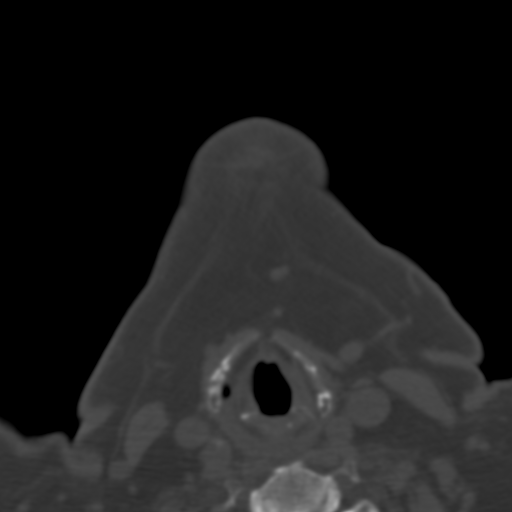
[im 18/87  brain]
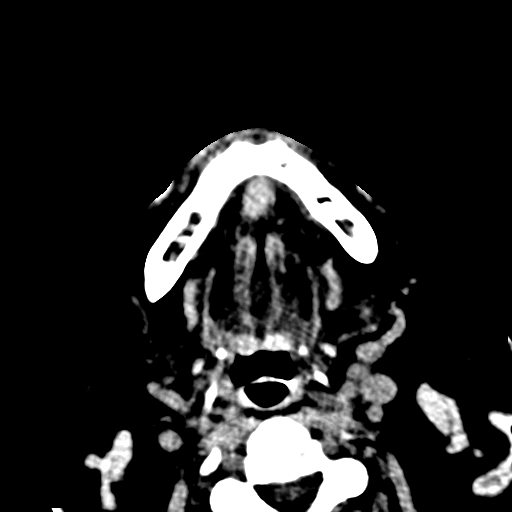
[im 23/87  brain]
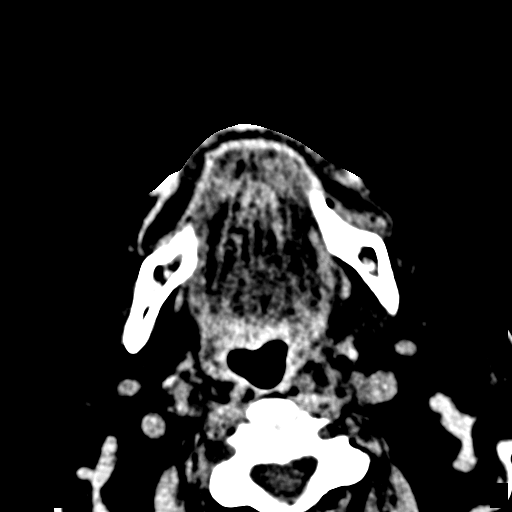
[im 35/87  brain]
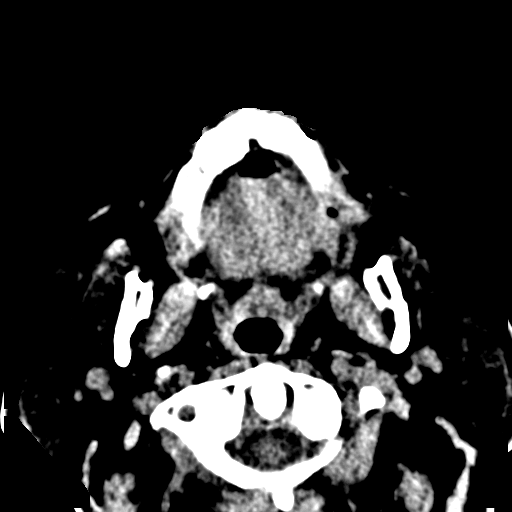
[im 46/87  brain]
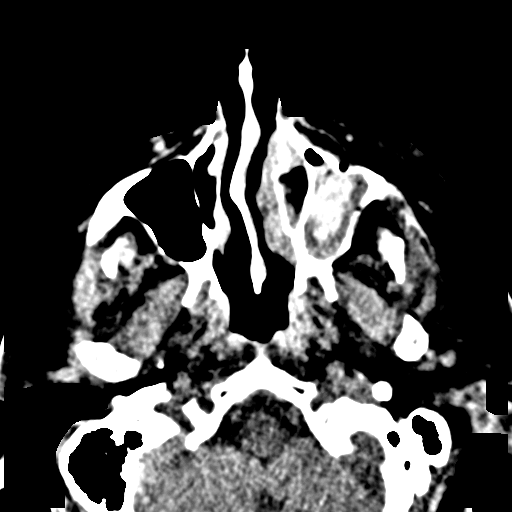
[im 46/87  bone]
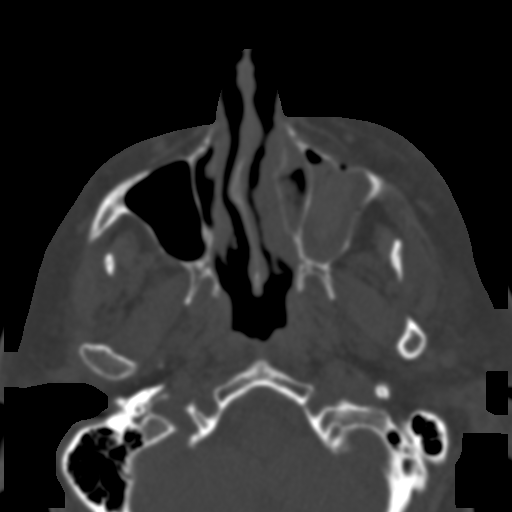
[im 52/87  brain]
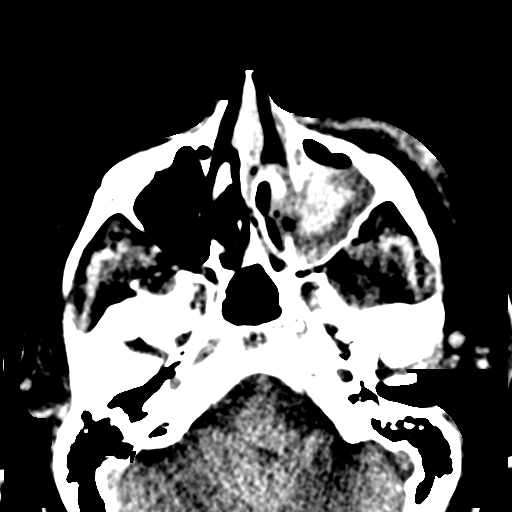
[im 64/87  brain]
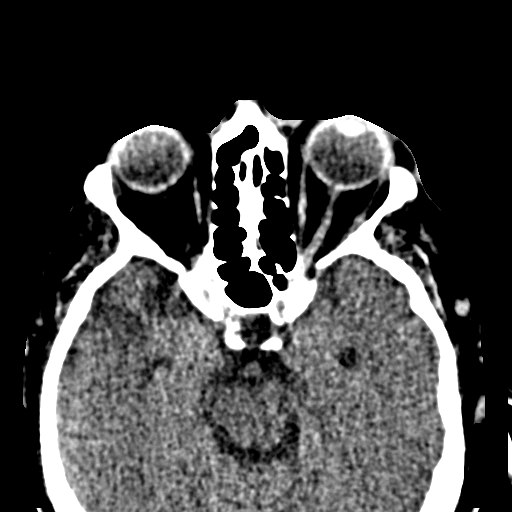
[im 69/87  brain]
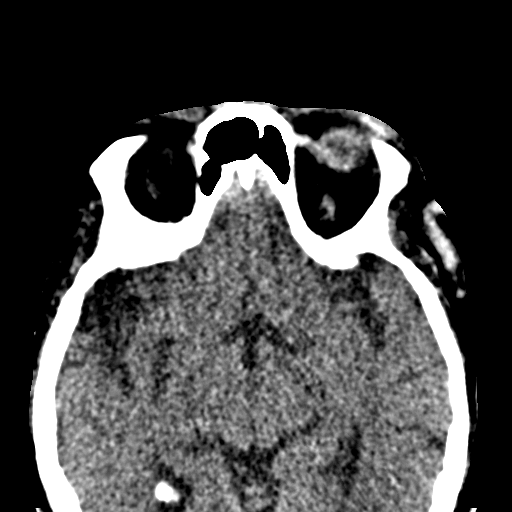
[im 81/87  brain]
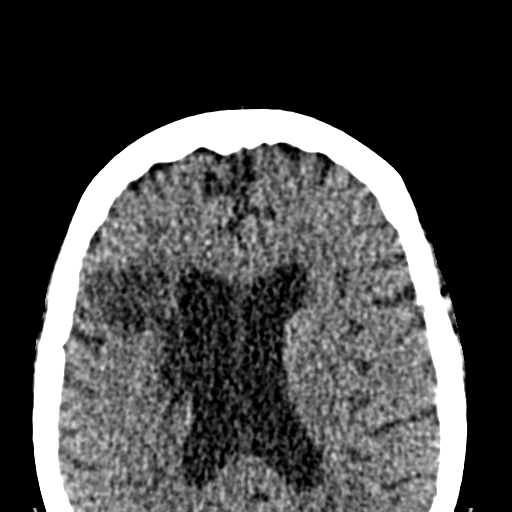
[im 81/87  bone]
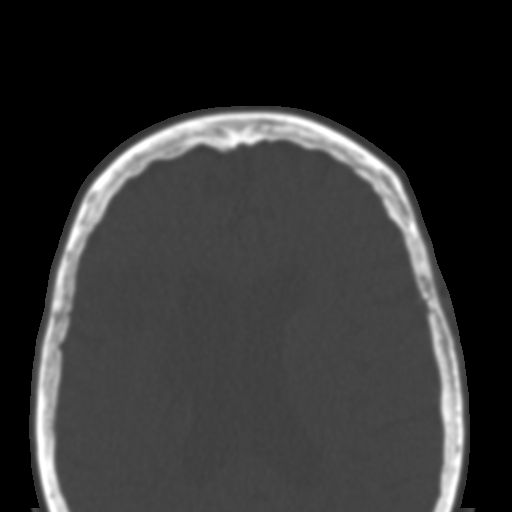

[Series 9: sagittal soft · sagittal · 0.33mm/px · 2 of 81 slices shown]
[im 27/81  brain]
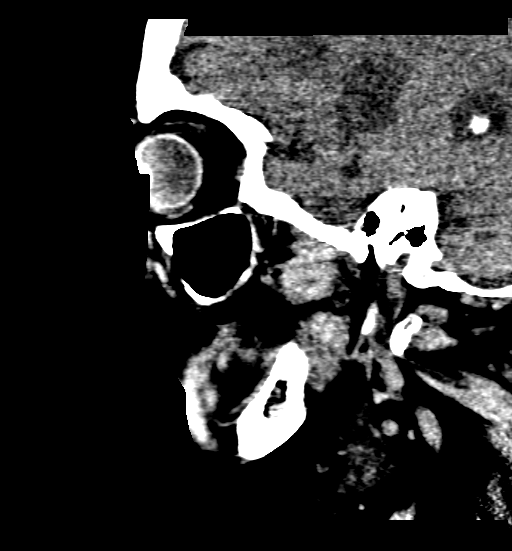
[im 54/81  brain]
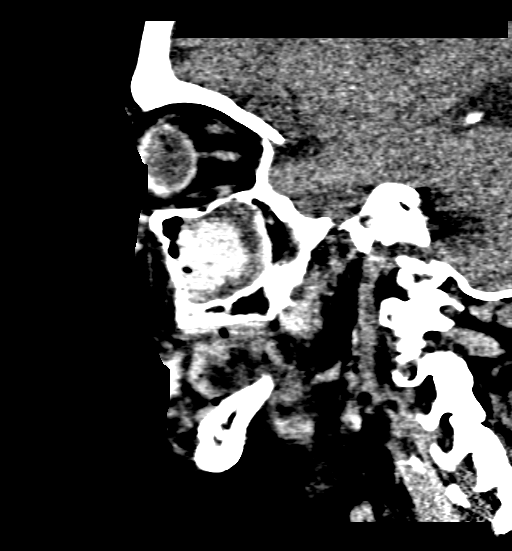

[14 of 47 positions shown; findings below may reference images not displayed]

FINDINGS: CT HEAD FINDINGS

Mild diffuse atrophy is stable. There is no intracranial mass,
hemorrhage, extra-axial fluid collection, or midline shift. There is
a prior infarct involving much of the right lentiform nucleus as
well as essentially all of the right internal and external capsules.
There is evidence of a prior infarct in the anterior right temporal
lobe as well as in the right posterior frontal lobe, stable. There
is slight small vessel disease in the centra semiovale bilaterally.
No new gray-white compartment lesions are evident. The bony
calvarium appears intact. The mastoid air cells are clear.

CT MAXILLOFACIAL FINDINGS

There is a nondisplaced fracture of the anterior left maxillary
antrum. There is a mildly displaced fracture along the inferior
aspect of the lateral maxillary antrum with the more superior bony
fragment displaced medially with respect to the inferior bony
fragment in this area. There is diffuse opacification of the left
maxillary antrum. The left orbital floor appears intact as does the
medial wall of the left maxillary antrum. No other fractures are
evident. No dislocation. There is diffuse soft tissue swelling over
the left mid upper face with developing areas of hematoma in the
left mid upper face. There is also preseptal edema over the left
orbit. There is no intraorbital lesion. Orbits appear symmetric
bilaterally.

Paranasal sinuses are clear apart from the apparent hemorrhage in
the left maxillary antrum. The ostiomeatal unit complex on the left
is obstructed. The ostiomeatal unit complex the right is patent.
There is probable hemorrhage in the left near ease with edema of the
left nasal turbinate. There is rightward deviation of the nasal
septum.

Salivary glands appear normal. No adenopathy is evident. Visualized
pharynx appears unremarkable.
IMPRESSION: CT head: Atrophy with prior right-sided infarcts, stable. Mild
periventricular small vessel disease. No intracranial mass,
hemorrhage, or extra-axial fluid collection. No acute infarct
evident.

CT maxillofacial: Fractures of the anterior and lateral left
maxillary antrum. There is mild displacement of fracture fragments
along the inferior aspect of the lateral maxillary antrum. The
anterior maxillary antral fracture is nondisplaced. No other
fractures are evident. No dislocations. There is moderate soft
tissue swelling over the left face with developing hematoma. There
is preseptal edema over the left orbit. There is no intraorbital
lesion on either side. Paranasal sinuses are clear except for the
hemorrhage in the left maxillary antrum. Partial obstruction of the
left naris is probably also due to hemorrhage. There is obstruction
of the ostiomeatal unit complex on the left. The ostiomeatal unit
complex on the right is patent. Rightward deviation of nasal septum
noted.

## 2018-04-19 ENCOUNTER — Encounter (HOSPITAL_COMMUNITY): Payer: Self-pay

## 2018-05-01 ENCOUNTER — Encounter: Payer: Self-pay | Admitting: Gastroenterology

## 2018-05-01 ENCOUNTER — Encounter

## 2018-05-01 ENCOUNTER — Ambulatory Visit (INDEPENDENT_AMBULATORY_CARE_PROVIDER_SITE_OTHER): Payer: Medicare Other | Admitting: Gastroenterology

## 2018-05-01 ENCOUNTER — Other Ambulatory Visit: Payer: Self-pay

## 2018-05-01 VITALS — BP 164/85 | HR 68 | Temp 97.5°F | Ht 62.0 in | Wt 192.0 lb

## 2018-05-01 DIAGNOSIS — R1033 Periumbilical pain: Secondary | ICD-10-CM

## 2018-05-01 DIAGNOSIS — Z860100 Personal history of colon polyps, unspecified: Secondary | ICD-10-CM

## 2018-05-01 DIAGNOSIS — K279 Peptic ulcer, site unspecified, unspecified as acute or chronic, without hemorrhage or perforation: Secondary | ICD-10-CM | POA: Diagnosis not present

## 2018-05-01 NOTE — Progress Notes (Signed)
CC'D TO PCP °

## 2018-05-01 NOTE — Assessment & Plan Note (Addendum)
Healed site at time of last EGD. Continue PPI therapy. No concerning signs. Return in 1 year. As of note, left hand edema/left wrist edema s/p fall. Likely sprain. Doubt any fracture. Declining xrays despite recommendation.

## 2018-05-01 NOTE — Progress Notes (Signed)
Referring Provider: [name], MD Primary Care Physician:  [name], MD Primary GI: Dr. Jena Gauss   Chief Complaint  Patient presents with  . Peptic Ulcer Disease    HPI:   Megan [name] is a 67 y.o. female presenting today with a history of GI bleed in setting of Eliquis,with findings oferosive gastropathy, non-bleeding gastric ulcers and 1 oozing gastric ulcer with adherent clot on endoscopy. Brief hospitalization thereafter with colonoscopy revealing bleeding Dieulafoy's lesion s/p clips X 3, with next colonoscopy in 5 years due to family history of colon cancer (due 2022)EGD(Oct 2017)for surveillance with healed gastric ulcer, normal esophagus.  At last visit, she was sent to the ED due to hypotension and confusion in setting of polypharmacy.   Has discomfort if eating spaghetti and salad. No rectal bleeding. No constipation or diarrhea. Protonix BID. Tries to take 30 minutes before meals but sometimes not able to. No confusion. Likes her pills in the pill pack. No overt GI bleeding. Occasionally has to chew slower so food doesn't hang up but very rare.   Notes a "knot" below her umbilicus. States uncomfortable at times and protrudes. Wants this taken care of.   She fell recently and caught herself on her hands. Left hand is swollen, left wrist. Able to move without difficulty. Discomfort but no significant pain. Improving. Doesn't want to have an xray.   Past Medical History:  Diagnosis Date  . Agoraphobia with panic disorder   . Anxiety   . Arterial hypotension 12/20/2017  . Arthritis   . Chronic pain 03/08/15   Hydrocodone-APAP 10/325 mg, # 120 q month  . Colon polyps   . Complication of anesthesia   . Depression   . DM (diabetes mellitus) (HCC)   . GERD (gastroesophageal reflux disease)   . HTN (hypertension)   . Hypothyroidism   . Obesity   . PAF (paroxysmal atrial fibrillation) (HCC)   . PONV (postoperative nausea and vomiting)   . Stroke (HCC)  08/20/2014   left sided weakness; some memory loss.    Past Surgical History:  Procedure Laterality Date  . ABDOMINAL HYSTERECTOMY  15 yrs ago   with bso at aph  . CARDIAC CATHETERIZATION  2004   normal  . CHOLECYSTECTOMY  25 yrs ago   [facility]  . COLONOSCOPY  05/04/11   left-sided colonic diverticulosis  . COLONOSCOPY N/A 01/27/2016   diverticulosis in sigmoid colon and descending colon, active bleeding Dieulafoy lesion s/p clips X 3, next colonoscopy in 5 years due to family history of colon cancer    . ESOPHAGOGASTRODUODENOSCOPY  05/04/2011   Abnormal distal esophagus, biopsies consistent with inflammation due to acid reflux. No evidence of Barrett's. No H. pylori  . ESOPHAGOGASTRODUODENOSCOPY N/A 01/22/2016   erosive gastropathy, non-bleeding gastric ulcers and 1 oozing gastric ulcer with adherent clot s/p heater probe. Negative H.pylori serology.   . ESOPHAGOGASTRODUODENOSCOPY (EGD) WITH PROPOFOL N/A 04/27/2016   Dr. Jena Gauss: healed ulcers. normal esophagus  . RADIOLOGY WITH ANESTHESIA N/A 08/20/2015   Procedure: RADIOLOGY WITH ANESTHESIA;  Surgeon: Julieanne Cotton, MD;  Location: [facility] OR;  Service: Radiology;  Laterality: N/A;    Current Outpatient Medications  Medication Sig Dispense Refill  . acetaminophen (TYLENOL) 325 MG tablet Take 2 tablets (650 mg total) by mouth every 6 (six) hours as needed for mild pain, moderate pain or headache.    Marland Kitchen apixaban (ELIQUIS) 5 MG TABS tablet Take 1 tablet (5 mg total) by mouth 2 (two) times daily. Restart on 7/14  60 tablet 2  . atorvastatin (LIPITOR) 80 MG tablet Take 80 mg by mouth daily.    . Cholecalciferol (VITAMIN D-3 PO) Take 2,000 Units by mouth daily.     . enalapril (VASOTEC) 20 MG tablet Take 20 mg by mouth daily.     Marland Kitchen escitalopram (LEXAPRO) 10 MG tablet Take 1 tablet (10 mg total) by mouth daily.    . hydrochlorothiazide (HYDRODIURIL) 25 MG tablet Take 25 mg by mouth daily.    Marland Kitchen levothyroxine (SYNTHROID, LEVOTHROID) 25 MCG tablet Take  25 mcg by mouth daily.    . metFORMIN (GLUCOPHAGE-XR) 500 MG 24 hr tablet Take 1,000 mg by mouth 2 (two) times daily.    . metoprolol succinate (TOPROL-XL) 50 MG 24 hr tablet Take 1 tablet (50 mg total) by mouth daily with lunch.    . pantoprazole (PROTONIX) 40 MG tablet TAKE (1) TABLET BY MOUTH TWICE DAILY BEFORE A MEAL. 180 tablet 1  . pioglitazone (ACTOS) 15 MG tablet Take 15 mg by mouth daily.    . predniSONE (DELTASONE) 5 MG tablet Take 5 mg by mouth daily.    . Prenatal Vit-Fe Fumarate-FA (PREPLUS) 27-1 MG TABS Take 1 tablet by mouth daily.    . traZODone (DESYREL) 50 MG tablet Take 50 mg by mouth at bedtime.    . furosemide (LASIX) 40 MG tablet Take 40-80 mg by mouth daily.     Marland Kitchen glipiZIDE (GLUCOTROL) 5 MG tablet Take 1 tablet (5 mg total) by mouth daily before breakfast. In the AM (Patient not taking: Reported on 05/01/2018)     No current facility-administered medications for this visit.     Allergies as of 05/01/2018 - Review Complete 05/01/2018  Allergen Reaction Noted  . Naproxen Other (See Comments) 04/05/2011    Family History  Problem Relation Age of Onset  . Colon cancer Paternal Aunt        2, colon cancer  . Colon cancer Paternal Uncle        6, colon cancer  . Colon cancer Father        deceased, age 66  . Colon cancer Unknown        numerous cousins died before age 45 with colon cancer  . Colon cancer Sister        deceased, age 76  . Anesthesia problems Neg Hx   . Hypotension Neg Hx   . Malignant hyperthermia Neg Hx   . Pseudochol deficiency Neg Hx     Social History   Socioeconomic History  . Marital status: Married    Spouse name: Not on file  . Number of children: 3  . Years of education: Not on file  . Highest education level: Not on file  Occupational History  . Occupation: disability    Employer: NOT EMPLOYED  Social Needs  . Financial resource strain: Not on file  . Food insecurity:    Worry: Not on file    Inability: Not on file  .  Transportation needs:    Medical: Not on file    Non-medical: Not on file  Tobacco Use  . Smoking status: Former Smoker    Packs/day: 0.25    Years: 15.00    Pack years: 3.75    Types: Cigarettes    Start date: 07/17/1969    Last attempt to quit: 07/18/1971    Years since quitting: 46.8  . Smokeless tobacco: Former Neurosurgeon    Types: Snuff    Quit date: 08/20/2014  . Tobacco comment:  never more than 1-2 cig/day in entire life  Substance and Sexual Activity  . Alcohol use: No    Alcohol/week: 0.0 standard drinks  . Drug use: No  . Sexual activity: Yes    Birth control/protection: Surgical  Lifestyle  . Physical activity:    Days per week: Not on file    Minutes per session: Not on file  . Stress: Not on file  Relationships  . Social connections:    Talks on phone: Not on file    Gets together: Not on file    Attends religious service: Not on file    Active member of club or organization: Not on file    Attends meetings of clubs or organizations: Not on file    Relationship status: Not on file  Other Topics Concern  . Not on file  Social History Narrative   No contact with children since they left home at age 33. Doesn't know where they are.    Review of Systems: Gen: Denies fever, chills, anorexia. Denies fatigue, weakness, weight loss.  CV: Denies chest pain, palpitations, syncope, peripheral edema, and claudication. Resp: Denies dyspnea at rest, cough, wheezing, coughing up blood, and pleurisy. GI: see HPI  Derm: Denies rash, itching, dry skin Psych: Denies depression, anxiety, memory loss, confusion. No homicidal or suicidal ideation.  Heme: Denies bruising, bleeding, and enlarged lymph nodes.  Physical Exam: BP (!) 164/85   Pulse 68   Temp (!) 97.5 F (36.4 C) (Oral)   Ht 5\' 2"  (1.575 m)   Wt 192 lb (87.1 kg)   BMI 35.12 kg/m  General:   Alert and oriented. No distress noted. Pleasant and cooperative.  Head:  Normocephalic and atraumatic. Eyes:  Conjuctiva clear  without scleral icterus. Mouth:  Oral mucosa pink and moist.  Abdomen: limited exam. Unable to get on exam table. In chair, query possible umbilical hernia but very limited exam.  Msk:  Symmetrical without gross deformities. Normal posture. Extremities:  Left hand swollen. Able to move spontaneously. No pain with movement.  Neurologic:  Alert and  oriented x4 Psych:  Alert and cooperative. Normal mood and affect.

## 2018-05-01 NOTE — Patient Instructions (Signed)
Continue Protonix twice a day, but make sure it is 30 minutes before breakfast and dinner so it is absorbed best.  Please seek medical attention if you have worsening wrist/hand pain, or swelling/bruising.  I have referred you to Dr. Lovell Sheehan for further evaluation of possible hernia.  We will see you back in 1 year!  It was a pleasure to see you today. I strive to create trusting relationships with patients to provide genuine, compassionate, and quality care. I value your feedback. If you receive a survey regarding your visit,  I greatly appreciate you taking time to fill this out.   Gelene Mink, PhD, ANP-BC [facility]

## 2018-05-01 NOTE — Assessment & Plan Note (Signed)
Surveillance 2022.

## 2018-05-01 NOTE — Assessment & Plan Note (Signed)
Reports protrusion at umbilicus intermittently, discomfort. Very limited exam today in chair, as she was unable to get on exam table. Suspicious for hernia. Referral to Dr. Lovell Sheehan per her request.

## 2018-05-30 ENCOUNTER — Ambulatory Visit (INDEPENDENT_AMBULATORY_CARE_PROVIDER_SITE_OTHER): Payer: Medicare Other | Admitting: General Surgery

## 2018-05-30 ENCOUNTER — Encounter: Payer: Self-pay | Admitting: General Surgery

## 2018-05-30 ENCOUNTER — Other Ambulatory Visit (HOSPITAL_COMMUNITY): Payer: Self-pay | Admitting: Family Medicine

## 2018-05-30 ENCOUNTER — Ambulatory Visit (HOSPITAL_COMMUNITY)
Admission: RE | Admit: 2018-05-30 | Discharge: 2018-05-30 | Disposition: A | Discharge status: Home / Self Care | Payer: Medicare Other | Source: Ambulatory Visit | Attending: Family Medicine | Admitting: Family Medicine

## 2018-05-30 VITALS — BP 146/74 | HR 73 | Temp 96.4°F | Resp 20 | Wt 191.0 lb

## 2018-05-30 DIAGNOSIS — M79642 Pain in left hand: Secondary | ICD-10-CM | POA: Insufficient documentation

## 2018-05-30 DIAGNOSIS — X58XXXA Exposure to other specified factors, initial encounter: Secondary | ICD-10-CM | POA: Diagnosis not present

## 2018-05-30 DIAGNOSIS — S62002A Unspecified fracture of navicular [scaphoid] bone of left wrist, initial encounter for closed fracture: Secondary | ICD-10-CM | POA: Insufficient documentation

## 2018-05-30 DIAGNOSIS — K432 Incisional hernia without obstruction or gangrene: Secondary | ICD-10-CM

## 2018-05-30 NOTE — Patient Instructions (Signed)
 Ventral Hernia A ventral hernia is a bulge of tissue from inside the abdomen that pushes through a weak area of the muscles that form the front wall of the abdomen. The tissues inside the abdomen are inside a sac (peritoneum). These tissues include the small intestine, large intestine, and the fatty tissue that covers the intestines (omentum). Sometimes, the bulge that forms a hernia contains intestines. Other hernias contain only fat. Ventral hernias do not go away without surgical treatment. There are several types of ventral hernias. You may have:  A hernia at an incision site from previous abdominal surgery (incisional hernia).  A hernia just above the belly button (epigastric hernia), or at the belly button (umbilical hernia). These types of hernias can develop from heavy lifting or straining.  A hernia that comes and goes (reducible hernia). It may be visible only when you lift or strain. This type of hernia can be pushed back into the abdomen (reduced).  A hernia that traps abdominal tissue inside the hernia (incarcerated hernia). This type of hernia does not reduce.  A hernia that cuts off blood flow to the tissues inside the hernia (strangulated hernia). The tissues can start to die if this happens. This is a very painful bulge that cannot be reduced. A strangulated hernia is a medical emergency.  What are the causes? This condition is caused by abdominal tissue putting pressure on an area of weakness in the abdominal muscles. What increases the risk? The following factors may make you more likely to develop this condition:  Being female.  Being 60 or older.  Being overweight or obese.  Having had previous abdominal surgery, especially if there was an infection after surgery.  Having had an injury to the abdominal wall.  Having had several pregnancies.  Having a buildup of fluid inside the abdomen (ascites).  What are the signs or symptoms? The only symptom of a ventral  hernia may be a painless bulge in the abdomen. A reducible hernia may be visible only when you strain, cough, or lift. Other symptoms may include:  Dull pain.  A feeling of pressure.  Signs and symptoms of a strangulated hernia may include:  Increasing pain.  Nausea and vomiting.  Pain when pressing on the hernia.  The skin over the hernia turning red or purple.  Constipation.  Blood in the stool (feces).  How is this diagnosed? This condition may be diagnosed based on:  Your symptoms.  Your medical history.  A physical exam. You may be asked to cough or strain while standing. These actions increase the pressure inside your abdomen and force the hernia through the opening in your muscles. Your health care provider may try to reduce the hernia by pressing on it.  Imaging studies, such as an ultrasound or CT scan.  How is this treated? This condition is treated with surgery. If you have a strangulated hernia, surgery is done as soon as possible. If your hernia is small and not incarcerated, you may be asked to lose some weight before surgery. Follow these instructions at home:  Follow instructions from your health care provider about eating or drinking restrictions.  If you are overweight, your health care provider may recommend that you increase your activity level and eat a healthier diet.  Do not lift anything that is heavier than 10 lb (4.5 kg).  Return to your normal activities as told by your health care provider. Ask your health care provider what activities are safe for you. You   may need to avoid activities that increase pressure on your hernia.  Take over-the-counter and prescription medicines only as told by your health care provider.  Keep all follow-up visits as told by your health care provider. This is important. Contact a health care provider if:  Your hernia gets larger.  Your hernia becomes painful. Get help right away if:  Your hernia becomes  increasingly painful.  You have pain along with any of the following: ? Changes in skin color in the area of the hernia. ? Nausea. ? Vomiting. ? Fever. Summary  A ventral hernia is a bulge of tissue from inside the abdomen that pushes through a weak area of the muscles that form the front wall of the abdomen.  This condition is treated with surgery, which may be urgent depending on your hernia.  Do not lift anything that is heavier than 10 lb (4.5 kg), and follow activity instructions from your health care provider. This information is not intended to replace advice given to you by your health care provider. Make sure you discuss any questions you have with your health care provider. Document Released: 06/19/2012 Document Revised: 02/18/2016 Document Reviewed: 02/18/2016 Elsevier Interactive Patient Education  2018 [employer]vier Inc[employer]  

## 2018-05-30 NOTE — Progress Notes (Signed)
Megan LeekVirginia A [name]; [id number]; 05/08/1951   HPI Patient is a 67 year old white female with multiple medical problems who was referred to my care by Dr. Gareth MorganSteve Knowlton for evaluation treatment of an umbilical hernia.  She states the umbilical hernia is been present for some time.  It does stick out when she is straining or standing for long time, but does resolve with lying down.  She denies any nausea or vomiting.  She currently has a pain level of 4, but that is due to multiple other complaints.  She is on Eliquis for a history of atrial fibrillation and CVA.  She does have right-sided weakness due to her CVA.  She has never had an episode of the hernia sticking out and needing to be reduced in the emergency room. Past Medical History:  Diagnosis Date  . Agoraphobia with panic disorder   . Anxiety   . Arterial hypotension 12/20/2017  . Arthritis   . Chronic pain 03/08/15   Hydrocodone-APAP 10/325 mg, # 120 q month  . Colon polyps   . Complication of anesthesia   . Depression   . DM (diabetes mellitus) (HCC)   . GERD (gastroesophageal reflux disease)   . HTN (hypertension)   . Hypothyroidism   . Obesity   . PAF (paroxysmal atrial fibrillation) (HCC)   . PONV (postoperative nausea and vomiting)   . Stroke (HCC) 08/20/2014   left sided weakness; some memory loss.    Past Surgical History:  Procedure Laterality Date  . ABDOMINAL HYSTERECTOMY  15 yrs ago   with bso at aph  . CARDIAC CATHETERIZATION  2004   normal  . CHOLECYSTECTOMY  25 yrs ago   [facility]  . COLONOSCOPY  05/04/11   left-sided colonic diverticulosis  . COLONOSCOPY N/A 01/27/2016   diverticulosis in sigmoid colon and descending colon, active bleeding Dieulafoy lesion s/p clips X 3, next colonoscopy in 5 years due to family history of colon cancer    . ESOPHAGOGASTRODUODENOSCOPY  05/04/2011   Abnormal distal esophagus, biopsies consistent with inflammation due to acid reflux. No evidence of Barrett's. No H. pylori  .  ESOPHAGOGASTRODUODENOSCOPY N/A 01/22/2016   erosive gastropathy, non-bleeding gastric ulcers and 1 oozing gastric ulcer with adherent clot s/p heater probe. Negative H.pylori serology.   . ESOPHAGOGASTRODUODENOSCOPY (EGD) WITH PROPOFOL N/A 04/27/2016   Dr. Jena Gaussourk: healed ulcers. normal esophagus  . RADIOLOGY WITH ANESTHESIA N/A 08/20/2015   Procedure: RADIOLOGY WITH ANESTHESIA;  Surgeon: Julieanne CottonSanjeev Deveshwar, MD;  Location: [facility] OR;  Service: Radiology;  Laterality: N/A;    Family History  Problem Relation Age of Onset  . Colon cancer Paternal Aunt        2, colon cancer  . Colon cancer Paternal Uncle        6, colon cancer  . Colon cancer Father        deceased, age 68  . Colon cancer Unknown        numerous cousins died before age 68 with colon cancer  . Colon cancer Sister        deceased, age 68  . Anesthesia problems Neg Hx   . Hypotension Neg Hx   . Malignant hyperthermia Neg Hx   . Pseudochol deficiency Neg Hx     Current Outpatient Medications on File Prior to Visit  Medication Sig Dispense Refill  . diazepam (VALIUM) 5 MG tablet Take 5 mg by mouth every 6 (six) hours as needed for anxiety.    . prednisoLONE 5 MG TABS tablet Take by  mouth.    . acetaminophen (TYLENOL) 325 MG tablet Take 2 tablets (650 mg total) by mouth every 6 (six) hours as needed for mild pain, moderate pain or headache.    Marland Kitchen apixaban (ELIQUIS) 5 MG TABS tablet Take 1 tablet (5 mg total) by mouth 2 (two) times daily. Restart on 7/14 60 tablet 2  . atorvastatin (LIPITOR) 80 MG tablet Take 80 mg by mouth daily.    . Cholecalciferol (VITAMIN D-3 PO) Take 2,000 Units by mouth daily.     . enalapril (VASOTEC) 20 MG tablet Take 20 mg by mouth daily.     Marland Kitchen escitalopram (LEXAPRO) 10 MG tablet Take 1 tablet (10 mg total) by mouth daily.    . hydrochlorothiazide (HYDRODIURIL) 25 MG tablet Take 25 mg by mouth daily.    Marland Kitchen levothyroxine (SYNTHROID, LEVOTHROID) 25 MCG tablet Take 25 mcg by mouth daily.    . metFORMIN  (GLUCOPHAGE-XR) 500 MG 24 hr tablet Take 1,000 mg by mouth 2 (two) times daily.    . metoprolol succinate (TOPROL-XL) 50 MG 24 hr tablet Take 1 tablet (50 mg total) by mouth daily with lunch.    . pantoprazole (PROTONIX) 40 MG tablet TAKE (1) TABLET BY MOUTH TWICE DAILY BEFORE A MEAL. 180 tablet 1  . pioglitazone (ACTOS) 15 MG tablet Take 15 mg by mouth daily.    . predniSONE (DELTASONE) 5 MG tablet Take 5 mg by mouth daily.    . Prenatal Vit-Fe Fumarate-FA (PREPLUS) 27-1 MG TABS Take 1 tablet by mouth daily.     No current facility-administered medications on file prior to visit.     Allergies  Allergen Reactions  . Naproxen Other (See Comments)    Causes stomach burning    Social History   Substance and Sexual Activity  Alcohol Use No  . Alcohol/week: 0.0 standard drinks    Social History   Tobacco Use  Smoking Status Former Smoker  . Packs/day: 0.25  . Years: 15.00  . Pack years: 3.75  . Types: Cigarettes  . Start date: 07/17/1969  . Last attempt to quit: 07/18/1971  . Years since quitting: 46.8  Smokeless Tobacco Former Neurosurgeon  . Types: Snuff  . Quit date: 08/20/2014  Tobacco Comment   never more than 1-2 cig/day in entire life    Review of Systems  Constitutional: Negative.   HENT: Negative.   Eyes: Positive for blurred vision.  Respiratory: Negative.   Cardiovascular: Negative.   Gastrointestinal: Positive for abdominal pain.  Genitourinary: Positive for dysuria and frequency.  Musculoskeletal: Positive for back pain, joint pain and neck pain.  Skin: Negative.   Neurological: Positive for focal weakness.  Endo/Heme/Allergies: Bruises/bleeds easily.  Psychiatric/Behavioral: Negative.     Objective   Vitals:   05/30/18 1116  BP: (!) 146/74  Pulse: 73  Resp: 20  Temp: (!) 96.4 F (35.8 C)    Physical Exam  Constitutional: She is oriented to person, place, and time. She appears well-developed and well-nourished. No distress.  HENT:  Head: Normocephalic  and atraumatic.  Cardiovascular: Normal rate, regular rhythm and normal heart sounds.  Patient currently in normal sinus rhythm.  Pulmonary/Chest: Effort normal and breath sounds normal. No stridor. No respiratory distress. She has no wheezes. She has no rales.  Abdominal: Soft. Bowel sounds are normal. She exhibits no distension and no mass. There is no tenderness. There is no rebound and no guarding. A hernia is present.  A reducible umbilical hernia is present below an infraumbilical surgical scar.  Neurological: She is alert and oriented to person, place, and time.  Limited right arm movement noted due to CVA.  Skin: Skin is warm and dry.  Vitals reviewed.   Assessment  Incisional hernia, no history of incarceration. Atrial fibrillation history with chronic anticoagulation on Eliquis History of CVA Plan   I told the patient that she does have an incisional hernia, but the risks of fixing this hernia outweigh the benefits at this time.  Her risk of incarceration is low.  She is at increased risk for surgical intervention due to her history of CVA, atrial fibrillation, and chronic anticoagulation.  Literature was given concerning her hernia and the signs and symptoms of incarceration.  She understands this.  She will follow-up as needed.

## 2018-06-03 ENCOUNTER — Encounter: Payer: Self-pay | Admitting: Orthopedic Surgery

## 2018-06-03 ENCOUNTER — Ambulatory Visit (INDEPENDENT_AMBULATORY_CARE_PROVIDER_SITE_OTHER): Payer: Medicare Other | Admitting: Orthopedic Surgery

## 2018-06-03 VITALS — BP 143/75 | HR 81 | Ht 62.0 in | Wt 190.0 lb

## 2018-06-03 DIAGNOSIS — S62025A Nondisplaced fracture of middle third of navicular [scaphoid] bone of left wrist, initial encounter for closed fracture: Secondary | ICD-10-CM | POA: Diagnosis not present

## 2018-06-03 NOTE — Progress Notes (Signed)
Patient ID: Megan [name], female   DOB: 1950/08/06, 67 y.o.   MRN: [id number]  Chief Complaint  Patient presents with  . Wrist Injury    Left scaphoid fracture 05/13/18    HPI Megan [name] is a 67 y.o. female.   This 74 presents with a 74-week old scaphoid fracture says she fell about 2 to 3 weeks ago had an x-ray 2 weeks after that presents after referral from primary care.  She complains of pain over the left wrist near the scaphoid and base of the thumb.  Pain is dull pain is mild associated with some swelling   Review of Systems Review of Systems  Skin: Negative.   Neurological: Negative for numbness.     Past Medical History:  Diagnosis Date  . Agoraphobia with panic disorder   . Anxiety   . Arterial hypotension 12/20/2017  . Arthritis   . Chronic pain 03/08/15   Hydrocodone-APAP 10/325 mg, # 120 q month  . Colon polyps   . Complication of anesthesia   . Depression   . DM (diabetes mellitus) (HCC)   . GERD (gastroesophageal reflux disease)   . HTN (hypertension)   . Hypothyroidism   . Obesity   . PAF (paroxysmal atrial fibrillation) (HCC)   . PONV (postoperative nausea and vomiting)   . Stroke (HCC) 08/20/2014   left sided weakness; some memory loss.    Past Surgical History:  Procedure Laterality Date  . ABDOMINAL HYSTERECTOMY  15 yrs ago   with bso at aph  . CARDIAC CATHETERIZATION  2004   normal  . CHOLECYSTECTOMY  25 yrs ago   [facility]  . COLONOSCOPY  05/04/11   left-sided colonic diverticulosis  . COLONOSCOPY N/A 01/27/2016   diverticulosis in sigmoid colon and descending colon, active bleeding Dieulafoy lesion s/p clips X 3, next colonoscopy in 5 years due to family history of colon cancer    . ESOPHAGOGASTRODUODENOSCOPY  05/04/2011   Abnormal distal esophagus, biopsies consistent with inflammation due to acid reflux. No evidence of Barrett's. No H. pylori  . ESOPHAGOGASTRODUODENOSCOPY N/A 01/22/2016   erosive gastropathy, non-bleeding gastric  ulcers and 1 oozing gastric ulcer with adherent clot s/p heater probe. Negative H.pylori serology.   . ESOPHAGOGASTRODUODENOSCOPY (EGD) WITH PROPOFOL N/A 04/27/2016   Dr. Jena Gauss: healed ulcers. normal esophagus  . RADIOLOGY WITH ANESTHESIA N/A 08/20/2015   Procedure: RADIOLOGY WITH ANESTHESIA;  Surgeon: Julieanne Cotton, MD;  Location: [facility] OR;  Service: Radiology;  Laterality: N/A;      Physical Exam 1 Blood pressure (!) 143/75, pulse 81, height 5\' 2"  (1.575 m), weight 190 lb (86.2 kg). Physical Exam 2 The patient is well developed well nourished and well groomed. 3 Orientation to person place and time is normal  4 Mood is pleasant.  5 Ambulatory status normal  6 Inspection of the left wrist reveals  tenderness   over the thumb area with mild swelling and no deformity 7 Range of motion assessment: The range of motion is diminished primarily secondary to pain 8 Stability tests are deferred because of pain but the x-ray shows no subluxation of the joint 9 Strength assessment muscle tone is normal resistance testing is deferred because of pain and swelling  10 Nerve function normal 11 Vascular function excellent pulses color and temperature 12 Local lymphatic system negative epitrochlear  Opposite extremity right wrist there is no alignment abnormality, no contracture, no subluxation, no atrophy and neurovascular exam is intact   MEDICAL DECISION SECTION  xrays ordered?  No  My independent reading of xrays: Films taken left wrist on November 14 show a scaphoid fracture which looks to be partially healing to me  The wrist alignment is normal   Encounter Diagnosis  Name Primary?  . Closed nondisplaced fracture of middle third of scaphoid bone of left wrist, initial encounter Yes     PLAN: Normal wrist cast SEE AAOS board review regarding scaphoid fractures and cast versus spica cast  No orders of the defined types were placed in this encounter.  Cast for 6 weeks and then x-ray  out of plaster

## 2018-07-16 ENCOUNTER — Other Ambulatory Visit (HOSPITAL_COMMUNITY)
Admission: RE | Admit: 2018-07-16 | Discharge: 2018-07-16 | Disposition: A | Discharge status: Home / Self Care | Payer: Medicare Other | Source: Ambulatory Visit | Attending: Internal Medicine | Admitting: Internal Medicine

## 2018-07-16 ENCOUNTER — Ambulatory Visit (INDEPENDENT_AMBULATORY_CARE_PROVIDER_SITE_OTHER): Payer: Medicare Other | Admitting: Internal Medicine

## 2018-07-16 ENCOUNTER — Encounter: Payer: Self-pay | Admitting: Internal Medicine

## 2018-07-16 VITALS — BP 130/70 | HR 61 | Ht 62.0 in | Wt 200.0 lb

## 2018-07-16 DIAGNOSIS — I10 Essential (primary) hypertension: Secondary | ICD-10-CM | POA: Diagnosis not present

## 2018-07-16 DIAGNOSIS — E782 Mixed hyperlipidemia: Secondary | ICD-10-CM | POA: Insufficient documentation

## 2018-07-16 DIAGNOSIS — Z79899 Other long term (current) drug therapy: Secondary | ICD-10-CM | POA: Diagnosis not present

## 2018-07-16 DIAGNOSIS — Z7901 Long term (current) use of anticoagulants: Secondary | ICD-10-CM | POA: Insufficient documentation

## 2018-07-16 LAB — LIPID PANEL
Cholesterol: 175 mg/dL (ref 0–200)
HDL: 52 mg/dL (ref >40)
LDL CALC: 99 mg/dL (ref 0–99)
Total CHOL/HDL Ratio: 3.4 RATIO
Triglycerides: 119 mg/dL (ref <150)
VLDL: 24 mg/dL (ref 0–40)

## 2018-07-16 LAB — CBC WITH DIFFERENTIAL/PLATELET
Abs Immature Granulocytes: 0.01 10*3/uL (ref 0.00–0.07)
Basophils Absolute: 0 10*3/uL (ref 0.0–0.1)
Basophils Relative: 0 %
EOS PCT: 2 %
Eosinophils Absolute: 0.1 10*3/uL (ref 0.0–0.5)
HEMATOCRIT: 30.2 % — AB (ref 36.0–46.0)
HEMOGLOBIN: 9.2 g/dL — AB (ref 12.0–15.0)
Immature Granulocytes: 0 %
LYMPHS PCT: 23 %
Lymphs Abs: 1.3 10*3/uL (ref 0.7–4.0)
MCH: 29.2 pg (ref 26.0–34.0)
MCHC: 30.5 g/dL (ref 30.0–36.0)
MCV: 95.9 fL (ref 80.0–100.0)
MONO ABS: 0.2 10*3/uL (ref 0.1–1.0)
MONOS PCT: 4 %
Neutro Abs: 3.9 10*3/uL (ref 1.7–7.7)
Neutrophils Relative %: 71 %
PLATELETS: 147 10*3/uL — AB (ref 150–400)
RBC: 3.15 MIL/uL — AB (ref 3.87–5.11)
RDW: 13.9 % (ref 11.5–15.5)
WBC: 5.5 10*3/uL (ref 4.0–10.5)
nRBC: 0 % (ref 0.0–0.2)

## 2018-07-16 LAB — TSH: TSH: 4.138 u[IU]/mL (ref 0.350–4.500)

## 2018-07-16 LAB — BASIC METABOLIC PANEL
Anion gap: 8 (ref 5–15)
BUN: 25 mg/dL — AB (ref 8–23)
CHLORIDE: 104 mmol/L (ref 98–111)
CO2: 27 mmol/L (ref 22–32)
Calcium: 9.3 mg/dL (ref 8.9–10.3)
Creatinine, Ser: 1.69 mg/dL — ABNORMAL HIGH (ref 0.44–1.00)
GFR calc Af Amer: 36 mL/min — ABNORMAL LOW (ref >60)
GFR calc non Af Amer: 31 mL/min — ABNORMAL LOW (ref >60)
GLUCOSE: 84 mg/dL (ref 70–99)
POTASSIUM: 4.2 mmol/L (ref 3.5–5.1)
Sodium: 139 mmol/L (ref 135–145)

## 2018-07-16 NOTE — Patient Instructions (Signed)
Medication Instructions:  Your physician recommends that you continue on your current medications as directed. Please refer to the Current Medication list given to you today.  If you need a refill on your cardiac medications before your next appointment, please call your pharmacy.   Lab work: Your physician recommends that you return for lab work in: Today   If you have labs (blood work) drawn today and your tests are completely normal, you will receive your results only by: . MyChart Message (if you have MyChart) OR . A paper copy in the mail If you have any lab test that is abnormal or we need to change your treatment, we will call you to review the results.  Testing/Procedures: NONE   Follow-Up: At [facility], you and your health needs are our priority.  As part of our continuing mission to provide you with exceptional heart care, we have created designated Provider Care Teams.  These Care Teams include your primary Cardiologist (physician) and Advanced Practice Providers (APPs -  Physician Assistants and Nurse Practitioners) who all work together to provide you with the care you need, when you need it. You will need a follow up appointment in 8 months.  Please call our office 2 months in advance to schedule this appointment.  You may see Paula Ross, MD or one of the following Advanced Practice Providers on your designated Care Team:   Brittany Strader, PA-C ([place] Office) . Michele Lenze, PA-C ([place] Office)  Any Other Special Instructions Will Be Listed Below (If Applicable). Thank you for choosing [facility]!     

## 2018-07-16 NOTE — Progress Notes (Signed)
Cardiology Office Note   Date:  07/16/2018   ID:  [name], DOB 03/22/1951, MRN [id number]  PCP:  Gareth MorganKnowlton, Steve, MD  Cardiologist:   Dietrich PatesPaula Blaklee Shores, MD    Patient returns for f/u of PAF and HTN     History of Present Illness: Megan [name] is a 67 y.o. female who was first referred for SOB.  Followed by Megan [name]   I saw her in April 2019   She was admitted in May for L sided weakness and slurred speech    Work up neg   Continued on Eliquis     She was seen by Megan [name] in June [employer] improved some    She was admitted a few days later with hypotension.   Taken off of controlled substances  Left ama  Felt high risk for readmit due to use of benzo/opioids  CT cancelled due to  PACs   Set up fo rlexiscan myovue This was done in Aug and was Normal   No ischemia   LVEF normal    Since seen the patient says she still gets SOB with activity   She is not very active     Current Meds  Medication Sig  . acetaminophen (TYLENOL) 325 MG tablet Take 2 tablets (650 mg total) by mouth every 6 (six) hours as needed for mild pain, moderate pain or headache.  Marland Kitchen. apixaban (ELIQUIS) 5 MG TABS tablet Take 1 tablet (5 mg total) by mouth 2 (two) times daily. Restart on 7/14  . atorvastatin (LIPITOR) 80 MG tablet Take 80 mg by mouth daily.  . Cholecalciferol (VITAMIN D-3 PO) Take 2,000 Units by mouth daily.   . diazepam (VALIUM) 5 MG tablet Take 5 mg by mouth every 6 (six) hours as needed for anxiety.  . enalapril (VASOTEC) 20 MG tablet Take 20 mg by mouth daily.   Marland Kitchen. escitalopram (LEXAPRO) 10 MG tablet Take 1 tablet (10 mg total) by mouth daily.  . hydrochlorothiazide (HYDRODIURIL) 25 MG tablet Take 25 mg by mouth daily.  Marland Kitchen. levothyroxine (SYNTHROID, LEVOTHROID) 25 MCG tablet Take 25 mcg by mouth daily.  . metFORMIN (GLUCOPHAGE-XR) 500 MG 24 hr tablet Take 1,000 mg by mouth 2 (two) times daily.  . metoprolol succinate (TOPROL-XL) 50 MG 24 hr tablet Take 1 tablet (50 mg total) by mouth daily  with lunch.  . pantoprazole (PROTONIX) 40 MG tablet TAKE (1) TABLET BY MOUTH TWICE DAILY BEFORE A MEAL.  . pioglitazone (ACTOS) 15 MG tablet Take 15 mg by mouth daily.  . prednisoLONE 5 MG TABS tablet Take by mouth.  . predniSONE (DELTASONE) 5 MG tablet Take 5 mg by mouth daily.  . Prenatal Vit-Fe Fumarate-FA (PREPLUS) 27-1 MG TABS Take 1 tablet by mouth daily.     Allergies:   Naproxen   Past Medical History:  Diagnosis Date  . Agoraphobia with panic disorder   . Anxiety   . Arterial hypotension 12/20/2017  . Arthritis   . Chronic pain 03/08/15   Hydrocodone-APAP 10/325 mg, # 120 q month  . Colon polyps   . Complication of anesthesia   . Depression   . DM (diabetes mellitus) (HCC)   . GERD (gastroesophageal reflux disease)   . HTN (hypertension)   . Hypothyroidism   . Obesity   . PAF (paroxysmal atrial fibrillation) (HCC)   . PONV (postoperative nausea and vomiting)   . Stroke (HCC) 08/20/2014   left sided weakness; some memory loss.    Past Surgical  History:  Procedure Laterality Date  . ABDOMINAL HYSTERECTOMY  15 yrs ago   with bso at aph  . CARDIAC CATHETERIZATION  2004   normal  . CHOLECYSTECTOMY  25 yrs ago   [facility]  . COLONOSCOPY  05/04/11   left-sided colonic diverticulosis  . COLONOSCOPY N/A 01/27/2016   diverticulosis in sigmoid colon and descending colon, active bleeding Dieulafoy lesion s/p clips X 3, next colonoscopy in 5 years due to family history of colon cancer    . ESOPHAGOGASTRODUODENOSCOPY  05/04/2011   Abnormal distal esophagus, biopsies consistent with inflammation due to acid reflux. No evidence of Barrett's. No H. pylori  . ESOPHAGOGASTRODUODENOSCOPY N/A 01/22/2016   erosive gastropathy, non-bleeding gastric ulcers and 1 oozing gastric ulcer with adherent clot s/p heater probe. Negative H.pylori serology.   . ESOPHAGOGASTRODUODENOSCOPY (EGD) WITH PROPOFOL N/A 04/27/2016   Dr. Jena Gaussourk: healed ulcers. normal esophagus  . RADIOLOGY WITH ANESTHESIA N/A  08/20/2015   Procedure: RADIOLOGY WITH ANESTHESIA;  Surgeon: Julieanne CottonSanjeev Deveshwar, MD;  Location: [facility] OR;  Service: Radiology;  Laterality: N/A;     Social History:  The patient  reports that she quit smoking about 47 years ago. Her smoking use included cigarettes. She started smoking about 49 years ago. She has a 3.75 pack-year smoking history. She quit smokeless tobacco use about 3 years ago.  Her smokeless tobacco use included snuff. She reports that she does not drink alcohol or use drugs.   Family History:  [name] family history includes Colon cancer in her father, paternal aunt, paternal uncle, sister, and another family member.    ROS:  Please see the history of present illness. All other systems are reviewed and  Negative to the above problem except as noted.    PHYSICAL EXAM: VS:  BP 130/70   Pulse 61   Ht 5\' 2"  (1.575 m)   Wt 200 lb (90.7 kg)   SpO2 93%   BMI 36.58 kg/m   GEN: Morbidly obese 67 yo in no acute distress   Examined in chair HEENT: normal  Neck: JVP is normal  NO, carotid bruits, or masses Cardiac: RRR; no murmurs, rubs, or gallops  Tr LE edema  Respiratory:  clear to auscultation bilaterally, normal work of breathing GI: soft, nontender, nondistended, + BS  MS: no deformity Moving all extremities   Skin: warm and dry, no rash Neuro:  Deferred     EKG:  EKG is not ordered today   Lipid Panel    Component Value Date/Time   CHOL 232 (H) 10/26/2017 0953   TRIG 156 (H) 10/26/2017 0953   HDL 52 10/26/2017 0953   CHOLHDL 4.5 10/26/2017 0953   VLDL 17 08/21/2015 0435   LDLCALC 151 (H) 10/26/2017 0953      Wt Readings from Last 3 Encounters:  07/16/18 200 lb (90.7 kg)  06/03/18 190 lb (86.2 kg)  05/30/18 191 lb (86.6 kg)      ASSESSMENT AND PLAN:  1  Dypsnea.Continues to have   Myovue normal   Volume status looks OK    Encouraged her to increase her activity     2  HTN   Good control on current regimen  Will get labs today with current med  use    F/u in late summer   Current medicines are reviewed at length with the patient today.  The patient does not have concerns regarding medicines.  Signed, Dietrich PatesPaula Gurkirat Basher, MD  07/16/2018 9:14 AM    [facility] [phone]  [address] [place], [place]  [address] Phone: [phone]; Fax: [phone]

## 2018-07-18 DIAGNOSIS — S62002A Unspecified fracture of navicular [scaphoid] bone of left wrist, initial encounter for closed fracture: Secondary | ICD-10-CM | POA: Insufficient documentation

## 2018-07-19 ENCOUNTER — Ambulatory Visit: Payer: Medicare Other | Admitting: Orthopedic Surgery

## 2018-07-23 ENCOUNTER — Telehealth: Payer: Self-pay | Admitting: *Deleted

## 2018-07-23 NOTE — Telephone Encounter (Signed)
Called patient with test results. No answer. Left message to call back.  

## 2018-07-23 NOTE — Telephone Encounter (Signed)
-----   Message from Catherine A Carlton, RN sent at 07/22/2018  7:42 AM EST -----  ----- Message ----- From: Ross, Paula V, MD Sent: 07/22/2018  12:26 AM EST To: Catherine A Carlton, RN  Lipids are good   LDL 99 Thyroid funciton is normal   Electrolytes are OK   Kidney funciton is fair   Make sure stay hydrated CBC   Pt is anemic   Check fe, Ferritin TIBC, reticulocye count  

## 2018-07-24 ENCOUNTER — Telehealth: Payer: Self-pay | Admitting: *Deleted

## 2018-07-24 DIAGNOSIS — D509 Iron deficiency anemia, unspecified: Secondary | ICD-10-CM

## 2018-07-24 NOTE — Telephone Encounter (Signed)
Orders placed, patient notified.

## 2018-07-24 NOTE — Telephone Encounter (Signed)
-----   Message from Nori Riis, RN sent at 07/22/2018  7:42 AM EST -----  ----- Message ----- From: Pricilla Riffle, MD Sent: 07/22/2018  12:26 AM EST To: Nori Riis, RN  Lipids are good   LDL 99 Thyroid funciton is normal   Electrolytes are OK   Kidney funciton is fair   Make sure stay hydrated CBC   Pt is anemic   Check fe, Ferritin TIBC, reticulocye count

## 2018-07-26 ENCOUNTER — Other Ambulatory Visit (HOSPITAL_COMMUNITY)
Admission: RE | Admit: 2018-07-26 | Discharge: 2018-07-26 | Disposition: A | Discharge status: Home / Self Care | Payer: Medicare Other | Source: Ambulatory Visit | Attending: Internal Medicine | Admitting: Internal Medicine

## 2018-07-26 DIAGNOSIS — D509 Iron deficiency anemia, unspecified: Secondary | ICD-10-CM | POA: Diagnosis present

## 2018-07-26 LAB — IRON AND TIBC
Iron: 46 ug/dL (ref 28–170)
Saturation Ratios: 12 % (ref 10.4–31.8)
TIBC: 397 ug/dL (ref 250–450)
UIBC: 351 ug/dL

## 2018-07-26 LAB — RETICULOCYTES
IMMATURE RETIC FRACT: 5.3 % (ref 2.3–15.9)
RBC.: 3.43 MIL/uL — ABNORMAL LOW (ref 3.87–5.11)
RETIC CT PCT: 1.9 % (ref 0.4–3.1)
Retic Count, Absolute: 66.5 10*3/uL (ref 19.0–186.0)

## 2018-07-26 LAB — FERRITIN: FERRITIN: 20 ng/mL (ref 11–307)

## 2018-07-31 ENCOUNTER — Telehealth: Payer: Self-pay | Admitting: *Deleted

## 2018-07-31 ENCOUNTER — Encounter

## 2018-07-31 ENCOUNTER — Ambulatory Visit (INDEPENDENT_AMBULATORY_CARE_PROVIDER_SITE_OTHER): Payer: Medicare Other | Admitting: Orthopedic Surgery

## 2018-07-31 ENCOUNTER — Ambulatory Visit (INDEPENDENT_AMBULATORY_CARE_PROVIDER_SITE_OTHER): Payer: Medicare Other

## 2018-07-31 VITALS — BP 131/71 | HR 62 | Ht 62.0 in | Wt 200.0 lb

## 2018-07-31 DIAGNOSIS — S62025D Nondisplaced fracture of middle third of navicular [scaphoid] bone of left wrist, subsequent encounter for fracture with routine healing: Secondary | ICD-10-CM | POA: Diagnosis not present

## 2018-07-31 DIAGNOSIS — Z79899 Other long term (current) drug therapy: Secondary | ICD-10-CM

## 2018-07-31 NOTE — Telephone Encounter (Signed)
Returned call for results 

## 2018-07-31 NOTE — Addendum Note (Signed)
Addended by: Marlyn Corporal A on: 07/31/2018 01:45 PM   Modules accepted: Orders

## 2018-07-31 NOTE — Telephone Encounter (Signed)
-----   Message from Nori Riis, RN sent at 07/30/2018  7:38 AM EST -----  ----- Message ----- From: Pricilla Riffle, MD Sent: 07/29/2018  10:32 PM EST To: Nori Riis, RN  Anemia not clear cut Can try FeSO4 325 daily Forward labs to [facility] for long term f/u Will need CBC in 2 months

## 2018-07-31 NOTE — Progress Notes (Signed)
Chief Complaint  Patient presents with  . Follow-up    Recheck on left wrist fracture, DOI 05-13-18.    Scaphoid fracture of the left wrist treated with a cast 12 weeks out now post injury day #82 other than some mild pain doing well x-ray shows that the fracture is in good position looks like it healed okay.  Recommend removable splint for 3 weeks follow-up as needed  Encounter Diagnosis  Name Primary?  . Closed nondisplaced fracture of middle third of scaphoid of left wrist with routine healing, subsequent encounter 05/13/18 Yes

## 2018-07-31 NOTE — Telephone Encounter (Signed)
Called patient with test results. No answer. Left message to call back.  

## 2018-07-31 NOTE — Telephone Encounter (Signed)
Spoke with pt, gave results of labs.Pt is already on FeSo4 in prenatal vitamins.Will mail [facility] lab slip, copied pcp

## 2018-07-31 NOTE — Patient Instructions (Signed)
Wear support brace for 3 weeks you do not have to sleep in it and obviously do not bathe and

## 2019-04-16 ENCOUNTER — Encounter: Payer: Self-pay | Admitting: Internal Medicine

## 2019-05-02 ENCOUNTER — Ambulatory Visit (INDEPENDENT_AMBULATORY_CARE_PROVIDER_SITE_OTHER): Payer: Medicare Other | Admitting: Internal Medicine

## 2019-05-02 ENCOUNTER — Encounter: Payer: Self-pay | Admitting: Internal Medicine

## 2019-05-02 ENCOUNTER — Other Ambulatory Visit: Payer: Self-pay

## 2019-05-02 VITALS — BP 132/75 | HR 64 | Temp 97.0°F | Ht 62.0 in | Wt 181.8 lb

## 2019-05-02 DIAGNOSIS — K429 Umbilical hernia without obstruction or gangrene: Secondary | ICD-10-CM

## 2019-05-02 DIAGNOSIS — K219 Gastro-esophageal reflux disease without esophagitis: Secondary | ICD-10-CM | POA: Diagnosis not present

## 2019-05-02 NOTE — Progress Notes (Signed)
Primary Care Physician:  Gareth MorganKnowlton, Steve, MD Primary Gastroenterologist:  Dr. Jena Gaussourk  Pre-Procedure History & Physical: HPI:  Megan LeekVirginia A [name] is a 68 y.o. 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  Doing very well on Protonix 40 mg daily.  She is devoid of any GI symptoms at this time aside from her umbilical hernia-repaired delayed because of the pandemic   She denies reflux, abdominal pain, nausea, vomiting, melena, constipation, hematochezia or diarrhea. Marked positive family history of colon cancer; due for surveillance colonoscopy 2022.  She continues to be anticoagulated.  Past Medical History:  Diagnosis Date   Agoraphobia with panic disorder    Anxiety    Arterial hypotension 12/20/2017   Arthritis    Chronic pain 03/08/15   Hydrocodone-APAP 10/325 mg, # 120 q month   Colon polyps    Complication of anesthesia    Depression    DM (diabetes mellitus) (HCC)    GERD (gastroesophageal reflux disease)    HTN (hypertension)    Hypothyroidism    Obesity    PAF (paroxysmal atrial fibrillation) (HCC)    PONV (postoperative nausea and vomiting)    Stroke (HCC) 08/20/2014   left sided weakness; some memory loss.    Past Surgical History:  Procedure Laterality Date   ABDOMINAL HYSTERECTOMY  15 yrs ago   with bso at aph   CARDIAC CATHETERIZATION  2004   normal   CHOLECYSTECTOMY  25 yrs ago   [facility]   COLONOSCOPY  05/04/11   left-sided colonic diverticulosis   COLONOSCOPY N/A 01/27/2016   diverticulosis in sigmoid colon and descending colon, active bleeding Dieulafoy lesion s/p clips X 3, next colonoscopy in 5 years due to family history of colon cancer     ESOPHAGOGASTRODUODENOSCOPY  05/04/2011   Abnormal distal esophagus, biopsies consistent with inflammation due to acid reflux. No evidence of Barrett's. No H. pylori   ESOPHAGOGASTRODUODENOSCOPY N/A 01/22/2016   erosive  gastropathy, non-bleeding gastric ulcers and 1 oozing gastric ulcer with adherent clot s/p heater probe. Negative H.pylori serology.    ESOPHAGOGASTRODUODENOSCOPY (EGD) WITH PROPOFOL N/A 04/27/2016   Dr. Jena Gaussourk: healed ulcers. normal esophagus   RADIOLOGY WITH ANESTHESIA N/A 08/20/2015   Procedure: RADIOLOGY WITH ANESTHESIA;  Surgeon: Julieanne CottonSanjeev Deveshwar, MD;  Location: [facility] OR;  Service: Radiology;  Laterality: N/A;    Prior to Admission medications   Medication Sig Start Date End Date Taking? Authorizing Provider  acetaminophen (TYLENOL) 325 MG tablet Take 2 tablets (650 mg total) by mouth every 6 (six) hours as needed for mild pain, moderate pain or headache. 11/19/17  Yes Johnson, Clanford L, MD  apixaban (ELIQUIS) 5 MG TABS tablet Take 1 tablet (5 mg total) by mouth 2 (two) times daily. Restart on 7/14 01/27/16  Yes Erick BlinksMemon, Jehanzeb, MD  atorvastatin (LIPITOR) 80 MG tablet Take 80 mg by mouth daily.   Yes [provider]  Cholecalciferol (VITAMIN D-3 PO) Take 2,000 Units by mouth daily.    Yes [provider]  enalapril (VASOTEC) 20 MG tablet Take 20 mg by mouth daily.    Yes [provider]  escitalopram (LEXAPRO) 10 MG tablet Take 1 tablet (10 mg total) by mouth daily. 11/19/17  Yes Johnson, Clanford L, MD  hydrochlorothiazide (HYDRODIURIL) 25 MG tablet Take 25 mg by mouth daily.   Yes [provider]  levothyroxine (SYNTHROID, LEVOTHROID) 25 MCG tablet Take 25 mcg by mouth daily. 07/14/15  Yes [provider]  metFORMIN (GLUCOPHAGE-XR) 500 MG 24 hr tablet Take 1,000 mg by mouth 2 (two) times daily.   Yes [provider]  metoprolol succinate (TOPROL-XL) 50 MG 24 hr tablet Take 1 tablet (50 mg total) by mouth daily with lunch. 12/22/17  Yes Johnson, Clanford L, MD  pantoprazole (PROTONIX) 40 MG tablet TAKE (1) TABLET BY MOUTH TWICE DAILY BEFORE A MEAL. 10/05/17  Yes Wynne Dust A, NP  pioglitazone (ACTOS) 15 MG tablet Take 15 mg by mouth daily.   Yes  [provider]  predniSONE (DELTASONE) 5 MG tablet Take 5 mg by mouth daily.   Yes [provider]    Allergies as of 05/02/2019 - Review Complete 05/02/2019  Allergen Reaction Noted   Naproxen Other (See Comments) 04/05/2011    Family History  Problem Relation Age of Onset   Colon cancer Paternal Aunt        2, colon cancer   Colon cancer Paternal Uncle        6, colon cancer   Colon cancer Father        deceased, age 27   Colon cancer Other        numerous cousins died before age 70 with colon cancer   Colon cancer Sister        deceased, age 52   Anesthesia problems Neg Hx    Hypotension Neg Hx    Malignant hyperthermia Neg Hx    Pseudochol deficiency Neg Hx     Social History   Socioeconomic History   Marital status: Married    Spouse name: Not on file   Number of children: 3   Years of education: Not on file   Highest education level: Not on file  Occupational History   Occupation: disability    Employer: NOT EMPLOYED  Social Network engineer strain: Not on file   Food insecurity    Worry: Not on file    Inability: Not on file   Transportation needs    Medical: Not on file    Non-medical: Not on file  Tobacco Use   Smoking status: Former Smoker    Packs/day: 0.25    Years: 15.00    Pack years: 3.75    Types: Cigarettes    Start date: 07/17/1969    Quit date: 07/18/1971    Years since quitting: 47.8   Smokeless tobacco: Former Neurosurgeon    Types: Snuff    Quit date: 08/20/2014   Tobacco comment: never more than 1-2 cig/day in entire life  Substance and Sexual Activity   Alcohol use: No    Alcohol/week: 0.0 standard drinks   Drug use: No   Sexual activity: Yes    Birth control/protection: Surgical  Lifestyle   Physical activity    Days per week: Not on file    Minutes per session: Not on file   Stress: Not on file  Relationships   Social connections    Talks on phone: Not on file    Gets together:  Not on file    Attends religious service: Not on file    Active member of club or organization: Not on file    Attends meetings of clubs or organizations: Not on file    Relationship status: Not on file   Intimate partner violence    Fear of current or ex partner: Not on file    Emotionally abused: Not on file    Physically abused: Not on file  Forced sexual activity: Not on file  Other Topics Concern   Not on file  Social History Narrative   No contact with children since they left home at age 28. Doesn't know where they are.    Review of Systems: See HPI, otherwise negative ROS  Physical Exam: BP 132/75    Pulse 64    Temp (!) 97 F (36.1 C) (Oral)    Ht 5\' 2"  (1.575 m)    Wt 181 lb 12.8 oz (82.5 kg)    BMI 33.25 kg/m  General:   Alert,  Well-developed, well-nourished, pleasant and cooperative in NAD Heart:  Regular rate and rhythm; no murmurs, clicks, rubs,  or gallops. Abdomen: Non-distended, normal bowel sounds.  Soft and nontender without appreciable mass or hepatosplenomegaly.  Easily reducible umbilical hernia present. Pulses:  Normal pulses noted. Extremities:  Without clubbing or edema.  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  Doing well.  No NSAIDs.  Continues on anticoagulation.  Reflux symptoms well controlled on Protonix 40 mg daily. Because of significant family history of colon cancer;  surveillance colonoscopy is recommended for 2022.   Recommendations:  Continue Protonix 40 mg daily - indefinitely as discussed  Repeat Colonoscopy 2022  I will reach out to Dr. Arnoldo Morale regarding re-assessment of umbilical hernia  OV here in 1 year   Notice: This dictation was prepared with Dragon dictation along with smaller phrase technology. Any transcriptional errors that result from this process are unintentional and may not be corrected upon review.

## 2019-05-02 NOTE — Patient Instructions (Signed)
Continue Protonix 40 mg daily - indefinitely as discussed  Repeat Colonoscopy 2022  I will reach out to Dr. Arnoldo Morale regarding re-assessment of umbilical hernia  OV here in 1 year

## 2019-05-20 ENCOUNTER — Ambulatory Visit: Payer: Medicare Other | Admitting: General Surgery

## 2019-05-21 NOTE — Progress Notes (Deleted)
Cardiology Office Note   Date:  05/21/2019   ID:  [name], DOB 07-Jul-1951, MRN [id number]  PCP:  [name], MD  Cardiologist:   Dietrich Pates, MD    Patient returns for f/u of PAF and HTN     History of Present Illness: Megan [name] is a 68 y.o. female who was first referred for SOB.  Followed by Michelle Nasuti   I saw her in April 2019   She was admitted in May for L sided weakness and slurred speech    Work up neg   Continued on Eliquis     She was seen by b [place] Ouch in [employer] improved some    She was admitted a few days later with hypotension.   Taken off of controlled substances  Left ama  Felt high risk for readmit due to use of benzo/opioids  CT cancelled due to  PACs   Set up fo rlexiscan myovue This was done in Aug and was Normal   No ischemia   LVEF normal    Since seen the patient says she still gets SOB with activity   She is not very active     No outpatient medications have been marked as taking for the 05/22/19 encounter (Appointment) with Pricilla Riffle, MD.     Allergies:   Naproxen   Past Medical History:  Diagnosis Date  . Agoraphobia with panic disorder   . Anxiety   . Arterial hypotension 12/20/2017  . Arthritis   . Chronic pain 03/08/15   Hydrocodone-APAP 10/325 mg, # 120 q month  . Colon polyps   . Complication of anesthesia   . Depression   . DM (diabetes mellitus) (HCC)   . GERD (gastroesophageal reflux disease)   . HTN (hypertension)   . Hypothyroidism   . Obesity   . PAF (paroxysmal atrial fibrillation) (HCC)   . PONV (postoperative nausea and vomiting)   . Stroke (HCC) 08/20/2014   left sided weakness; some memory loss.    Past Surgical History:  Procedure Laterality Date  . ABDOMINAL HYSTERECTOMY  15 yrs ago   with bso at aph  . CARDIAC CATHETERIZATION  2004   normal  . CHOLECYSTECTOMY  25 yrs ago   [facility]  . COLONOSCOPY  05/04/11   left-sided colonic diverticulosis  . COLONOSCOPY N/A 01/27/2016   diverticulosis  in sigmoid colon and descending colon, active bleeding Dieulafoy lesion s/p clips X 3, next colonoscopy in 5 years due to family history of colon cancer    . ESOPHAGOGASTRODUODENOSCOPY  05/04/2011   Abnormal distal esophagus, biopsies consistent with inflammation due to acid reflux. No evidence of Barrett's. No H. pylori  . ESOPHAGOGASTRODUODENOSCOPY N/A 01/22/2016   erosive gastropathy, non-bleeding gastric ulcers and 1 oozing gastric ulcer with adherent clot s/p heater probe. Negative H.pylori serology.   . ESOPHAGOGASTRODUODENOSCOPY (EGD) WITH PROPOFOL N/A 04/27/2016   Dr. Jena Gauss: healed ulcers. normal esophagus  . RADIOLOGY WITH ANESTHESIA N/A 08/20/2015   Procedure: RADIOLOGY WITH ANESTHESIA;  Surgeon: Julieanne Cotton, MD;  Location: [facility];  Service: Radiology;  Laterality: N/A;     Social History:  The patient  reports that she quit smoking about 47 years ago. Her smoking use included cigarettes. She started smoking about 49 years ago. She has a 3.75 pack-year smoking history. She quit smokeless tobacco use about 4 years ago.  Her smokeless tobacco use included snuff. She reports that she does not drink alcohol or use drugs.   Family  History:  [name] family history includes Colon cancer in her father, paternal aunt, paternal uncle, sister, and another family member.    ROS:  Please see the history of present illness. All other systems are reviewed and  Negative to the above problem except as noted.    PHYSICAL EXAM: VS:  There were no vitals taken for this visit.  GEN: Morbidly obese 68 yo in no acute distress   Examined in chair HEENT: normal  Neck: JVP is normal  NO, carotid bruits, or masses Cardiac: RRR; no murmurs, rubs, or gallops  Tr LE edema  Respiratory:  clear to auscultation bilaterally, normal work of breathing GI: soft, nontender, nondistended, + BS  MS: no deformity Moving all extremities   Skin: warm and dry, no rash Neuro:  Deferred     EKG:  EKG is not  ordered today   Lipid Panel    Component Value Date/Time   CHOL 175 07/16/2018 1002   TRIG 119 07/16/2018 1002   HDL 52 07/16/2018 1002   CHOLHDL 3.4 07/16/2018 1002   VLDL 24 07/16/2018 1002   LDLCALC 99 07/16/2018 1002   LDLCALC 151 (H) 10/26/2017 0953      Wt Readings from Last 3 Encounters:  05/02/19 181 lb 12.8 oz (82.5 kg)  07/31/18 200 lb (90.7 kg)  07/16/18 200 lb (90.7 kg)      ASSESSMENT AND PLAN:  1  Dypsnea.Continues to have   Myovue normal   Volume status looks OK    Encouraged her to increase her activity     2  HTN   Good control on current regimen  Will get labs today with current med use    F/u in late summer   Current medicines are reviewed at length with the patient today.  The patient does not have concerns regarding medicines.  Signed, Dorris Carnes, MD  05/21/2019 4:02 PM    [facility] [place], [place], [place]  [address] Phone: [phone]; Fax: [phone]

## 2019-05-22 ENCOUNTER — Ambulatory Visit: Payer: Medicare Other | Admitting: Internal Medicine

## 2019-06-03 ENCOUNTER — Other Ambulatory Visit: Payer: Self-pay

## 2019-06-03 ENCOUNTER — Ambulatory Visit (INDEPENDENT_AMBULATORY_CARE_PROVIDER_SITE_OTHER): Payer: Medicare Other | Admitting: General Surgery

## 2019-06-03 ENCOUNTER — Encounter: Payer: Self-pay | Admitting: General Surgery

## 2019-06-03 VITALS — BP 143/79 | HR 71 | Temp 98.4°F | Resp 16 | Ht 62.0 in | Wt 181.0 lb

## 2019-06-03 DIAGNOSIS — K432 Incisional hernia without obstruction or gangrene: Secondary | ICD-10-CM

## 2019-06-03 NOTE — Patient Instructions (Signed)
Ventral Hernia  A ventral hernia is a bulge of tissue from inside the abdomen that pushes through a weak area of the muscles that form the front wall of the abdomen. The tissues inside the abdomen are inside a sac (peritoneum). These tissues include the small intestine, large intestine, and the fatty tissue that covers the intestines (omentum). Sometimes, the bulge that forms a hernia contains intestines. Other hernias contain only fat. Ventral hernias do not go away without surgical treatment. There are several types of ventral hernias. You may have:  A hernia at an incision site from previous abdominal surgery (incisional hernia).  A hernia just above the belly button (epigastric hernia), or at the belly button (umbilical hernia). These types of hernias can develop from heavy lifting or straining.  A hernia that comes and goes (reducible hernia). It may be visible only when you lift or strain. This type of hernia can be pushed back into the abdomen (reduced).  A hernia that traps abdominal tissue inside the hernia (incarcerated hernia). This type of hernia does not reduce.  A hernia that cuts off blood flow to the tissues inside the hernia (strangulated hernia). The tissues can start to die if this happens. This is a very painful bulge that cannot be reduced. A strangulated hernia is a medical emergency. What are the causes? This condition is caused by abdominal tissue putting pressure on an area of weakness in the abdominal muscles. What increases the risk? The following factors may make you more likely to develop this condition:  Being female.  Being 60 or older.  Being overweight or obese.  Having had previous abdominal surgery, especially if there was an infection after surgery.  Having had an injury to the abdominal wall.  Having had several pregnancies.  Having a buildup of fluid inside the abdomen (ascites). What are the signs or symptoms? The only symptom of a ventral hernia  may be a painless bulge in the abdomen. A reducible hernia may be visible only when you strain, cough, or lift. Other symptoms may include:  Dull pain.  A feeling of pressure. Signs and symptoms of a strangulated hernia may include:  Increasing pain.  Nausea and vomiting.  Pain when pressing on the hernia.  The skin over the hernia turning red or purple.  Constipation.  Blood in the stool (feces). How is this diagnosed? This condition may be diagnosed based on:  Your symptoms.  Your medical history.  A physical exam. You may be asked to cough or strain while standing. These actions increase the pressure inside your abdomen and force the hernia through the opening in your muscles. Your health care provider may try to reduce the hernia by pressing on it.  Imaging studies, such as an ultrasound or CT scan. How is this treated? This condition is treated with surgery. If you have a strangulated hernia, surgery is done as soon as possible. If your hernia is small and not incarcerated, you may be asked to lose some weight before surgery. Follow these instructions at home:  Follow instructions from your health care provider about eating or drinking restrictions.  If you are overweight, your health care provider may recommend that you increase your activity level and eat a healthier diet.  Do not lift anything that is heavier than 10 lb (4.5 kg).  Return to your normal activities as told by your health care provider. Ask your health care provider what activities are safe for you. You may need to avoid activities   that increase pressure on your hernia.  Take over-the-counter and prescription medicines only as told by your health care provider.  Keep all follow-up visits as told by your health care provider. This is important. Contact a health care provider if:  Your hernia gets larger.  Your hernia becomes painful. Get help right away if:  Your hernia becomes increasingly  painful.  You have pain along with any of the following: ? Changes in skin color in the area of the hernia. ? Nausea. ? Vomiting. ? Fever. Summary  A ventral hernia is a bulge of tissue from inside the abdomen that pushes through a weak area of the muscles that form the front wall of the abdomen.  This condition is treated with surgery, which may be urgent depending on your hernia.  Do not lift anything that is heavier than 10 lb (4.5 kg), and follow activity instructions from your health care provider. This information is not intended to replace advice given to you by your health care provider. Make sure you discuss any questions you have with your health care provider. Document Released: 06/19/2012 Document Revised: 08/15/2017 Document Reviewed: 01/22/2017 Elsevier Patient Education  2020 Elsevier Inc.  

## 2019-06-03 NOTE — Progress Notes (Signed)
Megan [name]; [id number]; [birth year]   HPI Patient is a 68 year old white female who was referred back to my care by Dr. Gala Romney for evaluation of her incisional hernia.  Patient states the hernia may have decreased in size, but is not bothering her.  She does state intermittent nausea and nonspecific abdominal discomfort, but no emesis.  It is made worse with straining.  It is difficult to tell whether her symptoms are associated with the hernia opening is sticking out.  It is made worse with straining.  She currently has 0 out of 10 abdominal pain.  She is on Eliquis due to a history of CVA.  Her gait is somewhat stable because of this. Past Medical History:  Diagnosis Date  . Agoraphobia with panic disorder   . Anxiety   . Arterial hypotension 12/20/2017  . Arthritis   . Chronic pain 03/08/15   Hydrocodone-APAP 10/325 mg, # 120 q month  . Colon polyps   . Complication of anesthesia   . Depression   . DM (diabetes mellitus) ([place])   . GERD (gastroesophageal reflux disease)   . HTN (hypertension)   . Hypothyroidism   . Obesity   . PAF (paroxysmal atrial fibrillation) ([place])   . PONV (postoperative nausea and vomiting)   . Stroke ([place]) 08/20/2014   left sided weakness; some memory loss.    Past Surgical History:  Procedure Laterality Date  . ABDOMINAL HYSTERECTOMY  15 yrs ago   with bso at aph  . CARDIAC CATHETERIZATION  2004   normal  . CHOLECYSTECTOMY  25 yrs ago   [facility]  . COLONOSCOPY  05/04/11   left-sided colonic diverticulosis  . COLONOSCOPY N/A 01/27/2016   diverticulosis in sigmoid colon and descending colon, active bleeding Dieulafoy lesion s/p clips X 3, next colonoscopy in 5 years due to family history of colon cancer    . ESOPHAGOGASTRODUODENOSCOPY  05/04/2011   Abnormal distal esophagus, biopsies consistent with inflammation due to acid reflux. No evidence of Barrett's. No H. pylori  . ESOPHAGOGASTRODUODENOSCOPY N/A 01/22/2016   erosive gastropathy, non-bleeding  gastric ulcers and 1 oozing gastric ulcer with adherent clot s/p heater probe. Negative H.pylori serology.   . ESOPHAGOGASTRODUODENOSCOPY (EGD) WITH PROPOFOL N/A 04/27/2016   Dr. Gala Romney: healed ulcers. normal esophagus  . RADIOLOGY WITH ANESTHESIA N/A 08/20/2015   Procedure: RADIOLOGY WITH ANESTHESIA;  Surgeon: Luanne Bras, MD;  Location: [facility];  Service: Radiology;  Laterality: N/A;    Family History  Problem Relation Age of Onset  . Colon cancer Paternal Aunt        2, colon cancer  . Colon cancer Paternal Uncle        6, colon cancer  . Colon cancer Father        deceased, age 59  . Colon cancer Other        numerous cousins died before age 78 with colon cancer  . Colon cancer Sister        deceased, age 59  . Anesthesia problems Neg Hx   . Hypotension Neg Hx   . Malignant hyperthermia Neg Hx   . Pseudochol deficiency Neg Hx     Current Outpatient Medications on File Prior to Visit  Medication Sig Dispense Refill  . acetaminophen (TYLENOL) 325 MG tablet Take 2 tablets (650 mg total) by mouth every 6 (six) hours as needed for mild pain, moderate pain or headache.    Marland Kitchen apixaban (ELIQUIS) 5 MG TABS tablet Take 1 tablet (5 mg total) by mouth  2 (two) times daily. Restart on 7/14 60 tablet 2  . atorvastatin (LIPITOR) 80 MG tablet Take 80 mg by mouth daily.    . Cholecalciferol (VITAMIN D-3 PO) Take 2,000 Units by mouth daily.     . enalapril (VASOTEC) 20 MG tablet Take 20 mg by mouth daily.     Marland Kitchen escitalopram (LEXAPRO) 10 MG tablet Take 1 tablet (10 mg total) by mouth daily.    . hydrochlorothiazide (HYDRODIURIL) 25 MG tablet Take 25 mg by mouth daily.    Marland Kitchen levothyroxine (SYNTHROID, LEVOTHROID) 25 MCG tablet Take 25 mcg by mouth daily.    . metFORMIN (GLUCOPHAGE-XR) 500 MG 24 hr tablet Take 1,000 mg by mouth 2 (two) times daily.    . metoprolol succinate (TOPROL-XL) 50 MG 24 hr tablet Take 1 tablet (50 mg total) by mouth daily with lunch.    . pantoprazole (PROTONIX) 40 MG tablet  TAKE (1) TABLET BY MOUTH TWICE DAILY BEFORE A MEAL. 180 tablet 1  . pioglitazone (ACTOS) 15 MG tablet Take 15 mg by mouth daily.    . predniSONE (DELTASONE) 5 MG tablet Take 5 mg by mouth daily.     No current facility-administered medications on file prior to visit.     Allergies  Allergen Reactions  . Naproxen Other (See Comments)    Causes stomach burning    Social History   Substance and Sexual Activity  Alcohol Use No  . Alcohol/week: 0.0 standard drinks    Social History   Tobacco Use  Smoking Status Former Smoker  . Packs/day: 0.25  . Years: 15.00  . Pack years: 3.75  . Types: Cigarettes  . Start date: 07/17/1969  . Quit date: 07/18/1971  . Years since quitting: 47.9  Smokeless Tobacco Former Neurosurgeon  . Types: Snuff  . Quit date: 08/20/2014  Tobacco Comment   never more than 1-2 cig/day in entire life    Review of Systems  Constitutional: Negative.   HENT: Negative.   Eyes: Positive for blurred vision, double vision and pain.  Respiratory: Negative.   Cardiovascular: Negative.   Gastrointestinal: Positive for abdominal pain, heartburn and nausea.  Genitourinary: Negative.   Musculoskeletal: Positive for back pain, joint pain and neck pain.  Skin: Negative.   Neurological: Negative.   Endo/Heme/Allergies: Bruises/bleeds easily.  Psychiatric/Behavioral: Negative.     Objective   Vitals:   06/03/19 1407  BP: (!) 143/79  Pulse: 71  Resp: 16  Temp: 98.4 F (36.9 C)  SpO2: 96%    Physical Exam Vitals signs reviewed.  Constitutional:      Appearance: Normal appearance. She is obese. She is not ill-appearing.  HENT:     Head: Normocephalic and atraumatic.  Cardiovascular:     Rate and Rhythm: Normal rate and regular rhythm.     Heart sounds: Normal heart sounds. No murmur. No friction rub. No gallop.   Pulmonary:     Effort: Pulmonary effort is normal. No respiratory distress.     Breath sounds: Normal breath sounds. No stridor. No wheezing, rhonchi  or rales.  Abdominal:     General: There is no distension.     Palpations: Abdomen is soft. There is no mass.     Tenderness: There is no abdominal tenderness. There is no guarding or rebound.     Hernia: A hernia is present.  Skin:    General: Skin is warm and dry.  Neurological:     Mental Status: She is alert and oriented to person, place, and  time.   Previous office notes reviewed  Assessment  Incisional hernia, chronic anticoagulation due to CVA, nonspecific abdominal discomfort and nausea Plan   Is difficult to certain how much the hernia is affecting her abdominal complaints.  She is very resistant to having any surgical intervention unless absolutely necessary.  She is at higher surgical risk due to her comorbidities.  The risk of incarceration is low given the size of the hernia base.  Literature was given.  I told her that should her symptoms worsen, she should return to my office.  She understands this and agrees.  Follow-up as needed.

## 2019-08-24 NOTE — Progress Notes (Signed)
Cardiology Office Note   Date:  08/25/2019   ID:  [name], Megan [birth year], MRN [id number]  PCP:  Lemmie Evens, MD  Cardiologist:   Dorris Carnes, MD    Patient returns for f/u of HTN    History of Present Illness: Megan [name] is a 69 y.o. female who was first referred for SOB.  Followed by Megan [name]   I saw her in April 2019   She was admitted in May for L sided weakness and slurred speech    Work up neg   Continued on Eliquis     She was seen by b Ahmed Prima in [employer] improved some    She was admitted a few days later with hypotension.   Taken off of controlled substances   Set up fo Lexiscan myovue this was done in Aug 2019    Normal   No ischemia   LVEF normal     I saw the pt in Jan 2020   Since she was seen in clinic she denies CP   Breathing is fair  She denies LE edema  No palpitations   She is being evaluated for ventral hernia repair       Current Meds  Medication Sig  . acetaminophen (TYLENOL) 325 MG tablet Take 2 tablets (650 mg total) by mouth every 6 (six) hours as needed for mild pain, moderate pain or headache.  Marland Kitchen apixaban (ELIQUIS) 5 MG TABS tablet Take 1 tablet (5 mg total) by mouth 2 (two) times daily. Restart on 7/14  . atorvastatin (LIPITOR) 80 MG tablet Take 80 mg by mouth daily.  . Cholecalciferol (VITAMIN D-3 PO) Take 2,000 Units by mouth daily.   . enalapril (VASOTEC) 20 MG tablet Take 20 mg by mouth daily.   Marland Kitchen escitalopram (LEXAPRO) 10 MG tablet Take 1 tablet (10 mg total) by mouth daily.  . hydrochlorothiazide (HYDRODIURIL) 25 MG tablet Take 25 mg by mouth daily.  Marland Kitchen levothyroxine (SYNTHROID, LEVOTHROID) 25 MCG tablet Take 25 mcg by mouth daily.  . metFORMIN (GLUCOPHAGE-XR) 500 MG 24 hr tablet Take 1,000 mg by mouth 2 (two) times daily.  . metoprolol succinate (TOPROL-XL) 50 MG 24 hr tablet Take 1 tablet (50 mg total) by mouth daily with lunch.  . pantoprazole (PROTONIX) 40 MG tablet Take 40 mg by mouth daily.  . pioglitazone  (ACTOS) 15 MG tablet Take 15 mg by mouth daily.  . predniSONE (DELTASONE) 5 MG tablet Take 5 mg by mouth daily.     Allergies:   Naproxen   Past Medical History:  Diagnosis Date  . Agoraphobia with panic disorder   . Anxiety   . Arterial hypotension 12/20/2017  . Arthritis   . Chronic pain 03/08/15   Hydrocodone-APAP 10/325 mg, # 120 q month  . Colon polyps   . Complication of anesthesia   . Depression   . DM (diabetes mellitus) ([place])   . GERD (gastroesophageal reflux disease)   . HTN (hypertension)   . Hypothyroidism   . Obesity   . PAF (paroxysmal atrial fibrillation) ([place])   . PONV (postoperative nausea and vomiting)   . Stroke ([place]) 08/20/2014   left sided weakness; some memory loss.    Past Surgical History:  Procedure Laterality Date  . ABDOMINAL HYSTERECTOMY  15 yrs ago   with bso at aph  . CARDIAC CATHETERIZATION  2004   normal  . CHOLECYSTECTOMY  25 yrs ago   [facility]  . COLONOSCOPY  05/04/11  left-sided colonic diverticulosis  . COLONOSCOPY N/A 01/27/2016   diverticulosis in sigmoid colon and descending colon, active bleeding Dieulafoy lesion s/p clips X 3, next colonoscopy in 5 years due to family history of colon cancer    . ESOPHAGOGASTRODUODENOSCOPY  05/04/2011   Abnormal distal esophagus, biopsies consistent with inflammation due to acid reflux. No evidence of Barrett's. No H. pylori  . ESOPHAGOGASTRODUODENOSCOPY N/A 01/22/2016   erosive gastropathy, non-bleeding gastric ulcers and 1 oozing gastric ulcer with adherent clot s/p heater probe. Negative H.pylori serology.   . ESOPHAGOGASTRODUODENOSCOPY (EGD) WITH PROPOFOL N/A 04/27/2016   Dr. Jena Gauss: healed ulcers. normal esophagus  . RADIOLOGY WITH ANESTHESIA N/A 08/20/2015   Procedure: RADIOLOGY WITH ANESTHESIA;  Surgeon: Julieanne Cotton, MD;  Location: [facility];  Service: Radiology;  Laterality: N/A;     Social History:  The patient  reports that she quit smoking about 48 years ago. Her smoking use included  cigarettes. She started smoking about 50 years ago. She has a 3.75 pack-year smoking history. She quit smokeless tobacco use about 5 years ago.  Her smokeless tobacco use included snuff. She reports that she does not drink alcohol or use drugs.   Family History:  [name] family history includes Colon cancer in her father, paternal aunt, paternal uncle, sister, and another family member.    ROS:  Please see the history of present illness. All other systems are reviewed and  Negative to the above problem except as noted.    PHYSICAL EXAM: VS:  BP (!) 155/82   Pulse 64   Temp 97.6 F (36.4 C) (Temporal)   Ht 5\' 2"  (1.575 m)   Wt 201 lb (91.2 kg)   HC 62" (157.5 cm)   SpO2 100%   BMI 36.76 kg/m   GEN: Morbidly obese 69 yo in no acute distress  Pt examined in chair HEENT: normal  Neck: JVP is normal  NO, carotid bruits Cardiac: RRR; no murmurs, rubs, or gallops  Tr LE edema  Respiratory:  clear to auscultation bilaterally, normal work of breathing GI: soft, nontender, nondistended, + BS  MS: no deformity Moving all extremities   Skin: warm and dry, no rash Neuro:  Deferred     EKG:  EKG is not ordered today   Lipid Panel    Component Value Date/Time   CHOL 175 07/16/2018 1002   TRIG 119 07/16/2018 1002   HDL 52 07/16/2018 1002   CHOLHDL 3.4 07/16/2018 1002   VLDL 24 07/16/2018 1002   LDLCALC 99 07/16/2018 1002   LDLCALC 151 (H) 10/26/2017 0953      Wt Readings from Last 3 Encounters:  08/25/19 201 lb (91.2 kg)  06/03/19 181 lb (82.1 kg)  05/02/19 181 lb 12.8 oz (82.5 kg)      ASSESSMENT AND PLAN:  1  Dypsnea.Pt denies signif dyspnea  Volume status is OK      2  HTN   Blood pressure is better at home she says    Continue current meds   Encouraged her to check at home   Keep records of this and bring to visits  3  PAF   Will review records   Currently appear to be in SR    Remains on Eliquis for stroke prevention    4 Preop eval:    Pt being seen by Dr  05/04/19 for ventral hernia repair  From a cardiac standpoint I think she is at rel low risk for major cardiac event and OK to proceed  Plan for f/u in the fall, sooner for problems    Will get labs today with current med use    F/u in late summer   Current medicines are reviewed at length with the patient today.  The patient does not have concerns regarding medicines.  Signed, Dietrich Pates, MD  08/25/2019 2:23 PM    [facility] [address] [place], [place], [place]  [address] Phone: [phone]; Fax: [phone]

## 2019-08-25 ENCOUNTER — Ambulatory Visit (INDEPENDENT_AMBULATORY_CARE_PROVIDER_SITE_OTHER): Payer: Medicare Other | Admitting: Internal Medicine

## 2019-08-25 ENCOUNTER — Encounter: Payer: Self-pay | Admitting: Internal Medicine

## 2019-08-25 ENCOUNTER — Other Ambulatory Visit: Payer: Self-pay

## 2019-08-25 VITALS — BP 155/82 | HR 64 | Temp 97.6°F | Ht 62.0 in | Wt 201.0 lb

## 2019-08-25 DIAGNOSIS — I63039 Cerebral infarction due to thrombosis of unspecified carotid artery: Secondary | ICD-10-CM | POA: Diagnosis not present

## 2019-08-25 DIAGNOSIS — I10 Essential (primary) hypertension: Secondary | ICD-10-CM | POA: Diagnosis not present

## 2019-08-25 DIAGNOSIS — I480 Paroxysmal atrial fibrillation: Secondary | ICD-10-CM

## 2019-08-25 NOTE — Patient Instructions (Signed)
Medication Instructions:  Your physician recommends that you continue on your current medications as directed. Please refer to the Current Medication list given to you today.  *If you need a refill on your cardiac medications before your next appointment, please call your pharmacy*  Lab Work: NONE  If you have labs (blood work) drawn today and your tests are completely normal, you will receive your results only by: . MyChart Message (if you have MyChart) OR . A paper copy in the mail If you have any lab test that is abnormal or we need to change your treatment, we will call you to review the results.  Testing/Procedures: NONE   Follow-Up: At [facility], you and your health needs are our priority.  As part of our continuing mission to provide you with exceptional heart care, we have created designated Provider Care Teams.  These Care Teams include your primary Cardiologist (physician) and Advanced Practice Providers (APPs -  Physician Assistants and Nurse Practitioners) who all work together to provide you with the care you need, when you need it.  Your next appointment:   9 month(s)  The format for your next appointment:   In Person  Provider:   Paula Ross, MD  Other Instructions Thank you for choosing [facility]!    

## 2019-09-19 ENCOUNTER — Ambulatory Visit: Payer: Medicare Other | Attending: Internal Medicine

## 2019-09-19 DIAGNOSIS — Z23 Encounter for immunization: Secondary | ICD-10-CM

## 2019-09-19 NOTE — Progress Notes (Signed)
   Covid-19 Vaccination Clinic  Name:  Megan [name]    MRN: [id number] DOB: 1950-12-28  09/19/2019  [name] was observed post Covid-19 immunization for 15 minutes without incident. She was provided with Vaccine Information Sheet and instruction to access the V-Safe system.   [name] was instructed to call 911 with any severe reactions post vaccine: Marland Kitchen Difficulty breathing  . Swelling of face and throat  . A fast heartbeat  . A bad rash all over body  . Dizziness and weakness   Immunizations Administered    Name Date Dose VIS Date Route   Moderna COVID-19 Vaccine 09/19/2019  8:23 AM 0.5 mL 06/17/2019 Intramuscular   Manufacturer: Moderna   Lot: [id number]   NDC: 58309-407-68

## 2019-09-21 ENCOUNTER — Ambulatory Visit: Payer: Medicare Other

## 2019-10-21 ENCOUNTER — Ambulatory Visit: Payer: Medicare Other | Attending: Internal Medicine

## 2019-10-21 DIAGNOSIS — Z23 Encounter for immunization: Secondary | ICD-10-CM

## 2019-10-21 NOTE — Progress Notes (Signed)
   Covid-19 Vaccination Clinic  Name:  Megan [name]    MRN: [id number] DOB: 10-28-1950  10/21/2019  [name] was observed post Covid-19 immunization for 15 minutes without incident. She was provided with Vaccine Information Sheet and instruction to access the V-Safe system.   [name] was instructed to call 911 with any severe reactions post vaccine: Marland Kitchen Difficulty breathing  . Swelling of face and throat  . A fast heartbeat  . A bad rash all over body  . Dizziness and weakness   Immunizations Administered    Name Date Dose VIS Date Route   Moderna COVID-19 Vaccine 10/21/2019  8:27 AM 0.5 mL 06/17/2019 Intramuscular   Manufacturer: Moderna   Lot: [id number]   NDC: 40992-780-04

## 2019-10-29 NOTE — H&P (Signed)
Surgical History & Physical  Patient Name: Megan [name] DOB: 1951/01/02  Surgery: Cataract extraction with intraocular lens implant phacoemulsification; Right Eye  Surgeon: Megan Pierce MD Surgery Date:  11/10/2019 Pre-Op Date:  10/13/2019  HPI: A 69 Yr. old female patient -referred by Dr. Charise [name] for cataract OD 1. 1. The patient is here for a cataract evaluation. The right eye is affected. Status post cataract surgery, which began 1 years ago: Since the last visit, the affected area decreased. [name] vision is blurry. The condition's severity is constant and moderate. Per husband, patient having more difficulty getting around and seems to be having a hard time seeing overall. c/o glare so she does not drive at night anymore. Pt states she can not read even with her readers. This is negatively affecting the patient's quality of life. Last A1c was 6.5 HPI Completed by Dr. 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  Social   Never smoked   Medication Metformin, Escitalopram, Pantoprazole, Levothyroxine Sodium, Pioglitazone HCL, Hydrochlorothiazide, Atorvastatin, Metoprolol, Eliquis, Enalapril, Vitamin D3,   Sx/Procedures  None  Drug Allergies   NKDA  History & Physical: Heent:  Cataract, Right eye NECK: supple without bruits LUNGS: lungs clear to auscultation CV: regular rate and rhythm Abdomen: soft and non-tender  Impression & Plan: Assessment: 1.  COMBINED FORMS AGE RELATED CATARACT; Both Eyes (H25.813) 2.  DERMATOCHALASIS, no surgery; Right Upper Lid, Left Upper Lid (H02.831, P37.902)  Plan: 1.  Cataract accounts for the patient's decreased vision. This visual impairment is not correctable with a tolerable change in glasses or contact  lenses. Cataract surgery with an implantation of a new lens should significantly improve the visual and functional status of the patient. Discussed all risks, benefits, alternatives, and potential complications. Discussed the procedures and recovery. Patient desires to have surgery. A-scan ordered and performed today for intra-ocular lens calculations. The surgery will be performed in order to improve vision for driving, reading, and for eye examinations. Recommend phacoemulsification with intra-ocular lens. Right Eye worse - first. Dilates well - shugarcaine by protocol. 2.  Asymptomatic, recommend observation for now. Findings, prognosis and treatment options reviewed.

## 2019-11-05 NOTE — Patient Instructions (Signed)
Megan [name]  11/05/2019     @PREFPERIOPPHARMACY @   Your procedure is scheduled on  11/10/2019   Report to [facility] at  [place].M.  Call this number if you have problems the morning of surgery:  [phone]   Remember:  Do not eat or drink after midnight.                      Take these medicines the morning of surgery with A SIP OF WATER  Clonazepam, lexapro, levothyroxine, metoprolol, protonix, Deltasone. DO NOT take any medications for diabetes the morning of your procedure.    Do not wear jewelry, make-up or nail polish.  Do not wear lotions, powders, or perfumes. Please wear deodorant and brush your teeth.  Do not shave 48 hours prior to surgery.  Men may shave face and neck.  Do not bring valuables to the hospital.  [facility] is not responsible for any belongings or valuables.  Contacts, dentures or bridgework may not be worn into surgery.  Leave your suitcase in the car.  After surgery it may be brought to your room.  For patients admitted to the hospital, discharge time will be determined by your treatment team.  Patients discharged the day of surgery will not be allowed to drive home.   Name and phone number of your driver:   family Special instructions:  DO NOT smoke the morning of your procedure.  Please read over the following fact sheets that you were given. Anesthesia Post-op Instructions and Care and Recovery After Surgery       Cataract Surgery, Care After This sheet gives you information about how to care for yourself after your procedure. Your health care provider may also give you more specific instructions. If you have problems or questions, contact your health care provider. What can I expect after the procedure? After the procedure, it is common to have:  Itching.  Discomfort.  Fluid discharge.  Sensitivity to light and to touch.  Bruising in or around the eye.  Mild blurred vision. Follow these instructions at  home: Eye care   Do not touch or rub your eyes.  Protect your eyes as told by your health care provider. You may be told to wear a protective eye shield or sunglasses.  Do not put a contact lens into the affected eye or eyes until your health care provider approves.  Keep the area around your eye clean and dry: ? Avoid swimming. ? Do not allow water to hit you directly in the face while showering. ? Keep soap and shampoo out of your eyes.  Check your eye every day for signs of infection. Watch for: ? Redness, swelling, or pain. ? Fluid, blood, or pus. ? Warmth. ? A bad smell. ? Vision that is getting worse. ? Sensitivity that is getting worse. Activity  Do not drive for 24 hours if you were given a sedative during your procedure.  Avoid strenuous activities, such as playing contact sports, for as long as told by your health care provider.  Do not drive or use heavy machinery until your health care provider approves.  Do not bend or lift heavy objects. Bending increases pressure in the eye. You can walk, climb stairs, and do light household chores.  Ask your health care provider when you can return to work. If you work in a dusty environment, you may be advised to wear protective eyewear for a  period of time. General instructions  Take or apply over-the-counter and prescription medicines only as told by your health care provider. This includes eye drops.  Keep all follow-up visits as told by your health care provider. This is important. Contact a health care provider if:  You have increased bruising around your eye.  You have pain that is not helped with medicine.  You have a fever.  You have redness, swelling, or pain in your eye.  You have fluid, blood, or pus coming from your incision.  Your vision gets worse.  Your sensitivity to light gets worse. Get help right away if:  You have sudden loss of vision.  You see flashes of light or spots (floaters).  You  have severe eye pain.  You develop nausea or vomiting. Summary  After your procedure, it is common to have itching, discomfort, bruising, fluid discharge, or sensitivity to light.  Follow instructions from your health care provider about caring for your eye after the procedure.  Do not rub your eye after the procedure. You may need to wear eye protection or sunglasses. Do not wear contact lenses. Keep the area around your eye clean and dry.  Avoid activities that require a lot of effort. These include playing sports and lifting heavy objects.  Contact a health care provider if you have increased bruising, pain that does not go away, or a fever. Get help right away if you suddenly lose your vision, see flashes of light or spots, or have severe pain in the eye. This information is not intended to replace advice given to you by your health care provider. Make sure you discuss any questions you have with your health care provider. Document Revised: 04/29/2019 Document Reviewed: 12/31/2017 Elsevier Patient Education  2020 [employer] Monitored Anesthesia Care, Care After These instructions provide you with information about caring for yourself after your procedure. Your health care provider may also give you more specific instructions. Your treatment has been planned according to current medical practices, but problems sometimes occur. Call your health care provider if you have any problems or questions after your procedure. What can I expect after the procedure? After your procedure, you may:  Feel sleepy for several hours.  Feel clumsy and have poor balance for several hours.  Feel forgetful about what happened after the procedure.  Have poor judgment for several hours.  Feel nauseous or vomit.  Have a sore throat if you had a breathing tube during the procedure. Follow these instructions at home: For at least 24 hours after the procedure:      Have a responsible adult stay  with you. It is important to have someone help care for you until you are awake and alert.  Rest as needed.  Do not: ? Participate in activities in which you could fall or become injured. ? Drive. ? Use heavy machinery. ? Drink alcohol. ? Take sleeping pills or medicines that cause drowsiness. ? Make important decisions or sign legal documents. ? Take care of children on your own. Eating and drinking  Follow the diet that is recommended by your health care provider.  If you vomit, drink water, juice, or soup when you can drink without vomiting.  Make sure you have little or no nausea before eating solid foods. General instructions  Take over-the-counter and prescription medicines only as told by your health care provider.  If you have sleep apnea, surgery and certain medicines can increase your risk for breathing problems. Follow instructions from  your health care provider about wearing your sleep device: ? Anytime you are sleeping, including during daytime naps. ? While taking prescription pain medicines, sleeping medicines, or medicines that make you drowsy.  If you smoke, do not smoke without supervision.  Keep all follow-up visits as told by your health care provider. This is important. Contact a health care provider if:  You keep feeling nauseous or you keep vomiting.  You feel light-headed.  You develop a rash.  You have a fever. Get help right away if:  You have trouble breathing. Summary  For several hours after your procedure, you may feel sleepy and have poor judgment.  Have a responsible adult stay with you for at least 24 hours or until you are awake and alert. This information is not intended to replace advice given to you by your health care provider. Make sure you discuss any questions you have with your health care provider. Document Revised: 10/01/2017 Document Reviewed: 10/24/2015 Elsevier Patient Education  2020 [employer].

## 2019-11-06 ENCOUNTER — Encounter (HOSPITAL_COMMUNITY): Payer: Self-pay

## 2019-11-06 ENCOUNTER — Encounter (HOSPITAL_COMMUNITY)
Admission: RE | Admit: 2019-11-06 | Discharge: 2019-11-06 | Disposition: A | Discharge status: Home / Self Care | Payer: Medicare Other | Source: Ambulatory Visit | Attending: Ophthalmology | Admitting: Ophthalmology

## 2019-11-06 ENCOUNTER — Other Ambulatory Visit (HOSPITAL_COMMUNITY)
Admission: RE | Admit: 2019-11-06 | Discharge: 2019-11-06 | Disposition: A | Discharge status: Home / Self Care | Payer: Medicare Other | Source: Ambulatory Visit | Attending: Ophthalmology | Admitting: Ophthalmology

## 2019-11-06 ENCOUNTER — Other Ambulatory Visit: Payer: Self-pay

## 2019-11-06 DIAGNOSIS — Z20822 Contact with and (suspected) exposure to covid-19: Secondary | ICD-10-CM | POA: Insufficient documentation

## 2019-11-06 DIAGNOSIS — Z01812 Encounter for preprocedural laboratory examination: Secondary | ICD-10-CM | POA: Insufficient documentation

## 2019-11-06 LAB — BASIC METABOLIC PANEL
Anion gap: 11 (ref 5–15)
BUN: 26 mg/dL — ABNORMAL HIGH (ref 8–23)
CO2: 28 mmol/L (ref 22–32)
Calcium: 9.9 mg/dL (ref 8.9–10.3)
Chloride: 94 mmol/L — ABNORMAL LOW (ref 98–111)
Creatinine, Ser: 1.28 mg/dL — ABNORMAL HIGH (ref 0.44–1.00)
GFR calc Af Amer: 49 mL/min — ABNORMAL LOW (ref >60)
GFR calc non Af Amer: 43 mL/min — ABNORMAL LOW (ref >60)
Glucose, Bld: 182 mg/dL — ABNORMAL HIGH (ref 70–99)
Potassium: 4.5 mmol/L (ref 3.5–5.1)
Sodium: 133 mmol/L — ABNORMAL LOW (ref 135–145)

## 2019-11-06 LAB — HEMOGLOBIN A1C
Hgb A1c MFr Bld: 8.6 % — ABNORMAL HIGH (ref 4.8–5.6)
Mean Plasma Glucose: 200.12 mg/dL

## 2019-11-07 LAB — SARS CORONAVIRUS 2 (TAT 6-24 HRS): SARS Coronavirus 2: NEGATIVE

## 2019-11-10 ENCOUNTER — Ambulatory Visit (HOSPITAL_COMMUNITY): Payer: Medicare Other | Admitting: Anesthesiology

## 2019-11-10 ENCOUNTER — Encounter (HOSPITAL_COMMUNITY): Payer: Self-pay | Admitting: Ophthalmology

## 2019-11-10 ENCOUNTER — Ambulatory Visit (HOSPITAL_COMMUNITY)
Admission: RE | Admit: 2019-11-10 | Discharge: 2019-11-10 | Disposition: A | Discharge status: Home / Self Care | Payer: Medicare Other | Attending: Ophthalmology | Admitting: Ophthalmology

## 2019-11-10 ENCOUNTER — Encounter (HOSPITAL_COMMUNITY)
Admission: RE | Disposition: A | Discharge status: Home / Self Care | Payer: Self-pay | Source: Home / Self Care | Attending: Ophthalmology

## 2019-11-10 ENCOUNTER — Other Ambulatory Visit: Payer: Self-pay

## 2019-11-10 DIAGNOSIS — Z7901 Long term (current) use of anticoagulants: Secondary | ICD-10-CM | POA: Insufficient documentation

## 2019-11-10 DIAGNOSIS — H02834 Dermatochalasis of left upper eyelid: Secondary | ICD-10-CM | POA: Diagnosis not present

## 2019-11-10 DIAGNOSIS — E039 Hypothyroidism, unspecified: Secondary | ICD-10-CM | POA: Diagnosis not present

## 2019-11-10 DIAGNOSIS — Z7984 Long term (current) use of oral hypoglycemic drugs: Secondary | ICD-10-CM | POA: Insufficient documentation

## 2019-11-10 DIAGNOSIS — Z87891 Personal history of nicotine dependence: Secondary | ICD-10-CM | POA: Diagnosis not present

## 2019-11-10 DIAGNOSIS — H02831 Dermatochalasis of right upper eyelid: Secondary | ICD-10-CM | POA: Insufficient documentation

## 2019-11-10 DIAGNOSIS — I10 Essential (primary) hypertension: Secondary | ICD-10-CM | POA: Insufficient documentation

## 2019-11-10 DIAGNOSIS — K219 Gastro-esophageal reflux disease without esophagitis: Secondary | ICD-10-CM | POA: Diagnosis not present

## 2019-11-10 DIAGNOSIS — Z79899 Other long term (current) drug therapy: Secondary | ICD-10-CM | POA: Insufficient documentation

## 2019-11-10 DIAGNOSIS — F419 Anxiety disorder, unspecified: Secondary | ICD-10-CM | POA: Diagnosis not present

## 2019-11-10 DIAGNOSIS — E1136 Type 2 diabetes mellitus with diabetic cataract: Secondary | ICD-10-CM | POA: Diagnosis not present

## 2019-11-10 DIAGNOSIS — Z8673 Personal history of transient ischemic attack (TIA), and cerebral infarction without residual deficits: Secondary | ICD-10-CM | POA: Insufficient documentation

## 2019-11-10 DIAGNOSIS — F329 Major depressive disorder, single episode, unspecified: Secondary | ICD-10-CM | POA: Insufficient documentation

## 2019-11-10 DIAGNOSIS — H2521 Age-related cataract, morgagnian type, right eye: Secondary | ICD-10-CM | POA: Diagnosis present

## 2019-11-10 DIAGNOSIS — D649 Anemia, unspecified: Secondary | ICD-10-CM | POA: Diagnosis not present

## 2019-11-10 HISTORY — PX: CATARACT EXTRACTION W/PHACO: SHX586

## 2019-11-10 LAB — GLUCOSE, CAPILLARY: Glucose-Capillary: 135 mg/dL — ABNORMAL HIGH (ref 70–99)

## 2019-11-10 SURGERY — PHACOEMULSIFICATION, CATARACT, WITH IOL INSERTION
Anesthesia: Monitor Anesthesia Care | Site: Eye | Laterality: Right

## 2019-11-10 MED ORDER — LIDOCAINE HCL 3.5 % OP GEL
1.0000 "application " | Freq: Once | OPHTHALMIC | Status: AC
  Administered 2019-11-10: 1 via OPHTHALMIC

## 2019-11-10 MED ORDER — TRYPAN BLUE 0.06 % OP SOLN
OPHTHALMIC | Status: AC
  Filled 2019-11-10: qty 0.5

## 2019-11-10 MED ORDER — LIDOCAINE HCL (PF) 1 % IJ SOLN
INTRAOCULAR | Status: DC | PRN
  Administered 2019-11-10: 10:00:00 1 mL via OPHTHALMIC
  Administered 2019-11-10: .3 mL via OPHTHALMIC

## 2019-11-10 MED ORDER — PROVISC 10 MG/ML IO SOLN
INTRAOCULAR | Status: DC | PRN
  Administered 2019-11-10: 0.85 mL via INTRAOCULAR

## 2019-11-10 MED ORDER — MIDAZOLAM HCL 5 MG/5ML IJ SOLN
INTRAMUSCULAR | Status: DC | PRN
  Administered 2019-11-10 (×2): 1 mg via INTRAVENOUS

## 2019-11-10 MED ORDER — TETRACAINE 0.5 % OP SOLN OPTIME - NO CHARGE
OPHTHALMIC | Status: DC | PRN
  Administered 2019-11-10: 1 [drp] via OPHTHALMIC

## 2019-11-10 MED ORDER — MIDAZOLAM HCL 2 MG/2ML IJ SOLN
INTRAMUSCULAR | Status: AC
  Filled 2019-11-10: qty 2

## 2019-11-10 MED ORDER — CYCLOPENTOLATE-PHENYLEPHRINE 0.2-1 % OP SOLN
1.0000 [drp] | OPHTHALMIC | Status: AC | PRN
  Administered 2019-11-10 (×3): 1 [drp] via OPHTHALMIC

## 2019-11-10 MED ORDER — TETRACAINE HCL 0.5 % OP SOLN
1.0000 [drp] | OPHTHALMIC | Status: AC | PRN
  Administered 2019-11-10 (×3): 1 [drp] via OPHTHALMIC

## 2019-11-10 MED ORDER — SODIUM HYALURONATE 23 MG/ML IO SOLN
INTRAOCULAR | Status: DC | PRN
  Administered 2019-11-10: 0.6 mL via INTRAOCULAR

## 2019-11-10 MED ORDER — BSS IO SOLN
INTRAOCULAR | Status: DC | PRN
  Administered 2019-11-10: 15 mL via INTRAOCULAR

## 2019-11-10 MED ORDER — PHENYLEPHRINE HCL 2.5 % OP SOLN
1.0000 [drp] | OPHTHALMIC | Status: AC | PRN
  Administered 2019-11-10 (×3): 1 [drp] via OPHTHALMIC

## 2019-11-10 MED ORDER — EPINEPHRINE PF 1 MG/ML IJ SOLN
INTRAOCULAR | Status: DC | PRN
  Administered 2019-11-10: 10:00:00 500 mL

## 2019-11-10 MED ORDER — POVIDONE-IODINE 5 % OP SOLN
OPHTHALMIC | Status: DC | PRN
  Administered 2019-11-10: 1 via OPHTHALMIC

## 2019-11-10 MED ORDER — TRYPAN BLUE 0.06 % OP SOLN
OPHTHALMIC | Status: DC | PRN
  Administered 2019-11-10: 0.5 mL via INTRAOCULAR

## 2019-11-10 MED ORDER — NEOMYCIN-POLYMYXIN-DEXAMETH 3.5-10000-0.1 OP SUSP
OPHTHALMIC | Status: DC | PRN
  Administered 2019-11-10: 1 [drp] via OPHTHALMIC

## 2019-11-10 SURGICAL SUPPLY — 12 items
CLOTH BEACON ORANGE TIMEOUT ST (SAFETY) ×3 IMPLANT
EYE SHIELD UNIVERSAL CLEAR (GAUZE/BANDAGES/DRESSINGS) ×3 IMPLANT
GLOVE BIOGEL PI IND STRL 7.0 (GLOVE) ×2 IMPLANT
GLOVE BIOGEL PI INDICATOR 7.0 (GLOVE) ×4
LENS ALC ACRYL/TECN (Ophthalmic Related) ×3 IMPLANT
NEEDLE HYPO 18GX1.5 BLUNT FILL (NEEDLE) ×3 IMPLANT
PAD ARMBOARD 7.5X6 YLW CONV (MISCELLANEOUS) ×3 IMPLANT
SYR TB 1ML LL NO SAFETY (SYRINGE) ×3 IMPLANT
TAPE SURG TRANSPORE 1 IN (GAUZE/BANDAGES/DRESSINGS) ×1 IMPLANT
TAPE SURGICAL TRANSPORE 1 IN (GAUZE/BANDAGES/DRESSINGS) ×3
VISCOELASTIC ADDITIONAL (OPHTHALMIC RELATED) ×3 IMPLANT
WATER STERILE IRR 250ML POUR (IV SOLUTION) ×3 IMPLANT

## 2019-11-10 NOTE — Anesthesia Postprocedure Evaluation (Signed)
Anesthesia Post Note  Patient: Megan [name]  Procedure(s) Performed: CATARACT EXTRACTION PHACO AND INTRAOCULAR LENS PLACEMENT RIGHT EYE (Right Eye)  Patient location during evaluation: Short Stay Anesthesia Type: MAC Level of consciousness: awake and alert Pain management: pain level controlled Vital Signs Assessment: post-procedure vital signs reviewed and stable Respiratory status: spontaneous breathing Cardiovascular status: blood pressure returned to baseline and stable Postop Assessment: no apparent nausea or vomiting Anesthetic complications: no     Last Vitals:  Vitals:   11/10/19 0902  BP: (!) 140/55  Pulse: (!) 51  Resp: 15  Temp: 36.8 C  SpO2: 100%    Last Pain:  Vitals:   11/10/19 0902  TempSrc: Oral  PainSc: 0-No pain                 Saurabh Hettich

## 2019-11-10 NOTE — Discharge Instructions (Addendum)
Please discharge patient when stable, will follow up today with Dr. Wrzosek at the [facility] office immediately following discharge.  Leave shield in place until visit.  All paperwork with discharge instructions will be given at the office.  [facility] Address:  [address]  [place], [place] [address]             Monitored Anesthesia Care, Care After These instructions provide you with information about caring for yourself after your procedure. Your health care provider may also give you more specific instructions. Your treatment has been planned according to current medical practices, but problems sometimes occur. Call your health care provider if you have any problems or questions after your procedure. What can I expect after the procedure? After your procedure, you may:  Feel sleepy for several hours.  Feel clumsy and have poor balance for several hours.  Feel forgetful about what happened after the procedure.  Have poor judgment for several hours.  Feel nauseous or vomit.  Have a sore throat if you had a breathing tube during the procedure. Follow these instructions at home: For at least 24 hours after the procedure:      Have a responsible adult stay with you. It is important to have someone help care for you until you are awake and alert.  Rest as needed.  Do not: ? Participate in activities in which you could fall or become injured. ? Drive. ? Use heavy machinery. ? Drink alcohol. ? Take sleeping pills or medicines that cause drowsiness. ? Make important decisions or sign legal documents. ? Take care of children on your own. Eating and drinking  Follow the diet that is recommended by your health care provider.  If you vomit, drink water, juice, or soup when you can drink without vomiting.  Make sure you have little or no nausea before eating solid foods. General instructions  Take over-the-counter and  prescription medicines only as told by your health care provider.  If you have sleep apnea, surgery and certain medicines can increase your risk for breathing problems. Follow instructions from your health care provider about wearing your sleep device: ? Anytime you are sleeping, including during daytime naps. ? While taking prescription pain medicines, sleeping medicines, or medicines that make you drowsy.  If you smoke, do not smoke without supervision.  Keep all follow-up visits as told by your health care provider. This is important. Contact a health care provider if:  You keep feeling nauseous or you keep vomiting.  You feel light-headed.  You develop a rash.  You have a fever. Get help right away if:  You have trouble breathing. Summary  For several hours after your procedure, you may feel sleepy and have poor judgment.  Have a responsible adult stay with you for at least 24 hours or until you are awake and alert. This information is not intended to replace advice given to you by your health care provider. Make sure you discuss any questions you have with your health care provider. Document Revised: 10/01/2017 Document Reviewed: 10/24/2015 Elsevier Patient Education  2020 Elsevier Inc.  

## 2019-11-10 NOTE — Interval H&P Note (Signed)
History and Physical Interval Note: The H and P was reviewed and updated. The patient was examined.  No changes were found after exam.  The surgical eye was marked.  11/10/2019 9:53 AM  [name]  has presented today for surgery, with the diagnosis of Nuclear sclerotic cataract - Right eye.  The various methods of treatment have been discussed with the patient and family. After consideration of risks, benefits and other options for treatment, the patient has consented to  Procedure(s) with comments: CATARACT EXTRACTION PHACO AND INTRAOCULAR LENS PLACEMENT RIGHT EYE (Right) - right as a surgical intervention.  [name] history has been reviewed, patient examined, no change in status, stable for surgery.  I have reviewed the patient's chart and labs.  Questions were answered to the patient's satisfaction.     Megan [name]

## 2019-11-10 NOTE — Op Note (Signed)
Date of procedure: 11/10/19  Pre-operative diagnosis: Mature Visually significant age-related cataract, Right Eye (H25.21)  Post-operative diagnosis: Mature Visually significant age-related cataract, Right Eye  Procedure: Complex Removal of cataract via phacoemulsification and insertion of intra-ocular lens AMO DCB00 +22.5D into the capsular bag of the Right Eye  Attending surgeon: Gerda Diss. Izsak Meir, MD, MA  Anesthesia: MAC, Topical Akten  Complications: None  Estimated Blood Loss: <89m (minimal)  Specimens: None  Implants: As above  Indications:  Mature Visually significant age-related cataract, Right Eye  Procedure:  The patient was seen and identified in the pre-operative area. The operative eye was identified and dilated.  The operative eye was marked.  Topical anesthesia was administered to the operative eye.     The patient was then to the operative suite and placed in the supine position.  A timeout was performed confirming the patient, procedure to be performed, and all other relevant information.   [name] face was prepped and draped in the usual fashion for intra-ocular surgery.  A lid speculum was placed into the operative eye and the surgical microscope moved into place and focused.  A lack of red reflex due to a mature cataract was confirmed.  A superotemporal paracentesis was created using a 20 gauge paracentesis blade.  Vision blue was injected into the anterior chamber.  Shugarcaine was injected into the anterior chamber.  Viscoelastic was injected into the anterior chamber.  A temporal clear-corneal main wound incision was created using a 2.41mmicrokeratome.  A continuous curvilinear capsulorrhexis was initiated using an irrigating cystitome and completed using capsulorrhexis forceps.  Hydrodissection and hydrodeliniation were performed.  Viscoelastic was injected into the anterior chamber.  A phacoemulsification handpiece and a chopper as a second instrument were used  to remove the nucleus and epinucleus. The irrigation/aspiration handpiece was used to remove any remaining cortical material. At this time, an anterior capsule tear without posterior extension was noted at 4:00.  The capsular bag was reinflated with viscoelastic, checked, and found to be intact. The intraocular lens was inserted into the capsular bag and dialed into place using a kuglen hook.  The irrigation/aspiration handpiece was used to remove any remaining viscoelastic.  The clear corneal wound and paracentesis wounds were then hydrated and checked with Weck-Cels to be watertight.  The lid-speculum and drape was removed, and the patient's face was cleaned with a wet and dry 4x4.  Maxitrol was instilled in the eye before a clear shield was taped over the eye. The patient was taken to the post-operative care unit in good condition, having tolerated the procedure well.  Post-Op Instructions: [facility] and will receive all other orders and instructions.

## 2019-11-10 NOTE — Anesthesia Preprocedure Evaluation (Signed)
Anesthesia Evaluation  Patient identified by MRN, date of birth, ID band Patient awake    Reviewed: Allergy & Precautions, NPO status , Patient's Chart, lab work & pertinent test results, reviewed documented beta blocker date and time   History of Anesthesia Complications (+) PONV and history of anesthetic complications  Airway Mallampati: II  TM Distance: >3 FB Neck ROM: Full    Dental  (+) Edentulous Upper, Edentulous Lower   Pulmonary former smoker,    Pulmonary exam normal breath sounds clear to auscultation       Cardiovascular Exercise Tolerance: Poor hypertension, Pt. on medications and Pt. on home beta blockers  Rhythm:Regular Rate:Normal     Neuro/Psych  Headaches, PSYCHIATRIC DISORDERS Anxiety Depression CVA, Residual Symptoms    GI/Hepatic PUD, GERD  Medicated and Controlled,  Endo/Other  diabetes, Well Controlled, Type 2Hypothyroidism   Renal/GU      Musculoskeletal  (+) Arthritis , Osteoarthritis,    Abdominal   Peds  Hematology  (+) anemia ,   Anesthesia Other Findings   Reproductive/Obstetrics                            Anesthesia Physical Anesthesia Plan  ASA: III  Anesthesia Plan: MAC   Post-op Pain Management:    Induction:   PONV Risk Score and Plan: 2 and Treatment may vary due to age or medical condition  Airway Management Planned: Nasal Cannula and Natural Airway  Additional Equipment:   Intra-op Plan:   Post-operative Plan:   Informed Consent: I have reviewed the patients History and Physical, chart, labs and discussed the procedure including the risks, benefits and alternatives for the proposed anesthesia with the patient or authorized representative who has indicated his/her understanding and acceptance.     Dental advisory given  Plan Discussed with: CRNA and Surgeon  Anesthesia Plan Comments:         Anesthesia Quick Evaluation

## 2019-11-10 NOTE — Transfer of Care (Signed)
Immediate Anesthesia Transfer of Care Note  Patient: [place]  Procedure(s) Performed: CATARACT EXTRACTION PHACO AND INTRAOCULAR LENS PLACEMENT RIGHT EYE (Right Eye)  Patient Location: Short Stay  Anesthesia Type:MAC  Level of Consciousness: awake  Airway & Oxygen Therapy: Patient Spontanous Breathing  Post-op Assessment: Report given to RN  Post vital signs: Reviewed  Last Vitals:  Vitals Value Taken Time  BP    Temp    Pulse    Resp    SpO2      Last Pain:  Vitals:   11/10/19 0902  TempSrc: Oral  PainSc: 0-No pain      Patients Stated Pain Goal: 5 (79/89/21 1941)  Complications: No apparent anesthesia complications

## 2019-11-18 NOTE — H&P (Signed)
Surgical History & Physical  Patient Name: Megan [name] DOB: 1950/09/08  Surgery: Cataract extraction with intraocular lens implant phacoemulsification; Left Eye  Surgeon: [name] MD Surgery Date:  11/24/2019 Pre-Op Date:  11/17/2019  HPI: A 26 Yr. old female patient 1. 1. The patient is returning after cataract surgery. The right eye is affected. Status post cataract surgery on 11-10-2019: Since the last visit, the affected area feels improvement and is doing well. [name] vision is improved. Patient is following medication instructions. Patient ready to proceed with OS due to difference between OU. OS very blurred and cloudy c/w OD. OD bright and clear now c/w OS blurred distance and near. This is negatively affecting the patient's quality of life. HPI Completed by Dr. [name]  Medical History: Cataracts Diabetes High Blood Pressure LDL Stroke Thyroid Problems  Review of Systems memory issues 2nd stroke Musculoskeletal Joint Ache, Stiffness Neurological Stroke All recorded systems are negative except as noted above.  Social   Never smoked  Medication Prednisolone-gatiflox-bromfenac,  Metformin, Escitalopram, Pantoprazole, Levothyroxine Sodium, Pioglitazone HCL, Hydrochlorothiazide, Atorvastatin, Metoprolol, Eliquis, Enalapril, Vitamin D3,   Sx/Procedures Phaco c IOL OD,   Drug Allergies   NKDA  History & Physical: Heent:  Cataract, Left eye NECK: supple without bruits LUNGS: lungs clear to auscultation CV: regular rate and rhythm Abdomen: soft and non-tender  Impression & Plan: Assessment: 1.  CATARACT EXTRACTION STATUS; Right Eye (Z98.41) 2.  COMBINED FORMS AGE RELATED CATARACT; Left Eye (H25.812) 3.  NUCLEAR SCLEROSIS AGE RELATED; Left Eye (H25.12) 4.  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  1 week after cataract surgery. Doing well with improved vision and normal eye pressure. Call with any problems or  concerns. Continue Gati-Brom-Pred 2x/day for 3 more weeks. 2.  Dilates well - shugarcaine by protocol. Cataract accounts for the patient's decreased vision. This visual impairment is not correctable with a tolerable change in glasses or contact lenses. Cataract surgery with an implantation of a new lens should significantly improve the visual and functional status of the patient. Discussed all risks, benefits, alternatives, and potential complications. Discussed the procedures and recovery. Patient desires to have surgery. A-scan ordered and performed today for intra-ocular lens calculations. The surgery will be performed in order to improve vision for driving, reading, and for eye examinations. Recommend phacoemulsification with intra-ocular lens. Left Eye. Surgery required to correct imbalance of vision.

## 2019-11-20 ENCOUNTER — Encounter (HOSPITAL_COMMUNITY)
Admission: RE | Admit: 2019-11-20 | Discharge: 2019-11-20 | Disposition: A | Discharge status: Home / Self Care | Payer: Medicare Other | Source: Ambulatory Visit | Attending: Ophthalmology | Admitting: Ophthalmology

## 2019-11-20 ENCOUNTER — Other Ambulatory Visit: Payer: Self-pay

## 2019-11-21 ENCOUNTER — Other Ambulatory Visit (HOSPITAL_COMMUNITY)
Admission: RE | Admit: 2019-11-21 | Discharge: 2019-11-21 | Disposition: A | Discharge status: Home / Self Care | Payer: Medicare Other | Source: Ambulatory Visit | Attending: Ophthalmology | Admitting: Ophthalmology

## 2019-11-21 ENCOUNTER — Other Ambulatory Visit: Payer: Self-pay

## 2019-11-21 DIAGNOSIS — Z01812 Encounter for preprocedural laboratory examination: Secondary | ICD-10-CM | POA: Insufficient documentation

## 2019-11-21 DIAGNOSIS — Z20822 Contact with and (suspected) exposure to covid-19: Secondary | ICD-10-CM | POA: Insufficient documentation

## 2019-11-22 LAB — SARS CORONAVIRUS 2 (TAT 6-24 HRS): SARS Coronavirus 2: NEGATIVE

## 2019-11-24 ENCOUNTER — Ambulatory Visit (HOSPITAL_COMMUNITY)
Admission: RE | Admit: 2019-11-24 | Discharge: 2019-11-24 | Disposition: A | Discharge status: Home / Self Care | Payer: Medicare Other | Attending: Ophthalmology | Admitting: Ophthalmology

## 2019-11-24 ENCOUNTER — Ambulatory Visit (HOSPITAL_COMMUNITY): Payer: Medicare Other | Admitting: Anesthesiology

## 2019-11-24 ENCOUNTER — Encounter (HOSPITAL_COMMUNITY)
Admission: RE | Disposition: A | Discharge status: Home / Self Care | Payer: Self-pay | Source: Home / Self Care | Attending: Ophthalmology

## 2019-11-24 ENCOUNTER — Encounter (HOSPITAL_COMMUNITY): Payer: Self-pay | Admitting: Ophthalmology

## 2019-11-24 DIAGNOSIS — H47011 Ischemic optic neuropathy, right eye: Secondary | ICD-10-CM | POA: Insufficient documentation

## 2019-11-24 DIAGNOSIS — Z7901 Long term (current) use of anticoagulants: Secondary | ICD-10-CM | POA: Diagnosis not present

## 2019-11-24 DIAGNOSIS — E039 Hypothyroidism, unspecified: Secondary | ICD-10-CM | POA: Diagnosis not present

## 2019-11-24 DIAGNOSIS — E1136 Type 2 diabetes mellitus with diabetic cataract: Secondary | ICD-10-CM | POA: Insufficient documentation

## 2019-11-24 DIAGNOSIS — Z8673 Personal history of transient ischemic attack (TIA), and cerebral infarction without residual deficits: Secondary | ICD-10-CM | POA: Diagnosis not present

## 2019-11-24 DIAGNOSIS — Z87891 Personal history of nicotine dependence: Secondary | ICD-10-CM | POA: Insufficient documentation

## 2019-11-24 DIAGNOSIS — Z7984 Long term (current) use of oral hypoglycemic drugs: Secondary | ICD-10-CM | POA: Insufficient documentation

## 2019-11-24 DIAGNOSIS — Z9841 Cataract extraction status, right eye: Secondary | ICD-10-CM | POA: Insufficient documentation

## 2019-11-24 DIAGNOSIS — F419 Anxiety disorder, unspecified: Secondary | ICD-10-CM | POA: Diagnosis not present

## 2019-11-24 DIAGNOSIS — H25812 Combined forms of age-related cataract, left eye: Secondary | ICD-10-CM | POA: Diagnosis not present

## 2019-11-24 DIAGNOSIS — Z79899 Other long term (current) drug therapy: Secondary | ICD-10-CM | POA: Diagnosis not present

## 2019-11-24 DIAGNOSIS — K219 Gastro-esophageal reflux disease without esophagitis: Secondary | ICD-10-CM | POA: Insufficient documentation

## 2019-11-24 DIAGNOSIS — I10 Essential (primary) hypertension: Secondary | ICD-10-CM | POA: Insufficient documentation

## 2019-11-24 DIAGNOSIS — F329 Major depressive disorder, single episode, unspecified: Secondary | ICD-10-CM | POA: Diagnosis not present

## 2019-11-24 DIAGNOSIS — Z79890 Hormone replacement therapy (postmenopausal): Secondary | ICD-10-CM | POA: Insufficient documentation

## 2019-11-24 HISTORY — PX: CATARACT EXTRACTION W/PHACO: SHX586

## 2019-11-24 LAB — GLUCOSE, CAPILLARY: Glucose-Capillary: 98 mg/dL (ref 70–99)

## 2019-11-24 SURGERY — PHACOEMULSIFICATION, CATARACT, WITH IOL INSERTION
Anesthesia: Monitor Anesthesia Care | Site: Eye | Laterality: Left

## 2019-11-24 MED ORDER — CYCLOPENTOLATE-PHENYLEPHRINE 0.2-1 % OP SOLN
OPHTHALMIC | Status: AC
  Filled 2019-11-24: qty 2

## 2019-11-24 MED ORDER — SODIUM HYALURONATE 23 MG/ML IO SOLN
INTRAOCULAR | Status: DC | PRN
  Administered 2019-11-24: 0.6 mL via INTRAOCULAR

## 2019-11-24 MED ORDER — MIDAZOLAM HCL 2 MG/2ML IJ SOLN
INTRAMUSCULAR | Status: AC
  Filled 2019-11-24: qty 2

## 2019-11-24 MED ORDER — PHENYLEPHRINE HCL 2.5 % OP SOLN
1.0000 [drp] | OPHTHALMIC | Status: AC | PRN
  Administered 2019-11-24 (×3): 1 [drp] via OPHTHALMIC

## 2019-11-24 MED ORDER — POVIDONE-IODINE 5 % OP SOLN
OPHTHALMIC | Status: DC | PRN
  Administered 2019-11-24: 1 via OPHTHALMIC

## 2019-11-24 MED ORDER — BSS IO SOLN
INTRAOCULAR | Status: DC | PRN
  Administered 2019-11-24: 15 mL via INTRAOCULAR

## 2019-11-24 MED ORDER — LIDOCAINE HCL (PF) 1 % IJ SOLN
INTRAOCULAR | Status: DC | PRN
  Administered 2019-11-24: 1 mL via OPHTHALMIC

## 2019-11-24 MED ORDER — NEOMYCIN-POLYMYXIN-DEXAMETH 3.5-10000-0.1 OP SUSP
OPHTHALMIC | Status: AC
  Filled 2019-11-24: qty 5

## 2019-11-24 MED ORDER — EPINEPHRINE PF 1 MG/ML IJ SOLN
INTRAOCULAR | Status: DC | PRN
  Administered 2019-11-24: 500 mL

## 2019-11-24 MED ORDER — PROVISC 10 MG/ML IO SOLN
INTRAOCULAR | Status: DC | PRN
  Administered 2019-11-24: 0.85 mL via INTRAOCULAR

## 2019-11-24 MED ORDER — TETRACAINE HCL 0.5 % OP SOLN
1.0000 [drp] | OPHTHALMIC | Status: AC | PRN
  Administered 2019-11-24 (×3): 1 [drp] via OPHTHALMIC

## 2019-11-24 MED ORDER — TETRACAINE HCL 0.5 % OP SOLN
OPHTHALMIC | Status: AC
  Filled 2019-11-24: qty 4

## 2019-11-24 MED ORDER — LIDOCAINE HCL (PF) 1 % IJ SOLN
INTRAMUSCULAR | Status: AC
  Filled 2019-11-24: qty 2

## 2019-11-24 MED ORDER — MIDAZOLAM HCL 5 MG/5ML IJ SOLN
INTRAMUSCULAR | Status: DC | PRN
  Administered 2019-11-24: 2 mg via INTRAVENOUS

## 2019-11-24 MED ORDER — CYCLOPENTOLATE-PHENYLEPHRINE 0.2-1 % OP SOLN
1.0000 [drp] | OPHTHALMIC | Status: AC | PRN
  Administered 2019-11-24 (×3): 1 [drp] via OPHTHALMIC

## 2019-11-24 MED ORDER — LIDOCAINE HCL 3.5 % OP GEL
OPHTHALMIC | Status: AC
  Filled 2019-11-24: qty 1

## 2019-11-24 MED ORDER — LIDOCAINE HCL 3.5 % OP GEL
1.0000 "application " | Freq: Once | OPHTHALMIC | Status: AC
  Administered 2019-11-24: 1 via OPHTHALMIC

## 2019-11-24 MED ORDER — NEOMYCIN-POLYMYXIN-DEXAMETH 3.5-10000-0.1 OP SUSP
OPHTHALMIC | Status: DC | PRN
  Administered 2019-11-24: 1 [drp] via OPHTHALMIC

## 2019-11-24 MED ORDER — PHENYLEPHRINE HCL 2.5 % OP SOLN
OPHTHALMIC | Status: AC
  Filled 2019-11-24: qty 15

## 2019-11-24 SURGICAL SUPPLY — 17 items
CLOTH BEACON ORANGE TIMEOUT ST (SAFETY) ×2 IMPLANT
DEVICE MILOOP (MISCELLANEOUS) IMPLANT
EYE SHIELD UNIVERSAL CLEAR (GAUZE/BANDAGES/DRESSINGS) ×2 IMPLANT
GLOVE BIOGEL PI IND STRL 7.0 (GLOVE) IMPLANT
GLOVE BIOGEL PI INDICATOR 7.0 (GLOVE) ×4
LENS ALC ACRYL/TECN (Ophthalmic Related) ×2 IMPLANT
MILOOP DEVICE (MISCELLANEOUS)
NDL HYPO 18GX1.5 BLUNT FILL (NEEDLE) IMPLANT
NEEDLE HYPO 18GX1.5 BLUNT FILL (NEEDLE) ×3 IMPLANT
PAD ARMBOARD 7.5X6 YLW CONV (MISCELLANEOUS) ×2 IMPLANT
RING MALYGIN (MISCELLANEOUS) IMPLANT
RING MALYGIN 7.0 (MISCELLANEOUS) IMPLANT
SYR TB 1ML LL NO SAFETY (SYRINGE) ×2 IMPLANT
TAPE SURG TRANSPORE 1 IN (GAUZE/BANDAGES/DRESSINGS) IMPLANT
TAPE SURGICAL TRANSPORE 1 IN (GAUZE/BANDAGES/DRESSINGS) ×3
VISCOELASTIC ADDITIONAL (OPHTHALMIC RELATED) ×2 IMPLANT
WATER STERILE IRR 250ML POUR (IV SOLUTION) ×2 IMPLANT

## 2019-11-24 NOTE — Discharge Instructions (Addendum)
Please discharge patient when stable, will follow up today with Dr. Wrzosek at the [facility] office immediately following discharge.  Leave shield in place until visit.  All paperwork with discharge instructions will be given at the office.  [facility] Address:  [address]  [place], [place] [address]             Monitored Anesthesia Care, Care After These instructions provide you with information about caring for yourself after your procedure. Your health care provider may also give you more specific instructions. Your treatment has been planned according to current medical practices, but problems sometimes occur. Call your health care provider if you have any problems or questions after your procedure. What can I expect after the procedure? After your procedure, you may:  Feel sleepy for several hours.  Feel clumsy and have poor balance for several hours.  Feel forgetful about what happened after the procedure.  Have poor judgment for several hours.  Feel nauseous or vomit.  Have a sore throat if you had a breathing tube during the procedure. Follow these instructions at home: For at least 24 hours after the procedure:      Have a responsible adult stay with you. It is important to have someone help care for you until you are awake and alert.  Rest as needed.  Do not: ? Participate in activities in which you could fall or become injured. ? Drive. ? Use heavy machinery. ? Drink alcohol. ? Take sleeping pills or medicines that cause drowsiness. ? Make important decisions or sign legal documents. ? Take care of children on your own. Eating and drinking  Follow the diet that is recommended by your health care provider.  If you vomit, drink water, juice, or soup when you can drink without vomiting.  Make sure you have little or no nausea before eating solid foods. General instructions  Take over-the-counter and  prescription medicines only as told by your health care provider.  If you have sleep apnea, surgery and certain medicines can increase your risk for breathing problems. Follow instructions from your health care provider about wearing your sleep device: ? Anytime you are sleeping, including during daytime naps. ? While taking prescription pain medicines, sleeping medicines, or medicines that make you drowsy.  If you smoke, do not smoke without supervision.  Keep all follow-up visits as told by your health care provider. This is important. Contact a health care provider if:  You keep feeling nauseous or you keep vomiting.  You feel light-headed.  You develop a rash.  You have a fever. Get help right away if:  You have trouble breathing. Summary  For several hours after your procedure, you may feel sleepy and have poor judgment.  Have a responsible adult stay with you for at least 24 hours or until you are awake and alert. This information is not intended to replace advice given to you by your health care provider. Make sure you discuss any questions you have with your health care provider. Document Revised: 10/01/2017 Document Reviewed: 10/24/2015 Elsevier Patient Education  2020 [employer] Inc[employer]  

## 2019-11-24 NOTE — Op Note (Signed)
Date of procedure: 11/24/19  Pre-operative diagnosis: Visually significant age-related combined cataract, Left Eye (H25.812)  Post-operative diagnosis: Visually significant age-related combined cataract, Left Eye (H25.812)  Procedure: Removal of cataract via phacoemulsification and insertion of intra-ocular lens Wynetta Emery and [place]  +22.5D into the capsular bag of the Left Eye  Attending surgeon: Gerda Diss. Aleksandra Raben, MD, MA  Anesthesia: MAC, Topical Akten  Complications: None  Estimated Blood Loss: <42m (minimal)  Specimens: None  Implants: As above  Indications:  Visually significant age-related cataract, Left Eye  Procedure:  The patient was seen and identified in the pre-operative area. The operative eye was identified and dilated.  The operative eye was marked.  Topical anesthesia was administered to the operative eye.     The patient was then to the operative suite and placed in the supine position.  A timeout was performed confirming the patient, procedure to be performed, and all other relevant information.   [name] face was prepped and draped in the usual fashion for intra-ocular surgery.  A lid speculum was placed into the operative eye and the surgical microscope moved into place and focused.  An inferotemporal paracentesis was created using a 20 gauge paracentesis blade.  Shugarcaine was injected into the anterior chamber.  Viscoelastic was injected into the anterior chamber.  A temporal clear-corneal main wound incision was created using a 2.435mmicrokeratome.  A continuous curvilinear capsulorrhexis was initiated using an irrigating cystitome and completed using capsulorrhexis forceps.  Hydrodissection and hydrodeliniation were performed.  Viscoelastic was injected into the anterior chamber.  A phacoemulsification handpiece and a chopper as a second instrument were used to remove the nucleus and epinucleus. The irrigation/aspiration handpiece was used to remove  any remaining cortical material.   The capsular bag was reinflated with viscoelastic, checked, and found to be intact.  The intraocular lens was inserted into the capsular bag.  The irrigation/aspiration handpiece was used to remove any remaining viscoelastic.  The clear corneal wound and paracentesis wounds were then hydrated and checked with Weck-Cels to be watertight.  The lid-speculum was removed.  The drape was removed.  [name] face was cleaned with a wet and dry 4x4.   Maxitrol was instilled in the eye before a clear shield was taped over the eye. The patient was taken to the post-operative care unit in good condition, having tolerated the procedure well.  Post-Op Instructions: The patient will [facility] same day post-operative evaluation and will receive all other orders and instructions.

## 2019-11-24 NOTE — Anesthesia Preprocedure Evaluation (Signed)
Anesthesia Evaluation  Patient identified by MRN, date of birth, ID band Patient awake    Reviewed: Allergy & Precautions, NPO status , Patient's Chart, lab work & pertinent test results, reviewed documented beta blocker date and time   History of Anesthesia Complications (+) PONV and history of anesthetic complications  Airway Mallampati: II  TM Distance: >3 FB Neck ROM: Full    Dental  (+) Edentulous Upper, Edentulous Lower   Pulmonary former smoker,    Pulmonary exam normal breath sounds clear to auscultation       Cardiovascular Exercise Tolerance: Poor hypertension, Pt. on medications and Pt. on home beta blockers  Rhythm:Regular Rate:Normal     Neuro/Psych  Headaches, PSYCHIATRIC DISORDERS Anxiety Depression CVA, Residual Symptoms    GI/Hepatic PUD, GERD  Medicated and Controlled,  Endo/Other  diabetes, Well Controlled, Type 2Hypothyroidism   Renal/GU      Musculoskeletal  (+) Arthritis , Osteoarthritis,    Abdominal   Peds  Hematology  (+) anemia ,   Anesthesia Other Findings   Reproductive/Obstetrics                            Anesthesia Physical Anesthesia Plan  ASA: III  Anesthesia Plan: MAC   Post-op Pain Management:    Induction:   PONV Risk Score and Plan: 2 and Treatment may vary due to age or medical condition  Airway Management Planned: Nasal Cannula and Natural Airway  Additional Equipment:   Intra-op Plan:   Post-operative Plan:   Informed Consent: I have reviewed the patients History and Physical, chart, labs and discussed the procedure including the risks, benefits and alternatives for the proposed anesthesia with the patient or authorized representative who has indicated his/her understanding and acceptance.     Dental advisory given  Plan Discussed with: CRNA and Surgeon  Anesthesia Plan Comments:         Anesthesia Quick Evaluation  

## 2019-11-24 NOTE — Anesthesia Postprocedure Evaluation (Signed)
Anesthesia Post Note  Patient: Megan [name]  Procedure(s) Performed: CATARACT EXTRACTION PHACO AND INTRAOCULAR LENS PLACEMENT (IOC) CDE: 7.95 (Left Eye)  Patient location during evaluation: PACU Anesthesia Type: MAC Level of consciousness: awake and alert and oriented Pain management: pain level controlled Vital Signs Assessment: post-procedure vital signs reviewed and stable Respiratory status: spontaneous breathing Cardiovascular status: blood pressure returned to baseline and stable Postop Assessment: no apparent nausea or vomiting Anesthetic complications: no     Last Vitals:  Vitals:   11/24/19 0737  BP: (!) 150/64  Pulse: (!) 52  Resp: 19  Temp: 36.9 C  SpO2: 96%    Last Pain:  Vitals:   11/24/19 0737  TempSrc: Oral  PainSc: 0-No pain                 Bernardo Brayman

## 2019-11-24 NOTE — Interval H&P Note (Signed)
History and Physical Interval Note: The H and P was reviewed and updated. The patient was examined.  No changes were found after exam.  The surgical eye was marked.  11/24/2019 8:17 AM  Megan [name]  has presented today for surgery, with the diagnosis of Nuclear sclerotic cataract - Left eye.  The various methods of treatment have been discussed with the patient and family. After consideration of risks, benefits and other options for treatment, the patient has consented to  Procedure(s): CATARACT EXTRACTION PHACO AND INTRAOCULAR LENS PLACEMENT (IOC) (Left) as a surgical intervention.  [name] history has been reviewed, patient examined, no change in status, stable for surgery.  I have reviewed the patient's chart and labs.  Questions were answered to the patient's satisfaction.     [name]

## 2019-11-24 NOTE — Transfer of Care (Signed)
Immediate Anesthesia Transfer of Care Note  Patient: [name]  Procedure(s) Performed: CATARACT EXTRACTION PHACO AND INTRAOCULAR LENS PLACEMENT (IOC) CDE: 7.95 (Left Eye)  Patient Location: Short Stay  Anesthesia Type:MAC  Level of Consciousness: awake  Airway & Oxygen Therapy: Patient Spontanous Breathing  Post-op Assessment: Report given to RN  Post vital signs: Reviewed  Last Vitals:  Vitals Value Taken Time  BP    Temp    Pulse    Resp    SpO2      Last Pain:  Vitals:   11/24/19 0737  TempSrc: Oral  PainSc: 0-No pain      Patients Stated Pain Goal: 5 (92/44/62 [id number])  Complications: No apparent anesthesia complications

## 2020-05-17 ENCOUNTER — Encounter: Payer: Self-pay | Admitting: Internal Medicine

## 2020-07-13 ENCOUNTER — Encounter: Payer: Self-pay | Admitting: Internal Medicine

## 2020-07-13 ENCOUNTER — Ambulatory Visit: Payer: Medicare Other | Admitting: Internal Medicine

## 2020-08-17 ENCOUNTER — Encounter: Payer: Self-pay | Admitting: Internal Medicine

## 2020-08-17 ENCOUNTER — Other Ambulatory Visit: Payer: Self-pay

## 2020-08-17 ENCOUNTER — Ambulatory Visit (INDEPENDENT_AMBULATORY_CARE_PROVIDER_SITE_OTHER): Payer: Medicare Other | Admitting: Internal Medicine

## 2020-08-17 VITALS — BP 141/80 | HR 85 | Temp 97.0°F | Ht 62.0 in | Wt 213.6 lb

## 2020-08-17 DIAGNOSIS — K219 Gastro-esophageal reflux disease without esophagitis: Secondary | ICD-10-CM | POA: Diagnosis not present

## 2020-08-17 NOTE — Progress Notes (Signed)
Primary Care Physician:  [name], MD Primary Gastroenterologist:  Dr. Jena Gauss  Pre-Procedure History & Physical: HPI:  Megan [name] is a 70 y.o. female here for 1 year follow-up of GERD.  Markedly positive family history of colon cancer; due for high risk screening this year.  Since last year, patient has done well from a GI standpoint.   GERD well-controlled on Protonix 40 mg daily.  No odynophagia, dysphagia, abdominal pain, nausea or vomiting.  No melena or rectal bleeding.  Unfortunately, fell and fractured her tailbone and suffered some vertebral fractures in October.  Slow to recover.  She would like to put off colonoscopy for now.  In addition, she tells me she took some of her friend's Adderall -  was able to get about the house very well and do all of her chores.  She has asked me for a prescription for Adderall.  Past Medical History:  Diagnosis Date  . Agoraphobia with panic disorder   . Anxiety   . Arterial hypotension 12/20/2017  . Arthritis   . Chronic pain 03/08/15   Hydrocodone-APAP 10/325 mg, # 120 q month  . Colon polyps   . Complication of anesthesia   . Depression   . DM (diabetes mellitus) (HCC)   . GERD (gastroesophageal reflux disease)   . HTN (hypertension)   . Hypothyroidism   . Obesity   . PAF (paroxysmal atrial fibrillation) (HCC)   . PONV (postoperative nausea and vomiting)   . Stroke (HCC) 08/20/2014   some memory loss.    Past Surgical History:  Procedure Laterality Date  . ABDOMINAL HYSTERECTOMY  15 yrs ago   with bso at aph  . CARDIAC CATHETERIZATION  2004   normal  . CATARACT EXTRACTION W/PHACO Right 11/10/2019   Procedure: CATARACT EXTRACTION PHACO AND INTRAOCULAR LENS PLACEMENT RIGHT EYE;  Surgeon: [name], MD;  Location: [facility];  Service: Ophthalmology;  Laterality: Right;  CDE: 46.63  . CATARACT EXTRACTION W/PHACO Left 11/24/2019   Procedure: CATARACT EXTRACTION PHACO AND INTRAOCULAR LENS PLACEMENT (IOC) CDE: 7.95;   Surgeon: [name], MD;  Location: [facility];  Service: Ophthalmology;  Laterality: Left;  . CHOLECYSTECTOMY  25 yrs ago   [facility]  . COLONOSCOPY  05/04/11   left-sided colonic diverticulosis  . COLONOSCOPY N/A 01/27/2016   diverticulosis in sigmoid colon and descending colon, active bleeding Dieulafoy lesion s/p clips X 3, next colonoscopy in 5 years due to family history of colon cancer    . ESOPHAGOGASTRODUODENOSCOPY  05/04/2011   Abnormal distal esophagus, biopsies consistent with inflammation due to acid reflux. No evidence of Barrett's. No H. pylori  . ESOPHAGOGASTRODUODENOSCOPY N/A 01/22/2016   erosive gastropathy, non-bleeding gastric ulcers and 1 oozing gastric ulcer with adherent clot s/p heater probe. Negative H.pylori serology.   . ESOPHAGOGASTRODUODENOSCOPY (EGD) WITH PROPOFOL N/A 04/27/2016   Dr. Jena Gauss: healed ulcers. normal esophagus  . RADIOLOGY WITH ANESTHESIA N/A 08/20/2015   Procedure: RADIOLOGY WITH ANESTHESIA;  Surgeon: Julieanne Cotton, MD;  Location: [facility];  Service: Radiology;  Laterality: N/A;    Prior to Admission medications   Medication Sig Start Date End Date Taking? Authorizing Provider  acetaminophen (TYLENOL) 325 MG tablet Take 2 tablets (650 mg total) by mouth every 6 (six) hours as needed for mild pain, moderate pain or headache. 11/19/17  Yes Johnson, Clanford L, MD  apixaban (ELIQUIS) 5 MG TABS tablet Take 1 tablet (5 mg total) by mouth 2 (two) times daily. Restart on 7/14 Patient taking differently: Take  5 mg by mouth daily. 01/27/16  Yes Erick Blinks, MD  atorvastatin (LIPITOR) 80 MG tablet Take 80 mg by mouth daily.   Yes [provider]  Cholecalciferol (VITAMIN D-3 PO) Take 2,000 Units by mouth daily.    Yes [provider]  clonazePAM (KLONOPIN) 1 MG tablet Take 1 mg by mouth 2 (two) times daily as needed for anxiety.   Yes [provider]  Cyanocobalamin (B-12 PO) Take by mouth daily.   Yes [provider]  enalapril  (VASOTEC) 20 MG tablet Take 20 mg by mouth daily.   Yes [provider]  escitalopram (LEXAPRO) 10 MG tablet Take 1 tablet (10 mg total) by mouth daily. 11/19/17  Yes Johnson, Clanford L, MD  glipiZIDE (GLUCOTROL) 5 MG tablet Take 5 mg by mouth every morning. 07/15/20  Yes [provider]  hydrochlorothiazide (HYDRODIURIL) 25 MG tablet Take 25 mg by mouth daily.   Yes [provider]  levothyroxine (SYNTHROID, LEVOTHROID) 25 MCG tablet Take 25 mcg by mouth daily. 07/14/15  Yes [provider]  metFORMIN (GLUCOPHAGE-XR) 500 MG 24 hr tablet Take 1,000 mg by mouth 2 (two) times daily.   Yes [provider]  metoprolol succinate (TOPROL-XL) 50 MG 24 hr tablet Take 1 tablet (50 mg total) by mouth daily with lunch. 12/22/17  Yes Johnson, Clanford L, MD  pantoprazole (PROTONIX) 40 MG tablet Take 40 mg by mouth daily.   Yes [provider]  pioglitazone (ACTOS) 15 MG tablet Take 15 mg by mouth daily.   Yes [provider]  predniSONE (DELTASONE) 5 MG tablet Take 5 mg by mouth daily.   Yes [provider]    Allergies as of 08/17/2020 - Review Complete 08/17/2020  Allergen Reaction Noted  . Naproxen Other (See Comments) 04/05/2011    Family History  Problem Relation Age of Onset  . Colon cancer Paternal Aunt        2, colon cancer  . Colon cancer Paternal Uncle        6, colon cancer  . Colon cancer Father        deceased, age 56  . Colon cancer Other        numerous cousins died before age 26 with colon cancer  . Colon cancer Sister        deceased, age 19  . Anesthesia problems Neg Hx   . Hypotension Neg Hx   . Malignant hyperthermia Neg Hx   . Pseudochol deficiency Neg Hx     Social History   Socioeconomic History  . Marital status: Married    Spouse name: Not on file  . Number of children: 3  . Years of education: Not on file  . Highest education level: Not on file  Occupational History  . Occupation:  disability    Employer: NOT EMPLOYED  Tobacco Use  . Smoking status: Former Smoker    Packs/day: 0.25    Years: 15.00    Pack years: 3.75    Types: Cigarettes    Start date: 07/17/1969    Quit date: 07/18/1971    Years since quitting: 49.1  . Smokeless tobacco: Former Neurosurgeon    Types: Snuff    Quit date: 08/20/2014  . Tobacco comment: never more than 1-2 cig/day in entire life  Vaping Use  . Vaping Use: Never used  Substance and Sexual Activity  . Alcohol use: No    Alcohol/week: 0.0 standard drinks  . Drug use: No  . Sexual  activity: Yes    Birth control/protection: Surgical  Other Topics Concern  . Not on file  Social History Narrative   No contact with children since they left home at age 41. Doesn't know where they are.   Social Determinants of Health   Financial Resource Strain: Not on file  Food Insecurity: Not on file  Transportation Needs: Not on file  Physical Activity: Not on file  Stress: Not on file  Social Connections: Not on file  Intimate Partner Violence: Not on file    Review of Systems: See HPI, otherwise negative ROS  Physical Exam: BP (!) 141/80   Pulse 85   Temp (!) 97 F (36.1 C)   Ht 5\' 2"  (1.575 m)   Wt 213 lb 9.6 oz (96.9 kg)   BMI 39.07 kg/m  General:   Alert, somewhat frail but  well-nourished, pleasant and cooperative in NAD Neck:  Supple; no masses or thyromegaly. No significant cervical adenopathy. Lungs:  Clear throughout to auscultation.   No wheezes, crackles, or rhonchi. No acute distress. Heart:  Regular rate and rhythm; no murmurs, clicks, rubs,  or gallops. Abdomen: Non-distended, normal bowel sounds.  Soft and nontender without appreciable mass or hepatosplenomegaly.  Pulses:  Normal pulses noted. Extremities:  Without clubbing or edema.  Impression/Plan: 70 year old lady with GERD well-controlled on Protonix 40 mg daily.  Positive family history of colon cancer.  Due for surveillance colonoscopy this year.  She is still getting  over her lumbosacral fractures.  Clinically, no alarm symptoms.  Recommendations:  Continue Protonix 40 mg daily  GERD information  Office visit in 1 year to reassess and set up a screening colonoscopy.  I declined to prescribe Adderall.       Notice: This dictation was prepared with Dragon dictation along with smaller phrase technology. Any transcriptional errors that result from this process are unintentional and may not be corrected upon review.

## 2020-08-17 NOTE — Patient Instructions (Signed)
Continue Protonix 40 mg daily  GERD information  Office visit in 1 year  We will see you in 1 year to set up a colonoscopy

## 2020-10-06 ENCOUNTER — Other Ambulatory Visit (HOSPITAL_COMMUNITY): Payer: Self-pay | Admitting: Nurse Practitioner

## 2020-10-06 DIAGNOSIS — M545 Low back pain, unspecified: Secondary | ICD-10-CM

## 2020-10-08 ENCOUNTER — Ambulatory Visit (HOSPITAL_COMMUNITY)
Admission: RE | Admit: 2020-10-08 | Discharge: 2020-10-08 | Disposition: A | Discharge status: Home / Self Care | Payer: Medicare Other | Source: Ambulatory Visit | Attending: Nurse Practitioner | Admitting: Nurse Practitioner

## 2020-10-08 ENCOUNTER — Other Ambulatory Visit: Payer: Self-pay

## 2020-10-08 DIAGNOSIS — M545 Low back pain, unspecified: Secondary | ICD-10-CM | POA: Diagnosis present

## 2020-10-19 ENCOUNTER — Encounter: Payer: Self-pay | Admitting: General Surgery

## 2020-10-19 ENCOUNTER — Ambulatory Visit (INDEPENDENT_AMBULATORY_CARE_PROVIDER_SITE_OTHER): Payer: Medicare Other | Admitting: General Surgery

## 2020-10-19 ENCOUNTER — Other Ambulatory Visit: Payer: Self-pay

## 2020-10-19 VITALS — BP 122/64 | HR 92 | Temp 97.8°F | Resp 16 | Ht 62.0 in | Wt 212.0 lb

## 2020-10-19 DIAGNOSIS — K432 Incisional hernia without obstruction or gangrene: Secondary | ICD-10-CM | POA: Diagnosis not present

## 2020-10-19 NOTE — Progress Notes (Signed)
Subjective:     Megan [name]  Patient is a 70 year old white female who presented back to my care to have her hernia checked.  She states she is having more abdominal wall pain over the past few months after she fractured her lower back late last year.  She denies any nausea or vomiting.  She is now walking with a cane.  She wanted her hernia evaluated as she had heard that it could bust open. Objective:    BP 122/64   Pulse 92   Temp 97.8 F (36.6 C) (Other (Comment))   Resp 16   Ht 5\' 2"  (1.575 m)   Wt 212 lb (96.2 kg)   SpO2 97%   BMI 38.78 kg/m   General:  alert, cooperative, appears stated age and no distress  Head is normocephalic, atraumatic Lungs are clear to auscultation with equal breath sounds bilaterally Heart examination reveals a regular rate and rhythm without S3, S4, murmurs Her abdomen is rotund with evidence of an indistinct umbilical hernia present, though she also has a diastases recti and it makes feeling the actual edges of the hernia difficult.  Her overall exam is difficult secondary to her body habitus.  She is nontender.     Assessment:    Patient does have a large incisional hernia with a large diastases recti.  Her abdominal wall pain is most likely secondary to her back issues causing her to have an abnormal gait.  She does need surgical intervention as the risk of incarceration is very low.  I also told her that she was at high risk for any surgical intervention given her multiple comorbidities and need for chronic anticoagulation.  I am afraid that any abdominal surgery would aggravate her overall ability to ambulate due to her back issues.    Plan:   Literature was given.  She understands and agrees with with me.  I told her to return to my care should she have any questions.  Follow-up as needed.

## 2020-10-19 NOTE — Patient Instructions (Signed)

## 2021-06-01 ENCOUNTER — Other Ambulatory Visit: Payer: Self-pay

## 2021-06-01 ENCOUNTER — Ambulatory Visit (INDEPENDENT_AMBULATORY_CARE_PROVIDER_SITE_OTHER): Payer: Medicare Other

## 2021-06-01 ENCOUNTER — Ambulatory Visit
Admission: EM | Admit: 2021-06-01 | Discharge: 2021-06-01 | Disposition: A | Discharge status: Home / Self Care | Payer: Medicare Other | Attending: Family Medicine | Admitting: Family Medicine

## 2021-06-01 DIAGNOSIS — S20211A Contusion of right front wall of thorax, initial encounter: Secondary | ICD-10-CM

## 2021-06-01 DIAGNOSIS — R0781 Pleurodynia: Secondary | ICD-10-CM | POA: Diagnosis not present

## 2021-06-01 DIAGNOSIS — R0782 Intercostal pain: Secondary | ICD-10-CM

## 2021-06-01 MED ORDER — TRAMADOL HCL 50 MG PO TABS: 50.0000 mg | ORAL_TABLET | Freq: Four times a day (QID) | ORAL | 0 refills | Status: DC | PRN

## 2021-06-01 NOTE — ED Triage Notes (Signed)
Pt reports she fell over a side table when trying to stand up from the bed. Pt states she is having pain right sided rib pain, worse when coughing.

## 2021-06-01 NOTE — Discharge Instructions (Addendum)
I did not see any broken ribs on your x-rays today.  Be aware, you have been prescribed pain medications that may cause drowsiness. Do not combine with alcohol or other illicit drugs. Please do not drive, operate heavy machinery, or take part in activities that require making important decisions while on this medication as your judgement may be clouded.

## 2021-06-01 NOTE — ED Provider Notes (Signed)
[facility]   [id number] 06/01/21 Arrival Time: 1202  ASSESSMENT & PLAN:  1. Rib pain on right side   2. Rib contusion, right, initial encounter    I have personally viewed the imaging studies ordered this visit. No rib fx or pneumothorax appreciated.  Discussed need for occas deep breaths. As needed: Meds ordered this encounter  Medications   traMADol (ULTRAM) 50 MG tablet    Sig: Take 1 tablet (50 mg total) by mouth every 6 (six) hours as needed.    Dispense:  20 tablet    Refill:  0   See AVS for d/c information.  Reviewed expectations re: course of current medical issues. Questions answered. Outlined signs and symptoms indicating need for more acute intervention. Patient verbalized understanding. After Visit Summary given.   SUBJECTIVE:  History from: patient. Megan [name] is a 70 y.o. female who presents with complaint of R-sided rib pain; x 48 hours; s/p fall into table; no SOB; occas cough. Tylenol without much relief. Pain does affect sleep. No head injury. Non abd pain. No extremity sensation changes or weakness.  Normal bowel/bladder habits.  Social History   Tobacco Use  Smoking Status Former   Packs/day: 0.25   Years: 15.00   Pack years: 3.75   Types: Cigarettes   Start date: 07/17/1969   Quit date: 07/18/1971   Years since quitting: 49.9  Smokeless Tobacco Former   Types: Snuff   Quit date: 08/20/2014  Tobacco Comments   never more than 1-2 cig/day in entire life   Social History   Substance and Sexual Activity  Alcohol Use No   Alcohol/week: 0.0 standard drinks     OBJECTIVE:  Vitals:   06/01/21 1351  BP: (!) 103/50  Pulse: (!) 55  Resp: 20  Temp: 97.6 F (36.4 C)  TempSrc: Oral  SpO2: 96%    General appearance: alert, oriented, no acute distress Eyes: PERRLA; EOMI; conjunctivae normal HENT: normocephalic; atraumatic Neck: supple with FROM Lungs: without labored respirations; speaks full sentences without  difficulty; CTAB Chest Wall: with tenderness to palpation over lower R ribs Abdomen: soft, non-tender; no guarding or rebound  tenderness Skin: warm and dry; without rash or lesions; no open wounds Neuro: normal gait Psychological: alert and cooperative; normal mood and affect   Imaging: DG Ribs Unilateral W/Chest Right  Result Date: 06/01/2021 CLINICAL DATA:  Trauma.  Pain. EXAM: RIGHT RIBS AND CHEST - 3+ VIEW COMPARISON:  12/20/2017 FINDINGS: No rib fracture or rib lesion. Cardiac silhouette normal in size.  No mediastinal or hilar masses. Minor linear opacities at the lung bases consistent with atelectasis or scarring. Lungs otherwise clear. No convincing pleural effusion or pneumothorax. IMPRESSION: 1. No rib fracture or rib lesion. 2. No acute cardiopulmonary disease. Electronically Signed   By: Amie Portland M.D.   On: 06/01/2021 14:23     Allergies  Allergen Reactions   Naproxen Other (See Comments)    Causes stomach burning    Past Medical History:  Diagnosis Date   Agoraphobia with panic disorder    Anxiety    Arterial hypotension 12/20/2017   Arthritis    Chronic pain 03/08/15   Hydrocodone-APAP 10/325 mg, # 120 q month   Colon polyps    Complication of anesthesia    Depression    DM (diabetes mellitus) (HCC)    GERD (gastroesophageal reflux disease)    HTN (hypertension)    Hypothyroidism    Obesity    PAF (paroxysmal atrial fibrillation) (HCC)  PONV (postoperative nausea and vomiting)    Stroke (HCC) 08/20/2014   some memory loss.   Social History   Socioeconomic History   Marital status: Married    Spouse name: Not on file   Number of children: 3   Years of education: Not on file   Highest education level: Not on file  Occupational History   Occupation: disability    Employer: NOT EMPLOYED  Tobacco Use   Smoking status: Former    Packs/day: 0.25    Years: 15.00    Pack years: 3.75    Types: Cigarettes    Start date: 07/17/1969    Quit date:  07/18/1971    Years since quitting: 49.9   Smokeless tobacco: Former    Types: Snuff    Quit date: 08/20/2014   Tobacco comments:    never more than 1-2 cig/day in entire life  Vaping Use   Vaping Use: Never used  Substance and Sexual Activity   Alcohol use: No    Alcohol/week: 0.0 standard drinks   Drug use: No   Sexual activity: Yes    Birth control/protection: Surgical  Other Topics Concern   Not on file  Social History Narrative   No contact with children since they left home at age 67. Doesn't know where they are.   Social Determinants of Health   Financial Resource Strain: Not on file  Food Insecurity: Not on file  Transportation Needs: Not on file  Physical Activity: Not on file  Stress: Not on file  Social Connections: Not on file  Intimate Partner Violence: Not on file   Family History  Problem Relation Age of Onset   Colon cancer Paternal Aunt        2, colon cancer   Colon cancer Paternal Uncle        6, colon cancer   Colon cancer Father        deceased, age 11   Colon cancer Other        numerous cousins died before age 3 with colon cancer   Colon cancer Sister        deceased, age 11   Anesthesia problems Neg Hx    Hypotension Neg Hx    Malignant hyperthermia Neg Hx    Pseudochol deficiency Neg Hx    Past Surgical History:  Procedure Laterality Date   ABDOMINAL HYSTERECTOMY  15 yrs ago   with bso at aph   CARDIAC CATHETERIZATION  2004   normal   CATARACT EXTRACTION W/PHACO Right 11/10/2019   Procedure: CATARACT EXTRACTION PHACO AND INTRAOCULAR LENS PLACEMENT RIGHT EYE;  Surgeon: [name], MD;  Location: [facility];  Service: Ophthalmology;  Laterality: Right;  CDE: 46.63   CATARACT EXTRACTION W/PHACO Left 11/24/2019   Procedure: CATARACT EXTRACTION PHACO AND INTRAOCULAR LENS PLACEMENT (IOC) CDE: 7.95;  Surgeon: [name], MD;  Location: [facility];  Service: Ophthalmology;  Laterality: Left;   CHOLECYSTECTOMY  25 yrs ago   [facility]   COLONOSCOPY   05/04/11   left-sided colonic diverticulosis   COLONOSCOPY N/A 01/27/2016   diverticulosis in sigmoid colon and descending colon, active bleeding Dieulafoy lesion s/p clips X 3, next colonoscopy in 5 years due to family history of colon cancer     ESOPHAGOGASTRODUODENOSCOPY  05/04/2011   Abnormal distal esophagus, biopsies consistent with inflammation due to acid reflux. No evidence of Barrett's. No H. pylori   ESOPHAGOGASTRODUODENOSCOPY N/A 01/22/2016   erosive gastropathy, non-bleeding gastric ulcers and 1 oozing gastric ulcer with adherent  clot s/p heater probe. Negative H.pylori serology.    ESOPHAGOGASTRODUODENOSCOPY (EGD) WITH PROPOFOL N/A 04/27/2016   Dr. Jena Gauss: healed ulcers. normal esophagus   RADIOLOGY WITH ANESTHESIA N/A 08/20/2015   Procedure: RADIOLOGY WITH ANESTHESIA;  Surgeon: Julieanne Cotton, MD;  Location: [facility];  Service: Radiology;  Laterality: N/AMardella Layman, MD 06/01/21 [phone]

## 2021-08-25 ENCOUNTER — Emergency Department (HOSPITAL_COMMUNITY): Payer: Medicare Other

## 2021-08-25 ENCOUNTER — Other Ambulatory Visit: Payer: Self-pay

## 2021-08-25 ENCOUNTER — Encounter (HOSPITAL_COMMUNITY): Payer: Self-pay | Admitting: *Deleted

## 2021-08-25 ENCOUNTER — Emergency Department (HOSPITAL_COMMUNITY)
Admission: EM | Admit: 2021-08-25 | Discharge: 2021-08-26 | Disposition: A | Discharge status: Home / Self Care | Payer: Medicare Other | Attending: Emergency Medicine | Admitting: Emergency Medicine

## 2021-08-25 DIAGNOSIS — I10 Essential (primary) hypertension: Secondary | ICD-10-CM | POA: Diagnosis not present

## 2021-08-25 DIAGNOSIS — Z87891 Personal history of nicotine dependence: Secondary | ICD-10-CM | POA: Diagnosis not present

## 2021-08-25 DIAGNOSIS — M79671 Pain in right foot: Secondary | ICD-10-CM | POA: Diagnosis not present

## 2021-08-25 DIAGNOSIS — R42 Dizziness and giddiness: Secondary | ICD-10-CM | POA: Insufficient documentation

## 2021-08-25 DIAGNOSIS — E039 Hypothyroidism, unspecified: Secondary | ICD-10-CM | POA: Diagnosis not present

## 2021-08-25 DIAGNOSIS — E119 Type 2 diabetes mellitus without complications: Secondary | ICD-10-CM | POA: Insufficient documentation

## 2021-08-25 DIAGNOSIS — S92901A Unspecified fracture of right foot, initial encounter for closed fracture: Secondary | ICD-10-CM | POA: Diagnosis not present

## 2021-08-25 DIAGNOSIS — W182XXA Fall in (into) shower or empty bathtub, initial encounter: Secondary | ICD-10-CM | POA: Diagnosis not present

## 2021-08-25 DIAGNOSIS — Y92002 Bathroom of unspecified non-institutional (private) residence single-family (private) house as the place of occurrence of the external cause: Secondary | ICD-10-CM | POA: Diagnosis not present

## 2021-08-25 DIAGNOSIS — S99921A Unspecified injury of right foot, initial encounter: Secondary | ICD-10-CM | POA: Diagnosis present

## 2021-08-25 NOTE — ED Triage Notes (Signed)
Pt fell and now with c/o right ankle pain.  Larey Seat due to her legs giving out.

## 2021-08-26 DIAGNOSIS — S92901A Unspecified fracture of right foot, initial encounter for closed fracture: Secondary | ICD-10-CM | POA: Diagnosis not present

## 2021-08-26 MED ORDER — OXYCODONE HCL 5 MG PO TABS
5.0000 mg | ORAL_TABLET | Freq: Once | ORAL | Status: AC
  Administered 2021-08-26: 5 mg via ORAL
  Filled 2021-08-26: qty 1

## 2021-08-26 MED ORDER — METOPROLOL SUCCINATE ER 50 MG PO TB24
50.0000 mg | ORAL_TABLET | Freq: Once | ORAL | Status: AC
  Administered 2021-08-26: 50 mg via ORAL
  Filled 2021-08-26: qty 1

## 2021-08-26 MED ORDER — HYDROCODONE-ACETAMINOPHEN 5-325 MG PO TABS: 1.0000 | ORAL_TABLET | ORAL | 0 refills | Status: DC | PRN

## 2021-08-26 NOTE — Discharge Instructions (Addendum)
You were evaluated in the Emergency Department and after careful evaluation, we did not find any emergent condition requiring admission or further testing in the hospital.  You have multiple broken bones in your right foot.  You will need to follow-up with the orthopedic specialists for future management.  Use the Norco tablets as needed for pain.  Please return to the Emergency Department if you experience any worsening of your condition.   Thank you for allowing [place] to be a part of your care.

## 2021-08-26 NOTE — ED Provider Notes (Signed)
[facility] Emergency Department Provider Note MRN:  [id number]  Arrival date & time: 08/26/21     Chief Complaint   Ankle Pain   History of Present Illness   Megan [name] is a 71 y.o. year-old female with a history of diabetes, A-fib presenting to the ED with chief complaint of ankle pain.  Patient was sitting on the couch watching TV and had to use the bathroom and so she stood up quickly to go.  On the way to the bathroom she became lightheaded and this caused her to stumble and fall.  She fell or landed awkwardly on her right foot and is having moderate to severe pain in this area.  Pain radiates up into the ankle.  She denies any other injuries.  Specifically denies head trauma, no loss of consciousness, no neck or back pain, no chest pain or shortness of breath, no abdominal pain.  Review of Systems  A thorough review of systems was obtained and all systems are negative except as noted in the HPI and PMH.   Patient's Health History    Past Medical History:  Diagnosis Date   Agoraphobia with panic disorder    Anxiety    Arterial hypotension 12/20/2017   Arthritis    Chronic pain 03/08/15   Hydrocodone-APAP 10/325 mg, # 120 q month   Colon polyps    Complication of anesthesia    Depression    DM (diabetes mellitus) (HCC)    GERD (gastroesophageal reflux disease)    HTN (hypertension)    Hypothyroidism    Obesity    PAF (paroxysmal atrial fibrillation) (HCC)    PONV (postoperative nausea and vomiting)    Stroke (HCC) 08/20/2014   some memory loss.    Past Surgical History:  Procedure Laterality Date   ABDOMINAL HYSTERECTOMY  15 yrs ago   with bso at aph   CARDIAC CATHETERIZATION  2004   normal   CATARACT EXTRACTION W/PHACO Right 11/10/2019   Procedure: CATARACT EXTRACTION PHACO AND INTRAOCULAR LENS PLACEMENT RIGHT EYE;  Surgeon: [name], MD;  Location: [facility];  Service: Ophthalmology;  Laterality: Right;  CDE: 46.63   CATARACT  EXTRACTION W/PHACO Left 11/24/2019   Procedure: CATARACT EXTRACTION PHACO AND INTRAOCULAR LENS PLACEMENT (IOC) CDE: 7.95;  Surgeon: [name], MD;  Location: [facility];  Service: Ophthalmology;  Laterality: Left;   CHOLECYSTECTOMY  25 yrs ago   [facility]   COLONOSCOPY  05/04/11   left-sided colonic diverticulosis   COLONOSCOPY N/A 01/27/2016   diverticulosis in sigmoid colon and descending colon, active bleeding Dieulafoy lesion s/p clips X 3, next colonoscopy in 5 years due to family history of colon cancer     ESOPHAGOGASTRODUODENOSCOPY  05/04/2011   Abnormal distal esophagus, biopsies consistent with inflammation due to acid reflux. No evidence of Barrett's. No H. pylori   ESOPHAGOGASTRODUODENOSCOPY N/A 01/22/2016   erosive gastropathy, non-bleeding gastric ulcers and 1 oozing gastric ulcer with adherent clot s/p heater probe. Negative H.pylori serology.    ESOPHAGOGASTRODUODENOSCOPY (EGD) WITH PROPOFOL N/A 04/27/2016   Dr. Jena Gauss: healed ulcers. normal esophagus   RADIOLOGY WITH ANESTHESIA N/A 08/20/2015   Procedure: RADIOLOGY WITH ANESTHESIA;  Surgeon: Julieanne Cotton, MD;  Location: [facility];  Service: Radiology;  Laterality: N/A;    Family History  Problem Relation Age of Onset   Colon cancer Paternal Aunt        2, colon cancer   Colon cancer Paternal Uncle        6, colon cancer  Colon cancer Father        deceased, age 16   Colon cancer Other        numerous cousins died before age 27 with colon cancer   Colon cancer Sister        deceased, age 27   Anesthesia problems Neg Hx    Hypotension Neg Hx    Malignant hyperthermia Neg Hx    Pseudochol deficiency Neg Hx     Social History   Socioeconomic History   Marital status: Married    Spouse name: Not on file   Number of children: 3   Years of education: Not on file   Highest education level: Not on file  Occupational History   Occupation: disability    Employer: NOT EMPLOYED  Tobacco Use   Smoking status: Former     Packs/day: 0.25    Years: 15.00    Pack years: 3.75    Types: Cigarettes    Start date: 07/17/1969    Quit date: 07/18/1971    Years since quitting: 50.1   Smokeless tobacco: Former    Types: Snuff    Quit date: 08/20/2014   Tobacco comments:    never more than 1-2 cig/day in entire life  Vaping Use   Vaping Use: Never used  Substance and Sexual Activity   Alcohol use: No    Alcohol/week: 0.0 standard drinks   Drug use: No   Sexual activity: Yes    Birth control/protection: Surgical  Other Topics Concern   Not on file  Social History Narrative   No contact with children since they left home at age 37. Doesn't know where they are.   Social Determinants of Health   Financial Resource Strain: Not on file  Food Insecurity: Not on file  Transportation Needs: Not on file  Physical Activity: Not on file  Stress: Not on file  Social Connections: Not on file  Intimate Partner Violence: Not on file     Physical Exam   Vitals:   08/26/21 0045 08/26/21 0130  BP: 129/64 129/71  Pulse: 84 (!) 59  Resp: 19 16  Temp:    SpO2: 98% 96%    CONSTITUTIONAL: Well-appearing, NAD NEURO/PSYCH:  Alert and oriented x 3, no focal deficits EYES:  eyes equal and reactive ENT/NECK:  no LAD, no JVD CARDIO: Regular rate, well-perfused, normal S1 and S2 PULM:  CTAB no wheezing or rhonchi GI/GU:  non-distended, non-tender MSK/SPINE:  No gross deformities, no edema SKIN: Bruising and edema to the right foot, foot is neurovascularly intact   *Additional and/or pertinent findings included in MDM below  Diagnostic and Interventional Summary    EKG Interpretation  Date/Time:    Ventricular Rate:    PR Interval:    QRS Duration:   QT Interval:    QTC Calculation:   R Axis:     Text Interpretation:         Labs Reviewed - No data to display  CT Foot Right Wo Contrast  Final Result    DG Ankle Complete Right  Final Result    DG Foot Complete Right  Final Result      Medications   metoprolol succinate (TOPROL-XL) 24 hr tablet 50 mg (has no administration in time range)  oxyCODONE (Oxy IR/ROXICODONE) immediate release tablet 5 mg (5 mg Oral Given 08/26/21 0046)     Procedures  /  Critical Care Procedures  ED Course and Medical Decision Making  Initial Impression and Ddx Lightheadedness and  fall likely due to orthostatic hypotension.  Will obtain screening EKG to look for arrhythmia, abnormal intervals.  I see little benefit in laboratory evaluation at this time.  X-rays suspicious for calcaneus fracture, CT scan revealing multiple fractures of the foot.  Will place in boot and refer to orthopedics.  Past medical/surgical history that increases complexity of ED encounter: Anticoagulated  Interpretation of Diagnostics I personally reviewed the EKG and my interpretation is as follows: A-fib with mildly elevated rate    CT imaging revealing multiple comminuted fractures of the right foot  Patient Reassessment and Ultimate Disposition/Management Patient with some continued mild tachycardia, on my last assessment 108.  She explains that she forgot to take her home meds this evening.  Long history of A-fib.  Appropriate for discharge.  Provided home dose of metoprolol.  Patient management required discussion with the following services or consulting groups:  None  Complexity of Problems Addressed Acute complicated illness or Injury  Additional Data Reviewed and Analyzed Further history obtained from: Further history from spouse/family member  Factors Impacting ED Encounter Risk Prescriptions  Elmer Sow. Pilar Plate, MD [facility] Emergency Medicine [facility] [email or web address]  Final Clinical Impressions(s) / ED Diagnoses     ICD-10-CM   1. Closed fracture of right foot, initial encounter  S92.901A       ED Discharge Orders          Ordered    HYDROcodone-acetaminophen (NORCO/VICODIN) 5-325 MG tablet  Every 4 hours PRN        08/26/21 0150              Discharge Instructions Discussed with and Provided to Patient:     Discharge Instructions      You were evaluated in the Emergency Department and after careful evaluation, we did not find any emergent condition requiring admission or further testing in the hospital.  You have multiple broken bones in your right foot.  You will need to follow-up with the orthopedic specialists for future management.  Use the Norco tablets as needed for pain.  Please return to the Emergency Department if you experience any worsening of your condition.   Thank you for allowing [place] to be a part of your care.       Sabas Sous, MD 08/26/21 [phone]

## 2021-08-29 ENCOUNTER — Encounter: Payer: Self-pay | Admitting: Orthopedic Surgery

## 2021-08-29 ENCOUNTER — Ambulatory Visit (INDEPENDENT_AMBULATORY_CARE_PROVIDER_SITE_OTHER): Payer: Medicare Other | Admitting: Orthopedic Surgery

## 2021-08-29 ENCOUNTER — Other Ambulatory Visit: Payer: Self-pay

## 2021-08-29 VITALS — BP 162/84 | HR 138 | Ht 62.0 in

## 2021-08-29 DIAGNOSIS — S92254A Nondisplaced fracture of navicular [scaphoid] of right foot, initial encounter for closed fracture: Secondary | ICD-10-CM

## 2021-08-29 DIAGNOSIS — S92001A Unspecified fracture of right calcaneus, initial encounter for closed fracture: Secondary | ICD-10-CM | POA: Diagnosis not present

## 2021-08-29 DIAGNOSIS — S92314A Nondisplaced fracture of first metatarsal bone, right foot, initial encounter for closed fracture: Secondary | ICD-10-CM

## 2021-08-29 MED ORDER — HYDROCODONE-ACETAMINOPHEN 5-325 MG PO TABS: 1.0000 | ORAL_TABLET | ORAL | 0 refills | Status: DC | PRN

## 2021-08-29 NOTE — Patient Instructions (Signed)
Elevate your foot as much as possible to help with the swelling.   Medications as needed  Follow up in 2 weeks for repeat evaluation  Do not put weight on your foot.

## 2021-08-29 NOTE — Progress Notes (Signed)
New Patient Visit  Assessment: Megan [name] is a 71 y.o. female with the following: Multiple right foot fractures, minimally displaced; nonoperative management  Plan: Patient sustained multiple injuries to her right foot.  CT scan demonstrated fractures, with minimal displacement.  She has diffuse swelling and bruising in her right foot at this time.  We offered her continued use of the cam boot versus placement of a cast.  She elected to proceed with the boot.  I provided her with an updated prescription for pain medications.  I have advised her to elevate her foot as much as possible.  She is to remain nonweightbearing.  We will see her back in 2 weeks for repeat evaluation.   Follow-up: Return in about 2 weeks (around 09/12/2021).  Subjective:  Chief Complaint  Patient presents with   Foot Injury    Right, fractured fell DOI 08/26/21    History of Present Illness: Megan [name] is a 71 y.o. female who presents for evaluation of right foot pain.  Approximate 1 week ago, she was walking at home, she lost her balance, and her right foot "slipped underneath her".  She presented to the emergency department, and was noted to have multiple foot fractures on CT scan.  She was placed in a walking boot.  She has remained nonweightbearing.  She is try to elevate her foot is much as possible.  She is having difficulty getting around.  She is taking hydrocodone for pain.  No other injuries.  She is not having pain elsewhere.   Review of Systems: No fevers or chills No numbness or tingling No chest pain No shortness of breath No bowel or bladder dysfunction No GI distress No headaches   Medical History:  Past Medical History:  Diagnosis Date   Agoraphobia with panic disorder    Anxiety    Arterial hypotension 12/20/2017   Arthritis    Chronic pain 03/08/15   Hydrocodone-APAP 10/325 mg, # 120 q month   Colon polyps    Complication of anesthesia    Depression    DM  (diabetes mellitus) (HCC)    GERD (gastroesophageal reflux disease)    HTN (hypertension)    Hypothyroidism    Obesity    PAF (paroxysmal atrial fibrillation) (HCC)    PONV (postoperative nausea and vomiting)    Stroke (HCC) 08/20/2014   some memory loss.    Past Surgical History:  Procedure Laterality Date   ABDOMINAL HYSTERECTOMY  15 yrs ago   with bso at [facility]   CARDIAC CATHETERIZATION  2004   normal   CATARACT EXTRACTION W/PHACO Right 11/10/2019   Procedure: CATARACT EXTRACTION PHACO AND INTRAOCULAR LENS PLACEMENT RIGHT EYE;  Surgeon: Fabio PierceWrzosek, James, MD;  Location: [facility];  Service: Ophthalmology;  Laterality: Right;  CDE: 46.63   CATARACT EXTRACTION W/PHACO Left 11/24/2019   Procedure: CATARACT EXTRACTION PHACO AND INTRAOCULAR LENS PLACEMENT (IOC) CDE: 7.95;  Surgeon: Fabio PierceWrzosek, James, MD;  Location: [facility];  Service: Ophthalmology;  Laterality: Left;   CHOLECYSTECTOMY  25 yrs ago   [facility]   COLONOSCOPY  05/04/11   left-sided colonic diverticulosis   COLONOSCOPY N/A 01/27/2016   diverticulosis in sigmoid colon and descending colon, active bleeding Dieulafoy lesion s/p clips X 3, next colonoscopy in 5 years due to family history of colon cancer     ESOPHAGOGASTRODUODENOSCOPY  05/04/2011   Abnormal distal esophagus, biopsies consistent with inflammation due to acid reflux. No evidence of Barrett's. No H. pylori   ESOPHAGOGASTRODUODENOSCOPY N/A 01/22/2016  erosive gastropathy, non-bleeding gastric ulcers and 1 oozing gastric ulcer with adherent clot s/p heater probe. Negative H.pylori serology.    ESOPHAGOGASTRODUODENOSCOPY (EGD) WITH PROPOFOL N/A 04/27/2016   Dr. Gala Romney: healed ulcers. normal esophagus   RADIOLOGY WITH ANESTHESIA N/A 08/20/2015   Procedure: RADIOLOGY WITH ANESTHESIA;  Surgeon: Luanne Bras, MD;  Location: [place] [facility];  Service: Radiology;  Laterality: N/A;    Family History  Problem Relation Age of Onset   Colon cancer Paternal 40        2, colon cancer   Colon cancer  Paternal Uncle        14, colon cancer   Colon cancer Father        deceased, age 27   Colon cancer Other        numerous cousins died before age 21 with colon cancer   Colon cancer Sister        deceased, age 29   Anesthesia problems Neg Hx    Hypotension Neg Hx    Malignant hyperthermia Neg Hx    Pseudochol deficiency Neg Hx    Social History   Tobacco Use   Smoking status: Former    Packs/day: 0.25    Years: 15.00    Pack years: 3.75    Types: Cigarettes    Start date: 07/17/1969    Quit date: 07/18/1971    Years since quitting: 50.1   Smokeless tobacco: Former    Types: Snuff    Quit date: 08/20/2014   Tobacco comments:    never more than 1-2 cig/day in entire life  Vaping Use   Vaping Use: Never used  Substance Use Topics   Alcohol use: No    Alcohol/week: 0.0 standard drinks   Drug use: No    Allergies  Allergen Reactions   Naproxen Other (See Comments)    Causes stomach burning    Current Meds  Medication Sig   acetaminophen (TYLENOL) 325 MG tablet Take 2 tablets (650 mg total) by mouth every 6 (six) hours as needed for mild pain, moderate pain or headache.   apixaban (ELIQUIS) 5 MG TABS tablet Take 1 tablet (5 mg total) by mouth 2 (two) times daily. Restart on 7/14 (Patient taking differently: Take 5 mg by mouth daily.)   atorvastatin (LIPITOR) 80 MG tablet Take 80 mg by mouth daily.   Cholecalciferol (VITAMIN D-3 PO) Take 2,000 Units by mouth daily.    clonazePAM (KLONOPIN) 1 MG tablet Take 1 mg by mouth 2 (two) times daily as needed for anxiety.   Cyanocobalamin (B-12 PO) Take by mouth daily.   enalapril (VASOTEC) 20 MG tablet Take 20 mg by mouth daily.   escitalopram (LEXAPRO) 10 MG tablet Take 1 tablet (10 mg total) by mouth daily.   glipiZIDE (GLUCOTROL) 5 MG tablet Take 5 mg by mouth every morning.   hydrochlorothiazide (HYDRODIURIL) 25 MG tablet Take 25 mg by mouth daily.   HYDROcodone-acetaminophen (NORCO/VICODIN) 5-325 MG tablet Take 1 tablet by mouth  every 4 (four) hours as needed.   HYDROcodone-acetaminophen (NORCO/VICODIN) 5-325 MG tablet Take 1 tablet by mouth every 4 (four) hours as needed for moderate pain.   levothyroxine (SYNTHROID, LEVOTHROID) 25 MCG tablet Take 25 mcg by mouth daily.   metFORMIN (GLUCOPHAGE-XR) 500 MG 24 hr tablet Take 1,000 mg by mouth 2 (two) times daily.   metoprolol succinate (TOPROL-XL) 50 MG 24 hr tablet Take 1 tablet (50 mg total) by mouth daily with lunch.   pantoprazole (PROTONIX) 40 MG tablet Take 40  mg by mouth daily.   pioglitazone (ACTOS) 15 MG tablet Take 15 mg by mouth daily.   predniSONE (DELTASONE) 5 MG tablet Take 5 mg by mouth daily.   traMADol (ULTRAM) 50 MG tablet Take 1 tablet (50 mg total) by mouth every 6 (six) hours as needed.    Objective: BP (!) 162/84    Pulse (!) 138    Ht 5\' 2"  (1.575 m)    BMI 38.78 kg/m   Physical Exam:  General: Alert and oriented. and No acute distress.  Seated in a wheelchair. Gait: Unable to ambulate.  Evaluation of the right foot demonstrates diffuse swelling.  She has a diffuse ecchymosis.  Active motion intact in the TA/EHL.  She has sensation over the dorsum of her foot.  She does have some tenderness about the ankle, as well as the dorsum of her foot.  There is some plantar ecchymosis as well.  Toes are warm and well-perfused.   IMAGING: I personally reviewed images previously obtained from the ED  CT scan right foot  Multiple, mostly nondisplaced fractures and avulsion fragments demonstrated as described above and involving the calcaneus, talus, navicular bone, medial cuneiform, and first, second, and third metatarsal bones. Changes likely indicate interosseous ligament damage. MRI may be useful in further evaluation depending on planned treatment. Diffuse edema throughout the foot and ankle.     New Medications:  Meds ordered this encounter  Medications   HYDROcodone-acetaminophen (NORCO/VICODIN) 5-325 MG tablet    Sig: Take 1 tablet by  mouth every 4 (four) hours as needed for moderate pain.    Dispense:  20 tablet    Refill:  0      Mordecai Rasmussen, MD  08/29/2021 2:10 PM

## 2021-09-02 ENCOUNTER — Encounter: Payer: Medicare Other | Admitting: Orthopedic Surgery

## 2021-09-07 ENCOUNTER — Ambulatory Visit: Payer: Medicare Other

## 2021-09-07 ENCOUNTER — Other Ambulatory Visit: Payer: Self-pay

## 2021-09-07 ENCOUNTER — Ambulatory Visit (INDEPENDENT_AMBULATORY_CARE_PROVIDER_SITE_OTHER): Payer: Medicare Other | Admitting: Orthopedic Surgery

## 2021-09-07 ENCOUNTER — Encounter: Payer: Self-pay | Admitting: Orthopedic Surgery

## 2021-09-07 DIAGNOSIS — S92254D Nondisplaced fracture of navicular [scaphoid] of right foot, subsequent encounter for fracture with routine healing: Secondary | ICD-10-CM

## 2021-09-07 DIAGNOSIS — S92001D Unspecified fracture of right calcaneus, subsequent encounter for fracture with routine healing: Secondary | ICD-10-CM | POA: Diagnosis not present

## 2021-09-07 DIAGNOSIS — S92314D Nondisplaced fracture of first metatarsal bone, right foot, subsequent encounter for fracture with routine healing: Secondary | ICD-10-CM | POA: Diagnosis not present

## 2021-09-07 DIAGNOSIS — S99921D Unspecified injury of right foot, subsequent encounter: Secondary | ICD-10-CM

## 2021-09-07 DIAGNOSIS — S99921A Unspecified injury of right foot, initial encounter: Secondary | ICD-10-CM

## 2021-09-07 MED ORDER — HYDROCODONE-ACETAMINOPHEN 5-325 MG PO TABS: 1.0000 | ORAL_TABLET | ORAL | 0 refills | Status: DC | PRN

## 2021-09-07 NOTE — Progress Notes (Addendum)
New Patient Visit  Assessment: Megan [name] is a 71 y.o. female with the following: Multiple right foot fractures, minimally displaced; nonoperative management  Plan: Pain, swelling and bruising is improving in her right foot.  X-rays are stable.  It is difficult to discern the location of all of the fractures that were identified on CT scan.  She does not tolerate the boot, and would like to proceed with a cast.  This was completed in clinic today.  I recommended strict elevation.  I provided her with an additional prescription for hydrocodone.  We will continue to wean off the narcotics.  She is aware that this will take approximately 6-8 weeks to fully heal.  Follow-up in 2 weeks for cast removal.   Cast application - right short leg cast   Verbal consent was obtained and the correct extremity was identified. A well padded, appropriately molded short leg cast was applied to the right leg Toes remained warm and well perfused.   There were no sharp edges Patient tolerated the procedure well Cast care instructions were provided    Follow-up: Return in about 2 weeks (around 09/21/2021).  Subjective:  Chief Complaint  Patient presents with   Ankle Injury    RT foot DOI 08/26/21// pt requesting cast due to boot not being comfortable and hurting    History of Present Illness: [name] is a 71 y.o. female who presents for repeat evaluation of right foot pain.  She sustained multiple fractures in her right foot approximately 2 weeks ago.  Since she was last seen in clinic, she reports that she continues to have pain in her right foot.  She has not tolerated the boot well.  She had an appointment scheduled for last week, to transition to a cast, but this was rescheduled due to the weather.  She is not bearing weight.  She is not using the boot.  She has been taking her medications as needed.  Review of Systems: No fevers or chills No numbness or tingling No chest  pain No shortness of breath No bowel or bladder dysfunction No GI distress No headaches   Objective: There were no vitals taken for this visit.  Physical Exam:  General: Alert and oriented. and No acute distress.  Seated in a wheelchair. Gait: Unable to ambulate.  Right foot with diffuse swelling.  Bruising is improving.  Tenderness to palpation over the dorsum of the foot.  Toes are warm and well perfused.  Active motion intact of the EHL/TA.  Some bruising on the plantar surface of the midfoot.   IMAGING: I personally reviewed images previously obtained from the ED  X-rays of the right foot were obtained in clinic today.  These are nonweightbearing views.  No acute injuries are noted.  No callus formation.  There are no dislocations.  Identified fracture on the CT scan are not visible.  Impression: Healing nondisplaced fractures of the right foot including the calcaneus     New Medications:  Meds ordered this encounter  Medications   HYDROcodone-acetaminophen (NORCO/VICODIN) 5-325 MG tablet    Sig: Take 1 tablet by mouth every 4 (four) hours as needed for moderate pain.    Dispense:  20 tablet    Refill:  0      Oliver Barre, MD  09/07/2021 9:42 AM

## 2021-09-07 NOTE — Patient Instructions (Addendum)
**  Please cancel the scheduled appointment for next week**  General Cast Instructions  1.  You were placed in a cast in clinic today.  Please keep the cast material clean, dry and intact.  Please do not use anything to itch the under the cast.  If it gets itchy, you can consider taking benadryl, or similar medication.  If the cast material gets wet, place it on a towel and use a hair dryer on a low setting. 2.  Tylenol or Ibuprofen/Naproxen as needed.   3.  Recommend elevating your extremity as much as possible to help with swelling. 4.  F/u 2 weeks, cast off and repeat XR

## 2021-09-13 ENCOUNTER — Encounter: Payer: Medicare Other | Admitting: Orthopedic Surgery

## 2021-09-19 ENCOUNTER — Encounter: Payer: Self-pay | Admitting: Internal Medicine

## 2021-09-21 ENCOUNTER — Other Ambulatory Visit: Payer: Self-pay

## 2021-09-21 ENCOUNTER — Encounter: Payer: Self-pay | Admitting: Orthopedic Surgery

## 2021-09-21 ENCOUNTER — Ambulatory Visit: Payer: Medicare Other

## 2021-09-21 ENCOUNTER — Encounter: Payer: Medicare Other | Admitting: Orthopedic Surgery

## 2021-09-21 ENCOUNTER — Ambulatory Visit (INDEPENDENT_AMBULATORY_CARE_PROVIDER_SITE_OTHER): Payer: Medicare Other | Admitting: Orthopedic Surgery

## 2021-09-21 DIAGNOSIS — S99921D Unspecified injury of right foot, subsequent encounter: Secondary | ICD-10-CM

## 2021-09-21 NOTE — Patient Instructions (Signed)
Wear the walking boot at all times for the next 2 weeks - no weight bearing, do not walk on your right foot.  ? ?In 2 weeks, you can start to walk while wearing the boot.  You may need a walker or a cane. ? ?Medications as needed.  No more pain medicines.  ? ?Follow up in 4 weeks. ? ? ?

## 2021-09-21 NOTE — Progress Notes (Signed)
New Patient Visit  Assessment: Megan [name] is a 71 y.o. female with the following: Multiple right foot fractures, minimally displaced; nonoperative management  Plan: Cast removed in clinic today.  Swelling is improved.  No more bruising.  X-rays remained stable.  Okay to transition to a walking boot.  This was fitted in clinic today.  No weightbearing for the next 2 weeks.  After that, she can gradually increase her weightbearing as tolerated.  Okay to start working on range of motion.  Continue with pain medications as needed.  No further narcotics will be provided.   Follow-up: Return in about 4 weeks (around 10/19/2021).  Subjective:  Chief Complaint  Patient presents with   fracture care    RT foot DOI 08/25/21 fall    History of Present Illness: Megan [name] is a 71 y.o. female who presents for repeat evaluation of right foot pain.  She sustained multiple fractures in her right foot approximately 4 weeks ago.  She has been immobilized in a cast.  She tolerated this well.  She is ready to proceed without a cast.  Her pain continues to improve.  Swelling is better.  No numbness or tingling.    Review of Systems: No fevers or chills No numbness or tingling No chest pain No shortness of breath No bowel or bladder dysfunction No GI distress No headaches   Objective: There were no vitals taken for this visit.  Physical Exam:  General: Alert and oriented. and No acute distress.  Seated in a wheelchair. Gait: Unable to ambulate.  Right foot with minimal swelling.  No bruising is appreciated.  Improved tenderness to palpation over the dorsum of the foot.  Toes are warm and well perfused.  Active motion intact of the EHL/TA.     IMAGING: I personally reviewed images previously obtained from the ED  X-rays of the right foot were obtained in clinic today.  No acute injuries are noted.  No callus formation is appreciated.  No dislocations.  Minimally displaced  fracture of the anterior process of the calcaneus is visible.  The fracture is identified on the previous CT scan are not apparent.  Impression: Multiple minimally displaced fractures throughout the left foot, with stable radiographic appearance.       New Medications:  No orders of the defined types were placed in this encounter.     Oliver Barre, MD  09/21/2021 10:27 PM

## 2021-10-11 ENCOUNTER — Other Ambulatory Visit: Payer: Self-pay

## 2021-10-11 ENCOUNTER — Ambulatory Visit (INDEPENDENT_AMBULATORY_CARE_PROVIDER_SITE_OTHER): Payer: Medicare Other | Admitting: Internal Medicine

## 2021-10-11 ENCOUNTER — Encounter: Payer: Self-pay | Admitting: Internal Medicine

## 2021-10-11 VITALS — BP 110/62 | HR 73 | Temp 97.3°F | Ht 62.0 in | Wt 195.0 lb

## 2021-10-11 DIAGNOSIS — K219 Gastro-esophageal reflux disease without esophagitis: Secondary | ICD-10-CM

## 2021-10-11 DIAGNOSIS — Z800 Family history of malignant neoplasm of digestive organs: Secondary | ICD-10-CM | POA: Diagnosis not present

## 2021-10-11 NOTE — Progress Notes (Signed)
? ? ?Primary Care Physician:  [name], MD ?Primary Gastroenterologist:  Dr. Jena Gauss ? ?Pre-Procedure History & Physical: ?HPI:  Megan [name] is a 71 y.o. female here for follow-up of GERD and positive family history of colon cancer.  GERD well-controlled on pantoprazole 40 mg daily.  No dysphagia.  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  Prior EGD demonstrated no evidence of significant esophagitis or Barrett's epithelium. ? ?Past Medical History:  ?Diagnosis Date  ? Agoraphobia with panic disorder   ? Anxiety   ? Arterial hypotension 12/20/2017  ? Arthritis   ? Chronic pain 03/08/15  ? Hydrocodone-APAP 10/325 mg, # 120 q month  ? Colon polyps   ? Complication of anesthesia   ? Depression   ? DM (diabetes mellitus) (HCC)   ? GERD (gastroesophageal reflux disease)   ? HTN (hypertension)   ? Hypothyroidism   ? Obesity   ? PAF (paroxysmal atrial fibrillation) (HCC)   ? PONV (postoperative nausea and vomiting)   ? Stroke (HCC) 08/20/2014  ? some memory loss.  ? ? ?Past Surgical History:  ?Procedure Laterality Date  ? ABDOMINAL HYSTERECTOMY  15 yrs ago  ? with bso at aph  ? CARDIAC CATHETERIZATION  2004  ? normal  ? CATARACT EXTRACTION W/PHACO Right 11/10/2019  ? Procedure: CATARACT EXTRACTION PHACO AND INTRAOCULAR LENS PLACEMENT RIGHT EYE;  Surgeon: [name], MD;  Location: [facility];  Service: Ophthalmology;  Laterality: Right;  CDE: 46.63  ? CATARACT EXTRACTION W/PHACO Left 11/24/2019  ? Procedure: CATARACT EXTRACTION PHACO AND INTRAOCULAR LENS PLACEMENT ([facility]) CDE: 7.95;  Surgeon: [name], MD;  Location: [facility];  Service: Ophthalmology;  Laterality: Left;  ? CHOLECYSTECTOMY  25 yrs ago  ? [facility]  ? COLONOSCOPY  05/04/11  ? left-sided colonic diverticulosis  ? COLONOSCOPY N/A 01/27/2016  ? diverticulosis in sigmoid colon and descending colon, active bleeding Dieulafoy lesion s/p clips X 3, next colonoscopy in 5 years due to family history of  colon cancer    ? ESOPHAGOGASTRODUODENOSCOPY  05/04/2011  ? Abnormal distal esophagus, biopsies consistent with inflammation due to acid reflux. No evidence of Barrett's. No H. pylori  ? ESOPHAGOGASTRODUODENOSCOPY N/A 01/22/2016  ? erosive gastropathy, non-bleeding gastric ulcers and 1 oozing gastric ulcer with adherent clot s/p heater probe. Negative H.pylori serology.   ? ESOPHAGOGASTRODUODENOSCOPY (EGD) WITH PROPOFOL N/A 04/27/2016  ? Dr. Jena Gauss: healed ulcers. normal esophagus  ? RADIOLOGY WITH ANESTHESIA N/A 08/20/2015  ? Procedure: RADIOLOGY WITH ANESTHESIA;  Surgeon: Julieanne Cotton, MD;  Location: [facility];  Service: Radiology;  Laterality: N/A;  ? ? ?Prior to Admission medications   ?Medication Sig Start Date End Date Taking? Authorizing Provider  ?acetaminophen (TYLENOL) 325 MG tablet Take 2 tablets (650 mg total) by mouth every 6 (six) hours as needed for mild pain, moderate pain or headache. 11/19/17  Yes Johnson, Clanford L, MD  ?apixaban (ELIQUIS) 5 MG TABS tablet Take 1 tablet (5 mg total) by mouth 2 (two) times daily. Restart on 7/14 ?Patient taking differently: Take 5 mg by mouth daily. 01/27/16  Yes Erick Blinks, MD  ?atorvastatin (LIPITOR) 80 MG tablet Take 80 mg by mouth daily.   Yes [provider]  ?Cholecalciferol (VITAMIN D-3 PO) Take 2,000 Units by mouth daily.    Yes [provider]  ?clonazePAM (KLONOPIN) 1 MG tablet Take 1 mg by mouth 2 (two) times daily as needed for anxiety.   Yes [provider]  ?Cyanocobalamin (B-12  PO) Take by mouth daily.   Yes [provider]  ?enalapril (VASOTEC) 20 MG tablet Take 20 mg by mouth daily.   Yes [provider]  ?escitalopram (LEXAPRO) 10 MG tablet Take 1 tablet (10 mg total) by mouth daily. 11/19/17  Yes Johnson, Clanford L, MD  ?glipiZIDE (GLUCOTROL) 5 MG tablet Take 5 mg by mouth every morning. 07/15/20  Yes [provider]  ?hydrochlorothiazide (HYDRODIURIL) 25 MG tablet Take 25 mg by mouth daily.    Yes [provider]  ?HYDROcodone-acetaminophen (NORCO/VICODIN) 5-325 MG tablet Take 1 tablet by mouth every 4 (four) hours as needed for moderate pain. 09/07/21  Yes Oliver Barre, MD  ?levothyroxine (SYNTHROID, LEVOTHROID) 25 MCG tablet Take 25 mcg by mouth daily. 07/14/15  Yes [provider]  ?metFORMIN (GLUCOPHAGE-XR) 500 MG 24 hr tablet Take 1,000 mg by mouth 2 (two) times daily.   Yes [provider]  ?metoprolol succinate (TOPROL-XL) 50 MG 24 hr tablet Take 1 tablet (50 mg total) by mouth daily with lunch. 12/22/17  Yes Johnson, Clanford L, MD  ?pantoprazole (PROTONIX) 40 MG tablet Take 40 mg by mouth daily.   Yes [provider]  ?pioglitazone (ACTOS) 15 MG tablet Take 15 mg by mouth daily.   Yes [provider]  ?predniSONE (DELTASONE) 5 MG tablet Take 5 mg by mouth daily.   Yes [provider]  ?traMADol (ULTRAM) 50 MG tablet Take 1 tablet (50 mg total) by mouth every 6 (six) hours as needed. 06/01/21  Yes Mardella Layman, MD  ? ? ?Allergies as of 10/11/2021 - Review Complete 10/11/2021  ?Allergen Reaction Noted  ? Naproxen Other (See Comments) 04/05/2011  ? ? ?Family History  ?Problem Relation Age of Onset  ? Colon cancer Paternal Aunt   ?     2, colon cancer  ? Colon cancer Paternal Uncle   ?     6, colon cancer  ? Colon cancer Father   ?     deceased, age 48  ? Colon cancer Other   ?     numerous cousins died before age 49 with colon cancer  ? Colon cancer Sister   ?     deceased, age 50  ? Anesthesia problems Neg Hx   ? Hypotension Neg Hx   ? Malignant hyperthermia Neg Hx   ? Pseudochol deficiency Neg Hx   ? ? ?Social History  ? ?Socioeconomic History  ? Marital status: Married  ?  Spouse name: Not on file  ? Number of children: 3  ? Years of education: Not on file  ? Highest education level: Not on file  ?Occupational History  ? Occupation: disability  ?  Employer: NOT EMPLOYED  ?Tobacco Use  ? Smoking status: Former  ?  Packs/day: 0.25  ?  Years:  15.00  ?  Pack years: 3.75  ?  Types: Cigarettes  ?  Start date: 07/17/1969  ?  Quit date: 07/18/1971  ?  Years since quitting: 50.2  ? Smokeless tobacco: Former  ?  Types: Snuff  ?  Quit date: 08/20/2014  ? Tobacco comments:  ?  never more than 1-2 cig/day in entire life  ?Vaping Use  ? Vaping Use: Never used  ?Substance and Sexual Activity  ? Alcohol use: No  ?  Alcohol/week: 0.0 standard drinks  ? Drug use: No  ? Sexual activity: Yes  ?  Birth control/protection: Surgical  ?Other Topics Concern  ? Not on file  ?Social History Narrative  ?  No contact with children since they left home at age 71. Doesn't know where they are.  ? ?Social Determinants of Health  ? ?Financial Resource Strain: Not on file  ?Food Insecurity: Not on file  ?Transportation Needs: Not on file  ?Physical Activity: Not on file  ?Stress: Not on file  ?Social Connections: Not on file  ?Intimate Partner Violence: Not on file  ? ? ?Review of Systems: ?See HPI, otherwise negative ROS ? ?Physical Exam: ?BP 110/62 (BP Location: Right Arm, Patient Position: Sitting, Cuff Size: Large)   Pulse 73   Temp (!) 97.3 ?F (36.3 ?C) (Temporal)   Ht 5\' 2"  (1.575 m)   Wt 195 lb (88.5 kg)   SpO2 99%   BMI 35.67 kg/m?  ?General:   Alert,   pleasant and cooperative in NAD; right ankle boot in place.  She comes in a wheelchair accompanied by her husband. ?Neck:  Supple; no masses or thyromegaly. No significant cervical adenopathy. ?Lungs:  Clear throughout to auscultation.   No wheezes, crackles, or rhonchi. No acute distress. ?Heart:  Regular rate and rhythm; no murmurs, clicks, rubs,  or gallops. ?Abdomen: Non-distended, normal bowel sounds.  Soft and nontender without appreciable mass or hepatosplenomegaly.  ? ?Impression/Plan: 71 year old lady with longstanding GERD without complications.  She is well maintained on pantoprazole 40 mg daily.  No alarm symptoms. ?In this setting, I feel the benefits of long-term acid suppression therapy outweigh the  risks. ? ?Positive family history of colon cancer; last colonoscopy 2017.  She is due for surveillance colonoscopy at this time.  Practically speaking, she needs to recover from her orthopedic injury prior to embarking on col

## 2021-10-11 NOTE — Patient Instructions (Signed)
It was good seeing you again today! ? ?Continue pantoprazole or Protonix 40 mg daily (30 minutes before breakfast) for reflux ? ?We will call you in about 3 months to see how you are doing and determine the timing of your next high risk screening colonoscopy which should take place by the end of the year ? ?If you have any interim problems please let us know. ? ?Routine office follow-up 1 year from now. ?

## 2021-10-19 ENCOUNTER — Ambulatory Visit: Payer: Medicare Other

## 2021-10-19 ENCOUNTER — Ambulatory Visit (INDEPENDENT_AMBULATORY_CARE_PROVIDER_SITE_OTHER): Payer: Medicare Other | Admitting: Orthopedic Surgery

## 2021-10-19 DIAGNOSIS — S92254D Nondisplaced fracture of navicular [scaphoid] of right foot, subsequent encounter for fracture with routine healing: Secondary | ICD-10-CM | POA: Diagnosis not present

## 2021-10-19 DIAGNOSIS — S92001D Unspecified fracture of right calcaneus, subsequent encounter for fracture with routine healing: Secondary | ICD-10-CM | POA: Diagnosis not present

## 2021-10-19 DIAGNOSIS — S92314D Nondisplaced fracture of first metatarsal bone, right foot, subsequent encounter for fracture with routine healing: Secondary | ICD-10-CM

## 2021-10-19 DIAGNOSIS — S99921D Unspecified injury of right foot, subsequent encounter: Secondary | ICD-10-CM

## 2021-10-20 ENCOUNTER — Encounter: Payer: Self-pay | Admitting: Orthopedic Surgery

## 2021-10-20 NOTE — Progress Notes (Signed)
Return Patient Visit ? ?Assessment: ?Megan [name] is a 71 y.o. female with the following: ?Multiple right foot fractures, minimally displaced; nonoperative management ? ?Plan: ?Radiographs stable.  Her pain is improving.  She is able to ambulate.  Stressed the importance of ambulating to improve her strength, and to improve the strength of the bone overall.  Continue ambulating with the assistance of a cane and/or a walker.  Medications as needed.  She may experience some swelling following the injury, which is normal.  Contact clinic with concerns or questions.  Follow-up as needed. ? ? ?Follow-up: ?Return if symptoms worsen or fail to improve. ? ?Subjective: ? ?Chief Complaint  ?Patient presents with  ? Foot Injury  ?  RIGHT foot ?DOI 08/25/21 ?Patient removed boot and has been trying to walk without it  ? ? ?History of Present Illness: ?Megan [name] is a 71 y.o. female who presents for repeat evaluation of right foot pain.  She sustained multiple fractures in her right foot approximately 8 weeks ago.  She has been walking in a walking boot for a couple of weeks.  She is started to try and remove the boot.  She continues to use a cane to assist with ambulation.  Pain is controlled.  Small amount of swelling in the foot. ? ?Review of Systems: ?No fevers or chills ?No numbness or tingling ?No chest pain ?No shortness of breath ?No bowel or bladder dysfunction ?No GI distress ?No headaches ? ? ?Objective: ?There were no vitals taken for this visit. ? ?Physical Exam: ? ?General: Alert and oriented. and No acute distress.  Seated in a wheelchair. ?Gait: Unable to ambulate. ? ?Right foot without swelling.  No bruising is appreciated.  Tenderness to palpation over the anterior ankle.  Otherwise, there is no tenderness about the foot.  She tolerates dorsiflexion and plantarflexion of the ankle.  Sensations intact over the dorsum of the foot.  Toes warm and well-perfused. ? ? ?IMAGING: ?I personally reviewed  images previously obtained from the ED ? ?X-rays of the right foot were obtained in clinic today.  These were compared to prior x-rays.  There is no evidence of an acute injury.  No callus formation is appreciated.  Disuse osteopenia throughout the right foot.  No dislocations.  Unchanged from prior radiographs. ? ?Impression: Right foot x-rays with multiple, nondisplaced fractures and stable radiographic appearance. ? ? ?  ? ? ?New Medications:  ?No orders of the defined types were placed in this encounter. ? ? ? ? ?Oliver Barre, MD ? ?10/20/2021 ?8:42 AM ? ? ?

## 2021-11-09 ENCOUNTER — Ambulatory Visit (INDEPENDENT_AMBULATORY_CARE_PROVIDER_SITE_OTHER): Payer: Medicare Other | Admitting: Orthopedic Surgery

## 2021-11-09 ENCOUNTER — Ambulatory Visit (INDEPENDENT_AMBULATORY_CARE_PROVIDER_SITE_OTHER): Payer: Medicare Other

## 2021-11-09 ENCOUNTER — Encounter: Payer: Self-pay | Admitting: Orthopedic Surgery

## 2021-11-09 DIAGNOSIS — M25561 Pain in right knee: Secondary | ICD-10-CM | POA: Diagnosis not present

## 2021-11-09 NOTE — Progress Notes (Signed)
Orthopaedic Clinic Return ? ?Assessment: ?Megan [name] is a 71 y.o. female with the following: ?Acute right knee pain.  Loss of joint space medially ? ?Plan: ?Acute onset of right knee pain.  She has recovered from multiple fractures in her right foot.  Since removing the walking boot, she has noticed pain, swelling and inflammation of the right knee.  Medications are not improving her symptoms.  I have recommended an injection, and this was completed in clinic today. ? ? ?Procedure note injection Right knee joint ?  ?Verbal consent was obtained to inject the right knee joint  ?Timeout was completed to confirm the site of injection.  The skin was prepped with alcohol and ethyl chloride was sprayed at the injection site.  ?A 21-gauge needle was used to inject 40 mg of Depo-Medrol and 1% lidocaine (3 cc) into the right knee using an anterolateral approach.  ?There were no complications. A sterile bandage was applied.  ? ?Follow-up: ?Return if symptoms worsen or fail to improve. ? ? ?Subjective: ? ?Chief Complaint  ?Patient presents with  ? Knee Pain  ?  RT knee/ having trouble weight bearing/ pt states leg feels hot ?Has been painful since taking the boot off on 10/19/21  ? ? ?History of Present Illness: ?Megan [name] is a 71 y.o. female who returns to clinic for evaluation of right knee pain.  She is well-known to the clinic recovered from multiple fractures of the right foot.  Just a few weeks ago, she finally started to come out of the walking boot.  Since then, she has noticed pain in the right knee.  She is also noticed swelling and some inflammation.  She feels it is warm to the touch.  She also has some radiating pains distally in the anterior aspect of the lower leg. ? ?Review of Systems: ?No fevers or chills ?No numbness or tingling ?No chest pain ?No shortness of breath ?No bowel or bladder dysfunction ?No GI distress ?No headaches ? ? ?Objective: ?There were no vitals taken for this  visit. ? ?Physical Exam: ? ?Elderly female.  Alert and oriented.  Seated in a wheelchair. ? ?Right knee with mild effusion.  Baker's cyst in the popliteal fossa.  Tenderness to palpation along the medial lateral joint line.  Tenderness to palpation within the anterior compartment of the lower leg.  Sensation is intact distally.  Negative Lachman.  No increased instability to varus or valgus stress. ? ?IMAGING: ?I personally ordered and reviewed the following images: ? ?X-ray of the right knee was obtained in clinic today.  Near complete loss of joint space within the medial compartment.  Small osteophytes are noted.  No acute injuries. ? ?Impression: Right knee x-rays with mild to moderate degenerative changes, most prominent within the medial compartment. ? ?Mordecai Rasmussen, MD ?11/09/2021 ?10:54 PM ? ? ?

## 2021-11-09 NOTE — Patient Instructions (Addendum)
?Knee Exercises ? ?Ask your health care provider which exercises are safe for you. Do exercises exactly as told by your health care provider and adjust them as directed. It is normal to feel mild stretching, pulling, tightness, or discomfort as you do these exercises. Stop right away if you feel sudden pain or your pain gets worse. Do not begin these exercises until told by your health care provider. ? ?Stretching and range-of-motion exercises ?These exercises warm up your muscles and joints and improve the movement and flexibility of your knee. These exercises also help to relieve pain and swelling. ? ?Knee extension, prone ?Lie on your abdomen (prone position) on a bed. ?Place your left / right knee just beyond the edge of the surface so your knee is not on the bed. You can put a towel under your left / right thigh just above your kneecap for comfort. ?Relax your leg muscles and allow gravity to straighten your knee (extension). You should feel a stretch behind your left / right knee. ?Hold this position for 10 seconds. ?Scoot up so your knee is supported between repetitions. ?Repeat 10 times. Complete this exercise 3-4 times per week. ?    ?Knee flexion, active ?Lie on your back with both legs straight. If this causes back discomfort, bend your left / right knee so your foot is flat on the floor. ?Slowly slide your left / right heel back toward your buttocks. Stop when you feel a gentle stretch in the front of your knee or thigh (flexion). ?Hold this position for 10 seconds. ?Slowly slide your left / right heel back to the starting position. ?Repeat 10 times. Complete this exercise 3-4 times per week. ?  ?   ?Quadriceps stretch, prone ?Lie on your abdomen on a firm surface, such as a bed or padded floor. ?Bend your left / right knee and hold your ankle. If you cannot reach your ankle or pant leg, loop a belt around your foot and grab the belt instead. ?Gently pull your heel toward your buttocks. Your knee should  not slide out to the side. You should feel a stretch in the front of your thigh and knee (quadriceps). ?Hold this position for 10 seconds. ?Repeat 10 times. Complete this exercise 3-4 times per week. ?  ?   ?Hamstring, supine ?Lie on your back (supine position). ?Loop a belt or towel over the ball of your left / right foot. The ball of your foot is on the walking surface, right under your toes. ?Straighten your left / right knee and slowly pull on the belt to raise your leg until you feel a gentle stretch behind your knee (hamstring). ?Do not let your knee bend while you do this. ?Keep your other leg flat on the floor. ?Hold this position for 10 seconds. ?Repeat 10 times. Complete this exercise 3-4 times per week. ? ? ?Strengthening exercises ?These exercises build strength and endurance in your knee. Endurance is the ability to use your muscles for a long time, even after they get tired. ? ?Quadriceps, isometric ?This exercise stretches the muscles in front of your thigh (quadriceps) without moving your knee joint (isometric). ?Lie on your back with your left / right leg extended and your other knee bent. Put a rolled towel or small pillow under your knee if told by your health care provider. ?Slowly tense the muscles in the front of your left / right thigh. You should see your kneecap slide up toward your hip or see increased dimpling   just above the knee. This motion will push the back of the knee toward the floor. ?For 10 seconds, hold the muscle as tight as you can without increasing your pain. ?Relax the muscles slowly and completely. ?Repeat 10 times. Complete this exercise 3-4 times per week. ?.  ?   ?Straight leg raises ?This exercise stretches the muscles in front of your thigh (quadriceps) and the muscles that move your hips (hip flexors). ?Lie on your back with your left / right leg extended and your other knee bent. ?Tense the muscles in the front of your left / right thigh. You should see your kneecap  slide up or see increased dimpling just above the knee. Your thigh may even shake a bit. ?Keep these muscles tight as you raise your leg 4-6 inches (10-15 cm) off the floor. Do not let your knee bend. ?Hold this position for 10 seconds. ?Keep these muscles tense as you lower your leg. ?Relax your muscles slowly and completely after each repetition. ?Repeat 10 times. Complete this exercise 3-4 times per week. ? ?Hamstring, isometric ?Lie on your back on a firm surface. ?Bend your left / right knee about 30 degrees. ?Dig your left / right heel into the surface as if you are trying to pull it toward your buttocks. Tighten the muscles in the back of your thighs (hamstring) to "dig" as hard as you can without increasing any pain. ?Hold this position for 10 seconds. ?Release the tension gradually and allow your muscles to relax completely for __________ seconds after each repetition. ?Repeat 10 times. Complete this exercise 3-4 times per week. ? ?Hamstring curls ?If told by your health care provider, do this exercise while wearing ankle weights. Begin with 5 lb weights. Then increase the weight by 1 lb (0.5 kg) increments. You can also use an exercise band ?Lie on your abdomen with your legs straight. ?Bend your left / right knee as far as you can without feeling pain. Keep your hips flat against the floor. ?Hold this position for 10 seconds. ?Slowly lower your leg to the starting position. ?Repeat 10 times. Complete this exercise 3-4 times per week. ?  ?   ?Squats ?This exercise strengthens the muscles in front of your thigh and knee (quadriceps). ?Stand in front of a table, with your feet and knees pointing straight ahead. You may rest your hands on the table for balance but not for support. ?Slowly bend your knees and lower your hips like you are going to sit in a chair. ?Keep your weight over your heels, not over your toes. ?Keep your lower legs upright so they are parallel with the table legs. ?Do not let your hips  go lower than your knees. ?Do not bend lower than told by your health care provider. ?If your knee pain increases, do not bend as low. ?Hold the squat position for 10 seconds. ?Slowly push with your legs to return to standing. Do not use your hands to pull yourself to standing. ?Repeat 10 times. Complete this exercise 3-4 times per week ?. ?    ?Wall slides ?This exercise strengthens the muscles in front of your thigh and knee (quadriceps). ?Lean your back against a smooth wall or door, and walk your feet out 18-24 inches (46-61 cm) from it. ?Place your feet hip-width apart. ?Slowly slide down the wall or door until your knees bend 90 degrees. Keep your knees over your heels, not over your toes. Keep your knees in line with your hips. ?Hold   this position for 10 seconds. ?Repeat 10 times. Complete this exercise 3-4 times per week. ?  ?   ?Straight leg raises ?This exercise strengthens the muscles that rotate the leg at the hip and move it away from your body (hip abductors). ?Lie on your side with your left / right leg in the top position. Lie so your head, shoulder, knee, and hip line up. You may bend your bottom knee to help you keep your balance. ?Roll your hips slightly forward so your hips are stacked directly over each other and your left / right knee is facing forward. ?Leading with your heel, lift your top leg 4-6 inches (10-15 cm). You should feel the muscles in your outer hip lifting. ?Do not let your foot drift forward. ?Do not let your knee roll toward the ceiling. ?Hold this position for 10 seconds. ?Slowly return your leg to the starting position. ?Let your muscles relax completely after each repetition. ?Repeat 10 times. Complete this exercise 3-4 times per week. ?  ?   ?Straight leg raises ?This exercise stretches the muscles that move your hips away from the front of the pelvis (hip extensors). ?Lie on your abdomen on a firm surface. You can put a pillow under your hips if that is more  comfortable. ?Tense the muscles in your buttocks and lift your left / right leg about 4-6 inches (10-15 cm). Keep your knee straight as you lift your leg. ?Hold this position for 10 seconds. ?Slowly lower your leg t

## 2022-03-30 ENCOUNTER — Emergency Department (HOSPITAL_COMMUNITY): Payer: Medicare Other

## 2022-03-30 ENCOUNTER — Inpatient Hospital Stay (HOSPITAL_COMMUNITY)
Admission: EM | Admit: 2022-03-30 | Discharge: 2022-04-02 | DRG: 871 | Disposition: A | Discharge status: Home / Self Care | Payer: Medicare Other | Attending: Family Medicine | Admitting: Family Medicine

## 2022-03-30 ENCOUNTER — Other Ambulatory Visit: Payer: Self-pay

## 2022-03-30 ENCOUNTER — Encounter (HOSPITAL_COMMUNITY): Payer: Self-pay

## 2022-03-30 ENCOUNTER — Encounter (HOSPITAL_COMMUNITY): Payer: Self-pay | Admitting: *Deleted

## 2022-03-30 DIAGNOSIS — Z9841 Cataract extraction status, right eye: Secondary | ICD-10-CM

## 2022-03-30 DIAGNOSIS — R6521 Severe sepsis with septic shock: Secondary | ICD-10-CM | POA: Diagnosis present

## 2022-03-30 DIAGNOSIS — F32A Depression, unspecified: Secondary | ICD-10-CM | POA: Diagnosis present

## 2022-03-30 DIAGNOSIS — F4001 Agoraphobia with panic disorder: Secondary | ICD-10-CM | POA: Diagnosis present

## 2022-03-30 DIAGNOSIS — R413 Other amnesia: Secondary | ICD-10-CM | POA: Diagnosis present

## 2022-03-30 DIAGNOSIS — G894 Chronic pain syndrome: Secondary | ICD-10-CM | POA: Diagnosis present

## 2022-03-30 DIAGNOSIS — Z8673 Personal history of transient ischemic attack (TIA), and cerebral infarction without residual deficits: Secondary | ICD-10-CM

## 2022-03-30 DIAGNOSIS — M1990 Unspecified osteoarthritis, unspecified site: Secondary | ICD-10-CM | POA: Diagnosis present

## 2022-03-30 DIAGNOSIS — R1084 Generalized abdominal pain: Secondary | ICD-10-CM | POA: Diagnosis present

## 2022-03-30 DIAGNOSIS — E039 Hypothyroidism, unspecified: Secondary | ICD-10-CM | POA: Diagnosis present

## 2022-03-30 DIAGNOSIS — A419 Sepsis, unspecified organism: Principal | ICD-10-CM | POA: Diagnosis present

## 2022-03-30 DIAGNOSIS — F419 Anxiety disorder, unspecified: Secondary | ICD-10-CM | POA: Diagnosis not present

## 2022-03-30 DIAGNOSIS — E119 Type 2 diabetes mellitus without complications: Secondary | ICD-10-CM

## 2022-03-30 DIAGNOSIS — I959 Hypotension, unspecified: Secondary | ICD-10-CM | POA: Diagnosis present

## 2022-03-30 DIAGNOSIS — I10 Essential (primary) hypertension: Secondary | ICD-10-CM | POA: Diagnosis present

## 2022-03-30 DIAGNOSIS — I480 Paroxysmal atrial fibrillation: Secondary | ICD-10-CM | POA: Diagnosis present

## 2022-03-30 DIAGNOSIS — Z7901 Long term (current) use of anticoagulants: Secondary | ICD-10-CM

## 2022-03-30 DIAGNOSIS — Z79891 Long term (current) use of opiate analgesic: Secondary | ICD-10-CM

## 2022-03-30 DIAGNOSIS — Z8719 Personal history of other diseases of the digestive system: Secondary | ICD-10-CM | POA: Diagnosis not present

## 2022-03-30 DIAGNOSIS — Z9842 Cataract extraction status, left eye: Secondary | ICD-10-CM

## 2022-03-30 DIAGNOSIS — E669 Obesity, unspecified: Secondary | ICD-10-CM | POA: Diagnosis present

## 2022-03-30 DIAGNOSIS — Z6834 Body mass index (BMI) 34.0-34.9, adult: Secondary | ICD-10-CM | POA: Diagnosis not present

## 2022-03-30 DIAGNOSIS — N3090 Cystitis, unspecified without hematuria: Secondary | ICD-10-CM | POA: Diagnosis present

## 2022-03-30 DIAGNOSIS — Z7984 Long term (current) use of oral hypoglycemic drugs: Secondary | ICD-10-CM

## 2022-03-30 DIAGNOSIS — K219 Gastro-esophageal reflux disease without esophagitis: Secondary | ICD-10-CM | POA: Diagnosis present

## 2022-03-30 DIAGNOSIS — Z7952 Long term (current) use of systemic steroids: Secondary | ICD-10-CM

## 2022-03-30 DIAGNOSIS — Z79890 Hormone replacement therapy (postmenopausal): Secondary | ICD-10-CM

## 2022-03-30 DIAGNOSIS — Z961 Presence of intraocular lens: Secondary | ICD-10-CM | POA: Diagnosis present

## 2022-03-30 DIAGNOSIS — I482 Chronic atrial fibrillation, unspecified: Secondary | ICD-10-CM | POA: Diagnosis present

## 2022-03-30 DIAGNOSIS — E1169 Type 2 diabetes mellitus with other specified complication: Secondary | ICD-10-CM | POA: Diagnosis not present

## 2022-03-30 DIAGNOSIS — Z9049 Acquired absence of other specified parts of digestive tract: Secondary | ICD-10-CM

## 2022-03-30 DIAGNOSIS — Z90710 Acquired absence of both cervix and uterus: Secondary | ICD-10-CM | POA: Diagnosis not present

## 2022-03-30 DIAGNOSIS — I69354 Hemiplegia and hemiparesis following cerebral infarction affecting left non-dominant side: Secondary | ICD-10-CM | POA: Diagnosis not present

## 2022-03-30 DIAGNOSIS — Z79899 Other long term (current) drug therapy: Secondary | ICD-10-CM

## 2022-03-30 DIAGNOSIS — N179 Acute kidney failure, unspecified: Secondary | ICD-10-CM

## 2022-03-30 DIAGNOSIS — G8929 Other chronic pain: Secondary | ICD-10-CM | POA: Diagnosis present

## 2022-03-30 DIAGNOSIS — K5792 Diverticulitis of intestine, part unspecified, without perforation or abscess without bleeding: Secondary | ICD-10-CM | POA: Diagnosis present

## 2022-03-30 DIAGNOSIS — Z886 Allergy status to analgesic agent status: Secondary | ICD-10-CM

## 2022-03-30 DIAGNOSIS — Z87891 Personal history of nicotine dependence: Secondary | ICD-10-CM | POA: Diagnosis not present

## 2022-03-30 LAB — MRSA NEXT GEN BY PCR, NASAL: MRSA by PCR Next Gen: NOT DETECTED

## 2022-03-30 LAB — URINALYSIS, ROUTINE W REFLEX MICROSCOPIC
Bilirubin Urine: NEGATIVE
Glucose, UA: 50 mg/dL — AB
Ketones, ur: NEGATIVE mg/dL
Nitrite: NEGATIVE
Protein, ur: 100 mg/dL — AB
Specific Gravity, Urine: 1.02 (ref 1.005–1.030)
WBC, UA: >50 WBC/hpf — ABNORMAL HIGH (ref 0–5)
pH: 5 (ref 5.0–8.0)

## 2022-03-30 LAB — BLOOD GAS, ARTERIAL
Acid-base deficit: 8.5 mmol/L — ABNORMAL HIGH (ref 0.0–2.0)
Bicarbonate: 19.2 mmol/L — ABNORMAL LOW (ref 20.0–28.0)
Drawn by: 30136
O2 Saturation: 100 %
Patient temperature: 37
pCO2 arterial: 49 mmHg — ABNORMAL HIGH (ref 32–48)
pH, Arterial: 7.2 — ABNORMAL LOW (ref 7.35–7.45)
pO2, Arterial: 222 mmHg — ABNORMAL HIGH (ref 83–108)

## 2022-03-30 LAB — COMPREHENSIVE METABOLIC PANEL
ALT: 15 U/L (ref 0–44)
AST: 30 U/L (ref 15–41)
Albumin: 3.1 g/dL — ABNORMAL LOW (ref 3.5–5.0)
Alkaline Phosphatase: 121 U/L (ref 38–126)
Anion gap: 10 (ref 5–15)
BUN: 28 mg/dL — ABNORMAL HIGH (ref 8–23)
CO2: 22 mmol/L (ref 22–32)
Calcium: 8.2 mg/dL — ABNORMAL LOW (ref 8.9–10.3)
Chloride: 102 mmol/L (ref 98–111)
Creatinine, Ser: 3.69 mg/dL — ABNORMAL HIGH (ref 0.44–1.00)
GFR, Estimated: 13 mL/min — ABNORMAL LOW (ref >60)
Glucose, Bld: 136 mg/dL — ABNORMAL HIGH (ref 70–99)
Potassium: 4.3 mmol/L (ref 3.5–5.1)
Sodium: 134 mmol/L — ABNORMAL LOW (ref 135–145)
Total Bilirubin: 0.6 mg/dL (ref 0.3–1.2)
Total Protein: 7.1 g/dL (ref 6.5–8.1)

## 2022-03-30 LAB — CBC WITH DIFFERENTIAL/PLATELET
Abs Immature Granulocytes: 0.03 10*3/uL (ref 0.00–0.07)
Basophils Absolute: 0 10*3/uL (ref 0.0–0.1)
Basophils Relative: 0 %
Eosinophils Absolute: 0.2 10*3/uL (ref 0.0–0.5)
Eosinophils Relative: 2 %
HCT: 35.2 % — ABNORMAL LOW (ref 36.0–46.0)
Hemoglobin: 10.6 g/dL — ABNORMAL LOW (ref 12.0–15.0)
Immature Granulocytes: 0 %
Lymphocytes Relative: 23 %
Lymphs Abs: 2 10*3/uL (ref 0.7–4.0)
MCH: 30.9 pg (ref 26.0–34.0)
MCHC: 30.1 g/dL (ref 30.0–36.0)
MCV: 102.6 fL — ABNORMAL HIGH (ref 80.0–100.0)
Monocytes Absolute: 0.6 10*3/uL (ref 0.1–1.0)
Monocytes Relative: 6 %
Neutro Abs: 6.1 10*3/uL (ref 1.7–7.7)
Neutrophils Relative %: 69 %
Platelets: 177 10*3/uL (ref 150–400)
RBC: 3.43 MIL/uL — ABNORMAL LOW (ref 3.87–5.11)
RDW: 14.2 % (ref 11.5–15.5)
WBC: 8.9 10*3/uL (ref 4.0–10.5)
nRBC: 0 % (ref 0.0–0.2)

## 2022-03-30 LAB — POC OCCULT BLOOD, ED: Fecal Occult Bld: NEGATIVE

## 2022-03-30 LAB — TSH: TSH: 1.908 u[IU]/mL (ref 0.350–4.500)

## 2022-03-30 LAB — BRAIN NATRIURETIC PEPTIDE: B Natriuretic Peptide: 495 pg/mL — ABNORMAL HIGH (ref 0.0–100.0)

## 2022-03-30 LAB — LACTIC ACID, PLASMA
Lactic Acid, Venous: 1.6 mmol/L (ref 0.5–1.9)
Lactic Acid, Venous: 1.7 mmol/L (ref 0.5–1.9)

## 2022-03-30 LAB — T4, FREE: Free T4: 1.06 ng/dL (ref 0.61–1.12)

## 2022-03-30 LAB — GLUCOSE, CAPILLARY: Glucose-Capillary: 162 mg/dL — ABNORMAL HIGH (ref 70–99)

## 2022-03-30 LAB — PROTIME-INR
INR: 1.1 (ref 0.8–1.2)
Prothrombin Time: 14.2 seconds (ref 11.4–15.2)

## 2022-03-30 MED ORDER — SODIUM CHLORIDE 0.9 % IV SOLN
250.0000 mL | INTRAVENOUS | Status: DC
  Administered 2022-03-30: 250 mL via INTRAVENOUS

## 2022-03-30 MED ORDER — ALBUTEROL SULFATE (2.5 MG/3ML) 0.083% IN NEBU: 2.5000 mg | INHALATION_SOLUTION | RESPIRATORY_TRACT | Status: DC | PRN

## 2022-03-30 MED ORDER — PANTOPRAZOLE SODIUM 40 MG PO TBEC
40.0000 mg | DELAYED_RELEASE_TABLET | Freq: Every day | ORAL | Status: DC
  Administered 2022-03-30 – 2022-04-02 (×4): 40 mg via ORAL
  Filled 2022-03-30 (×4): qty 1

## 2022-03-30 MED ORDER — NOREPINEPHRINE 4 MG/250ML-% IV SOLN
INTRAVENOUS | Status: AC
  Administered 2022-03-30: 2 ug/min via INTRAVENOUS
  Filled 2022-03-30: qty 250

## 2022-03-30 MED ORDER — SODIUM CHLORIDE 0.9 % IV BOLUS
1000.0000 mL | Freq: Once | INTRAVENOUS | Status: AC
  Administered 2022-03-30: 1000 mL via INTRAVENOUS

## 2022-03-30 MED ORDER — ONDANSETRON HCL 4 MG/2ML IJ SOLN: 4.0000 mg | Freq: Four times a day (QID) | INTRAMUSCULAR | Status: DC | PRN

## 2022-03-30 MED ORDER — SODIUM CHLORIDE 0.9 % IV SOLN
2.0000 g | Freq: Once | INTRAVENOUS | Status: AC
  Administered 2022-03-30: 2 g via INTRAVENOUS
  Filled 2022-03-30: qty 20

## 2022-03-30 MED ORDER — INSULIN ASPART 100 UNIT/ML IJ SOLN
0.0000 [IU] | Freq: Every day | INTRAMUSCULAR | Status: DC
  Administered 2022-03-31: 3 [IU] via SUBCUTANEOUS
  Administered 2022-04-01: 2 [IU] via SUBCUTANEOUS

## 2022-03-30 MED ORDER — LEVOTHYROXINE SODIUM 25 MCG PO TABS
25.0000 ug | ORAL_TABLET | Freq: Every day | ORAL | Status: DC
  Administered 2022-03-30 – 2022-04-02 (×4): 25 ug via ORAL
  Filled 2022-03-30 (×4): qty 1

## 2022-03-30 MED ORDER — SODIUM CHLORIDE 0.9 % IV SOLN
2.0000 g | INTRAVENOUS | Status: DC
  Administered 2022-03-31: 2 g via INTRAVENOUS
  Filled 2022-03-30: qty 20

## 2022-03-30 MED ORDER — ONDANSETRON HCL 4 MG PO TABS: 4.0000 mg | ORAL_TABLET | Freq: Four times a day (QID) | ORAL | Status: DC | PRN

## 2022-03-30 MED ORDER — METRONIDAZOLE 500 MG/100ML IV SOLN
500.0000 mg | Freq: Two times a day (BID) | INTRAVENOUS | Status: DC
  Administered 2022-03-31 – 2022-04-01 (×3): 500 mg via INTRAVENOUS
  Filled 2022-03-30 (×3): qty 100

## 2022-03-30 MED ORDER — INSULIN ASPART 100 UNIT/ML IJ SOLN
0.0000 [IU] | Freq: Three times a day (TID) | INTRAMUSCULAR | Status: DC
  Administered 2022-03-31: 3 [IU] via SUBCUTANEOUS
  Administered 2022-03-31: 5 [IU] via SUBCUTANEOUS
  Administered 2022-03-31 – 2022-04-01 (×2): 3 [IU] via SUBCUTANEOUS
  Administered 2022-04-01: 5 [IU] via SUBCUTANEOUS
  Administered 2022-04-01 – 2022-04-02 (×2): 3 [IU] via SUBCUTANEOUS

## 2022-03-30 MED ORDER — APIXABAN 5 MG PO TABS
5.0000 mg | ORAL_TABLET | Freq: Two times a day (BID) | ORAL | Status: DC
  Administered 2022-03-31 – 2022-04-02 (×5): 5 mg via ORAL
  Filled 2022-03-30 (×5): qty 1

## 2022-03-30 MED ORDER — SODIUM CHLORIDE 0.9 % IV BOLUS
3000.0000 mL | Freq: Once | INTRAVENOUS | Status: AC
  Administered 2022-03-30: 3000 mL via INTRAVENOUS

## 2022-03-30 MED ORDER — NOREPINEPHRINE 4 MG/250ML-% IV SOLN
2.0000 ug/min | INTRAVENOUS | Status: DC
  Administered 2022-03-30: 2 ug/min via INTRAVENOUS

## 2022-03-30 MED ORDER — METHYLPREDNISOLONE SODIUM SUCC 125 MG IJ SOLR
125.0000 mg | Freq: Once | INTRAMUSCULAR | Status: AC
  Administered 2022-03-30: 125 mg via INTRAVENOUS
  Filled 2022-03-30: qty 2

## 2022-03-30 MED ORDER — CLONAZEPAM 0.25 MG PO TBDP
0.2500 mg | ORAL_TABLET | Freq: Three times a day (TID) | ORAL | Status: DC | PRN
  Administered 2022-04-01: 0.25 mg via ORAL
  Filled 2022-03-30: qty 1

## 2022-03-30 MED ORDER — SODIUM CHLORIDE 0.9 % IV SOLN: 250.0000 mL | INTRAVENOUS | Status: DC

## 2022-03-30 MED ORDER — METHYLPREDNISOLONE SODIUM SUCC 125 MG IJ SOLR
80.0000 mg | Freq: Every day | INTRAMUSCULAR | Status: DC
  Administered 2022-03-31: 80 mg via INTRAVENOUS
  Filled 2022-03-30: qty 2

## 2022-03-30 MED ORDER — ACETAMINOPHEN 650 MG RE SUPP: 650.0000 mg | Freq: Four times a day (QID) | RECTAL | Status: DC | PRN

## 2022-03-30 MED ORDER — NOREPINEPHRINE 4 MG/250ML-% IV SOLN
2.0000 ug/min | INTRAVENOUS | Status: DC
  Filled 2022-03-30: qty 250

## 2022-03-30 MED ORDER — SODIUM CHLORIDE 0.9 % IV BOLUS
500.0000 mL | Freq: Once | INTRAVENOUS | Status: AC
  Administered 2022-03-30: 500 mL via INTRAVENOUS

## 2022-03-30 MED ORDER — METRONIDAZOLE 500 MG/100ML IV SOLN
500.0000 mg | INTRAVENOUS | Status: AC
  Administered 2022-03-30: 500 mg via INTRAVENOUS
  Filled 2022-03-30: qty 100

## 2022-03-30 MED ORDER — ACETAMINOPHEN 325 MG PO TABS: 650.0000 mg | ORAL_TABLET | Freq: Four times a day (QID) | ORAL | Status: DC | PRN

## 2022-03-30 NOTE — ED Provider Notes (Signed)
  Physical Exam  BP 90/70   Pulse 69   Temp 99 F (37.2 C)   Resp 13   Ht 5\' 3"  (1.6 m)   Wt 89.4 kg   SpO2 90%   BMI 34.90 kg/m   Physical Exam  Procedures  Procedures  ED Course / MDM   Clinical Course as of 03/31/22 1007  Thu Mar 30, 2022  1539 Assumed care from Dr Apr 01, 2022. 71 yo F with recent dx of UTI, diabetes, atrial fibrillation, hypertension, and hypothyroidism who presented with weakness. Was hypotensive on arrival. Initiated sepsis protocol. Awaiting labs. But does have AKI. BP improved with fluids. Had intermittent episode of hypoxia into the 60s. Concerned that the monitor may have been incorrect. Has gotten about 4L of fluids at this time. Currently in no resp distress on 4L.  [RP]  1719 Dr 62 from ICU called.  Does not recommend starting Levophed unless the patient's MAP is persistently below 65 despite her decreased urine output and low systolic blood pressure.  Feels that due to her normal lactate it is unlikely that this patient is in shock at this time.  He recommends discussing with the hospitalist to see if they feel comfortable excepting her here at [facility]. [RP]  1740 Discussed with Dr [facility] who will admit the patient to stepdown unit. [RP]    Clinical Course User Index [RP] Randol Kern, MD   Medical Decision Making Amount and/or Complexity of Data Reviewed Labs: ordered. Radiology: ordered. ECG/medicine tests: ordered.  Risk Prescription drug management. Decision regarding hospitalization.      Rondel Baton, MD 03/31/22 1007

## 2022-03-30 NOTE — H&P (Signed)
TRH H&P   Patient Demographics:    Megan [name], is a 71 y.o. female  MRN: [id number]   DOB - Oct 27, 1950  Admit Date - 03/30/2022  Outpatient Primary MD for the patient is [name], MD  Referring MD/NP/PA: Dr Eloise Harman  Patient coming from: home  Chief Complaint  Patient presents with   Abdominal Pain      HPI:    Megan [name]  is a 71 y.o. 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  [facility]. -She presents to ED secondary to complaints of abdominal pain, she was seen by her PCP couple days ago, and started on some antibiotics for UTI, husband at bedside assist with the history, patient with very poor oral intake, fluid intake, and lysed weakness, fatigue, she does report abdominal pain, she cannot specify dysuria or polyuria, she denies fever, chills, does report nausea, denies any vomiting. -In emergency department her work-up was significant for low blood pressure, which responded initially to IV fluids, she was noted to be with a new AKI, creatinine of 3.69, from baseline of 1.28 in 2021, bladder scan showing post residual volume of 0, Foley catheter was inserted, she was hypotensive due to septic shock, work-up significant for acute diverticulitis, and cystitis on imaging, she had pyuria, she started on Rocephin and Flagyl, she did require Levophed for pressure support, as well she did receive IV Solu-Medrol given she is steroid dependent.    Review of systems:     A full 10 point Review of Systems was done, except as stated above, all other Review of Systems were negative.   With Past History of the following :    Past Medical History:  Diagnosis  Date   Agoraphobia with panic disorder    Anxiety    Arterial hypotension 12/20/2017   Arthritis    Chronic pain 03/08/15   Hydrocodone-APAP 10/325 mg, # 120 q month   Colon polyps    Complication of anesthesia    Depression    DM (diabetes mellitus) (HCC)    GERD (gastroesophageal reflux disease)    HTN (hypertension)    Hypothyroidism    Obesity    PAF (paroxysmal atrial fibrillation) (HCC)    PONV (postoperative nausea and vomiting)    Stroke (HCC) 08/20/2014   some memory loss.      Past Surgical History:  Procedure Laterality Date   ABDOMINAL HYSTERECTOMY  15 yrs ago   with bso at aph   CARDIAC CATHETERIZATION  2004   normal   CATARACT EXTRACTION W/PHACO Right 11/10/2019   Procedure: CATARACT EXTRACTION PHACO AND INTRAOCULAR LENS PLACEMENT RIGHT EYE;  Surgeon: [name], MD;  Location: [facility];  Service: Ophthalmology;  Laterality: Right;  CDE: 46.63   CATARACT  EXTRACTION W/PHACO Left 11/24/2019   Procedure: CATARACT EXTRACTION PHACO AND INTRAOCULAR LENS PLACEMENT (IOC) CDE: 7.95;  Surgeon: [name], MD;  Location: [facility];  Service: Ophthalmology;  Laterality: Left;   CHOLECYSTECTOMY  25 yrs ago   [facility]   COLONOSCOPY  05/04/11   left-sided colonic diverticulosis   COLONOSCOPY N/A 01/27/2016   diverticulosis in sigmoid colon and descending colon, active bleeding Dieulafoy lesion s/p clips X 3, next colonoscopy in 5 years due to family history of colon cancer     ESOPHAGOGASTRODUODENOSCOPY  05/04/2011   Abnormal distal esophagus, biopsies consistent with inflammation due to acid reflux. No evidence of Barrett's. No H. pylori   ESOPHAGOGASTRODUODENOSCOPY N/A 01/22/2016   erosive gastropathy, non-bleeding gastric ulcers and 1 oozing gastric ulcer with adherent clot s/p heater probe. Negative H.pylori serology.    ESOPHAGOGASTRODUODENOSCOPY (EGD) WITH PROPOFOL N/A 04/27/2016   Dr. Jena Gauss: healed ulcers. normal esophagus   RADIOLOGY WITH ANESTHESIA N/A 08/20/2015   Procedure:  RADIOLOGY WITH ANESTHESIA;  Surgeon: Julieanne Cotton, MD;  Location: [facility];  Service: Radiology;  Laterality: N/A;      Social History:     Social History   Tobacco Use   Smoking status: Former    Packs/day: 0.25    Years: 15.00    Total pack years: 3.75    Types: Cigarettes    Start date: 07/17/1969    Quit date: 07/18/1971    Years since quitting: 50.7   Smokeless tobacco: Former    Types: Snuff    Quit date: 08/20/2014   Tobacco comments:    never more than 1-2 cig/day in entire life  Substance Use Topics   Alcohol use: No    Alcohol/week: 0.0 standard drinks of alcohol      Family History :     Family History  Problem Relation Age of Onset   Colon cancer Paternal Aunt        2, colon cancer   Colon cancer Paternal Uncle        6, colon cancer   Colon cancer Father        deceased, age 3   Colon cancer Other        numerous cousins died before age 1 with colon cancer   Colon cancer Sister        deceased, age 77   Anesthesia problems Neg Hx    Hypotension Neg Hx    Malignant hyperthermia Neg Hx    Pseudochol deficiency Neg Hx       Home Medications:   Prior to Admission medications   Medication Sig Start Date End Date Taking? Authorizing Provider  acetaminophen (TYLENOL) 325 MG tablet Take 2 tablets (650 mg total) by mouth every 6 (six) hours as needed for mild pain, moderate pain or headache. Patient not taking: Reported on 03/30/2022 11/19/17   Cleora Fleet, MD  apixaban (ELIQUIS) 5 MG TABS tablet Take 1 tablet (5 mg total) by mouth 2 (two) times daily. Restart on 7/14 Patient taking differently: Take 5 mg by mouth daily. 01/27/16   Erick Blinks, MD  atorvastatin (LIPITOR) 80 MG tablet Take 80 mg by mouth daily.    [provider]  Cholecalciferol (VITAMIN D-3 PO) Take 2,000 Units by mouth daily.     [provider]  clonazePAM (KLONOPIN) 1 MG tablet Take 1 mg by mouth 2 (two) times daily as needed for anxiety.    [provider]  Cyanocobalamin (B-12 PO) Take by mouth daily.  [provider]  enalapril (VASOTEC) 10 MG tablet Take 10 mg by mouth daily. 03/22/22   [provider]  enalapril (VASOTEC) 20 MG tablet Take 20 mg by mouth daily.    [provider]  escitalopram (LEXAPRO) 10 MG tablet Take 1 tablet (10 mg total) by mouth daily. 11/19/17   Johnson, Clanford L, MD  glipiZIDE (GLUCOTROL) 5 MG tablet Take 5 mg by mouth every morning. 07/15/20   [provider]  hydrochlorothiazide (HYDRODIURIL) 25 MG tablet Take 25 mg by mouth daily.    [provider]  HYDROcodone-acetaminophen (NORCO/VICODIN) 5-325 MG tablet Take 1 tablet by mouth every 4 (four) hours as needed for moderate pain. 09/07/21   Oliver Barre, MD  JARDIANCE 10 MG TABS tablet Take 10 mg by mouth every morning. 03/22/22   [provider]  levothyroxine (SYNTHROID, LEVOTHROID) 25 MCG tablet Take 25 mcg by mouth daily. 07/14/15   [provider]  metFORMIN (GLUCOPHAGE) 500 MG tablet Take 1,000 mg by mouth 2 (two) times daily. 03/22/22   [provider]  metFORMIN (GLUCOPHAGE-XR) 500 MG 24 hr tablet Take 1,000 mg by mouth 2 (two) times daily.    [provider]  metoprolol succinate (TOPROL-XL) 50 MG 24 hr tablet Take 1 tablet (50 mg total) by mouth daily with lunch. 12/22/17   Johnson, Clanford L, MD  oxyCODONE-acetaminophen (PERCOCET/ROXICET) 5-325 MG tablet Take 1 tablet by mouth 3 (three) times daily. 03/08/22   [provider]  pantoprazole (PROTONIX) 40 MG tablet Take 40 mg by mouth daily.    [provider]  pioglitazone (ACTOS) 15 MG tablet Take 15 mg by mouth daily.    [provider]  predniSONE (DELTASONE) 5 MG tablet Take 5 mg by mouth daily.    [provider]  traMADol (ULTRAM) 50 MG tablet Take 1 tablet (50 mg total) by mouth every 6 (six) hours as needed. 06/01/21   Mardella Layman, MD     Allergies:     Allergies   Allergen Reactions   Naproxen Other (See Comments)    Causes stomach burning     Physical Exam:   Vitals  Blood pressure (!) 106/59, pulse 72, temperature 99.5 F (37.5 C), resp. rate 16, height  (1.6 m), weight 89.4 kg, SpO2 100 %.   1. General frail, chronically ill-appearing female, laying in bed in no apparent distress  2.  Used, impaired judgment and insight, oriented x2    3. No F.N deficits, ALL C.Nerves Intact,  Sensation intact all 4 extremities, Plantars down going.  4. Ears and Eyes appear Normal, Conjunctivae clear, PERRLA.  Dry oral Mucosa.  5. Supple Neck, No JVD, No cervical lymphadenopathy appriciated, No Carotid Bruits.  6. Symmetrical Chest wall movement, Good air movement bilaterally, CTAB.  7. RRR, No Gallops, Rubs or Murmurs, No Parasternal Heave.  8. Positive Bowel Sounds, Abdomen Soft, lower abdomen mild tenderness to palpation, No organomegaly appriciated,No rebound -guarding or rigidity.  9.  No Cyanosis, Normal Skin Turgor, No Skin Rash or Bruise.  10. Good muscle tone,  joints appear normal , no effusions, Normal ROM.  11. No Palpable Lymph Nodes in Neck or Axillae    Data Review:    CBC Recent Labs  Lab 03/30/22 1340  WBC 8.9  HGB 10.6*  HCT 35.2*  PLT 177  MCV 102.6*  MCH 30.9  MCHC 30.1  RDW 14.2  LYMPHSABS 2.0  MONOABS 0.6  EOSABS 0.2  BASOSABS 0.0   ------------------------------------------------------------------------------------------------------------------  Chemistries  Recent Labs  Lab 03/30/22 1340  NA 134*  K 4.3  CL 102  CO2 22  GLUCOSE 136*  BUN 28*  CREATININE 3.69*  CALCIUM 8.2*  AST 30  ALT 15  ALKPHOS 121  BILITOT 0.6   ------------------------------------------------------------------------------------------------------------------ estimated creatinine clearance is 14.8 mL/min (A) (by C-G formula based on SCr of 3.69 mg/dL  (H)). ------------------------------------------------------------------------------------------------------------------ No results for input(s): "TSH", "T4TOTAL", "T3FREE", "THYROIDAB" in the last 72 hours.  Invalid input(s): "FREET3"  Coagulation profile Recent Labs  Lab 03/30/22 1340  INR 1.1   ------------------------------------------------------------------------------------------------------------------- No results for input(s): "DDIMER" in the last 72 hours. -------------------------------------------------------------------------------------------------------------------  Cardiac Enzymes No results for input(s): "CKMB", "TROPONINI", "MYOGLOBIN" in the last 168 hours.  Invalid input(s): "CK" ------------------------------------------------------------------------------------------------------------------    Component Value Date/Time   BNP 476.0 (H) 09/14/2017 1302     ---------------------------------------------------------------------------------------------------------------  Urinalysis    Component Value Date/Time   COLORURINE YELLOW 03/30/2022 1401   APPEARANCEUR CLOUDY (A) 03/30/2022 1401   LABSPEC 1.020 03/30/2022 1401   PHURINE 5.0 03/30/2022 1401   GLUCOSEU 50 (A) 03/30/2022 1401   HGBUR MODERATE (A) 03/30/2022 1401   BILIRUBINUR NEGATIVE 03/30/2022 1401   KETONESUR NEGATIVE 03/30/2022 1401   PROTEINUR 100 (A) 03/30/2022 1401   UROBILINOGEN 0.2 03/08/2015 0740   NITRITE NEGATIVE 03/30/2022 1401   LEUKOCYTESUR MODERATE (A) 03/30/2022 1401    ----------------------------------------------------------------------------------------------------------------   Imaging Results:    CT ABDOMEN PELVIS WO CONTRAST  Result Date: 03/30/2022 CLINICAL DATA:  Hypotension with abdomen pain EXAM: CT ABDOMEN AND PELVIS WITHOUT CONTRAST TECHNIQUE: Multidetector CT imaging of the abdomen and pelvis was performed following the standard protocol without IV contrast.  RADIATION DOSE REDUCTION: This exam was performed according to the departmental dose-optimization program which includes automated exposure control, adjustment of the mA and/or kV according to patient size and/or use of iterative reconstruction technique. COMPARISON:  CT 03/08/2015, lumbar radiograph 04/25/2017, 05/04/2020, CT 05/04/2020 FINDINGS: Lower chest: Lung bases demonstrate linear scarring or atelectasis at the bases. No acute airspace disease. Cardiomegaly. Hepatobiliary: Status post cholecystectomy. No focal hepatic abnormality. Common bile duct measures up to 10 mm. Hazy edematous appearance at the porta hepatis with possible periportal hypodensity though limited without contrast. Pancreas: Atrophic.  No inflammation Spleen: Normal in size without focal abnormality. Adrenals/Urinary Tract: Stable bilateral adrenal gland nodules measuring 14 mm on the left and 14 mm on the right, consistent with adenoma, no specific imaging follow-up is recommended. Nonspecific perinephric fat stranding. Probable cortical atrophy of kidneys. No hydronephrosis. Redundant catheter tubing in the bladder. The bladder is thick walled with stranding. Stomach/Bowel: The stomach is nonenlarged. Small duodenal diverticulum. There is no dilated small bowel. Negative appendix. Diverticular disease of the left colon. Mild fat stranding at the sigmoid colon, series 2, image 66 without substantial wall thickening. Vascular/Lymphatic: Moderate severe aortic atherosclerosis. No aneurysm. No suspicious lymph nodes Reproductive: Status post hysterectomy. No adnexal masses. Other: Negative for pelvic effusion or free air. Small fat containing periumbilical hernia. Musculoskeletal: Moderate compression fractures T10 and T12, probably chronic. IMPRESSION: 1. No hydronephrosis. Decompressed urinary bladder appears thick walled with perivesical stranding, suspicious for cystitis. Nonspecific moderate perinephric fat stranding 2. Left colon  diverticular disease with mild fat stranding adjacent to a few sigmoid colon diverticula suggestive of mild diverticulitis. No significant wall thickening. 3. Status post cholecystectomy with mildly prominent common bile duct likely due to surgical change but correlate with appropriate laboratory values. Electronically Signed   By: Jasmine Pang M.D.   On: 03/30/2022 16:47  DG Ankle Complete Right  Result Date: 03/30/2022 CLINICAL DATA:  Right lateral ankle swelling. EXAM: RIGHT ANKLE - COMPLETE 3+ VIEW COMPARISON:  None Available. FINDINGS: There is no evidence of fracture, dislocation, or joint effusion. There is no evidence of arthropathy or other focal bone abnormality. Soft tissues swelling about the right ankle. IMPRESSION: 1. No acute fracture or dislocation identified about the right ankle. 2. Soft tissue swelling about the right ankle. Electronically Signed   By: Ted Mcalpine M.D.   On: 03/30/2022 14:25   DG Chest Port 1 View  Result Date: 03/30/2022 CLINICAL DATA:  Possible sepsis EXAM: PORTABLE CHEST 1 VIEW COMPARISON:  06/01/2021 FINDINGS: Mild bilateral interstitial thickening. No focal consolidation. No pleural effusion or pneumothorax. Stable cardiomegaly. No acute osseous abnormality. IMPRESSION: 1. Cardiomegaly with mild pulmonary vascular congestion. Electronically Signed   By: Elige Ko M.D.   On: 03/30/2022 14:24    My personal review of EKG: Rhythm NSR, Rate 68/min, QTc 452, no Acute ST changes   Assessment & Plan:    Principal Problem:   Sepsis (HCC) Active Problems:   GERD (gastroesophageal reflux disease)   H/O: CVA (cerebrovascular accident)   Chronic pain   DM type 2 (diabetes mellitus, type 2) (HCC)   Benign essential HTN   Anxiety and depression   Atrial fibrillation, chronic (HCC)   Hypotension   AKI (acute kidney injury) (HCC)  Septic shock Acute diverticulitis UTI with cystitis -Septic shock present on admission, patient is hypotensive, poor  response to fluid bolus despite 4 L, she started on Levophed for pressor support -Continue with IV Rocephin and Flagyl as source of septic shock plated to acute diverticulitis and UTI with cystitis -Continue with pressors Levophed for her peripheral IV protocol, if requiring increased pressor support then will request PICC line -Follow-up blood cultures -Follow on urine cultures -Patient on chronic steroid, so we will start on IV Solu-Medrol, stress dose steroids -Hold all antihypertensive medications, and pain medications till blood pressure improves  AKI  -Creatinine 3.6 so far appears to be oliguric, Foley catheter inserted, continue with IV fluids, avoid nephrotoxic medication, if no improvement will proceed with renal ultrasound   Atrial fibrillation  -Continue with apixaban  -He is currently in normal sinus rhythm, will hold metoprolol given hypotension, if needed will start on amiodarone drip  Diabetes mellitus -Given worsening renal function, and septic shock, will hold oral hypoglycemic agent and will keep an insulin sliding scale during hospital stay  Hypothyroidism -Continue with Synthroid  Chronic pain syndrome -We will hold narcotics for now until blood pressure improves  History of CVA in the past -Continue with Eliquis  Anxiety/depression  - She is on scheduled clonazepam, will resume at a lower dose to prevent withdrawals  Chronic steroid use -Does appear patient on chronic prednisone 5 mg oral daily due to osteoarthritis, given critical illness she will be started on IV steroids    DVT Prophylaxis Eliquis  AM Labs Ordered, also please review Full Orders  Family Communication: Admission, patients condition and plan of care including tests being ordered have been discussed with the patient and husband who indicate understanding and agree with the plan and Code Status.  Code Status Full  Likely DC to  home  Condition GUARDED    Consults called: none     Admission status: inpatient    Time spent in minutes : 60 minutes critical care  The patient is critically ill with multi-organ failure.  Critical care was necessary to treat  or prevent imminent or life-threatening deterioration of septic shock sepsis,  renal failure and was exclusive of separately billable procedures and treating other patients. Total critical care time spent by me: 60 minutes Time spent personally by me on obtaining history from patient or surrogate, evaluation of the patient, evaluation of patient's response to treatment, ordering and review of laboratory studies, ordering and review of radiographic studies, ordering and performing treatments and interventions, and re-evaluation of the patient's condition.    Huey Bienenstockawood Tishawna Larouche M.D on 03/30/2022 at 5:51 PM   [facility] - Office  [phone]

## 2022-03-30 NOTE — ED Triage Notes (Signed)
Pt brought in by [facility] for c/o hypotension and abdominal pain; pt has been unable to urinate x one day per family; pt has been antibiotics for a recent kidney infection;   Pt states she fell while outside and is having pain and swelling to right ankle  Pt received boluse of NS en route by [facility]s

## 2022-03-30 NOTE — ED Notes (Signed)
[facility] called to page critical care at this time. 

## 2022-03-30 NOTE — ED Notes (Signed)
Report given to Stephanie RN.

## 2022-03-30 NOTE — ED Notes (Signed)
One set of blood cultures obtained, unable to obtain second set before antibiotic given

## 2022-03-30 NOTE — ED Provider Notes (Signed)
[facility] EMERGENCY DEPARTMENT Provider Note   CSN: [id number] Arrival date & time: 03/30/22  1307     History {Add pertinent medical, surgical, social history, OB history to HPI:1} Chief Complaint  Patient presents with   Abdominal Pain    Megan [name] is a 71 y.o. female.  Patient has a history of diabetes hypertension and obesity.  She was seen by primary care doctor a couple days ago and started on some antibiotics for UTI.  She has not been drinking very much and presents with weakness.  Questionable abdominal discomfort  The history is provided by the patient and medical records. No language interpreter was used.  Abdominal Pain Pain location:  Generalized Pain quality: aching   Pain radiates to:  Does not radiate Pain severity:  Mild Onset quality:  Sudden Timing:  Intermittent Progression:  Waxing and waning Chronicity:  New Context: not alcohol use   Associated symptoms: no chest pain, no cough, no diarrhea, no fatigue and no hematuria        Home Medications Prior to Admission medications   Medication Sig Start Date End Date Taking? Authorizing Provider  acetaminophen (TYLENOL) 325 MG tablet Take 2 tablets (650 mg total) by mouth every 6 (six) hours as needed for mild pain, moderate pain or headache. 11/19/17   Wynetta Emery, Clanford L, MD  apixaban (ELIQUIS) 5 MG TABS tablet Take 1 tablet (5 mg total) by mouth 2 (two) times daily. Restart on 7/14 Patient taking differently: Take 5 mg by mouth daily. 01/27/16   Kathie Dike, MD  atorvastatin (LIPITOR) 80 MG tablet Take 80 mg by mouth daily.    [provider]  Cholecalciferol (VITAMIN D-3 PO) Take 2,000 Units by mouth daily.     [provider]  clonazePAM (KLONOPIN) 1 MG tablet Take 1 mg by mouth 2 (two) times daily as needed for anxiety.    [provider]  Cyanocobalamin (B-12 PO) Take by mouth daily.    [provider]  enalapril (VASOTEC) 20 MG tablet Take 20 mg by  mouth daily.    [provider]  escitalopram (LEXAPRO) 10 MG tablet Take 1 tablet (10 mg total) by mouth daily. 11/19/17   Johnson, Clanford L, MD  glipiZIDE (GLUCOTROL) 5 MG tablet Take 5 mg by mouth every morning. 07/15/20   [provider]  hydrochlorothiazide (HYDRODIURIL) 25 MG tablet Take 25 mg by mouth daily.    [provider]  HYDROcodone-acetaminophen (NORCO/VICODIN) 5-325 MG tablet Take 1 tablet by mouth every 4 (four) hours as needed for moderate pain. 09/07/21   Mordecai Rasmussen, MD  levothyroxine (SYNTHROID, LEVOTHROID) 25 MCG tablet Take 25 mcg by mouth daily. 07/14/15   [provider]  metFORMIN (GLUCOPHAGE-XR) 500 MG 24 hr tablet Take 1,000 mg by mouth 2 (two) times daily.    [provider]  metoprolol succinate (TOPROL-XL) 50 MG 24 hr tablet Take 1 tablet (50 mg total) by mouth daily with lunch. 12/22/17   Johnson, Clanford L, MD  pantoprazole (PROTONIX) 40 MG tablet Take 40 mg by mouth daily.    [provider]  pioglitazone (ACTOS) 15 MG tablet Take 15 mg by mouth daily.    [provider]  predniSONE (DELTASONE) 5 MG tablet Take 5 mg by mouth daily.    [provider]  traMADol (ULTRAM) 50 MG tablet Take 1 tablet (50 mg total) by mouth every 6 (six) hours as needed. 06/01/21   Vanessa Kick, MD  Allergies    Naproxen    Review of Systems   Review of Systems  Constitutional:  Negative for appetite change and fatigue.  HENT:  Negative for congestion, ear discharge and sinus pressure.   Eyes:  Negative for discharge.  Respiratory:  Negative for cough.   Cardiovascular:  Negative for chest pain.  Gastrointestinal:  Positive for abdominal pain. Negative for diarrhea.  Genitourinary:  Negative for frequency and hematuria.  Musculoskeletal:  Negative for back pain.  Skin:  Negative for rash.  Neurological:  Negative for seizures and headaches.  Psychiatric/Behavioral:  Negative for hallucinations.      Physical Exam Updated Vital Signs BP (!) 87/56   Pulse 67   Temp 99.3 F (37.4 C)   Resp 14   Ht 5\' 3"  (1.6 m)   Wt 89.4 kg   SpO2 100%   BMI 34.90 kg/m  Physical Exam Vitals and nursing note reviewed.  Constitutional:      Appearance: She is well-developed.  HENT:     Head: Normocephalic.     Mouth/Throat:     Mouth: Mucous membranes are moist.  Eyes:     General: No scleral icterus.    Conjunctiva/sclera: Conjunctivae normal.  Neck:     Thyroid: No thyromegaly.  Cardiovascular:     Rate and Rhythm: Normal rate and regular rhythm.     Heart sounds: No murmur heard.    No friction rub. No gallop.  Pulmonary:     Breath sounds: No stridor. No wheezing or rales.  Chest:     Chest wall: No tenderness.  Abdominal:     General: There is no distension.     Tenderness: There is no abdominal tenderness. There is no rebound.  Musculoskeletal:        General: Normal range of motion.     Cervical back: Neck supple.  Lymphadenopathy:     Cervical: No cervical adenopathy.  Skin:    Findings: No erythema or rash.  Neurological:     Mental Status: She is alert and oriented to person, place, and time.     Motor: No abnormal muscle tone.     Coordination: Coordination normal.  Psychiatric:        Behavior: Behavior normal.     ED Results / Procedures / Treatments   Labs (all labs ordered are listed, but only abnormal results are displayed) Labs Reviewed  COMPREHENSIVE METABOLIC PANEL - Abnormal; Notable for the following components:      Result Value   Sodium 134 (*)    Glucose, Bld 136 (*)    BUN 28 (*)    Creatinine, Ser 3.69 (*)    Calcium 8.2 (*)    Albumin 3.1 (*)    GFR, Estimated 13 (*)    All other components within normal limits  CULTURE, BLOOD (ROUTINE X 2)  CULTURE, BLOOD (ROUTINE X 2)  LACTIC ACID, PLASMA  LACTIC ACID, PLASMA  CBC WITH DIFFERENTIAL/PLATELET  PROTIME-INR  URINALYSIS, ROUTINE W REFLEX MICROSCOPIC  OCCULT BLOOD X 1 CARD TO LAB,  STOOL  POC OCCULT BLOOD, ED    EKG None  Radiology DG Ankle Complete Right  Result Date: 03/30/2022 CLINICAL DATA:  Right lateral ankle swelling. EXAM: RIGHT ANKLE - COMPLETE 3+ VIEW COMPARISON:  None Available. FINDINGS: There is no evidence of fracture, dislocation, or joint effusion. There is no evidence of arthropathy or other focal bone abnormality. Soft tissues swelling about the right ankle. IMPRESSION: 1. No acute fracture or dislocation identified about  the right ankle. 2. Soft tissue swelling about the right ankle. Electronically Signed   By: Ted Mcalpine M.D.   On: 03/30/2022 14:25   DG Chest Port 1 View  Result Date: 03/30/2022 CLINICAL DATA:  Possible sepsis EXAM: PORTABLE CHEST 1 VIEW COMPARISON:  06/01/2021 FINDINGS: Mild bilateral interstitial thickening. No focal consolidation. No pleural effusion or pneumothorax. Stable cardiomegaly. No acute osseous abnormality. IMPRESSION: 1. Cardiomegaly with mild pulmonary vascular congestion. Electronically Signed   By: Elige Ko M.D.   On: 03/30/2022 14:24    Procedures Procedures  {Document cardiac monitor, telemetry assessment procedure when appropriate:1}  Medications Ordered in ED Medications  sodium chloride 0.9 % bolus 3,000 mL (3,000 mLs Intravenous New Bag/Given 03/30/22 1425)  cefTRIAXone (ROCEPHIN) 2 g in sodium chloride 0.9 % 100 mL IVPB (2 g Intravenous New Bag/Given 03/30/22 1439)    ED Course/ Medical Decision Making/ A&P  CRITICAL CARE Performed by: [name] Total critical care time: 40 minutes Critical care time was exclusive of separately billable procedures and treating other patients. Critical care was necessary to treat or prevent imminent or life-threatening deterioration. Critical care was time spent personally by me on the following activities: development of treatment plan with patient and/or surrogate as well as nursing, discussions with consultants, evaluation of patient's response to  treatment, examination of patient, obtaining history from patient or surrogate, ordering and performing treatments and interventions, ordering and review of laboratory studies, ordering and review of radiographic studies, pulse oximetry and re-evaluation of patient's condition.    Patient became hypoxic and hypotensive.  She has been given more saline I believe the hypoxia is related to poor readings on the pulse ox.  We are going to consult critical care                         Medical Decision Making Amount and/or Complexity of Data Reviewed Labs: ordered. Radiology: ordered. ECG/medicine tests: ordered.   Patient with hypotension and weakness.  {Document critical care time when appropriate:1} {Document review of labs and clinical decision tools ie heart score, Chads2Vasc2 etc:1}  {Document your independent review of radiology images, and any outside records:1} {Document your discussion with family members, caretakers, and with consultants:1} {Document social determinants of health affecting pt's care:1} {Document your decision making why or why not admission, treatments were needed:1} Final Clinical Impression(s) / ED Diagnoses Final diagnoses:  None    Rx / DC Orders ED Discharge Orders     None

## 2022-03-30 NOTE — ED Notes (Signed)
Hospitalist in to see pt.

## 2022-03-31 ENCOUNTER — Inpatient Hospital Stay (HOSPITAL_COMMUNITY): Payer: Medicare Other

## 2022-03-31 DIAGNOSIS — F419 Anxiety disorder, unspecified: Secondary | ICD-10-CM | POA: Diagnosis not present

## 2022-03-31 DIAGNOSIS — E1169 Type 2 diabetes mellitus with other specified complication: Secondary | ICD-10-CM

## 2022-03-31 DIAGNOSIS — I959 Hypotension, unspecified: Secondary | ICD-10-CM

## 2022-03-31 DIAGNOSIS — Z8673 Personal history of transient ischemic attack (TIA), and cerebral infarction without residual deficits: Secondary | ICD-10-CM

## 2022-03-31 DIAGNOSIS — I482 Chronic atrial fibrillation, unspecified: Secondary | ICD-10-CM | POA: Diagnosis not present

## 2022-03-31 DIAGNOSIS — N179 Acute kidney failure, unspecified: Secondary | ICD-10-CM | POA: Diagnosis not present

## 2022-03-31 DIAGNOSIS — A419 Sepsis, unspecified organism: Secondary | ICD-10-CM | POA: Diagnosis not present

## 2022-03-31 DIAGNOSIS — I10 Essential (primary) hypertension: Secondary | ICD-10-CM

## 2022-03-31 LAB — CBC
HCT: 33.9 % — ABNORMAL LOW (ref 36.0–46.0)
Hemoglobin: 10.4 g/dL — ABNORMAL LOW (ref 12.0–15.0)
MCH: 31.1 pg (ref 26.0–34.0)
MCHC: 30.7 g/dL (ref 30.0–36.0)
MCV: 101.5 fL — ABNORMAL HIGH (ref 80.0–100.0)
Platelets: 180 10*3/uL (ref 150–400)
RBC: 3.34 MIL/uL — ABNORMAL LOW (ref 3.87–5.11)
RDW: 13.9 % (ref 11.5–15.5)
WBC: 6.7 10*3/uL (ref 4.0–10.5)
nRBC: 0 % (ref 0.0–0.2)

## 2022-03-31 LAB — BASIC METABOLIC PANEL
Anion gap: 10 (ref 5–15)
BUN: 29 mg/dL — ABNORMAL HIGH (ref 8–23)
CO2: 16 mmol/L — ABNORMAL LOW (ref 22–32)
Calcium: 8 mg/dL — ABNORMAL LOW (ref 8.9–10.3)
Chloride: 108 mmol/L (ref 98–111)
Creatinine, Ser: 2.53 mg/dL — ABNORMAL HIGH (ref 0.44–1.00)
GFR, Estimated: 20 mL/min — ABNORMAL LOW (ref >60)
Glucose, Bld: 181 mg/dL — ABNORMAL HIGH (ref 70–99)
Potassium: 4.8 mmol/L (ref 3.5–5.1)
Sodium: 134 mmol/L — ABNORMAL LOW (ref 135–145)

## 2022-03-31 LAB — GLUCOSE, CAPILLARY
Glucose-Capillary: 191 mg/dL — ABNORMAL HIGH (ref 70–99)
Glucose-Capillary: 172 mg/dL — ABNORMAL HIGH (ref 70–99)
Glucose-Capillary: 204 mg/dL — ABNORMAL HIGH (ref 70–99)
Glucose-Capillary: 252 mg/dL — ABNORMAL HIGH (ref 70–99)

## 2022-03-31 LAB — URINE CULTURE: Culture: NO GROWTH

## 2022-03-31 LAB — HEMOGLOBIN A1C
Hgb A1c MFr Bld: 6.8 % — ABNORMAL HIGH (ref 4.8–5.6)
Mean Plasma Glucose: 148.46 mg/dL

## 2022-03-31 MED ORDER — METHYLPREDNISOLONE SODIUM SUCC 125 MG IJ SOLR: 60.0000 mg | Freq: Every day | INTRAMUSCULAR | Status: DC

## 2022-03-31 MED ORDER — CHLORHEXIDINE GLUCONATE CLOTH 2 % EX PADS
6.0000 | MEDICATED_PAD | Freq: Every day | CUTANEOUS | Status: DC
  Administered 2022-03-31 – 2022-04-02 (×3): 6 via TOPICAL

## 2022-03-31 MED ORDER — LACTATED RINGERS IV SOLN: INTRAVENOUS | Status: DC

## 2022-03-31 MED ORDER — LACTATED RINGERS IV SOLN: INTRAVENOUS | Status: AC

## 2022-03-31 MED ORDER — HYDROMORPHONE HCL 1 MG/ML IJ SOLN
0.5000 mg | INTRAMUSCULAR | Status: DC | PRN
  Administered 2022-03-31: 0.5 mg via INTRAVENOUS
  Filled 2022-03-31: qty 0.5

## 2022-03-31 MED ORDER — OXYCODONE HCL 5 MG PO TABS
5.0000 mg | ORAL_TABLET | ORAL | Status: DC | PRN
  Administered 2022-03-31 – 2022-04-02 (×3): 5 mg via ORAL
  Filled 2022-03-31 (×3): qty 1

## 2022-03-31 MED ORDER — SODIUM CHLORIDE 0.45 % IV SOLN
INTRAVENOUS | Status: DC
  Filled 2022-03-31 (×3): qty 75

## 2022-03-31 NOTE — Progress Notes (Signed)
  Transition of Care [facility]) Screening Note   Patient Details  Name: Megan [name] Date of Birth: April 07, 1951   Transition of Care [facility]) CM/SW Contact:    Villa Herb, LCSWA Phone Number: 03/31/2022, 10:38 AM    Transition of Care Department [facility]) has reviewed patient and no TOC needs have been identified at this time. We will continue to monitor patient advancement through interdisciplinary progression rounds. If new patient transition needs arise, please place a TOC consult.

## 2022-03-31 NOTE — Hospital Course (Addendum)
Megan [name]  is a 71 y.o. 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  [facility]. -She presents to ED secondary to complaints of abdominal pain, she was seen by her PCP couple days ago, and started on some antibiotics for UTI, husband at bedside assist with the history, patient with very poor oral intake, fluid intake, and lysed weakness, fatigue, she does report abdominal pain, she cannot specify dysuria or polyuria, she denies fever, chills, does report nausea, denies any vomiting. -In emergency department her work-up was significant for low blood pressure, which responded initially to IV fluids, she was noted to be with a new AKI, creatinine of 3.69, from baseline of 1.28 in 2021, bladder scan showing post residual volume of 0, Foley catheter was inserted, she was hypotensive due to septic shock, work-up significant for acute diverticulitis, and cystitis on imaging, she had pyuria, she started on Rocephin and Flagyl, she did require Levophed for pressure support, as well she did receive IV Solu-Medrol given she is steroid dependent.        Assessment & Plan:     Principal Problem:   Sepsis (HCC) Active Problems:   GERD (gastroesophageal reflux disease)   H/O: CVA (cerebrovascular accident)   Chronic pain   DM type 2 (diabetes mellitus, type 2) (HCC)   Benign essential HTN   Anxiety and depression   Atrial fibrillation, chronic (HCC)   Hypotension   AKI (acute kidney injury) (HCC)    Septic shock/ Acute diverticulitis/ UTI with cystitis Stable this Am - Still on Levo.  -Planning to taper off Levophed as BP improving  Blood pressure (!) 110/52, pulse 72, temp,99 F (37.2 C), t, resp. rate 13, weight 89.4 kg, SpO2 99 %.  -Septic shock present on admission, patient is  hypotensive, poor response to fluid bolus despite 4 L, she started on Levophed for pressor support  -Continue with IV Rocephin and Flagyl  -Source of infection/septic shock plated to acute diverticulitis and UTI with cystitis  -Follow-up blood cultures >> -Follow on urine cultures >>   -Patient on chronic steroid, so we will start on IV Solu-Medrol, stress dose steroids  -Continue to hold antihypertensive medications    AKI  -Improving -Creatinine 3.6 so far appears to be oliguric,  -We will continue with Foley catheter inserted,  - Continue with IV fluids, avoid nephrotoxic medication, if no improvement will proceed with renal ultrasound  Lab Results  Component Value Date   CREATININE 2.53 (H) 03/31/2022   CREATININE 3.69 (H) 03/30/2022   CREATININE 1.28 (H) 11/06/2019       Atrial fibrillation  -Continue with apixaban  -He is currently in normal sinus rhythm, will hold metoprolol given hypotension, if needed will start on amiodarone drip   Diabetes mellitus -Holding oral hypoglycemic agents -Checking CBG QA CHS, with Isai coverage -A1c 6.8,    Hypothyroidism -Continue with Synthroid -TSH 1.90, free T41.06,   Chronic pain syndrome -We will hold narcotics for now until blood pressure improves   History of CVA in the past -Continue with Eliquis   Anxiety/depression  - She is on scheduled clonazepam, will resume at a lower dose to prevent withdrawals   Chronic steroid use -Does appear patient on chronic prednisone 5 mg oral daily due to osteoarthritis, given critical illness she will be started on IV steroids

## 2022-03-31 NOTE — Progress Notes (Addendum)
PROGRESS NOTE    Patient: Megan [name]                            PCP: [name], MD                    DOB: [birth year]            DOA: 03/30/2022 ZMO:[id number]             DOS: 03/31/2022, 11:22 AM   LOS: 1 day   Date of Service: The patient was seen and examined on 03/31/2022  Subjective:   The patient was seen and examined this morning, stable, awake alert oriented, following commands Still on Levophed drip Hemodynamically stable    Brief Narrative:   Megan [name]  is a 71 y.o. 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  [facility]. -She presents to ED secondary to complaints of abdominal pain, she was seen by her PCP couple days ago, and started on some antibiotics for UTI, husband at bedside assist with the history, patient with very poor oral intake, fluid intake, and lysed weakness, fatigue, she does report abdominal pain, she cannot specify dysuria or polyuria, she denies fever, chills, does report nausea, denies any vomiting. -In emergency department her work-up was significant for low blood pressure, which responded initially to IV fluids, she was noted to be with a new AKI, creatinine of 3.69, from baseline of 1.28 in 2021, bladder scan showing post residual volume of 0, Foley catheter was inserted, she was hypotensive due to septic shock, work-up significant for acute diverticulitis, and cystitis on imaging, she had pyuria, she started on Rocephin and Flagyl, she did require Levophed for pressure support, as well she did receive IV Solu-Medrol given she is steroid dependent.        Assessment & Plan:     Principal Problem:   Sepsis (HCC) Active Problems:   GERD (gastroesophageal reflux disease)   H/O: CVA (cerebrovascular accident)   Chronic pain    DM type 2 (diabetes mellitus, type 2) (HCC)   Benign essential HTN   Anxiety and depression   Atrial fibrillation, chronic (HCC)   Hypotension   AKI (acute kidney injury) (HCC)    Septic shock/ Acute diverticulitis/ UTI with cystitis Stable this Am - Still on Levo.  -Planning to taper off Levophed as BP improving  Blood pressure (!) 110/52, pulse 72, temp,99 F (37.2 C), t, resp. rate 13, weight 89.4 kg, SpO2 99 %.  -Septic shock present on admission, patient is hypotensive, poor response to fluid bolus despite 4 L, she started on Levophed for pressor support  -Continue with IV Rocephin and Flagyl  -Source of infection/septic shock plated to acute diverticulitis and UTI with cystitis  -Follow-up blood cultures >> -Follow on urine cultures >>   -Patient on chronic steroid, so we will start on IV Solu-Medrol, stress dose steroids  -Continue to hold antihypertensive medications    AKI  -Improving -Creatinine 3.6 so far appears to be oliguric,  -We will continue with Foley catheter inserted,  - Continue with IV fluids, avoid nephrotoxic medication, if no improvement will proceed with renal ultrasound  Lab Results  Component Value Date   CREATININE 2.53 (H) 03/31/2022   CREATININE 3.69 (H) 03/30/2022   CREATININE 1.28 (  H) 11/06/2019       Atrial fibrillation  -Continue with apixaban  -He is currently in normal sinus rhythm, will hold metoprolol given hypotension, if needed will start on amiodarone drip   Diabetes mellitus -Holding oral hypoglycemic agents -Checking CBG QA CHS, with Isai coverage -A1c 6.8,    Hypothyroidism -Continue with Synthroid -TSH 1.90, free T41.06,   Chronic pain syndrome -We will hold narcotics for now until blood pressure improves   History of CVA in the past -Continue with Eliquis   Anxiety/depression  - She is on scheduled clonazepam, will resume at a lower dose to prevent withdrawals   Chronic steroid use -Does appear patient on  chronic prednisone 5 mg oral daily due to osteoarthritis, given critical illness she will be started on IV steroids     ----------------------------------------------------------------------------------------------------------------------------------------------- Nutritional status:  [name] BMI is: Body mass index is 34.9 kg/m. I agree with the assessment and plan as outlined below: Nutrition Status:       -------------------------------------------------------------------------------------------------------------------------------------------- Cultures; Blood Cultures x 2 >> NGT Urine Culture  >>> NGT  -------------------------------------------------------------------------------------------------------------------------------------------  DVT prophylaxis:   apixaban (ELIQUIS) tablet 5 mg   Code Status:   Code Status: Full Code  Family Communication: Husband present at bedside, updated The above findings and plan of care has been discussed with patient (and family)  in detail,  they expressed understanding and agreement of above. -Advance care planning has been discussed.   Admission status:   Status is: Inpatient Remains inpatient appropriate because: Needing treatment for sepsis, septic shock, IV fluids, Levophed drip in ICU setting on IV antibiotics     Procedures:   No admission procedures for hospital encounter.   Antimicrobials:  Anti-infectives (From admission, onward)    Start     Dose/Rate Route Frequency Ordered Stop   03/31/22 1400  cefTRIAXone (ROCEPHIN) 2 g in sodium chloride 0.9 % 100 mL IVPB        2 g 200 mL/hr over 30 Minutes Intravenous Every 24 hours 03/30/22 2241     03/31/22 0800  metroNIDAZOLE (FLAGYL) IVPB 500 mg        500 mg 100 mL/hr over 60 Minutes Intravenous Every 12 hours 03/30/22 2241     03/30/22 1700  metroNIDAZOLE (FLAGYL) IVPB 500 mg        500 mg 100 mL/hr over 60 Minutes Intravenous STAT 03/30/22 1656 03/30/22 1822    03/30/22 1415  cefTRIAXone (ROCEPHIN) 2 g in sodium chloride 0.9 % 100 mL IVPB        2 g 200 mL/hr over 30 Minutes Intravenous  Once 03/30/22 1413 03/30/22 1719        Medication:   apixaban  5 mg Oral BID   Chlorhexidine Gluconate Cloth  6 each Topical Daily   insulin aspart  0-15 Units Subcutaneous TID WC   insulin aspart  0-5 Units Subcutaneous QHS   levothyroxine  25 mcg Oral Daily   methylPREDNISolone (SOLU-MEDROL) injection  80 mg Intravenous Daily   pantoprazole  40 mg Oral Daily    acetaminophen **OR** acetaminophen, albuterol, clonazePAM, ondansetron **OR** ondansetron (ZOFRAN) IV   Objective:   Vitals:   03/31/22 0800 03/31/22 0815 03/31/22 0900 03/31/22 1000  BP: (!) 113/58 (!) 121/53 (!) 140/69 (!) 126/49  Pulse: 65 61 69 72  Resp: 13 12 17 18   Temp:  (!) 97.1 F (36.2 C)    TempSrc:  Oral    SpO2: 99% 100% 100% 100%  Weight:  Height:        Intake/Output Summary (Last 24 hours) at 03/31/2022 1122 Last data filed at 03/30/2022 1822 Gross per 24 hour  Intake 4200 ml  Output --  Net 4200 ml   Filed Weights   03/30/22 1350  Weight: 89.4 kg     Examination:   Physical Exam  Constitution:  Alert, cooperative, no distress,  Appears calm and comfortable  Psychiatric:   Normal and stable mood and affect, cognition intact,   HEENT:        Normocephalic, PERRL, otherwise with in Normal limits  Chest:         Chest symmetric Cardio vascular:  S1/S2, RRR, No murmure, No Rubs or Gallops  pulmonary: Clear to auscultation bilaterally, respirations unlabored, negative wheezes / crackles Abdomen: Soft, non-tender, non-distended, bowel sounds,no masses, no organomegaly Muscular skeletal: Limited exam - in bed, able to move all 4 extremities,   Neuro: CNII-XII intact. , normal motor and sensation, reflexes intact  Extremities: No pitting edema lower extremities, +2 pulses  Skin: Dry, warm to touch, negative for any Rashes, No open wounds Wounds: per nursing  documentation   ------------------------------------------------------------------------------------------------------------------------------------------    LABs:     Latest Ref Rng & Units 03/31/2022    5:15 AM 03/30/2022    1:40 PM 07/16/2018   10:02 AM  CBC  WBC 4.0 - 10.5 K/uL 6.7  8.9  5.5   Hemoglobin 12.0 - 15.0 g/dL 10.4  10.6  9.2   Hematocrit 36.0 - 46.0 % 33.9  35.2  30.2   Platelets 150 - 400 K/uL 180  177  147       Latest Ref Rng & Units 03/31/2022    5:15 AM 03/30/2022    1:40 PM 11/06/2019   11:01 AM  CMP  Glucose 70 - 99 mg/dL 181  136  182   BUN 8 - 23 mg/dL 29  28  26    Creatinine 0.44 - 1.00 mg/dL 2.53  3.69  1.28   Sodium 135 - 145 mmol/L 134  134  133   Potassium 3.5 - 5.1 mmol/L 4.8  4.3  4.5   Chloride 98 - 111 mmol/L 108  102  94   CO2 22 - 32 mmol/L 16  22  28    Calcium 8.9 - 10.3 mg/dL 8.0  8.2  9.9   Total Protein 6.5 - 8.1 g/dL  7.1    Total Bilirubin 0.3 - 1.2 mg/dL  0.6    Alkaline Phos 38 - 126 U/L  121    AST 15 - 41 U/L  30    ALT 0 - 44 U/L  15         Micro Results Recent Results (from the past 240 hour(s))  Culture, blood (Routine x 2)     Status: None (Preliminary result)   Collection Time: 03/30/22  1:37 PM   Specimen: BLOOD  Result Value Ref Range Status   Specimen Description BLOOD BLOOD RIGHT ARM  Final   Special Requests   Final    BOTTLES DRAWN AEROBIC AND ANAEROBIC Blood Culture results may not be optimal due to an inadequate volume of blood received in culture bottles Performed at [facility], [address] [place], [place] [address]    Culture PENDING  Incomplete   Report Status PENDING  Incomplete  Culture, blood (Routine x 2)     Status: None (Preliminary result)   Collection Time: 03/30/22  2:59 PM   Specimen: BLOOD  Result Value  Ref Range Status   Specimen Description BLOOD BLOOD LEFT HAND  Final   Special Requests   Final    BOTTLES DRAWN AEROBIC ONLY Blood Culture adequate volume Performed at [facility], [address] [place], [place] [address]    Culture PENDING  Incomplete   Report Status PENDING  Incomplete  MRSA Next Gen by PCR, Nasal     Status: None   Collection Time: 03/30/22  6:44 PM   Specimen: Nasal Mucosa; Nasal Swab  Result Value Ref Range Status   MRSA by PCR Next Gen NOT DETECTED NOT DETECTED Final    Comment: (NOTE) The GeneXpert MRSA Assay (FDA approved for NASAL specimens only), is one component of a comprehensive MRSA colonization surveillance program. It is not intended to diagnose MRSA infection nor to guide or monitor treatment for MRSA infections. Test performance is not FDA approved in patients less than 60 years old. Performed at [facility], [address], [place], [place] [address]     Radiology Reports CT ABDOMEN PELVIS WO CONTRAST  Result Date: 03/30/2022 CLINICAL DATA:  Hypotension with abdomen pain EXAM: CT ABDOMEN AND PELVIS WITHOUT CONTRAST TECHNIQUE: Multidetector CT imaging of the abdomen and pelvis was performed following the standard protocol without IV contrast. RADIATION DOSE REDUCTION: This exam was performed according to the departmental dose-optimization program which includes automated exposure control, adjustment of the mA and/or kV according to patient size and/or use of iterative reconstruction technique. COMPARISON:  CT 03/08/2015, lumbar radiograph 04/25/2017, 05/04/2020, CT 05/04/2020 FINDINGS: Lower chest: Lung bases demonstrate linear scarring or atelectasis at the bases. No acute airspace disease. Cardiomegaly. Hepatobiliary: Status post cholecystectomy. No focal hepatic abnormality. Common bile duct measures up to 10 mm. Hazy edematous appearance at the porta hepatis with possible periportal hypodensity though limited without contrast. Pancreas: Atrophic.  No inflammation Spleen: Normal in size without focal abnormality. Adrenals/Urinary Tract: Stable bilateral adrenal gland nodules measuring 14 mm on the left and 14 mm on the right,  consistent with adenoma, no specific imaging follow-up is recommended. Nonspecific perinephric fat stranding. Probable cortical atrophy of kidneys. No hydronephrosis. Redundant catheter tubing in the bladder. The bladder is thick walled with stranding. Stomach/Bowel: The stomach is nonenlarged. Small duodenal diverticulum. There is no dilated small bowel. Negative appendix. Diverticular disease of the left colon. Mild fat stranding at the sigmoid colon, series 2, image 66 without substantial wall thickening. Vascular/Lymphatic: Moderate severe aortic atherosclerosis. No aneurysm. No suspicious lymph nodes Reproductive: Status post hysterectomy. No adnexal masses. Other: Negative for pelvic effusion or free air. Small fat containing periumbilical hernia. Musculoskeletal: Moderate compression fractures T10 and T12, probably chronic. IMPRESSION: 1. No hydronephrosis. Decompressed urinary bladder appears thick walled with perivesical stranding, suspicious for cystitis. Nonspecific moderate perinephric fat stranding 2. Left colon diverticular disease with mild fat stranding adjacent to a few sigmoid colon diverticula suggestive of mild diverticulitis. No significant wall thickening. 3. Status post cholecystectomy with mildly prominent common bile duct likely due to surgical change but correlate with appropriate laboratory values. Electronically Signed   By: Jasmine Pang M.D.   On: 03/30/2022 16:47   DG Ankle Complete Right  Result Date: 03/30/2022 CLINICAL DATA:  Right lateral ankle swelling. EXAM: RIGHT ANKLE - COMPLETE 3+ VIEW COMPARISON:  None Available. FINDINGS: There is no evidence of fracture, dislocation, or joint effusion. There is no evidence of arthropathy or other focal bone abnormality. Soft tissues swelling about the right ankle. IMPRESSION: 1. No acute fracture or dislocation identified about the right ankle.  2. Soft tissue swelling about the right ankle. Electronically Signed   By: Fidela Salisbury M.D.   On: 03/30/2022 14:25   DG Chest Port 1 View  Result Date: 03/30/2022 CLINICAL DATA:  Possible sepsis EXAM: PORTABLE CHEST 1 VIEW COMPARISON:  06/01/2021 FINDINGS: Mild bilateral interstitial thickening. No focal consolidation. No pleural effusion or pneumothorax. Stable cardiomegaly. No acute osseous abnormality. IMPRESSION: 1. Cardiomegaly with mild pulmonary vascular congestion. Electronically Signed   By: Kathreen Devoid M.D.   On: 03/30/2022 14:24    SIGNED: Deatra James, MD, FHM. > 66 minutes of critical care time was spent seeing and examining patient, reviewing all medical records, reviewing all medication, drawn plan of care, discussion with patient and ICU staff regarding medical care and current pressors   [facility],  Pager (please use [email or web address] to page/text) Please use Epic Secure Chat for non-urgent communication (7AM-7PM)  If 7PM-7AM, please contact night-coverage [email or web address], 03/31/2022, 11:22 AM

## 2022-03-31 NOTE — TOC Initial Note (Signed)
Transition of Care [facility]) - Initial/Assessment Note    Patient Details  Name: Megan [name] MRN: [id number] Date of Birth: [birth year]  [facility] CM/SW Contact:    Villa Herb, LCSWA Phone Number: 03/31/2022, 3:33 PM  Clinical Narrative:                 CSW updated that pt was recommended for SNF at D/C. CSW spoke with pt and husband in room about recommendation. Pt states that she is not agreeable to SNF at this time and will agree to outpatient PT referral. Pt states that she will be able to manage at home and is not interested in SNF services. Pt states that she has canes, walker, wheelchair, and a BSC to use when needed in the home. CSW to make OP PT referral. TOC to follow.   Expected Discharge Plan: OP Rehab Barriers to Discharge: Continued Medical Work up   Patient Goals and CMS Choice Patient states their goals for this hospitalization and ongoing recovery are:: return home CMS Medicare.gov Compare Post Acute Care list provided to:: Patient Choice offered to / list presented to : Patient  Expected Discharge Plan and Services Expected Discharge Plan: OP Rehab In-house Referral: Clinical Social Work Discharge Planning Services: CM Consult   Living arrangements for the past 2 months: Single Family Home                                      Prior Living Arrangements/Services Living arrangements for the past 2 months: Single Family Home Lives with:: Spouse Patient language and need for interpreter reviewed:: Yes Do you feel safe going back to the place where you live?: Yes      Need for Family Participation in Patient Care: Yes (Comment) Care giver support system in place?: Yes (comment) Current home services: DME (Cane, walker, wheelchair, [facility]) Criminal Activity/Legal Involvement Pertinent to Current Situation/Hospitalization: No - Comment as needed  Activities of Daily Living      Permission Sought/Granted                   Emotional Assessment Appearance:: Appears stated age Attitude/Demeanor/Rapport: Engaged Affect (typically observed): Accepting Orientation: : Oriented to Self, Oriented to Place, Oriented to  Time, Oriented to Situation Alcohol / Substance Use: Not Applicable Psych Involvement: No (comment)  Admission diagnosis:  Sepsis (HCC) [A41.9] Patient Active Problem List   Diagnosis Date Noted   Sepsis (HCC) 03/30/2022   AKI (acute kidney injury) (HCC) 03/30/2022   Septic shock (HCC) 03/30/2022   Closed fracture of navicular bone of left wrist  05/13/18 07/18/2018   Umbilical pain 05/01/2018   Toxic encephalopathy 12/21/2017   Hypotension due to drugs 12/21/2017   Confusion 12/20/2017   Hypotension 12/20/2017   UTI (urinary tract infection) 12/20/2017   Atrial fibrillation, chronic (HCC) 11/17/2017   Current use of long term anticoagulation 11/17/2017   Weakness 11/14/2017   Acute metabolic encephalopathy 11/14/2017   PUD (peptic ulcer disease) 04/07/2016   Constipation 04/07/2016   Diverticulosis of colon without hemorrhage    Rectal bleeding    BRBPR (bright red blood per rectum) 01/25/2016   Hypoglycemia 01/20/2016   Upper GI bleeding 01/20/2016   H/O: CVA (cerebrovascular accident) 01/20/2016   Chronic pain 01/20/2016   DM type 2 (diabetes mellitus, type 2) (HCC) 01/20/2016   Benign essential HTN 01/20/2016   Anxiety and depression 01/20/2016  Blood loss anemia 01/20/2016   Knee pain, acute    Cephalalgia    GERD (gastroesophageal reflux disease) 06/14/2011   FH: colon cancer 04/05/2011   History of colon polyps 04/05/2011   Left sided abdominal pain 04/05/2011   N&V (nausea and vomiting) 04/05/2011   PCP:  [name], MD Pharmacy:   [facility] [place], [place] - [address] [place] [address] Phone: [phone] Fax: [phone]  [id number] - [place], [place] - [address] [place] [address] Phone: [phone] Fax: [phone]     Social Determinants of Health (SDOH) Interventions    Readmission Risk Interventions     No data to display

## 2022-03-31 NOTE — Plan of Care (Signed)
  Problem: Acute Rehab PT Goals(only PT should resolve) Goal: Pt Will Go Supine/Side To Sit Outcome: Progressing Flowsheets (Taken 03/31/2022 1552) Pt will go Supine/Side to Sit:  with min guard assist  with minimal assist Goal: Patient Will Transfer Sit To/From Stand Outcome: Progressing Flowsheets (Taken 03/31/2022 1552) Patient will transfer sit to/from stand:  with min guard assist  with minimal assist Goal: Pt Will Transfer Bed To Chair/Chair To Bed Outcome: Progressing Flowsheets (Taken 03/31/2022 1552) Pt will Transfer Bed to Chair/Chair to Bed: with min assist Goal: Pt Will Ambulate Outcome: Progressing Flowsheets (Taken 03/31/2022 1552) Pt will Ambulate:  15 feet  with minimal assist  with moderate assist  with rolling walker   3:53 PM, 03/31/22 Ocie Bob, MPT Physical Therapist with [facility] [phone] office [phone] mobile phone

## 2022-03-31 NOTE — Evaluation (Signed)
Physical Therapy Evaluation Patient Details Name: Megan [name] MRN: [id number] DOB: March 07, 1951 Today's Date: 03/31/2022  History of Present Illness  Megan [name]  is a 71 y.o. 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  [facility].  -She presents to ED secondary to complaints of abdominal pain, she was seen by her PCP couple days ago, and started on some antibiotics for UTI, husband at bedside assist with the history, patient with very poor oral intake, fluid intake, and lysed weakness, fatigue, she does report abdominal pain, she cannot specify dysuria or polyuria, she denies fever, chills, does report nausea, denies any vomiting.  -In emergency department her work-up was significant for low blood pressure, which responded initially to IV fluids, she was noted to be with a new AKI, creatinine of 3.69, from baseline of 1.28 in 2021, bladder scan showing post residual volume of 0, Foley catheter was inserted, she was hypotensive due to septic shock, work-up significant for acute diverticulitis, and cystitis on imaging, she had pyuria, she started on Rocephin and Flagyl, she did require Levophed for pressure support, as well she did receive IV Solu-Medrol given she is steroid dependent.   Clinical Impression  Patient demonstrates slow labored movement for sitting up at bedside requiring frequent verbal/tactile cueing for proper hand placement when scooting to EOB, very unsteady on feet with near loss of balance during transfer to chair and limited to a few side steps before having to sit due to BLE weakness/fatigue.  Patient tolerated sitting up in chair after therapy - RN aware.  Patient will benefit from continued skilled physical therapy in hospital and recommended venue below to increase  strength, balance, endurance for safe ADLs and gait.          Recommendations for follow up therapy are one component of a multi-disciplinary discharge planning process, led by the attending physician.  Recommendations may be updated based on patient status, additional functional criteria and insurance authorization.  Follow Up Recommendations Skilled nursing-short term rehab (<3 hours/day) Can patient physically be transported by private vehicle: Yes    Assistance Recommended at Discharge Set up Supervision/Assistance  Patient can return home with the following  A lot of help with walking and/or transfers;A little help with bathing/dressing/bathroom;Assistance with cooking/housework;Help with stairs or ramp for entrance    Equipment Recommendations None recommended by PT  Recommendations for Other Services       Functional Status Assessment Patient has had a recent decline in their functional status and demonstrates the ability to make significant improvements in function in a reasonable and predictable amount of time.     Precautions / Restrictions Precautions Precautions: Fall Restrictions Weight Bearing Restrictions: No      Mobility  Bed Mobility Overal bed mobility: Needs Assistance Bed Mobility: Supine to Sit     Supine to sit: Min assist, Mod assist     General bed mobility comments: increased time, labored movement    Transfers Overall transfer level: Needs assistance Equipment used: Rolling walker (2 wheels) Transfers: Sit to/from Stand, Bed to chair/wheelchair/BSC Sit to Stand: Mod assist   Step pivot transfers: Mod assist       General transfer comment: unsteady labored movement    Ambulation/Gait Ambulation/Gait assistance: Mod assist Gait Distance (Feet): 5 Feet Assistive device: Rolling walker (2 wheels) Gait Pattern/deviations: Decreased step length - right, Decreased step  length - left, Decreased stride length Gait velocity: slow      General Gait Details: limited to a few steps at bedside before having to sit due to BLE weakness and poor standing balance  Stairs            Wheelchair Mobility    Modified Rankin (Stroke Patients Only)       Balance Overall balance assessment: Needs assistance Sitting-balance support: Feet supported, No upper extremity supported Sitting balance-Leahy Scale: Fair Sitting balance - Comments: fair seated at EOB   Standing balance support: During functional activity, Bilateral upper extremity supported Standing balance-Leahy Scale: Poor Standing balance comment: fair/poor using RW                             Pertinent Vitals/Pain Pain Assessment Pain Assessment: 0-10 Pain Score: 8  Pain Location: legs Pain Descriptors / Indicators: Sore Pain Intervention(s): Limited activity within patient's tolerance, Monitored during session, Repositioned    Home Living Family/patient expects to be discharged to:: Private residence Living Arrangements: Spouse/significant other Available Help at Discharge: Family;Available 24 hours/day Type of Home: Mobile home Home Access: Ramped entrance       Home Layout: One level Home Equipment: Agricultural consultant (2 wheels);Cane - single point;Grab bars - tub/shower;Shower seat      Prior Function Prior Level of Function : Needs assist       Physical Assist : Mobility (physical);ADLs (physical) Mobility (physical): Bed mobility;Transfers;Gait;Stairs   Mobility Comments: household ambulator using RW, assisted for bed mobility ADLs Comments: assisted by family     Hand Dominance   Dominant Hand: Right    Extremity/Trunk Assessment   Upper Extremity Assessment Upper Extremity Assessment: Generalized weakness    Lower Extremity Assessment Lower Extremity Assessment: Generalized weakness    Cervical / Trunk Assessment Cervical / Trunk Assessment: Normal  Communication   Communication: No difficulties  Cognition  Arousal/Alertness: Awake/alert Behavior During Therapy: WFL for tasks assessed/performed Overall Cognitive Status: Within Functional Limits for tasks assessed                                          General Comments      Exercises     Assessment/Plan    PT Assessment Patient needs continued PT services  PT Problem List Decreased strength;Decreased activity tolerance;Decreased balance;Decreased mobility       PT Treatment Interventions DME instruction;Gait training;Stair training;Functional mobility training;Therapeutic activities;Therapeutic exercise;Patient/family education;Balance training    PT Goals (Current goals can be found in the Care Plan section)  Acute Rehab PT Goals Patient Stated Goal: return home with family to assist PT Goal Formulation: With patient/family Time For Goal Achievement: 04/14/22 Potential to Achieve Goals: Good    Frequency Min 3X/week     Co-evaluation               AM-PAC PT "6 Clicks" Mobility  Outcome Measure Help needed turning from your back to your side while in a flat bed without using bedrails?: A Lot Help needed moving from lying on your back to sitting on the side of a flat bed without using bedrails?: A Lot Help needed moving to and from a bed to a chair (including a wheelchair)?: A Lot Help needed standing up from a chair using your arms (e.g., wheelchair or bedside chair)?: A Lot Help needed to walk in  hospital room?: A Lot Help needed climbing 3-5 steps with a railing? : Total 6 Click Score: 11    End of Session   Activity Tolerance: Patient tolerated treatment well;Patient limited by fatigue Patient left: in chair;with call bell/phone within reach;with family/visitor present Nurse Communication: Mobility status PT Visit Diagnosis: Unsteadiness on feet (R26.81);Other abnormalities of gait and mobility (R26.89);Muscle weakness (generalized) (M62.81)    Time: 8527-7824 PT Time Calculation (min)  (ACUTE ONLY): 27 min   Charges:   PT Evaluation $PT Eval Moderate Complexity: 1 Mod PT Treatments $Therapeutic Activity: 23-37 mins        3:51 PM, 03/31/22 Ocie Bob, MPT Physical Therapist with [facility] [phone] office [phone] mobile phone

## 2022-04-01 DIAGNOSIS — N179 Acute kidney failure, unspecified: Secondary | ICD-10-CM | POA: Diagnosis not present

## 2022-04-01 DIAGNOSIS — A419 Sepsis, unspecified organism: Secondary | ICD-10-CM | POA: Diagnosis not present

## 2022-04-01 DIAGNOSIS — F419 Anxiety disorder, unspecified: Secondary | ICD-10-CM | POA: Diagnosis not present

## 2022-04-01 DIAGNOSIS — I482 Chronic atrial fibrillation, unspecified: Secondary | ICD-10-CM | POA: Diagnosis not present

## 2022-04-01 DIAGNOSIS — G894 Chronic pain syndrome: Secondary | ICD-10-CM

## 2022-04-01 LAB — CBC
HCT: 30.7 % — ABNORMAL LOW (ref 36.0–46.0)
Hemoglobin: 9.7 g/dL — ABNORMAL LOW (ref 12.0–15.0)
MCH: 30.7 pg (ref 26.0–34.0)
MCHC: 31.6 g/dL (ref 30.0–36.0)
MCV: 97.2 fL (ref 80.0–100.0)
Platelets: 163 10*3/uL (ref 150–400)
RBC: 3.16 MIL/uL — ABNORMAL LOW (ref 3.87–5.11)
RDW: 13.6 % (ref 11.5–15.5)
WBC: 5.7 10*3/uL (ref 4.0–10.5)
nRBC: 0 % (ref 0.0–0.2)

## 2022-04-01 LAB — COMPREHENSIVE METABOLIC PANEL
ALT: 16 U/L (ref 0–44)
AST: 22 U/L (ref 15–41)
Albumin: 2.8 g/dL — ABNORMAL LOW (ref 3.5–5.0)
Alkaline Phosphatase: 117 U/L (ref 38–126)
Anion gap: 8 (ref 5–15)
BUN: 40 mg/dL — ABNORMAL HIGH (ref 8–23)
CO2: 20 mmol/L — ABNORMAL LOW (ref 22–32)
Calcium: 8.1 mg/dL — ABNORMAL LOW (ref 8.9–10.3)
Chloride: 102 mmol/L (ref 98–111)
Creatinine, Ser: 2.06 mg/dL — ABNORMAL HIGH (ref 0.44–1.00)
GFR, Estimated: 25 mL/min — ABNORMAL LOW (ref >60)
Glucose, Bld: 222 mg/dL — ABNORMAL HIGH (ref 70–99)
Potassium: 4.1 mmol/L (ref 3.5–5.1)
Sodium: 130 mmol/L — ABNORMAL LOW (ref 135–145)
Total Bilirubin: 0.2 mg/dL — ABNORMAL LOW (ref 0.3–1.2)
Total Protein: 6.6 g/dL (ref 6.5–8.1)

## 2022-04-01 LAB — GLUCOSE, CAPILLARY
Glucose-Capillary: 203 mg/dL — ABNORMAL HIGH (ref 70–99)
Glucose-Capillary: 176 mg/dL — ABNORMAL HIGH (ref 70–99)
Glucose-Capillary: 175 mg/dL — ABNORMAL HIGH (ref 70–99)
Glucose-Capillary: 214 mg/dL — ABNORMAL HIGH (ref 70–99)

## 2022-04-01 LAB — BRAIN NATRIURETIC PEPTIDE: B Natriuretic Peptide: 433 pg/mL — ABNORMAL HIGH (ref 0.0–100.0)

## 2022-04-01 MED ORDER — SODIUM CHLORIDE 0.9 % IV SOLN: INTRAVENOUS | Status: DC

## 2022-04-01 MED ORDER — CIPROFLOXACIN HCL 250 MG PO TABS
500.0000 mg | ORAL_TABLET | Freq: Every day | ORAL | Status: DC
  Administered 2022-04-01 – 2022-04-02 (×2): 500 mg via ORAL
  Filled 2022-04-01 (×2): qty 2

## 2022-04-01 MED ORDER — SODIUM CHLORIDE 0.45 % IV SOLN
INTRAVENOUS | Status: AC
  Filled 2022-04-01: qty 75

## 2022-04-01 MED ORDER — SENNOSIDES-DOCUSATE SODIUM 8.6-50 MG PO TABS
1.0000 | ORAL_TABLET | Freq: Every day | ORAL | Status: DC
  Administered 2022-04-01: 1 via ORAL
  Filled 2022-04-01: qty 1

## 2022-04-01 MED ORDER — METHYLPREDNISOLONE SODIUM SUCC 40 MG IJ SOLR
40.0000 mg | Freq: Every day | INTRAMUSCULAR | Status: DC
  Administered 2022-04-01: 40 mg via INTRAVENOUS
  Filled 2022-04-01 (×2): qty 1

## 2022-04-01 MED ORDER — AMLODIPINE BESYLATE 5 MG PO TABS
5.0000 mg | ORAL_TABLET | Freq: Every day | ORAL | Status: DC
  Administered 2022-04-01 – 2022-04-02 (×2): 5 mg via ORAL
  Filled 2022-04-01 (×2): qty 1

## 2022-04-01 MED ORDER — METRONIDAZOLE 500 MG PO TABS
500.0000 mg | ORAL_TABLET | Freq: Three times a day (TID) | ORAL | Status: DC
  Administered 2022-04-01 – 2022-04-02 (×3): 500 mg via ORAL
  Filled 2022-04-01 (×3): qty 1

## 2022-04-01 MED ORDER — METOPROLOL SUCCINATE ER 50 MG PO TB24
50.0000 mg | ORAL_TABLET | Freq: Every day | ORAL | Status: DC
  Administered 2022-04-01 – 2022-04-02 (×2): 50 mg via ORAL
  Filled 2022-04-01 (×2): qty 1

## 2022-04-01 NOTE — Progress Notes (Signed)
PROGRESS NOTE    Patient: Megan [name]                            PCP: Lemmie Evens, MD                    DOB: [birth year]            DOA: 03/30/2022 [id number]             DOS: 04/01/2022, 10:47 AM   LOS: 2 days   Date of Service: The patient was seen and examined on 04/01/2022  Subjective:   The patient was seen and examined this morning, stable no acute distress, off Levophed No issues overnight aside from generalized aches and pain including abdominal and foot pain    Brief Narrative:   Megan [name]  is a 71 y.o. 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  [facility]. -She presents to ED secondary to complaints of abdominal pain, she was seen by her PCP couple days ago, and started on some antibiotics for UTI, husband at bedside assist with the history, patient with very poor oral intake, fluid intake, and lysed weakness, fatigue, she does report abdominal pain, she cannot specify dysuria or polyuria, she denies fever, chills, does report nausea, denies any vomiting. -In emergency department her work-up was significant for low blood pressure, which responded initially to IV fluids, she was noted to be with a new AKI, creatinine of 3.69, from baseline of 1.28 in 2021, bladder scan showing post residual volume of 0, Foley catheter was inserted, she was hypotensive due to septic shock, work-up significant for acute diverticulitis, and cystitis on imaging, she had pyuria, she started on Rocephin and Flagyl, she did require Levophed for pressure support, as well she did receive IV Solu-Medrol given she is steroid dependent.        Assessment & Plan:     Principal Problem:   Sepsis ([place]) Active Problems:   GERD (gastroesophageal reflux disease)   H/O: CVA  (cerebrovascular accident)   Chronic pain   DM type 2 (diabetes mellitus, type 2) (HCC)   Benign essential HTN   Anxiety and depression   Atrial fibrillation, chronic (HCC)   Hypotension   AKI (acute kidney injury) ([place])    Septic shock/ Acute diverticulitis/ UTI with cystitis -Hemodynamically stable this morning, afebrile normotensive -off Levophed drip  -Septic shock present on admission, patient is hypotensive, poor response to fluid bolus despite 4 L, she started on Levophed for pressor support  -Continue with IV Rocephin and Flagyl  -Source of infection/septic shock plated to acute diverticulitis and UTI with cystitis  -Follow-up blood cultures >> no growth to date -Follow on urine cultures >> no growth to date  -Patient on chronic steroid, so we will start on IV Solu-Medrol, stress dose steroids  -Blood pressure rebounding, mildly hypertensive, anticipating reinitiation of BP meds    AKI  -Improving -Creatinine 3.6 so far appears to be oliguric,  -We will continue with Foley catheter inserted,  - Continue with IV fluids, avoid nephrotoxic medication, if no improvement will proceed with renal ultrasound  Lab Results  Component Value Date   CREATININE 2.06 (H) 04/01/2022   CREATININE 2.53 (H) 03/31/2022   CREATININE 3.69 (H) 03/30/2022  Atrial fibrillation  -Continue with apixaban  -He is currently in normal sinus rhythm, will hold metoprolol given hypotension, if needed will start on amiodarone drip   Diabetes mellitus -Holding oral hypoglycemic agents -Checking CBG QA CHS, with Isai coverage -A1c 6.8,    Hypothyroidism -Continue with Synthroid -TSH 1.90, free T41.06,   Chronic pain syndrome -As BP improves, as needed analgesics reinitiated   History of CVA in the past -Continue with Eliquis   Anxiety/depression  - She is on scheduled clonazepam, will resume at a lower dose to prevent withdrawals   Acute on chronic right foot pain  -X-ray  right foot 2 view was obtained: Diffuse osteoarthritis along with Unchanged chronic irregularity at the distal lateral aspect of the anterior process of the calcaneus related to the now subacute to chronic fracture better seen on prior 08/25/2021 CT. Continue PT, as needed analgesics  chronic steroid use -Does appear patient on chronic prednisone 5 mg oral daily due to osteoarthritis, given critical illness she will be started on IV steroids     ----------------------------------------------------------------------------------------------------------------------------------------------- Nutritional status:  [name] BMI is: Body mass index is 34.9 kg/m. I agree with the assessment and plan as outlined below: Nutrition Status:       -------------------------------------------------------------------------------------------------------------------------------------------- Cultures; Blood Cultures x 2 >> NGT Urine Culture  >>> NGT  -------------------------------------------------------------------------------------------------------------------------------------------  DVT prophylaxis:   apixaban (ELIQUIS) tablet 5 mg   Code Status:   Code Status: Full Code  Family Communication: Husband present at bedside, updated The above findings and plan of care has been discussed with patient (and family)  in detail,  they expressed understanding and agreement of above. -Advance care planning has been discussed.   Admission status:   Status is: Inpatient Remains inpatient appropriate because: Needing treatment for sepsis, septic shock, IV fluids, Levophed drip in ICU setting on IV antibiotics     Procedures:   No admission procedures for hospital encounter.   Antimicrobials:  Anti-infectives (From admission, onward)    Start     Dose/Rate Route Frequency Ordered Stop   03/31/22 1400  cefTRIAXone (ROCEPHIN) 2 g in sodium chloride 0.9 % 100 mL IVPB        2 g 200 mL/hr  over 30 Minutes Intravenous Every 24 hours 03/30/22 2241     03/31/22 0800  metroNIDAZOLE (FLAGYL) IVPB 500 mg        500 mg 100 mL/hr over 60 Minutes Intravenous Every 12 hours 03/30/22 2241     03/30/22 1700  metroNIDAZOLE (FLAGYL) IVPB 500 mg        500 mg 100 mL/hr over 60 Minutes Intravenous STAT 03/30/22 1656 03/30/22 1822   03/30/22 1415  cefTRIAXone (ROCEPHIN) 2 g in sodium chloride 0.9 % 100 mL IVPB        2 g 200 mL/hr over 30 Minutes Intravenous  Once 03/30/22 1413 03/30/22 1719        Medication:   amLODipine  5 mg Oral Daily   apixaban  5 mg Oral BID   Chlorhexidine Gluconate Cloth  6 each Topical Daily   insulin aspart  0-15 Units Subcutaneous TID WC   insulin aspart  0-5 Units Subcutaneous QHS   levothyroxine  25 mcg Oral Daily   methylPREDNISolone (SOLU-MEDROL) injection  40 mg Intravenous Daily   metoprolol succinate  50 mg Oral Daily   pantoprazole  40 mg Oral Daily   senna-docusate  1 tablet Oral QHS    acetaminophen **OR** acetaminophen, albuterol, clonazePAM, HYDROmorphone (DILAUDID) injection, ondansetron **OR**  ondansetron (ZOFRAN) IV, oxyCODONE   Objective:   Vitals:   04/01/22 0700 04/01/22 0800 04/01/22 0900 04/01/22 0922  BP: (!) 171/76 (!) 149/75 (!) 168/75 (!) 168/75  Pulse: (!) 103 79 69 81  Resp: 14 17 16    Temp:  98.2 F (36.8 C)    TempSrc: Oral     SpO2: 99% 100% 100%   Weight:      Height:        Intake/Output Summary (Last 24 hours) at 04/01/2022 1047 Last data filed at 04/01/2022 0800 Gross per 24 hour  Intake 1389.24 ml  Output 800 ml  Net 589.24 ml   Filed Weights   03/30/22 1350  Weight: 89.4 kg     Examination:     General:  AAO x 3,  cooperative, no distress;   HEENT:  Normocephalic, PERRL, otherwise with in Normal limits   Neuro:  CNII-XII intact. , normal motor and sensation, reflexes intact   Lungs:   Clear to auscultation BL, Respirations unlabored,  No wheezes / crackles  Cardio:    S1/S2, RRR, No murmure,  No Rubs or Gallops   Abdomen:  Soft, non-tender, bowel sounds active all four quadrants, no guarding or peritoneal signs.  Muscular  skeletal:  Limited exam -global generalized weaknesses - in bed, able to move all 4 extremities,   2+ pulses,  symmetric, No pitting edema  Skin:  Dry, warm to touch, negative for any Rashes,  Wounds: Please see nursing documentation         ------------------------------------------------------------------------------------------------------------------------------------------    LABs:     Latest Ref Rng & Units 04/01/2022    3:55 AM 03/31/2022    5:15 AM 03/30/2022    1:40 PM  CBC  WBC 4.0 - 10.5 K/uL 5.7  6.7  8.9   Hemoglobin 12.0 - 15.0 g/dL 9.7  10.4  10.6   Hematocrit 36.0 - 46.0 % 30.7  33.9  35.2   Platelets 150 - 400 K/uL 163  180  177       Latest Ref Rng & Units 04/01/2022    3:55 AM 03/31/2022    5:15 AM 03/30/2022    1:40 PM  CMP  Glucose 70 - 99 mg/dL 222  181  136   BUN 8 - 23 mg/dL 40  29  28   Creatinine 0.44 - 1.00 mg/dL 2.06  2.53  3.69   Sodium 135 - 145 mmol/L 130  134  134   Potassium 3.5 - 5.1 mmol/L 4.1  4.8  4.3   Chloride 98 - 111 mmol/L 102  108  102   CO2 22 - 32 mmol/L 20  16  22    Calcium 8.9 - 10.3 mg/dL 8.1  8.0  8.2   Total Protein 6.5 - 8.1 g/dL 6.6   7.1   Total Bilirubin 0.3 - 1.2 mg/dL 0.2   0.6   Alkaline Phos 38 - 126 U/L 117   121   AST 15 - 41 U/L 22   30   ALT 0 - 44 U/L 16   15        Micro Results Recent Results (from the past 240 hour(s))  Culture, blood (Routine x 2)     Status: None (Preliminary result)   Collection Time: 03/30/22  1:37 PM   Specimen: BLOOD  Result Value Ref Range Status   Specimen Description BLOOD BLOOD RIGHT ARM  Final   Special Requests   Final    BOTTLES DRAWN AEROBIC AND  ANAEROBIC Blood Culture results may not be optimal due to an inadequate volume of blood received in culture bottles   Culture   Final    NO GROWTH 2 DAYS Performed at [facility], [address], [place], [place] [address]    Report Status PENDING  Incomplete  Urine Culture     Status: None   Collection Time: 03/30/22  2:01 PM   Specimen: Urine, Catheterized  Result Value Ref Range Status   Specimen Description   Final    URINE, CATHETERIZED Performed at [facility], [address] [place], [place] [address]    Special Requests   Final    NONE Performed at [facility], [address], [place], [place] [address]    Culture   Final    NO GROWTH Performed at [facility], [place] [address] [place], [place] [address]    Report Status 03/31/2022 FINAL  Final  Culture, blood (Routine x 2)     Status: None (Preliminary result)   Collection Time: 03/30/22  2:59 PM   Specimen: BLOOD  Result Value Ref Range Status   Specimen Description BLOOD BLOOD LEFT HAND  Final   Special Requests   Final    BOTTLES DRAWN AEROBIC ONLY Blood Culture adequate volume   Culture   Final    NO GROWTH 2 DAYS Performed at [facility], [address], [place], [place] [address]    Report Status PENDING  Incomplete  MRSA Next Gen by PCR, Nasal     Status: None   Collection Time: 03/30/22  6:44 PM   Specimen: Nasal Mucosa; Nasal Swab  Result Value Ref Range Status   MRSA by PCR Next Gen NOT DETECTED NOT DETECTED Final    Comment: (NOTE) The GeneXpert MRSA Assay (FDA approved for NASAL specimens only), is one component of a comprehensive MRSA colonization surveillance program. It is not intended to diagnose MRSA infection nor to guide or monitor treatment for MRSA infections. Test performance is not FDA approved in patients less than 7 years old. Performed at [facility], [address], [place], [place] [address]     Radiology Reports DG Foot 2 Views Right  Result Date: 03/31/2022 CLINICAL DATA:  Lateral right foot pain.  No prior surgery. EXAM: RIGHT FOOT - 2 VIEW COMPARISON:  Right ankle radiographs 03/30/2022, right foot radiographs 10/19/2021; CT right foot 08/25/2021 FINDINGS:  There is diffuse decreased bone mineralization. Minimal hallux valgus with mild great toe metatarsophalangeal joint space narrowing. Mild joint space narrowing of the interphalangeal joints diffusely. Moderate tarsometatarsal joint space narrowing. Small plantar calcaneal heel spur. There is again mild irregularity and possible cortical step-off within the distal anterior aspect of the anterior process of the calcaneus on frontal view, not significantly changed from 10/19/2021 and related to the acute fracture seen on prior CT 08/25/2021. IMPRESSION: 1. No significant change compared to 11/09/2021. 2. Mild hallux valgus and mild great toe metatarsophalangeal joint osteoarthritis. 3. Moderate tarsometatarsal osteoarthritis. 4. Unchanged chronic irregularity at the distal lateral aspect of the anterior process of the calcaneus related to the now subacute to chronic fracture better seen on prior 08/25/2021 CT. Electronically Signed   By: Yvonne Kendall M.D.   On: 03/31/2022 16:13    SIGNED: Deatra James, MD, FHM. > 66 minutes of critical care time was spent seeing and examining patient, reviewing all medical records, reviewing all medication, drawn plan of care, discussion with patient and ICU staff regarding medical care and current pressors  [facility],  Pager (please use [email or web address] to page/text) Please use Epic Secure Chat for non-urgent communication (7AM-7PM)  If 7PM-7AM, please contact night-coverage [email or web address], 04/01/2022, 10:47 AM

## 2022-04-02 DIAGNOSIS — N179 Acute kidney failure, unspecified: Secondary | ICD-10-CM | POA: Diagnosis not present

## 2022-04-02 DIAGNOSIS — I482 Chronic atrial fibrillation, unspecified: Secondary | ICD-10-CM | POA: Diagnosis not present

## 2022-04-02 DIAGNOSIS — F419 Anxiety disorder, unspecified: Secondary | ICD-10-CM | POA: Diagnosis not present

## 2022-04-02 DIAGNOSIS — A419 Sepsis, unspecified organism: Secondary | ICD-10-CM | POA: Diagnosis not present

## 2022-04-02 LAB — COMPREHENSIVE METABOLIC PANEL
ALT: 13 U/L (ref 0–44)
AST: 16 U/L (ref 15–41)
Albumin: 2.8 g/dL — ABNORMAL LOW (ref 3.5–5.0)
Alkaline Phosphatase: 104 U/L (ref 38–126)
Anion gap: 10 (ref 5–15)
BUN: 38 mg/dL — ABNORMAL HIGH (ref 8–23)
CO2: 20 mmol/L — ABNORMAL LOW (ref 22–32)
Calcium: 8.4 mg/dL — ABNORMAL LOW (ref 8.9–10.3)
Chloride: 101 mmol/L (ref 98–111)
Creatinine, Ser: 1.62 mg/dL — ABNORMAL HIGH (ref 0.44–1.00)
GFR, Estimated: 34 mL/min — ABNORMAL LOW (ref >60)
Glucose, Bld: 180 mg/dL — ABNORMAL HIGH (ref 70–99)
Potassium: 4.1 mmol/L (ref 3.5–5.1)
Sodium: 131 mmol/L — ABNORMAL LOW (ref 135–145)
Total Bilirubin: 0.4 mg/dL (ref 0.3–1.2)
Total Protein: 6.4 g/dL — ABNORMAL LOW (ref 6.5–8.1)

## 2022-04-02 LAB — CBC
HCT: 30.9 % — ABNORMAL LOW (ref 36.0–46.0)
Hemoglobin: 10.1 g/dL — ABNORMAL LOW (ref 12.0–15.0)
MCH: 31.4 pg (ref 26.0–34.0)
MCHC: 32.7 g/dL (ref 30.0–36.0)
MCV: 96 fL (ref 80.0–100.0)
Platelets: 166 10*3/uL (ref 150–400)
RBC: 3.22 MIL/uL — ABNORMAL LOW (ref 3.87–5.11)
RDW: 14.1 % (ref 11.5–15.5)
WBC: 7.1 10*3/uL (ref 4.0–10.5)
nRBC: 0 % (ref 0.0–0.2)

## 2022-04-02 LAB — BRAIN NATRIURETIC PEPTIDE: B Natriuretic Peptide: 586 pg/mL — ABNORMAL HIGH (ref 0.0–100.0)

## 2022-04-02 LAB — GLUCOSE, CAPILLARY: Glucose-Capillary: 156 mg/dL — ABNORMAL HIGH (ref 70–99)

## 2022-04-02 MED ORDER — FLORANEX PO PACK
1.0000 g | PACK | Freq: Three times a day (TID) | ORAL | Status: DC
  Filled 2022-04-02 (×4): qty 1

## 2022-04-02 MED ORDER — METRONIDAZOLE 500 MG PO TABS: 500.0000 mg | ORAL_TABLET | Freq: Three times a day (TID) | ORAL | 0 refills | Status: AC

## 2022-04-02 MED ORDER — PREDNISONE 20 MG PO TABS: 40.0000 mg | ORAL_TABLET | Freq: Every day | ORAL | Status: DC

## 2022-04-02 MED ORDER — FLORANEX PO PACK: 1.0000 g | PACK | Freq: Three times a day (TID) | ORAL | 0 refills | Status: AC

## 2022-04-02 MED ORDER — PREDNISONE 20 MG PO TABS
20.0000 mg | ORAL_TABLET | Freq: Every day | ORAL | Status: DC
  Administered 2022-04-02: 20 mg via ORAL
  Filled 2022-04-02: qty 1

## 2022-04-02 MED ORDER — CIPROFLOXACIN HCL 250 MG PO TABS: 500.0000 mg | ORAL_TABLET | Freq: Two times a day (BID) | ORAL | Status: DC

## 2022-04-02 MED ORDER — METHYLPREDNISOLONE SODIUM SUCC 40 MG IJ SOLR: 20.0000 mg | Freq: Every day | INTRAMUSCULAR | Status: DC

## 2022-04-02 MED ORDER — CIPROFLOXACIN HCL 500 MG PO TABS: 500.0000 mg | ORAL_TABLET | Freq: Two times a day (BID) | ORAL | 0 refills | Status: AC

## 2022-04-02 NOTE — Discharge Summary (Signed)
Physician Discharge Summary   Patient: Megan [name] MRN: [id number] DOB: 09-Jan-1951  Admit date:     03/30/2022  Discharge date: 04/02/22  Discharge Physician: Deatra James   PCP: Lemmie Evens, MD   Recommendations at discharge:   Follow-up with a PCP within 1 week Continue current recommended antibiotics We will holding all medication that are nephrotoxic Kidney function needs to be monitored by PCP within the next 4-6 weeks  Discharge Diagnoses: Principal Problem:   Sepsis ([place]) Active Problems:   GERD (gastroesophageal reflux disease)   H/O: CVA (cerebrovascular accident)   Chronic pain   DM type 2 (diabetes mellitus, type 2) ([place])   Benign essential HTN   Anxiety and depression   Atrial fibrillation, chronic (HCC)   Hypotension   AKI (acute kidney injury) ([place])   Septic shock ([place])  Resolved Problems:   * No resolved hospital problems. *  Hospital Course: Megan [name]  is a 71 y.o. 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  [facility]. -She presents to ED secondary to complaints of abdominal pain, she was seen by her PCP couple days ago, and started on some antibiotics for UTI, husband at bedside assist with the history, patient with very poor oral intake, fluid intake, and lysed weakness, fatigue, she does report abdominal pain, she cannot specify dysuria or polyuria, she denies fever, chills, does report nausea, denies any vomiting. -In emergency department her work-up was significant for low blood pressure, which responded initially to IV fluids, she was noted to be with a new AKI, creatinine of 3.69, from baseline of 1.28 in 2021, bladder scan showing post residual volume of 0, Foley catheter was inserted, she was hypotensive due to septic shock,  work-up significant for acute diverticulitis, and cystitis on imaging, she had pyuria, she started on Rocephin and Flagyl, she did require Levophed for pressure support, as well she did receive IV Solu-Medrol given she is steroid dependent.     Principal Problem:   Sepsis ([place]) Active Problems:   GERD (gastroesophageal reflux disease)   H/O: CVA (cerebrovascular accident)   Chronic pain   DM type 2 (diabetes mellitus, type 2) (HCC)   Benign essential HTN   Anxiety and depression   Atrial fibrillation, chronic (HCC)   Hypotension   AKI (acute kidney injury) ([place])    Septic shock/ Acute diverticulitis/ UTI with cystitis -Hemodynamically stable this morning, afebrile normotensive -off Levophed drip  -Septic shock present on admission, patient is hypotensive, poor response to fluid bolus despite 4 L, she started on Levophed for pressor support  -Continue with IV Rocephin and Flagyl  -Source of infection/septic shock plated to acute diverticulitis and UTI with cystitis  -Follow-up blood cultures >> no growth to date -Follow on urine cultures >> no growth to date  -Patient on chronic steroid, was on IV Solu-Medrol, stress dose steroids..  Was tapered down on p.o. prednisone back to baseline  -Blood pressure rebounding, mildly hypertensive, anticipating reinitiation of BP meds (Home medication of Norvasc was restarted, discontinued ACE inhibitor due to AKI    AKI  -Improving -Creatinine 3.6 so far appears to be oliguric,  Discontinue Foley catheter - Continue with IV fluids, avoid nephrotoxic medication, if no improvement will proceed with renal ultrasound  Lab Results  Component Value Date   CREATININE 1.62 (H) 04/02/2022   CREATININE 2.06 (  H) 04/01/2022   CREATININE 2.53 (H) 03/31/2022   -Discontinued ACE inhibitor, lisinopril   Atrial fibrillation  -Continue with apixaban  -currently in normal sinus rhythm,  -Resuming home medication of metoprolol  Was holding  metoprolol due to hypotension,    Diabetes mellitus -Discontinued home medication of glipizide, Actos We will resume the rest of meds -Checking CBG QA CHS, with Isai coverage -A1c 6.8, -Follow-up with endocrinologist for close monitoring of blood sugars,    Hypothyroidism -Continue with Synthroid -TSH 1.90, free T41.06,   Chronic pain syndrome -As BP improves, as needed analgesics reinitiated   History of CVA in the past -Continue with Eliquis   Anxiety/depression  -Resuming home medications   Acute on chronic right foot pain  -X-ray right foot 2 view was obtained: Diffuse osteoarthritis along with Unchanged chronic irregularity at the distal lateral aspect of the anterior process of the calcaneus related to the now subacute to chronic fracture better seen on prior 08/25/2021 CT. Continue PT, as needed analgesics  chronic steroid use -Does appear patient on chronic prednisone 5 mg oral daily due to osteoarthritis, given critical illness she will be started on IV steroids>>> taper down back to 5 mg daily   Assessment and Plan: No notes have been filed under this hospital service. Service: Hospitalist     Consultants: None Procedures performed: None Disposition: Home health Diet recommendation:  Discharge Diet Orders (From admission, onward)     Start     Ordered   04/02/22 0000  Diet - low sodium heart healthy        04/02/22 1032           Carb modified diet DISCHARGE MEDICATION: Allergies as of 04/02/2022       Reactions   Naproxen Other (See Comments)   Causes stomach burning        Medication List     STOP taking these medications    amoxicillin-clavulanate 500-125 MG tablet Commonly known as: AUGMENTIN   enalapril 10 MG tablet Commonly known as: VASOTEC   glipiZIDE 5 MG tablet Commonly known as: GLUCOTROL   hydrochlorothiazide 25 MG tablet Commonly known as: HYDRODIURIL   HYDROcodone-acetaminophen 5-325 MG tablet Commonly known  as: NORCO/VICODIN   pantoprazole 40 MG tablet Commonly known as: PROTONIX   pioglitazone 15 MG tablet Commonly known as: ACTOS       TAKE these medications    acetaminophen 325 MG tablet Commonly known as: Tylenol Take 2 tablets (650 mg total) by mouth every 6 (six) hours as needed for mild pain, moderate pain or headache.   apixaban 5 MG Tabs tablet Commonly known as: Eliquis Take 1 tablet (5 mg total) by mouth 2 (two) times daily. Restart on 7/14 What changed:  when to take this additional instructions   B-12 PO Take by mouth daily.   ciprofloxacin 500 MG tablet Commonly known as: CIPRO Take 1 tablet (500 mg total) by mouth 2 (two) times daily for 7 days.   clonazePAM 1 MG tablet Commonly known as: KLONOPIN Take 1 mg by mouth 2 (two) times daily as needed for anxiety.   escitalopram 10 MG tablet Commonly known as: LEXAPRO Take 1 tablet (10 mg total) by mouth daily. What changed: how much to take   fluticasone 50 MCG/ACT nasal spray Commonly known as: FLONASE Place 2 sprays into both nostrils daily.   furosemide 40 MG tablet Commonly known as: LASIX Take 40-80 mg by mouth daily as needed.   Jardiance 10 MG Tabs tablet  Generic drug: empagliflozin Take 10 mg by mouth every morning.   lactobacillus Pack Take 1 packet (1 g total) by mouth 3 (three) times daily with meals for 10 days.   levothyroxine 25 MCG tablet Commonly known as: SYNTHROID Take 25 mcg by mouth daily.   metFORMIN 500 MG tablet Commonly known as: GLUCOPHAGE Take 1,000 mg by mouth 2 (two) times daily.   metoprolol succinate 50 MG 24 hr tablet Commonly known as: TOPROL-XL Take 1 tablet (50 mg total) by mouth daily with lunch.   metroNIDAZOLE 500 MG tablet Commonly known as: FLAGYL Take 1 tablet (500 mg total) by mouth every 8 (eight) hours for 7 days.   ondansetron 4 MG tablet Commonly known as: ZOFRAN 4 mg every 8 (eight) hours as needed for vomiting or nausea.    oxyCODONE-acetaminophen 5-325 MG tablet Commonly known as: PERCOCET/ROXICET Take 1 tablet by mouth 3 (three) times daily.   predniSONE 5 MG tablet Commonly known as: DELTASONE Take 5 mg by mouth daily.   VITAMIN D-3 PO Take 2,000 Units by mouth daily.        Discharge Exam: Filed Weights   03/30/22 1350  Weight: 89.4 kg      Physical Exam:   General:  AAO x 3,  cooperative, no distress;   HEENT:  Normocephalic, PERRL, otherwise with in Normal limits   Neuro:  CNII-XII intact. , normal motor and sensation, reflexes intact   Lungs:   Clear to auscultation BL, Respirations unlabored,  No wheezes / crackles  Cardio:    S1/S2, RRR, No murmure, No Rubs or Gallops   Abdomen:  Soft, non-tender, bowel sounds active all four quadrants, no guarding or peritoneal signs.  Muscular  skeletal:  Limited exam -global generalized weaknesses - in bed, able to move all 4 extremities,   2+ pulses,  symmetric, No pitting edema  Skin:  Dry, warm to touch, negative for any Rashes,  Wounds: Please see nursing documentation          Condition at discharge: good  The results of significant diagnostics from this hospitalization (including imaging, microbiology, ancillary and laboratory) are listed below for reference.   Imaging Studies: DG Foot 2 Views Right  Result Date: 03/31/2022 CLINICAL DATA:  Lateral right foot pain.  No prior surgery. EXAM: RIGHT FOOT - 2 VIEW COMPARISON:  Right ankle radiographs 03/30/2022, right foot radiographs 10/19/2021; CT right foot 08/25/2021 FINDINGS: There is diffuse decreased bone mineralization. Minimal hallux valgus with mild great toe metatarsophalangeal joint space narrowing. Mild joint space narrowing of the interphalangeal joints diffusely. Moderate tarsometatarsal joint space narrowing. Small plantar calcaneal heel spur. There is again mild irregularity and possible cortical step-off within the distal anterior aspect of the anterior process of the  calcaneus on frontal view, not significantly changed from 10/19/2021 and related to the acute fracture seen on prior CT 08/25/2021. IMPRESSION: 1. No significant change compared to 11/09/2021. 2. Mild hallux valgus and mild great toe metatarsophalangeal joint osteoarthritis. 3. Moderate tarsometatarsal osteoarthritis. 4. Unchanged chronic irregularity at the distal lateral aspect of the anterior process of the calcaneus related to the now subacute to chronic fracture better seen on prior 08/25/2021 CT. Electronically Signed   By: Yvonne Kendall M.D.   On: 03/31/2022 16:13   CT ABDOMEN PELVIS WO CONTRAST  Result Date: 03/30/2022 CLINICAL DATA:  Hypotension with abdomen pain EXAM: CT ABDOMEN AND PELVIS WITHOUT CONTRAST TECHNIQUE: Multidetector CT imaging of the abdomen and pelvis was performed following the standard protocol without IV  contrast. RADIATION DOSE REDUCTION: This exam was performed according to the departmental dose-optimization program which includes automated exposure control, adjustment of the mA and/or kV according to patient size and/or use of iterative reconstruction technique. COMPARISON:  CT 03/08/2015, lumbar radiograph 04/25/2017, 05/04/2020, CT 05/04/2020 FINDINGS: Lower chest: Lung bases demonstrate linear scarring or atelectasis at the bases. No acute airspace disease. Cardiomegaly. Hepatobiliary: Status post cholecystectomy. No focal hepatic abnormality. Common bile duct measures up to 10 mm. Hazy edematous appearance at the porta hepatis with possible periportal hypodensity though limited without contrast. Pancreas: Atrophic.  No inflammation Spleen: Normal in size without focal abnormality. Adrenals/Urinary Tract: Stable bilateral adrenal gland nodules measuring 14 mm on the left and 14 mm on the right, consistent with adenoma, no specific imaging follow-up is recommended. Nonspecific perinephric fat stranding. Probable cortical atrophy of kidneys. No hydronephrosis. Redundant catheter  tubing in the bladder. The bladder is thick walled with stranding. Stomach/Bowel: The stomach is nonenlarged. Small duodenal diverticulum. There is no dilated small bowel. Negative appendix. Diverticular disease of the left colon. Mild fat stranding at the sigmoid colon, series 2, image 66 without substantial wall thickening. Vascular/Lymphatic: Moderate severe aortic atherosclerosis. No aneurysm. No suspicious lymph nodes Reproductive: Status post hysterectomy. No adnexal masses. Other: Negative for pelvic effusion or free air. Small fat containing periumbilical hernia. Musculoskeletal: Moderate compression fractures T10 and T12, probably chronic. IMPRESSION: 1. No hydronephrosis. Decompressed urinary bladder appears thick walled with perivesical stranding, suspicious for cystitis. Nonspecific moderate perinephric fat stranding 2. Left colon diverticular disease with mild fat stranding adjacent to a few sigmoid colon diverticula suggestive of mild diverticulitis. No significant wall thickening. 3. Status post cholecystectomy with mildly prominent common bile duct likely due to surgical change but correlate with appropriate laboratory values. Electronically Signed   By: Donavan Foil M.D.   On: 03/30/2022 16:47   DG Ankle Complete Right  Result Date: 03/30/2022 CLINICAL DATA:  Right lateral ankle swelling. EXAM: RIGHT ANKLE - COMPLETE 3+ VIEW COMPARISON:  None Available. FINDINGS: There is no evidence of fracture, dislocation, or joint effusion. There is no evidence of arthropathy or other focal bone abnormality. Soft tissues swelling about the right ankle. IMPRESSION: 1. No acute fracture or dislocation identified about the right ankle. 2. Soft tissue swelling about the right ankle. Electronically Signed   By: Fidela Salisbury M.D.   On: 03/30/2022 14:25   DG Chest Port 1 View  Result Date: 03/30/2022 CLINICAL DATA:  Possible sepsis EXAM: PORTABLE CHEST 1 VIEW COMPARISON:  06/01/2021 FINDINGS: Mild  bilateral interstitial thickening. No focal consolidation. No pleural effusion or pneumothorax. Stable cardiomegaly. No acute osseous abnormality. IMPRESSION: 1. Cardiomegaly with mild pulmonary vascular congestion. Electronically Signed   By: Kathreen Devoid M.D.   On: 03/30/2022 14:24    Microbiology: Results for orders placed or performed during the hospital encounter of 03/30/22  Culture, blood (Routine x 2)     Status: None (Preliminary result)   Collection Time: 03/30/22  1:37 PM   Specimen: BLOOD  Result Value Ref Range Status   Specimen Description BLOOD BLOOD RIGHT ARM  Final   Special Requests   Final    BOTTLES DRAWN AEROBIC AND ANAEROBIC Blood Culture results may not be optimal due to an inadequate volume of blood received in culture bottles   Culture   Final    NO GROWTH 3 DAYS Performed at [facility], [address] [place], [place] [address]    Report Status PENDING  Incomplete  Urine Culture  Status: None   Collection Time: 03/30/22  2:01 PM   Specimen: Urine, Catheterized  Result Value Ref Range Status   Specimen Description   Final    URINE, CATHETERIZED Performed at [facility], [address], [place], [place] [address]    Special Requests   Final    NONE Performed at [facility], [address] [place], [place] [address]    Culture   Final    NO GROWTH Performed at [facility], [place] [address] [place], [place] [address]    Report Status 03/31/2022 FINAL  Final  Culture, blood (Routine x 2)     Status: None (Preliminary result)   Collection Time: 03/30/22  2:59 PM   Specimen: BLOOD  Result Value Ref Range Status   Specimen Description BLOOD BLOOD LEFT HAND  Final   Special Requests   Final    BOTTLES DRAWN AEROBIC ONLY Blood Culture adequate volume   Culture   Final    NO GROWTH 3 DAYS Performed at [facility], [address] [place], [place] [address]    Report Status PENDING  Incomplete  MRSA Next Gen by PCR, Nasal     Status: None    Collection Time: 03/30/22  6:44 PM   Specimen: Nasal Mucosa; Nasal Swab  Result Value Ref Range Status   MRSA by PCR Next Gen NOT DETECTED NOT DETECTED Final    Comment: (NOTE) The GeneXpert MRSA Assay (FDA approved for NASAL specimens only), is one component of a comprehensive MRSA colonization surveillance program. It is not intended to diagnose MRSA infection nor to guide or monitor treatment for MRSA infections. Test performance is not FDA approved in patients less than 17 years old. Performed at [facility], [address], [place], [place] [address]     Labs: CBC: Recent Labs  Lab 03/30/22 1340 03/31/22 0515 04/01/22 0355 04/02/22 0459  WBC 8.9 6.7 5.7 7.1  NEUTROABS 6.1  --   --   --   HGB 10.6* 10.4* 9.7* 10.1*  HCT 35.2* 33.9* 30.7* 30.9*  MCV 102.6* 101.5* 97.2 96.0  PLT 177 180 163 425   Basic Metabolic Panel: Recent Labs  Lab 03/30/22 1340 03/31/22 0515 04/01/22 0355 04/02/22 0459  NA 134* 134* 130* 131*  K 4.3 4.8 4.1 4.1  CL 102 108 102 101  CO2 22 16* 20* 20*  GLUCOSE 136* 181* 222* 180*  BUN 28* 29* 40* 38*  CREATININE 3.69* 2.53* 2.06* 1.62*  CALCIUM 8.2* 8.0* 8.1* 8.4*   Liver Function Tests: Recent Labs  Lab 03/30/22 1340 04/01/22 0355 04/02/22 0459  AST 30 22 16   ALT 15 16 13   ALKPHOS 121 117 104  BILITOT 0.6 0.2* 0.4  PROT 7.1 6.6 6.4*  ALBUMIN 3.1* 2.8* 2.8*   CBG: Recent Labs  Lab 04/01/22 0736 04/01/22 1114 04/01/22 1645 04/01/22 2128 04/02/22 0741  GLUCAP 203* 176* 175* 214* 156*    Discharge time spent: greater than 30 minutes.  Signed: Deatra James, MD [facility] 04/02/2022

## 2022-04-02 NOTE — Plan of Care (Signed)

## 2022-04-04 LAB — CULTURE, BLOOD (ROUTINE X 2)
Culture: NO GROWTH
Culture: NO GROWTH
Special Requests: ADEQUATE

## 2022-04-14 ENCOUNTER — Ambulatory Visit: Admission: EM | Admit: 2022-04-14 | Discharge: 2022-04-14 | Payer: Medicare Other

## 2022-04-14 ENCOUNTER — Inpatient Hospital Stay (HOSPITAL_COMMUNITY)
Admission: EM | Admit: 2022-04-14 | Discharge: 2022-04-16 | DRG: 917 | Disposition: A | Discharge status: Home Health | Payer: Medicare Other | Attending: Family Medicine | Admitting: Family Medicine

## 2022-04-14 ENCOUNTER — Emergency Department (HOSPITAL_COMMUNITY): Payer: Medicare Other

## 2022-04-14 ENCOUNTER — Encounter: Payer: Self-pay | Admitting: Emergency Medicine

## 2022-04-14 ENCOUNTER — Encounter (HOSPITAL_COMMUNITY): Payer: Self-pay

## 2022-04-14 ENCOUNTER — Other Ambulatory Visit: Payer: Self-pay

## 2022-04-14 DIAGNOSIS — E669 Obesity, unspecified: Secondary | ICD-10-CM | POA: Diagnosis present

## 2022-04-14 DIAGNOSIS — F32A Depression, unspecified: Secondary | ICD-10-CM | POA: Diagnosis present

## 2022-04-14 DIAGNOSIS — I10 Essential (primary) hypertension: Secondary | ICD-10-CM | POA: Diagnosis present

## 2022-04-14 DIAGNOSIS — I480 Paroxysmal atrial fibrillation: Secondary | ICD-10-CM | POA: Diagnosis present

## 2022-04-14 DIAGNOSIS — T402X1A Poisoning by other opioids, accidental (unintentional), initial encounter: Secondary | ICD-10-CM | POA: Diagnosis present

## 2022-04-14 DIAGNOSIS — Y92009 Unspecified place in unspecified non-institutional (private) residence as the place of occurrence of the external cause: Secondary | ICD-10-CM | POA: Diagnosis not present

## 2022-04-14 DIAGNOSIS — I482 Chronic atrial fibrillation, unspecified: Secondary | ICD-10-CM | POA: Diagnosis present

## 2022-04-14 DIAGNOSIS — F13129 Sedative, hypnotic or anxiolytic abuse with intoxication, unspecified: Principal | ICD-10-CM

## 2022-04-14 DIAGNOSIS — Z6834 Body mass index (BMI) 34.0-34.9, adult: Secondary | ICD-10-CM

## 2022-04-14 DIAGNOSIS — Z7952 Long term (current) use of systemic steroids: Secondary | ICD-10-CM

## 2022-04-14 DIAGNOSIS — K5909 Other constipation: Secondary | ICD-10-CM | POA: Diagnosis present

## 2022-04-14 DIAGNOSIS — F29 Unspecified psychosis not due to a substance or known physiological condition: Secondary | ICD-10-CM | POA: Diagnosis present

## 2022-04-14 DIAGNOSIS — R601 Generalized edema: Secondary | ICD-10-CM | POA: Diagnosis not present

## 2022-04-14 DIAGNOSIS — G8929 Other chronic pain: Secondary | ICD-10-CM | POA: Diagnosis present

## 2022-04-14 DIAGNOSIS — K219 Gastro-esophageal reflux disease without esophagitis: Secondary | ICD-10-CM | POA: Diagnosis present

## 2022-04-14 DIAGNOSIS — F4001 Agoraphobia with panic disorder: Secondary | ICD-10-CM | POA: Diagnosis present

## 2022-04-14 DIAGNOSIS — E1169 Type 2 diabetes mellitus with other specified complication: Secondary | ICD-10-CM | POA: Diagnosis not present

## 2022-04-14 DIAGNOSIS — Z8711 Personal history of peptic ulcer disease: Secondary | ICD-10-CM | POA: Diagnosis not present

## 2022-04-14 DIAGNOSIS — W01198A Fall on same level from slipping, tripping and stumbling with subsequent striking against other object, initial encounter: Secondary | ICD-10-CM | POA: Diagnosis present

## 2022-04-14 DIAGNOSIS — Z90710 Acquired absence of both cervix and uterus: Secondary | ICD-10-CM | POA: Diagnosis not present

## 2022-04-14 DIAGNOSIS — Z8673 Personal history of transient ischemic attack (TIA), and cerebral infarction without residual deficits: Secondary | ICD-10-CM

## 2022-04-14 DIAGNOSIS — E119 Type 2 diabetes mellitus without complications: Secondary | ICD-10-CM | POA: Diagnosis present

## 2022-04-14 DIAGNOSIS — Z87891 Personal history of nicotine dependence: Secondary | ICD-10-CM

## 2022-04-14 DIAGNOSIS — R531 Weakness: Secondary | ICD-10-CM | POA: Diagnosis present

## 2022-04-14 DIAGNOSIS — G9341 Metabolic encephalopathy: Secondary | ICD-10-CM | POA: Diagnosis present

## 2022-04-14 DIAGNOSIS — T424X1A Poisoning by benzodiazepines, accidental (unintentional), initial encounter: Principal | ICD-10-CM | POA: Diagnosis present

## 2022-04-14 DIAGNOSIS — Z9049 Acquired absence of other specified parts of digestive tract: Secondary | ICD-10-CM

## 2022-04-14 DIAGNOSIS — F419 Anxiety disorder, unspecified: Secondary | ICD-10-CM | POA: Diagnosis present

## 2022-04-14 DIAGNOSIS — J9601 Acute respiratory failure with hypoxia: Secondary | ICD-10-CM | POA: Diagnosis present

## 2022-04-14 DIAGNOSIS — R600 Localized edema: Secondary | ICD-10-CM | POA: Diagnosis present

## 2022-04-14 DIAGNOSIS — Z9181 History of falling: Secondary | ICD-10-CM | POA: Diagnosis not present

## 2022-04-14 DIAGNOSIS — Z9842 Cataract extraction status, left eye: Secondary | ICD-10-CM

## 2022-04-14 DIAGNOSIS — J189 Pneumonia, unspecified organism: Secondary | ICD-10-CM | POA: Diagnosis present

## 2022-04-14 DIAGNOSIS — Z886 Allergy status to analgesic agent status: Secondary | ICD-10-CM

## 2022-04-14 DIAGNOSIS — R519 Headache, unspecified: Secondary | ICD-10-CM | POA: Diagnosis present

## 2022-04-14 DIAGNOSIS — W19XXXA Unspecified fall, initial encounter: Secondary | ICD-10-CM

## 2022-04-14 DIAGNOSIS — Z79890 Hormone replacement therapy (postmenopausal): Secondary | ICD-10-CM

## 2022-04-14 DIAGNOSIS — Z20822 Contact with and (suspected) exposure to covid-19: Secondary | ICD-10-CM | POA: Diagnosis present

## 2022-04-14 DIAGNOSIS — K5900 Constipation, unspecified: Secondary | ICD-10-CM | POA: Diagnosis present

## 2022-04-14 DIAGNOSIS — G928 Other toxic encephalopathy: Secondary | ICD-10-CM | POA: Diagnosis present

## 2022-04-14 DIAGNOSIS — Z961 Presence of intraocular lens: Secondary | ICD-10-CM | POA: Diagnosis present

## 2022-04-14 DIAGNOSIS — E039 Hypothyroidism, unspecified: Secondary | ICD-10-CM | POA: Diagnosis present

## 2022-04-14 DIAGNOSIS — R609 Edema, unspecified: Secondary | ICD-10-CM

## 2022-04-14 DIAGNOSIS — Z7901 Long term (current) use of anticoagulants: Secondary | ICD-10-CM

## 2022-04-14 DIAGNOSIS — M1990 Unspecified osteoarthritis, unspecified site: Secondary | ICD-10-CM | POA: Diagnosis present

## 2022-04-14 DIAGNOSIS — R0902 Hypoxemia: Secondary | ICD-10-CM

## 2022-04-14 DIAGNOSIS — Z86010 Personal history of colonic polyps: Secondary | ICD-10-CM

## 2022-04-14 DIAGNOSIS — R5383 Other fatigue: Secondary | ICD-10-CM

## 2022-04-14 DIAGNOSIS — I959 Hypotension, unspecified: Secondary | ICD-10-CM

## 2022-04-14 DIAGNOSIS — Z79899 Other long term (current) drug therapy: Secondary | ICD-10-CM

## 2022-04-14 DIAGNOSIS — Z7984 Long term (current) use of oral hypoglycemic drugs: Secondary | ICD-10-CM

## 2022-04-14 DIAGNOSIS — Z9841 Cataract extraction status, right eye: Secondary | ICD-10-CM

## 2022-04-14 LAB — URINALYSIS, ROUTINE W REFLEX MICROSCOPIC
Bilirubin Urine: NEGATIVE
Glucose, UA: NEGATIVE mg/dL
Ketones, ur: NEGATIVE mg/dL
Nitrite: NEGATIVE
Protein, ur: NEGATIVE mg/dL
Specific Gravity, Urine: 1.021 (ref 1.005–1.030)
pH: 6 (ref 5.0–8.0)

## 2022-04-14 LAB — RESP PANEL BY RT-PCR (FLU A&B, COVID) ARPGX2
Influenza A by PCR: NEGATIVE
Influenza B by PCR: NEGATIVE
SARS Coronavirus 2 by RT PCR: NEGATIVE

## 2022-04-14 LAB — RAPID URINE DRUG SCREEN, HOSP PERFORMED
Amphetamines: NOT DETECTED
Barbiturates: NOT DETECTED
Benzodiazepines: POSITIVE — AB
Cocaine: NOT DETECTED
Opiates: POSITIVE — AB
Tetrahydrocannabinol: NOT DETECTED

## 2022-04-14 LAB — COMPREHENSIVE METABOLIC PANEL
ALT: 10 U/L (ref 0–44)
AST: 16 U/L (ref 15–41)
Albumin: 2.9 g/dL — ABNORMAL LOW (ref 3.5–5.0)
Alkaline Phosphatase: 117 U/L (ref 38–126)
Anion gap: 10 (ref 5–15)
BUN: 6 mg/dL — ABNORMAL LOW (ref 8–23)
CO2: 26 mmol/L (ref 22–32)
Calcium: 7.7 mg/dL — ABNORMAL LOW (ref 8.9–10.3)
Chloride: 101 mmol/L (ref 98–111)
Creatinine, Ser: 1.48 mg/dL — ABNORMAL HIGH (ref 0.44–1.00)
GFR, Estimated: 38 mL/min — ABNORMAL LOW (ref >60)
Glucose, Bld: 120 mg/dL — ABNORMAL HIGH (ref 70–99)
Potassium: 3.5 mmol/L (ref 3.5–5.1)
Sodium: 137 mmol/L (ref 135–145)
Total Bilirubin: 1 mg/dL (ref 0.3–1.2)
Total Protein: 6.2 g/dL — ABNORMAL LOW (ref 6.5–8.1)

## 2022-04-14 LAB — CBC WITH DIFFERENTIAL/PLATELET
Abs Immature Granulocytes: 0.02 10*3/uL (ref 0.00–0.07)
Basophils Absolute: 0 10*3/uL (ref 0.0–0.1)
Basophils Relative: 0 %
Eosinophils Absolute: 0 10*3/uL (ref 0.0–0.5)
Eosinophils Relative: 1 %
HCT: 32.2 % — ABNORMAL LOW (ref 36.0–46.0)
Hemoglobin: 10.2 g/dL — ABNORMAL LOW (ref 12.0–15.0)
Immature Granulocytes: 0 %
Lymphocytes Relative: 12 %
Lymphs Abs: 1 10*3/uL (ref 0.7–4.0)
MCH: 31.2 pg (ref 26.0–34.0)
MCHC: 31.7 g/dL (ref 30.0–36.0)
MCV: 98.5 fL (ref 80.0–100.0)
Monocytes Absolute: 0.4 10*3/uL (ref 0.1–1.0)
Monocytes Relative: 5 %
Neutro Abs: 6.7 10*3/uL (ref 1.7–7.7)
Neutrophils Relative %: 82 %
Platelets: 161 10*3/uL (ref 150–400)
RBC: 3.27 MIL/uL — ABNORMAL LOW (ref 3.87–5.11)
RDW: 14.9 % (ref 11.5–15.5)
WBC: 8.2 10*3/uL (ref 4.0–10.5)
nRBC: 0 % (ref 0.0–0.2)

## 2022-04-14 LAB — LACTIC ACID, PLASMA
Lactic Acid, Venous: 1.2 mmol/L (ref 0.5–1.9)
Lactic Acid, Venous: 0.8 mmol/L (ref 0.5–1.9)

## 2022-04-14 LAB — GLUCOSE, CAPILLARY: Glucose-Capillary: 87 mg/dL (ref 70–99)

## 2022-04-14 LAB — BRAIN NATRIURETIC PEPTIDE: B Natriuretic Peptide: 539 pg/mL — ABNORMAL HIGH (ref 0.0–100.0)

## 2022-04-14 LAB — PROCALCITONIN: Procalcitonin: <0.1 ng/mL

## 2022-04-14 MED ORDER — IOHEXOL 350 MG/ML SOLN
75.0000 mL | Freq: Once | INTRAVENOUS | Status: AC | PRN
  Administered 2022-04-14: 75 mL via INTRAVENOUS

## 2022-04-14 MED ORDER — SODIUM CHLORIDE 0.9 % IV SOLN
1.0000 g | INTRAVENOUS | Status: DC
  Administered 2022-04-14 – 2022-04-15 (×2): 1 g via INTRAVENOUS
  Filled 2022-04-14 (×2): qty 10

## 2022-04-14 MED ORDER — FUROSEMIDE 10 MG/ML IJ SOLN
20.0000 mg | Freq: Once | INTRAMUSCULAR | Status: AC
  Administered 2022-04-14: 20 mg via INTRAVENOUS
  Filled 2022-04-14: qty 2

## 2022-04-14 MED ORDER — OXYCODONE HCL 5 MG PO TABS
5.0000 mg | ORAL_TABLET | ORAL | Status: DC | PRN
  Administered 2022-04-15 – 2022-04-16 (×4): 5 mg via ORAL
  Filled 2022-04-14 (×5): qty 1

## 2022-04-14 MED ORDER — ONDANSETRON HCL 4 MG/2ML IJ SOLN: 4.0000 mg | Freq: Four times a day (QID) | INTRAMUSCULAR | Status: DC | PRN

## 2022-04-14 MED ORDER — METOPROLOL SUCCINATE ER 50 MG PO TB24
50.0000 mg | ORAL_TABLET | Freq: Every day | ORAL | Status: DC
  Administered 2022-04-15 – 2022-04-16 (×2): 50 mg via ORAL
  Filled 2022-04-14 (×2): qty 1

## 2022-04-14 MED ORDER — SENNOSIDES-DOCUSATE SODIUM 8.6-50 MG PO TABS
1.0000 | ORAL_TABLET | Freq: Every day | ORAL | Status: DC
  Administered 2022-04-14 – 2022-04-15 (×2): 1 via ORAL
  Filled 2022-04-14 (×2): qty 1

## 2022-04-14 MED ORDER — PHENOL 1.4 % MT LIQD: 1.0000 | OROMUCOSAL | Status: DC | PRN

## 2022-04-14 MED ORDER — PNEUMOCOCCAL 20-VAL CONJ VACC 0.5 ML IM SUSY
0.5000 mL | PREFILLED_SYRINGE | INTRAMUSCULAR | Status: DC
  Filled 2022-04-14: qty 0.5

## 2022-04-14 MED ORDER — SODIUM CHLORIDE 0.9 % IV BOLUS
1000.0000 mL | Freq: Once | INTRAVENOUS | Status: AC
  Administered 2022-04-14: 1000 mL via INTRAVENOUS

## 2022-04-14 MED ORDER — ESCITALOPRAM OXALATE 10 MG PO TABS
10.0000 mg | ORAL_TABLET | Freq: Every day | ORAL | Status: DC
  Administered 2022-04-14 – 2022-04-16 (×3): 10 mg via ORAL
  Filled 2022-04-14 (×3): qty 1

## 2022-04-14 MED ORDER — SODIUM CHLORIDE 0.9 % IV SOLN
500.0000 mg | INTRAVENOUS | Status: DC
  Administered 2022-04-14 – 2022-04-15 (×2): 500 mg via INTRAVENOUS
  Filled 2022-04-14 (×2): qty 5

## 2022-04-14 MED ORDER — APIXABAN 5 MG PO TABS
5.0000 mg | ORAL_TABLET | Freq: Every day | ORAL | Status: DC
  Administered 2022-04-15 – 2022-04-16 (×2): 5 mg via ORAL
  Filled 2022-04-14 (×2): qty 1

## 2022-04-14 MED ORDER — INSULIN ASPART 100 UNIT/ML IJ SOLN: 0.0000 [IU] | Freq: Every day | INTRAMUSCULAR | Status: DC

## 2022-04-14 MED ORDER — LEVOTHYROXINE SODIUM 25 MCG PO TABS
25.0000 ug | ORAL_TABLET | Freq: Every day | ORAL | Status: DC
  Administered 2022-04-15 – 2022-04-16 (×2): 25 ug via ORAL
  Filled 2022-04-14 (×2): qty 1

## 2022-04-14 MED ORDER — INSULIN ASPART 100 UNIT/ML IJ SOLN
0.0000 [IU] | Freq: Three times a day (TID) | INTRAMUSCULAR | Status: DC
  Administered 2022-04-15: 2 [IU] via SUBCUTANEOUS

## 2022-04-14 MED ORDER — ONDANSETRON HCL 4 MG PO TABS: 4.0000 mg | ORAL_TABLET | Freq: Four times a day (QID) | ORAL | Status: DC | PRN

## 2022-04-14 MED ORDER — INFLUENZA VAC A&B SA ADJ QUAD 0.5 ML IM PRSY
0.5000 mL | PREFILLED_SYRINGE | INTRAMUSCULAR | Status: DC
  Filled 2022-04-14: qty 0.5

## 2022-04-14 MED ORDER — ACETAMINOPHEN 325 MG PO TABS: 650.0000 mg | ORAL_TABLET | Freq: Four times a day (QID) | ORAL | Status: DC | PRN

## 2022-04-14 MED ORDER — ACETAMINOPHEN 650 MG RE SUPP: 650.0000 mg | Freq: Four times a day (QID) | RECTAL | Status: DC | PRN

## 2022-04-14 MED ORDER — ALBUTEROL SULFATE (2.5 MG/3ML) 0.083% IN NEBU: 2.5000 mg | INHALATION_SOLUTION | RESPIRATORY_TRACT | Status: DC | PRN

## 2022-04-14 NOTE — Assessment & Plan Note (Signed)
-   CTA was done that did not show PE but did show a left lower lobe infiltrate -Rocephin and Zithromax started -Procalcitonin per lower respiratory tract algorithm has been ordered -Sputum culture ordered - Blood culture ordered - Urine strep and Legionella antigens ordered -Continue to monitor

## 2022-04-14 NOTE — ED Notes (Signed)
Husband states patient took 3 Klonopin last night to help her sleep.  States patient sleep all night and most of the day today.  Patient falls asleep in wheelchair frequently, but easy to arouse.

## 2022-04-14 NOTE — Assessment & Plan Note (Signed)
-   Holding Klonopin due to overdose - Continue Lexapro - Continue to monitor

## 2022-04-14 NOTE — Assessment & Plan Note (Addendum)
-   Currently maintaining oxygen sats on room air - Patient was requiring oxygen during part of her ER stay-2 L nasal cannula to maintain mid 90s O2 sats - No oxygen at home - Secondary to benzo overdose - CTA was done and also showed left lower lobe infiltrate which is also contributing -Weaned off of O2 at this point - Continue to monitor

## 2022-04-14 NOTE — Assessment & Plan Note (Signed)
-   Home oral hypoglycemics - Sliding scale coverage - Modified diet - Last hemoglobin A1c 6.8 - Continue to monitor

## 2022-04-14 NOTE — ED Notes (Signed)
EMS here to transport 

## 2022-04-14 NOTE — Assessment & Plan Note (Signed)
-   2+ pitting edema - Patient has a prescription for 40-80 mg of Lasix at home - Patient reports that she was told only to take 20 mg of Lasix, so that is what she is been taking - BNP is also elevated at 539 - Last echo was in 2019 and showed normal systolic function was not able to evaluate diastolic dysfunction - Update echo - 20 mg IV Lasix given in the ED - Monitor ins and outs - Continue to monitor

## 2022-04-14 NOTE — ED Notes (Signed)
[facility] called for transport to ED.

## 2022-04-14 NOTE — Assessment & Plan Note (Signed)
-   Continue Eliquis and metoprolol 

## 2022-04-14 NOTE — Assessment & Plan Note (Signed)
-   Patient has reportedly had several mechanical falls in the past few days.  She does not remember falling today but it was reported in the ER that she did fall - CT head and C-spine negative for acute changes - PT eval and treat - Likely fall secondary to benzo overdose - Continue to monitor

## 2022-04-14 NOTE — Assessment & Plan Note (Signed)
-   Reports she has not had a bowel movement in 6 days - Last time she took a stool softener was 3 days ago - Start Senokot if that is unsuccessful will escalate to something stronger

## 2022-04-14 NOTE — ED Notes (Signed)
Azithromycin infusion continued to floor

## 2022-04-14 NOTE — ED Notes (Signed)
Patient is being discharged from the Urgent Care and sent to the Emergency Department via [facility] . Per NP, patient is in need of higher level of care due to hypoxia. Patient is aware and verbalizes understanding of plan of care.  Vitals:   04/14/22 1452 04/14/22 1459  BP:  102/67  Pulse:  89  Resp:  14  Temp:    SpO2: 98% 98%

## 2022-04-14 NOTE — Assessment & Plan Note (Signed)
Continue metoprolol. 

## 2022-04-14 NOTE — H&P (Signed)
History and Physical    Patient: Megan [name] [id number] DOB: 1951/01/05 DOA: 04/14/2022 DOS: the patient was seen and examined on 04/14/2022 PCP: Lemmie Evens, MD  Patient coming from: Home  Chief Complaint:  Chief Complaint  Patient presents with   Weakness   HPI: Megan [name] is a 71 y.o. female with medical history significant of history of anxiety and depression, hypertension, diabetes mellitus type 2, GERD, hypothyroidism, paroxysmal atrial fibrillation, stroke, and more presents the ED with a chief complaint of somnolence and lethargy.  Husband is not at bedside at the time of my exam.  He had reported to the ED provider that patient was brought in because she was somnolent and lethargic throughout the day. Review of Systems: {ROS_Text:26778} Past Medical History:  Diagnosis Date   Agoraphobia with panic disorder    Anxiety    Arterial hypotension 12/20/2017   Arthritis    Chronic pain 03/08/15   Hydrocodone-APAP 10/325 mg, # 120 q month   Colon polyps    Complication of anesthesia    Depression    DM (diabetes mellitus) ([place])    GERD (gastroesophageal reflux disease)    HTN (hypertension)    Hypothyroidism    Obesity    PAF (paroxysmal atrial fibrillation) (HCC)    PONV (postoperative nausea and vomiting)    Stroke ([place]) 08/20/2014   some memory loss.   Past Surgical History:  Procedure Laterality Date   ABDOMINAL HYSTERECTOMY  15 yrs ago   with bso at [facility]  2004   normal   CATARACT EXTRACTION W/PHACO Right 11/10/2019   Procedure: CATARACT EXTRACTION PHACO AND INTRAOCULAR LENS PLACEMENT RIGHT EYE;  Surgeon: Baruch Goldmann, MD;  Location: [facility];  Service: Ophthalmology;  Laterality: Right;  CDE: 46.63   CATARACT EXTRACTION W/PHACO Left 11/24/2019   Procedure: CATARACT EXTRACTION PHACO AND INTRAOCULAR LENS PLACEMENT (I[facility]) CDE: 7.95;  Surgeon: Baruch Goldmann, MD;  Location: [facility];  Service: Ophthalmology;  Laterality: Left;    CHOLECYSTECTOMY  25 yrs ago   [facility]   COLONOSCOPY  05/04/11   left-sided colonic diverticulosis   COLONOSCOPY N/A 01/27/2016   diverticulosis in sigmoid colon and descending colon, active bleeding Dieulafoy lesion s/p clips X 3, next colonoscopy in 5 years due to family history of colon cancer     ESOPHAGOGASTRODUODENOSCOPY  05/04/2011   Abnormal distal esophagus, biopsies consistent with inflammation due to acid reflux. No evidence of Barrett's. No H. pylori   ESOPHAGOGASTRODUODENOSCOPY N/A 01/22/2016   erosive gastropathy, non-bleeding gastric ulcers and 1 oozing gastric ulcer with adherent clot s/p heater probe. Negative H.pylori serology.    ESOPHAGOGASTRODUODENOSCOPY (EGD) WITH PROPOFOL N/A 04/27/2016   Dr. Gala Romney: healed ulcers. normal esophagus   RADIOLOGY WITH ANESTHESIA N/A 08/20/2015   Procedure: RADIOLOGY WITH ANESTHESIA;  Surgeon: Luanne Bras, MD;  Location: [facility];  Service: Radiology;  Laterality: N/A;   Social History:  reports that she quit smoking about 50 years ago. Her smoking use included cigarettes. She started smoking about 52 years ago. She has a 3.75 pack-year smoking history. She quit smokeless tobacco use about 7 years ago.  Her smokeless tobacco use included snuff. She reports that she does not drink alcohol and does not use drugs.  Allergies  Allergen Reactions   Naproxen Other (See Comments)    Causes stomach burning    Family History  Problem Relation Age of Onset   Colon cancer Paternal Aunt        2, colon cancer  Colon cancer Paternal Uncle        44, colon cancer   Colon cancer Father        deceased, age 20   Colon cancer Other        numerous cousins died before age 85 with colon cancer   Colon cancer Sister        deceased, age 44   Anesthesia problems Neg Hx    Hypotension Neg Hx    Malignant hyperthermia Neg Hx    Pseudochol deficiency Neg Hx     Prior to Admission medications   Medication Sig Start Date End Date Taking? Authorizing  Provider  acetaminophen (TYLENOL) 325 MG tablet Take 2 tablets (650 mg total) by mouth every 6 (six) hours as needed for mild pain, moderate pain or headache. 11/19/17  Yes Johnson, Clanford L, MD  apixaban (ELIQUIS) 5 MG TABS tablet Take 1 tablet (5 mg total) by mouth 2 (two) times daily. Restart on 7/14 Patient taking differently: Take 5 mg by mouth daily. 01/27/16  Yes Kathie Dike, MD  Cholecalciferol (VITAMIN D-3 PO) Take 2,000 Units by mouth daily.    Yes [provider]  clonazePAM (KLONOPIN) 1 MG tablet Take 1 mg by mouth 2 (two) times daily as needed for anxiety.   Yes [provider]  Cyanocobalamin (B-12 PO) Take by mouth daily.   Yes [provider]  escitalopram (LEXAPRO) 10 MG tablet Take 1 tablet (10 mg total) by mouth daily. Patient taking differently: Take 20 mg by mouth daily. 11/19/17  Yes Johnson, Clanford L, MD  fluticasone (FLONASE) 50 MCG/ACT nasal spray Place 2 sprays into both nostrils daily. 03/22/22  Yes [provider]  furosemide (LASIX) 40 MG tablet Take 40-80 mg by mouth daily as needed. 03/22/22  Yes [provider]  JARDIANCE 10 MG TABS tablet Take 10 mg by mouth every morning. 03/22/22  Yes [provider]  levothyroxine (SYNTHROID, LEVOTHROID) 25 MCG tablet Take 25 mcg by mouth daily. 07/14/15  Yes [provider]  metFORMIN (GLUCOPHAGE) 500 MG tablet Take 1,000 mg by mouth 2 (two) times daily. 03/22/22  Yes [provider]  metoprolol succinate (TOPROL-XL) 50 MG 24 hr tablet Take 1 tablet (50 mg total) by mouth daily with lunch. 12/22/17  Yes Johnson, Clanford L, MD  ondansetron (ZOFRAN) 4 MG tablet 4 mg every 8 (eight) hours as needed for vomiting or nausea. 03/22/22  Yes [provider]  oxyCODONE-acetaminophen (PERCOCET/ROXICET) 5-325 MG tablet Take 1 tablet by mouth every 6 (six) hours as needed for moderate pain. 03/08/22  Yes [provider]  predniSONE (DELTASONE) 5 MG tablet Take 5  mg by mouth daily.   Yes [provider]    Physical Exam: Vitals:   04/14/22 1700 04/14/22 1900 04/14/22 1949 04/14/22 2051  BP: (!) 116/51 118/63  132/71  Pulse: 64 66 65 64  Resp: 17 15 16 16   Temp:      TempSrc:      SpO2: 99% (!) 89% 96% 100%   *** Data Reviewed: {Tip this will not be part of the note when signed- Document your independent interpretation of telemetry tracing, EKG, lab, Radiology test or any other diagnostic tests. Add any new diagnostic test ordered today. (Optional):26781} {Results:26384}  Assessment and Plan: * Acute metabolic encephalopathy - Toxic encephalopathy secondary to unintentional overdose of benzodiazepines -Patient reports that she took 4 mg of Klonopin last night - Husband reported that she was somnolent lethargic throughout the day -  Patient does not remember being that way, but is alert and oriented at this time - UDS pending - EKG shows QT prolongation, will monitor on telemetry - Given that she has hypothyroidism we will also check TSH - CT head negative - Continue to monitor  Benzodiazepine overdose - No intent of self-harm - Patient took 2 mg of Klonopin at 9 PM and when she could not sleep at 1 AM she took another 2 mg of Klonopin - Patient was not somnolent and lethargic throughout the day - Patient did have a fall as result of this - EKG does have QTc prolongation - Monitor on telemetry, hold home benzo, continue to monitor  Fall at home, initial encounter - Patient has reportedly had several mechanical falls in the past few days.  She does not remember falling today but it was reported in the ER that she did fall - CT head and C-spine negative for acute changes - PT eval and treat - Likely fall secondary to benzo overdose - Continue to monitor  Peripheral edema - 2+ pitting edema - Patient has a prescription for 40-80 mg of Lasix at home - Patient reports that she was told only to take 20 mg of Lasix, so that is  what she is been taking - BNP is also elevated at 539 - Last echo was in 2019 and showed normal systolic function was not able to evaluate diastolic dysfunction - Update echo - 20 mg IV Lasix given in the ED - Monitor ins and outs - Continue to monitor  Hypothyroidism - Check TSH - Continue Synthroid  CAP (community acquired pneumonia) - CTA was done that did not show PE but did show a left lower lobe infiltrate -Rocephin and Zithromax started -Procalcitonin per lower respiratory tract algorithm has been ordered -Sputum culture ordered - Blood culture ordered - Urine strep and Legionella antigens ordered -Continue to monitor  Atrial fibrillation, chronic (HCC) - Continue Eliquis and metoprolol  Constipation - Reports she has not had a bowel movement in 6 days - Last time she took a stool softener was 3 days ago - Start Senokot if that is unsuccessful will escalate to something stronger  Anxiety and depression - Holding Klonopin due to overdose - Continue Lexapro - Continue to monitor  Benign essential HTN - Continue metoprolol  DM type 2 (diabetes mellitus, type 2) ([place]) - Home oral hypoglycemics - Sliding scale coverage - Modified diet - Last hemoglobin A1c 6.8 - Continue to monitor      Advance Care Planning:   Code Status: Prior ***  Consults: ***  Family Communication: ***  Severity of Illness: {Observation/Inpatient:21159}  Author: Rolla Plate, DO 04/14/2022 9:06 PM  For on call review [email or web address].

## 2022-04-14 NOTE — Assessment & Plan Note (Signed)
-   Toxic encephalopathy secondary to unintentional overdose of benzodiazepines -Patient reports that she took 4 mg of Klonopin last night - Husband reported that she was somnolent lethargic throughout the day - Patient does not remember being that way, but is alert and oriented at this time - UDS pending - EKG shows QT prolongation, will monitor on telemetry - Given that she has hypothyroidism we will also check TSH - CT head negative - Continue to monitor

## 2022-04-14 NOTE — Assessment & Plan Note (Signed)
Check TSH.  Continue Synthroid. 

## 2022-04-14 NOTE — ED Provider Notes (Signed)
Patient presents to urgent care with husband for progressive weakness, lower extremity swelling, and fatigue.  Husband reports that she was recently discharged from the hospital for pneumonia, went home, did not do physical therapy, and has gotten worse.  In triage, patient is hypoxic into the low to mid 80s on room air and borderline hypotensive.  Patient placed on a nonrebreather at 10 L with oxygen saturation increasing up to 98%.  Patient fell asleep multiple times during conversation today.  EMS called and activated to transfer patient to nearest emergency room.  I am concerned for hypoxemia, possible recurrent pneumonia, pulmonary embolism, DVT, as well as congested heart failure.  Patient and husband under agreement to transport via EMS.  Patient left urgent care in stable condition via EMS.   Eulogio Bear, NP 04/14/22 [phone]

## 2022-04-14 NOTE — Assessment & Plan Note (Signed)
-   No intent of self-harm - Patient took 2 mg of Klonopin at 9 PM and when she could not sleep at 1 AM she took another 2 mg of Klonopin - Patient was not somnolent and lethargic throughout the day - Patient did have a fall as result of this - EKG does have QTc prolongation - Monitor on telemetry, hold home benzo, continue to monitor

## 2022-04-14 NOTE — ED Triage Notes (Signed)
BIB EMS from [facility] for complaints of sore throat, headache, generalized weakness, for the past three days. While at [facility], sats dropped into the 80's. Does not wear oxygen normally. Patient states that she could not sleep last night so she took 3 more Klonopin than normal. Patient appears lethargic. Alert and oriented x4. Denies she took meds in attempt to harm self. EDP in room at time of triage to assess patient. Reports multiple falls yesterday, states that she takes a blood thinner. Reports hitting head, no LOC.

## 2022-04-14 NOTE — ED Triage Notes (Signed)
Bilateral leg swelling x 1 week.  States throat is sore x 3 days.  Hx of pneumonia and MRSA was in hospital last week.  O2 sat low during triage at 88%.  Patient placed on non-breather O2 up to 98%

## 2022-04-14 NOTE — ED Provider Notes (Signed)
[facility] EMERGENCY DEPARTMENT Provider Note   CSN: [id number] Arrival date & time: 04/14/22  1523     History {Add pertinent medical, surgical, social history, OB history to HPI:1} Chief Complaint  Patient presents with   Weakness    Megan [name] is a 71 y.o. female.   Weakness   71 year old female presents emergency department after being seen by an urgent care earlier today.  She presented to the urgent care with her husband with complaints of generalized weakness, lower extremity swelling.  Patient was recently hospitalized on 03/30/2022 for concerns of concerns of generalized weakness, hypoxia as well as hypotension.  Patient states that she has been doing well outpatient with home up until approximately 2 to 3 days ago.  She secondarily states that she has had 2-3 falls over the past few days which have been mechanical in nature.  She describes her most recent fall as being transitioning from her recliner to her bed when she did not see a blanket on the ground.  She then slept falling and hitting her head on the floor.  She denies loss of consciousness.  She states she is able to get up unassisted and was not concerned so she did not come to the emergency department.  She also states that last night, she was having difficulty sleeping and took 4 mg of Klonopin around 9 PM yesterday evaluation which helped her sleep but she states that since then, she has "felt sleepy."  She denies taking amount of suicidal ideation but states that she "just could not sleep."  She also states that she has had this right-sided headache for the past couple days it feels similar to headaches she has had in the past.  She notes gradual onset but has been persistent since onset.  She denies any neurologic deficits.  Denies changes in vision, slurred speech, facial droop, weakness/sensory deficits, difficulty walking from baseline.  She also states that she has had 2 days of sore throat but has been able  to eat and drink without difficulty.  She also states she has not had any difficulty breathing.  Denies fever, chills, neck pain/stiffness, cough, congestion, chest pain, shortness of breath, abdominal pain, nausea, vomiting, urinary/vaginal symptoms, change in bowel habits.  Denies alcohol/illicit drug use.  Patient on no at home oxygen but saturating 95-96 on 2 L nasal cannula.   Past medical history significant for paroxysmal atrial fibrillation of which she is on Eliquis, diabetes mellitus, CVA, hypertension, hypothyroidism, arterial hypotension, chronic steroid use for osteoarthritis  Home Medications Prior to Admission medications   Medication Sig Start Date End Date Taking? Authorizing Provider  acetaminophen (TYLENOL) 325 MG tablet Take 2 tablets (650 mg total) by mouth every 6 (six) hours as needed for mild pain, moderate pain or headache. Patient not taking: Reported on 03/30/2022 11/19/17   Cleora Fleet, MD  apixaban (ELIQUIS) 5 MG TABS tablet Take 1 tablet (5 mg total) by mouth 2 (two) times daily. Restart on 7/14 Patient taking differently: Take 5 mg by mouth daily. 01/27/16   Erick Blinks, MD  Cholecalciferol (VITAMIN D-3 PO) Take 2,000 Units by mouth daily.     [provider]  clonazePAM (KLONOPIN) 1 MG tablet Take 1 mg by mouth 2 (two) times daily as needed for anxiety.    [provider]  Cyanocobalamin (B-12 PO) Take by mouth daily.    [provider]  escitalopram (LEXAPRO) 10 MG tablet Take 1 tablet (10 mg total) by mouth  daily. Patient taking differently: Take 20 mg by mouth daily. 11/19/17   Johnson, Clanford L, MD  fluticasone (FLONASE) 50 MCG/ACT nasal spray Place 2 sprays into both nostrils daily. 03/22/22   [provider]  furosemide (LASIX) 40 MG tablet Take 40-80 mg by mouth daily as needed. 03/22/22   [provider]  JARDIANCE 10 MG TABS tablet Take 10 mg by mouth every morning. 03/22/22   [provider]   levothyroxine (SYNTHROID, LEVOTHROID) 25 MCG tablet Take 25 mcg by mouth daily. 07/14/15   [provider]  metFORMIN (GLUCOPHAGE) 500 MG tablet Take 1,000 mg by mouth 2 (two) times daily. 03/22/22   [provider]  metoprolol succinate (TOPROL-XL) 50 MG 24 hr tablet Take 1 tablet (50 mg total) by mouth daily with lunch. 12/22/17   Johnson, Clanford L, MD  ondansetron (ZOFRAN) 4 MG tablet 4 mg every 8 (eight) hours as needed for vomiting or nausea. 03/22/22   [provider]  oxyCODONE-acetaminophen (PERCOCET/ROXICET) 5-325 MG tablet Take 1 tablet by mouth 3 (three) times daily. 03/08/22   [provider]  predniSONE (DELTASONE) 5 MG tablet Take 5 mg by mouth daily.    [provider]      Allergies    Naproxen    Review of Systems   Review of Systems  Neurological:  Positive for weakness.  All other systems reviewed and are negative.   Physical Exam Updated Vital Signs Temp 99.4 F (37.4 C) (Oral)  Physical Exam Vitals and nursing note reviewed.  Constitutional:      General: She is not in acute distress.    Appearance: She is well-developed.     Comments: Patient alert and orientated x4.  She answers questions appropriately and without difficulty.  Most of exam, eyes are shut.  HENT:     Head: Normocephalic and atraumatic.     Right Ear: Tympanic membrane normal.     Ears:     Comments: Serous fluid behind left TM with no obvious exudate.  TM not erythematic in appearance.    Mouth/Throat:     Comments: Mild posterior pharyngeal erythema with no obvious exudate.  Uvula midline rises symmetrically with phonation. Eyes:     Extraocular Movements: Extraocular movements intact.     Conjunctiva/sclera: Conjunctivae normal.     Pupils: Pupils are equal, round, and reactive to light.  Cardiovascular:     Rate and Rhythm: Normal rate and regular rhythm.     Pulses: Normal pulses.  Pulmonary:     Effort: Pulmonary effort is normal. No  respiratory distress.     Breath sounds: Normal breath sounds.     Comments: Decreased lung sounds in bilateral lower lung fields.  *** Abdominal:     Palpations: Abdomen is soft.     Tenderness: There is no abdominal tenderness. There is no right CVA tenderness, left CVA tenderness or guarding.  Musculoskeletal:        General: No swelling.     Cervical back: Neck supple.     Right lower leg: Edema present.     Left lower leg: Edema present.     Comments: 0-1+ bilateral lower extremity edema noted.  Skin:    General: Skin is warm and dry.     Capillary Refill: Capillary refill takes less than 2 seconds.  Neurological:     Mental Status: She is alert.  Psychiatric:        Mood and Affect: Mood normal.    ED Results /  Procedures / Treatments   Labs (all labs ordered are listed, but only abnormal results are displayed) Labs Reviewed  COMPREHENSIVE METABOLIC PANEL  CBC WITH DIFFERENTIAL/PLATELET  LACTIC ACID, PLASMA  LACTIC ACID, PLASMA    EKG None  Radiology No results found.  Procedures Procedures  {Document cardiac monitor, telemetry assessment procedure when appropriate:1}  Medications Ordered in ED Medications  sodium chloride 0.9 % bolus 1,000 mL (has no administration in time range)    ED Course/ Medical Decision Making/ A&P Clinical Course as of 04/14/22 1953  Fri Apr 14, 2022  1829 Talk to poison control regarding the patient.  They agreed with worse of [CR]  1935 Consulted hospital medicine Dr. Clearence Ped who agreed with admission of the patient as to further treatment/care. [CR]    Clinical Course User Index [CR] Wilnette Kales, PA                           Medical Decision Making Amount and/or Complexity of Data Reviewed Labs: ordered. Decision-making details documented in ED Course. Radiology: ordered.  Risk Prescription drug management. Decision regarding hospitalization.   This patient presents to the ED for concern of ***, this  involves an extensive number of treatment options, and is a complaint that carries with it a high risk of complications and morbidity.  The differential diagnosis includes ***   Co morbidities that complicate the patient evaluation  See HPI   Additional history obtained:  Additional history obtained from EMR External records from outside source obtained and reviewed including ***   Lab Tests:  I Ordered, and personally interpreted labs.  The pertinent results include:  ***   Imaging Studies ordered:  I ordered imaging studies including ***  I independently visualized and interpreted imaging which showed *** I agree with the radiologist interpretation   Cardiac Monitoring: / EKG:  The patient was maintained on a cardiac monitor.  I personally viewed and interpreted the cardiac monitored which showed an underlying rhythm of: ***   Consultations Obtained:  I requested consultation with the ***,  and discussed lab and imaging findings as well as pertinent plan - they recommend: ***   Problem List / ED Course / Critical interventions / Medication management  *** I ordered medication including ***  for ***  Reevaluation of the patient after these medicines showed that the patient {resolved/improved/worsened:23923::"improved"} I have reviewed the patients home medicines and have made adjustments as needed   Social Determinants of Health:  ***   Test / Admission - Considered:  Vitals signs significant for ***. Otherwise within normal range and stable throughout visit. Laboratory/imaging studies significant for: *** *** Treatment plan were discussed at length with patient and they knowledge understanding was agreeable to said plan.  Appropriate consultations were made as described in the ED course.  Patient was stable upon admission to the hospital.   {Document critical care time when appropriate:1} {Document review of labs and clinical decision tools ie heart score,  Chads2Vasc2 etc:1}  {Document your independent review of radiology images, and any outside records:1} {Document your discussion with family members, caretakers, and with consultants:1} {Document social determinants of health affecting pt's care:1} {Document your decision making why or why not admission, treatments were needed:1} Final Clinical Impression(s) / ED Diagnoses Final diagnoses:  None    Rx / DC Orders ED Discharge Orders     None

## 2022-04-15 ENCOUNTER — Other Ambulatory Visit (HOSPITAL_COMMUNITY): Payer: Self-pay | Admitting: *Deleted

## 2022-04-15 ENCOUNTER — Inpatient Hospital Stay (HOSPITAL_COMMUNITY): Payer: Medicare Other

## 2022-04-15 DIAGNOSIS — G9341 Metabolic encephalopathy: Secondary | ICD-10-CM | POA: Diagnosis not present

## 2022-04-15 DIAGNOSIS — R601 Generalized edema: Secondary | ICD-10-CM

## 2022-04-15 LAB — CBC WITH DIFFERENTIAL/PLATELET
Abs Immature Granulocytes: 0.01 10*3/uL (ref 0.00–0.07)
Basophils Absolute: 0 10*3/uL (ref 0.0–0.1)
Basophils Relative: 0 %
Eosinophils Absolute: 0.1 10*3/uL (ref 0.0–0.5)
Eosinophils Relative: 2 %
HCT: 29.5 % — ABNORMAL LOW (ref 36.0–46.0)
Hemoglobin: 9.5 g/dL — ABNORMAL LOW (ref 12.0–15.0)
Immature Granulocytes: 0 %
Lymphocytes Relative: 25 %
Lymphs Abs: 1.5 10*3/uL (ref 0.7–4.0)
MCH: 31.1 pg (ref 26.0–34.0)
MCHC: 32.2 g/dL (ref 30.0–36.0)
MCV: 96.7 fL (ref 80.0–100.0)
Monocytes Absolute: 0.5 10*3/uL (ref 0.1–1.0)
Monocytes Relative: 9 %
Neutro Abs: 3.8 10*3/uL (ref 1.7–7.7)
Neutrophils Relative %: 64 %
Platelets: 144 10*3/uL — ABNORMAL LOW (ref 150–400)
RBC: 3.05 MIL/uL — ABNORMAL LOW (ref 3.87–5.11)
RDW: 14.6 % (ref 11.5–15.5)
WBC: 6 10*3/uL (ref 4.0–10.5)
nRBC: 0 % (ref 0.0–0.2)

## 2022-04-15 LAB — COMPREHENSIVE METABOLIC PANEL
ALT: 9 U/L (ref 0–44)
AST: 13 U/L — ABNORMAL LOW (ref 15–41)
Albumin: 2.7 g/dL — ABNORMAL LOW (ref 3.5–5.0)
Alkaline Phosphatase: 109 U/L (ref 38–126)
Anion gap: 7 (ref 5–15)
BUN: 7 mg/dL — ABNORMAL LOW (ref 8–23)
CO2: 28 mmol/L (ref 22–32)
Calcium: 7.6 mg/dL — ABNORMAL LOW (ref 8.9–10.3)
Chloride: 104 mmol/L (ref 98–111)
Creatinine, Ser: 1.39 mg/dL — ABNORMAL HIGH (ref 0.44–1.00)
GFR, Estimated: 41 mL/min — ABNORMAL LOW (ref >60)
Glucose, Bld: 84 mg/dL (ref 70–99)
Potassium: 3.2 mmol/L — ABNORMAL LOW (ref 3.5–5.1)
Sodium: 139 mmol/L (ref 135–145)
Total Bilirubin: 0.7 mg/dL (ref 0.3–1.2)
Total Protein: 6 g/dL — ABNORMAL LOW (ref 6.5–8.1)

## 2022-04-15 LAB — ECHOCARDIOGRAM COMPLETE
Area-P 1/2: 5.84 cm2
Height: 62 in
S' Lateral: 3.1 cm
Weight: 3026.47 oz

## 2022-04-15 LAB — MAGNESIUM: Magnesium: 1.1 mg/dL — ABNORMAL LOW (ref 1.7–2.4)

## 2022-04-15 LAB — GLUCOSE, CAPILLARY
Glucose-Capillary: 89 mg/dL (ref 70–99)
Glucose-Capillary: 136 mg/dL — ABNORMAL HIGH (ref 70–99)
Glucose-Capillary: 79 mg/dL (ref 70–99)
Glucose-Capillary: 96 mg/dL (ref 70–99)

## 2022-04-15 LAB — TSH: TSH: 0.879 u[IU]/mL (ref 0.350–4.500)

## 2022-04-15 LAB — STREP PNEUMONIAE URINARY ANTIGEN: Strep Pneumo Urinary Antigen: NEGATIVE

## 2022-04-15 MED ORDER — MAGNESIUM SULFATE 4 GM/100ML IV SOLN
4.0000 g | Freq: Once | INTRAVENOUS | Status: AC
  Administered 2022-04-15: 4 g via INTRAVENOUS
  Filled 2022-04-15: qty 100

## 2022-04-15 MED ORDER — NICOTINE 21 MG/24HR TD PT24
21.0000 mg | MEDICATED_PATCH | Freq: Every day | TRANSDERMAL | Status: DC
  Administered 2022-04-15 – 2022-04-16 (×2): 21 mg via TRANSDERMAL
  Filled 2022-04-15 (×2): qty 1

## 2022-04-15 MED ORDER — LACTULOSE 10 GM/15ML PO SOLN
30.0000 g | Freq: Once | ORAL | Status: AC
  Administered 2022-04-15: 30 g via ORAL
  Filled 2022-04-15: qty 60

## 2022-04-15 MED ORDER — POLYETHYLENE GLYCOL 3350 17 G PO PACK
17.0000 g | PACK | Freq: Every day | ORAL | Status: DC
  Administered 2022-04-15: 17 g via ORAL
  Filled 2022-04-15 (×2): qty 1

## 2022-04-15 MED ORDER — POTASSIUM CHLORIDE CRYS ER 20 MEQ PO TBCR
40.0000 meq | EXTENDED_RELEASE_TABLET | ORAL | Status: AC
  Administered 2022-04-15 (×2): 40 meq via ORAL
  Filled 2022-04-15 (×2): qty 2

## 2022-04-15 MED ORDER — TRAZODONE HCL 50 MG PO TABS
100.0000 mg | ORAL_TABLET | Freq: Every day | ORAL | Status: DC
  Administered 2022-04-15: 100 mg via ORAL
  Filled 2022-04-15: qty 2

## 2022-04-15 NOTE — Progress Notes (Signed)
PROGRESS NOTE     Megan [name], is a 71 y.o. female, DOB - [birth year], ZOX:[id number]  Admit date - 04/14/2022   Admitting Physician Lilyan Gilford, DO  Outpatient Primary MD for the patient is [name], MD  LOS - 1  Chief Complaint  Patient presents with   Weakness        Brief Narrative:  71 y.o. female with medical history significant of history of anxiety and depression, hypertension, diabetes mellitus type 2, GERD, hypothyroidism, paroxysmal atrial fibrillation and prior history of stroke admitted on 04/14/2022 with acute hypoxic respiratory failure and acute metabolic encephalopathy presumably due to benzo overuse    -Assessment and Plan: 1) Acute metabolic encephalopathy/psychosis - UDS with opiates and benzos -UA with moderate leukocyte esterase rare bacteria and 6-10 WBC -Husband reports that patient took more than prescribed -Visual hallucinations , confusion and disorientation persist -CT head without acute findings -Continue Rocephin pending urine culture data -CTA chest with possible pneumonia--- continue Rocephin and azithromycin  2)Acute respiratory failure with hypoxia (HCC) - -In the setting of benzo and opiate use and finding of possible pneumonia on CTA chest -Hypoxia has resolved  3) unintentional/accidental benzodiazepine and opiate overdose - No intent of self-harm - Patient took 2 mg of Klonopin at 9 PM and when she could not sleep at 1 AM she took another 2 mg of Klonopin Patient also took opiates- - Patient was not somnolent and lethargic throughout the day - Patient did have a fall as result of this -Be judicious with opiates and benzos  4)Fall at home, initial encounter - Patient has reportedly had several mechanical falls in the past few days.  She does not remember falling today but it was reported in the ER that she did fall - CT head and C-spine negative for acute changes -Suspect related to benzo/opiate use -Get PT  eval  Peripheral edema - 2+ pitting edema BNP 539 which is lower than prior baseline of 586 from 04/02/2022 -Chest x-ray and CTA chest without CHF type findings - Echo preserved EF of 70 to 75%, actually hyperdynamic -PTA patient used as needed Lasix  Hypothyroidism - TSH 0.89 - Continue Synthroid  Possible pneumonia- -CTA chest with possible left-sided pneumonia -Management as above #1  Atrial fibrillation, chronic (HCC) - Continue Eliquis and metoprolol  Constipation - Reports she has not had a bowel movement in 6 days -Tried stool softeners at home without relief -Give 1 dose of lactulose -Give MiraLAX and Senokot  Anxiety and depression --Judicious with benzos - Continue Lexapro - Continue to monitor  Benign essential HTN - Continue metoprolol  DM type 2 (diabetes mellitus, type 2) (HCC) -A1c 6.8 reflecting fair diabetic control PTA Use Novolog/Humalog Sliding scale insulin with Accu-Cheks/Fingersticks as ordered    Disposition/Need for in-Hospital Stay- patient unable to be discharged at this time due to acute metabolic encephalopathy--- suspect due to combination of infection and benzos and opiate use -Continue antibiotics pending culture data  Status is: Inpatient   Disposition: The patient is from: Home              Anticipated d/c is to: Home              Anticipated d/c date is: 1 day              Patient currently is not medically stable to d/c. Barriers: Not Clinically Stable-   Code Status :  -  Code Status: Full Code   Family Communication:  Discussed with husband at bedside  DVT Prophylaxis  :   - SCDs   SCDs Start: 04/14/22 2131 apixaban (ELIQUIS) tablet 5 mg   Lab Results  Component Value Date   PLT 144 (L) 04/15/2022    Inpatient Medications  Scheduled Meds:  apixaban  5 mg Oral Daily   escitalopram  10 mg Oral Daily   influenza vaccine adjuvanted  0.5 mL Intramuscular Tomorrow-1000   insulin aspart  0-15 Units Subcutaneous TID WC    insulin aspart  0-5 Units Subcutaneous QHS   levothyroxine  25 mcg Oral Daily   metoprolol succinate  50 mg Oral Q lunch   nicotine  21 mg Transdermal Daily   pneumococcal 20-valent conjugate vaccine  0.5 mL Intramuscular Tomorrow-1000   polyethylene glycol  17 g Oral Daily   senna-docusate  1 tablet Oral QHS   traZODone  100 mg Oral QHS   Continuous Infusions:  azithromycin 500 mg (04/15/22 1750)   cefTRIAXone (ROCEPHIN)  IV Stopped (04/14/22 2021)   PRN Meds:.acetaminophen **OR** acetaminophen, albuterol, ondansetron **OR** ondansetron (ZOFRAN) IV, oxyCODONE, phenol   Anti-infectives (From admission, onward)    Start     Dose/Rate Route Frequency Ordered Stop   04/14/22 1930  cefTRIAXone (ROCEPHIN) 1 g in sodium chloride 0.9 % 100 mL IVPB        1 g 200 mL/hr over 30 Minutes Intravenous Every 24 hours 04/14/22 1916     04/14/22 1930  azithromycin (ZITHROMAX) 500 mg in sodium chloride 0.9 % 250 mL IVPB        500 mg 250 mL/hr over 60 Minutes Intravenous Every 24 hours 04/14/22 1916           Subjective: [place] today has no fevers, no emesis,  No chest pain,   - Confusional episodes with disorientation persist, episodes of visual hallucinations as well -Cough is not productive -Husband at bedside, questions answered   Objective: Vitals:   04/15/22 0212 04/15/22 0540 04/15/22 0615 04/15/22 1546  BP: 109/60 (!) 142/73  (!) 94/48  Pulse: 67 74  63  Resp: 20 20  20   Temp: 98.4 F (36.9 C) 98.8 F (37.1 C)  (!) 97.4 F (36.3 C)  TempSrc: Oral Oral    SpO2: 97% 94%  95%  Weight:   85.8 kg   Height:   5\' 2"  (1.575 m)     Intake/Output Summary (Last 24 hours) at 04/15/2022 1807 Last data filed at 04/15/2022 0658 Gross per 24 hour  Intake 429.71 ml  Output 2000 ml  Net -1570.29 ml   Filed Weights   04/15/22 0615  Weight: 85.8 kg    Physical Exam  Gen:- Awake Alert,  in no apparent distress  HEENT:- .AT, No sclera icterus Neck-Supple Neck,No  JVD,.  Lungs-  CTAB , fair symmetrical air movement CV- S1, S2 normal, regular  Abd-  +ve B.Sounds, Abd Soft, No tenderness,    Extremity/Skin:- No  edema, pedal pulses present  Psych--Confusional episodes with disorientation persist, episodes of visual hallucinations as well Neuro-no new focal deficits, no tremors  Data Reviewed: I have personally reviewed following labs and imaging studies  CBC: Recent Labs  Lab 04/14/22 1628 04/15/22 0529  WBC 8.2 6.0  NEUTROABS 6.7 3.8  HGB 10.2* 9.5*  HCT 32.2* 29.5*  MCV 98.5 96.7  PLT 161 518*   Basic Metabolic Panel: Recent Labs  Lab 04/14/22 1628 04/15/22 0529  NA 137 139  K 3.5 3.2*  CL 101 104  CO2 26  28  GLUCOSE 120* 84  BUN 6* 7*  CREATININE 1.48* 1.39*  CALCIUM 7.7* 7.6*  MG  --  1.1*   GFR: Estimated Creatinine Clearance: 37.7 mL/min (A) (by C-G formula based on SCr of 1.39 mg/dL (H)). Liver Function Tests: Recent Labs  Lab 04/14/22 1628 04/15/22 0529  AST 16 13*  ALT 10 9  ALKPHOS 117 109  BILITOT 1.0 0.7  PROT 6.2* 6.0*  ALBUMIN 2.9* 2.7*   Recent Results (from the past 240 hour(s))  Resp Panel by RT-PCR (Flu A&B, Covid) Anterior Nasal Swab     Status: None   Collection Time: 04/14/22  4:21 PM   Specimen: Anterior Nasal Swab  Result Value Ref Range Status   SARS Coronavirus 2 by RT PCR NEGATIVE NEGATIVE Final    Comment: (NOTE) SARS-CoV-2 target nucleic acids are NOT DETECTED.  The SARS-CoV-2 RNA is generally detectable in upper respiratory specimens during the acute phase of infection. The lowest concentration of SARS-CoV-2 viral copies this assay can detect is 138 copies/mL. A negative result does not preclude SARS-Cov-2 infection and should not be used as the sole basis for treatment or other patient management decisions. A negative result may occur with  improper specimen collection/handling, submission of specimen other than nasopharyngeal swab, presence of viral mutation(s) within the areas  targeted by this assay, and inadequate number of viral copies(<138 copies/mL). A negative result must be combined with clinical observations, patient history, and epidemiological information. The expected result is Negative.  Fact Sheet for Patients:  BloggerCourse.comhttps://www.fda.gov/media/152166/download  Fact Sheet for Healthcare Providers:  SeriousBroker.ithttps://www.fda.gov/media/152162/download  This test is no t yet approved or cleared by the [place] FDA and  has been authorized for detection and/or diagnosis of SARS-CoV-2 by FDA under an Emergency Use Authorization (EUA). This EUA will remain  in effect (meaning this test can be used) for the duration of the COVID-19 declaration under Section 564(b)(1) of the Act, 21 U.S.C.section 360bbb-3(b)(1), unless the authorization is terminated  or revoked sooner.       Influenza A by PCR NEGATIVE NEGATIVE Final   Influenza B by PCR NEGATIVE NEGATIVE Final    Comment: (NOTE) The Xpert Xpress SARS-CoV-2/FLU/RSV plus assay is intended as an aid in the diagnosis of influenza from Nasopharyngeal swab specimens and should not be used as a sole basis for treatment. Nasal washings and aspirates are unacceptable for Xpert Xpress SARS-CoV-2/FLU/RSV testing.  Fact Sheet for Patients: BloggerCourse.comhttps://www.fda.gov/media/152166/download  Fact Sheet for Healthcare Providers: SeriousBroker.ithttps://www.fda.gov/media/152162/download  This test is not yet approved or cleared by the [place] FDA and has been authorized for detection and/or diagnosis of SARS-CoV-2 by FDA under an Emergency Use Authorization (EUA). This EUA will remain in effect (meaning this test can be used) for the duration of the COVID-19 declaration under Section 564(b)(1) of the Act, 21 U.S.C. section 360bbb-3(b)(1), unless the authorization is terminated or revoked.  Performed at [facility], [address] [place], [place] [address]   Culture, blood (Routine X 2) w Reflex to ID Panel     Status: None  (Preliminary result)   Collection Time: 04/14/22  7:28 PM   Specimen: Right Antecubital; Blood  Result Value Ref Range Status   Specimen Description RIGHT ANTECUBITAL  Final   Special Requests   Final    BOTTLES DRAWN AEROBIC ONLY Blood Culture adequate volume   Culture   Final    NO GROWTH < 12 HOURS Performed at [facility], [address] [place], [place] [address]  Report Status PENDING  Incomplete  Culture, blood (Routine X 2) w Reflex to ID Panel     Status: None (Preliminary result)   Collection Time: 04/14/22  7:37 PM   Specimen: BLOOD RIGHT FOREARM  Result Value Ref Range Status   Specimen Description BLOOD RIGHT FOREARM  Final   Special Requests   Final    BOTTLES DRAWN AEROBIC ONLY Blood Culture adequate volume   Culture   Final    NO GROWTH < 12 HOURS Performed at [facility], [address] [place], [place] [address]    Report Status PENDING  Incomplete      Radiology Studies: ECHOCARDIOGRAM COMPLETE  Result Date: 04/15/2022    ECHOCARDIOGRAM REPORT   Patient Name:   Zephaniah A Mcpheeters Date of Exam: 04/15/2022 Medical Rec #:  [id number]           Height:       62.0 in Accession #:    [id number]          Weight:       189.2 lb Date of Birth:  [birth year]           BSA:          1.867 m Patient Age:    70 years            BP:           142/73 mmHg Patient Gender: F                   HR:           74 bpm. [facility] Procedure: 2D Echo, Cardiac Doppler and Color Doppler Indications:    Edema [782.3.ICD-9-CM]  History:        Patient has prior history of Echocardiogram examinations, most                 recent 10/10/2017. Stroke; Risk Factors:Hypertension, Diabetes                 and Former Smoker. GERD. Heart Failure.  Sonographer:    Celesta Gentile RCS Referring Phys: [id number] ASIA B ZIERLE-GHOSH IMPRESSIONS  1. Left ventricular ejection fraction, by estimation, is 70 to 75%. The left ventricle has hyperdynamic function. The left ventricle has no regional wall  motion abnormalities. Left ventricular diastolic parameters are indeterminate.  2. Right ventricular systolic function is normal. The right ventricular size is normal. There is moderately elevated pulmonary artery systolic pressure.  3. Left atrial size was mildly dilated.  4. The mitral valve is grossly normal. Trivial mitral valve regurgitation. No evidence of mitral stenosis.  5. The aortic valve is tricuspid. There is mild calcification of the aortic valve. There is mild thickening of the aortic valve. Aortic valve regurgitation is not visualized. Aortic valve sclerosis/calcification is present, without any evidence of aortic stenosis.  6. The inferior vena cava is dilated in size with >50% respiratory variability, suggesting right atrial pressure of 8 mmHg. Comparison(s): Compared to prior TTE in 2019, the LV systolic function is now hyperdynamic. Otherwise, there is no significant change. FINDINGS  Left Ventricle: Left ventricular ejection fraction, by estimation, is 70 to 75%. The left ventricle has hyperdynamic function. The left ventricle has no regional wall motion abnormalities. The left ventricular internal cavity size was normal in size. There is no left ventricular hypertrophy. Left ventricular diastolic parameters are indeterminate. Right Ventricle: The right ventricular size is normal. No increase in right ventricular wall thickness. Right ventricular  systolic function is normal. There is moderately elevated pulmonary artery systolic pressure. The tricuspid regurgitant velocity is 3.27 m/s, and with an assumed right atrial pressure of 8 mmHg, the estimated right ventricular systolic pressure is 50.8 mmHg. Left Atrium: Left atrial size was mildly dilated. Right Atrium: Right atrial size was normal in size. Pericardium: There is no evidence of pericardial effusion. Mitral Valve: The mitral valve is grossly normal. There is mild thickening of the mitral valve leaflet(s). There is mild calcification of  the mitral valve leaflet(s). Mild mitral annular calcification. Trivial mitral valve regurgitation. No evidence of mitral valve stenosis. Tricuspid Valve: The tricuspid valve is normal in structure. Tricuspid valve regurgitation is mild. Aortic Valve: The aortic valve is tricuspid. There is mild calcification of the aortic valve. There is mild thickening of the aortic valve. Aortic valve regurgitation is not visualized. Aortic valve sclerosis/calcification is present, without any evidence of aortic stenosis. Pulmonic Valve: The pulmonic valve was normal in structure. Pulmonic valve regurgitation is trivial. Aorta: The aortic root and ascending aorta are structurally normal, with no evidence of dilitation. Venous: The inferior vena cava is dilated in size with greater than 50% respiratory variability, suggesting right atrial pressure of 8 mmHg. IAS/Shunts: The atrial septum is grossly normal.  LEFT VENTRICLE PLAX 2D LVIDd:         4.40 cm   Diastology LVIDs:         3.10 cm   LV e' medial:    6.42 cm/s LV PW:         1.00 cm   LV E/e' medial:  21.8 LV IVS:        1.00 cm   LV e' lateral:   7.83 cm/s LVOT diam:     1.70 cm   LV E/e' lateral: 17.9 LV SV:         59 LV SV Index:   32 LVOT Area:     2.27 cm  RIGHT VENTRICLE RV S prime:     12.70 cm/s TAPSE (M-mode): 2.1 cm LEFT ATRIUM             Index        RIGHT ATRIUM           Index LA diam:        4.20 cm 2.25 cm/m   RA Area:     15.50 cm LA Vol (A2C):   68.5 ml 36.66 ml/m  RA Volume:   37.50 ml  20.09 ml/m LA Vol (A4C):   74.0 ml 39.64 ml/m LA Biplane Vol: 78.4 ml 41.99 ml/m  AORTIC VALVE LVOT Vmax:   114.00 cm/s LVOT Vmean:  85.900 cm/s LVOT VTI:    0.262 m  AORTA Ao Root diam: 3.20 cm MITRAL VALVE                TRICUSPID VALVE MV Area (PHT): 5.84 cm     TR Peak grad:   42.8 mmHg MV Decel Time: 130 msec     TR Vmax:        327.00 cm/s MV E velocity: 140.00 cm/s MV A velocity: 42.70 cm/s   SHUNTS MV E/A ratio:  3.28         Systemic VTI:  0.26 m                              Systemic Diam: 1.70 cm Laurance Flatten MD Electronically signed by Laurance Flatten MD Signature  Date/Time: 04/15/2022/12:51:24 PM    Final    CT Angio Chest PE W/Cm &/Or Wo Cm  Result Date: 04/14/2022 CLINICAL DATA:  Sore throat with headache and generalized weakness. EXAM: CT ANGIOGRAPHY CHEST WITH CONTRAST TECHNIQUE: Multidetector CT imaging of the chest was performed using the standard protocol during bolus administration of intravenous contrast. Multiplanar CT image reconstructions and MIPs were obtained to evaluate the vascular anatomy. RADIATION DOSE REDUCTION: This exam was performed according to the departmental dose-optimization program which includes automated exposure control, adjustment of the mA and/or kV according to patient size and/or use of iterative reconstruction technique. CONTRAST:  31mL OMNIPAQUE IOHEXOL 350 MG/ML SOLN COMPARISON:  Abdomen and pelvis CT, dated March 30, 2022 FINDINGS: Cardiovascular: There is mild calcification of the aortic arch, without evidence of aortic aneurysm. Satisfactory opacification of the pulmonary arteries to the segmental level. No evidence of pulmonary embolism. There is mild cardiomegaly with marked severity coronary artery calcification. No pericardial effusion. Mediastinum/Nodes: No enlarged mediastinal, hilar, or axillary lymph nodes. Thyroid gland, trachea, and esophagus demonstrate no significant findings. Lungs/Pleura: Mild, patchy infiltrate is seen within the posteromedial aspect of the left lower lobe. Mild, posterior bibasilar atelectatic changes are also seen. There are small bilateral pleural effusions. No pneumothorax is identified. Upper Abdomen: Multiple surgical clips are seen within the gallbladder fossa. A stable, benign 1.5 cm x 1.3 cm x 1.5 cm low-attenuation (approximately 14.6 Hounsfield units) left adrenal mass is noted. This stability is documented as far back as October 12, 2009. Musculoskeletal: A chronic  compression fracture deformity is seen at the levels of T10 and T12. Multilevel degenerative changes are noted throughout the thoracic spine. Review of the MIP images confirms the above findings. IMPRESSION: 1. No evidence of pulmonary embolism. 2. Mild, patchy left lower lobe infiltrate with mild posterior bibasilar atelectasis. 3. Small bilateral pleural effusions. 4. Stable left adrenal adenoma, as described above. 5. Chronic compression fracture deformities of T10 and T12. 6. Evidence of prior cholecystectomy. 7. Aortic atherosclerosis. Electronically Signed   By: Aram Candela M.D.   On: 04/14/2022 18:34   CT Head Wo Contrast  Result Date: 04/14/2022 CLINICAL DATA:  Head trauma EXAM: CT HEAD WITHOUT CONTRAST CT CERVICAL SPINE WITHOUT CONTRAST TECHNIQUE: Multidetector CT imaging of the head and cervical spine was performed following the standard protocol without intravenous contrast. Multiplanar CT image reconstructions of the cervical spine were also generated. RADIATION DOSE REDUCTION: This exam was performed according to the departmental dose-optimization program which includes automated exposure control, adjustment of the mA and/or kV according to patient size and/or use of iterative reconstruction technique. COMPARISON:  MRI brain 11/14/2017, CT brain 11/14/2017 FINDINGS: CT HEAD FINDINGS Brain: No acute territorial infarction, hemorrhage or intracranial mass. Chronic encephalomalacia in the right frontal and temporal lobes as well as the right insula and basal ganglia. Wallerian degeneration of the right cerebral peduncle and brainstem. Ex vacuo dilatation of right lateral ventricle anteriorly. Atrophy and chronic small vessel ischemic changes of the white matter Vascular: No hyperdense vessels.  Carotid vascular calcification Skull: Normal. Negative for fracture or focal lesion. Sinuses/Orbits: No acute finding. Other: Small right posterior parietal scalp hematoma CT CERVICAL SPINE FINDINGS  Alignment: Mild reversal of cervical lordosis. Facet alignment within normal limits Skull base and vertebrae: No acute fracture. No primary bone lesion or focal pathologic process. Soft tissues and spinal canal: No prevertebral fluid or swelling. No visible canal hematoma. Disc levels: Multilevel degenerative change with moderate disc space narrowing C5-C6 and C6-C7. Facet degenerative changes  at multiple levels. Upper chest: Small left pleural effusion. Other: None IMPRESSION: 1. No CT evidence for acute intracranial abnormality. Atrophy and chronic small vessel ischemic changes of the white matter. Chronic right frontal parietal infarcts. 2. Mild reversal of cervical lordosis with degenerative change. No acute osseous abnormality Electronically Signed   By: Jasmine Pang M.D.   On: 04/14/2022 16:37   CT Cervical Spine Wo Contrast  Result Date: 04/14/2022 CLINICAL DATA:  Head trauma EXAM: CT HEAD WITHOUT CONTRAST CT CERVICAL SPINE WITHOUT CONTRAST TECHNIQUE: Multidetector CT imaging of the head and cervical spine was performed following the standard protocol without intravenous contrast. Multiplanar CT image reconstructions of the cervical spine were also generated. RADIATION DOSE REDUCTION: This exam was performed according to the departmental dose-optimization program which includes automated exposure control, adjustment of the mA and/or kV according to patient size and/or use of iterative reconstruction technique. COMPARISON:  MRI brain 11/14/2017, CT brain 11/14/2017 FINDINGS: CT HEAD FINDINGS Brain: No acute territorial infarction, hemorrhage or intracranial mass. Chronic encephalomalacia in the right frontal and temporal lobes as well as the right insula and basal ganglia. Wallerian degeneration of the right cerebral peduncle and brainstem. Ex vacuo dilatation of right lateral ventricle anteriorly. Atrophy and chronic small vessel ischemic changes of the white matter Vascular: No hyperdense vessels.   Carotid vascular calcification Skull: Normal. Negative for fracture or focal lesion. Sinuses/Orbits: No acute finding. Other: Small right posterior parietal scalp hematoma CT CERVICAL SPINE FINDINGS Alignment: Mild reversal of cervical lordosis. Facet alignment within normal limits Skull base and vertebrae: No acute fracture. No primary bone lesion or focal pathologic process. Soft tissues and spinal canal: No prevertebral fluid or swelling. No visible canal hematoma. Disc levels: Multilevel degenerative change with moderate disc space narrowing C5-C6 and C6-C7. Facet degenerative changes at multiple levels. Upper chest: Small left pleural effusion. Other: None IMPRESSION: 1. No CT evidence for acute intracranial abnormality. Atrophy and chronic small vessel ischemic changes of the white matter. Chronic right frontal parietal infarcts. 2. Mild reversal of cervical lordosis with degenerative change. No acute osseous abnormality Electronically Signed   By: Jasmine Pang M.D.   On: 04/14/2022 16:37   CT Lumbar Spine Wo Contrast  Result Date: 04/14/2022 CLINICAL DATA:  Back trauma, no prior imaging (Age >= 16y) EXAM: CT LUMBAR SPINE WITHOUT CONTRAST TECHNIQUE: Multidetector CT imaging of the lumbar spine was performed without intravenous contrast administration. Multiplanar CT image reconstructions were also generated. RADIATION DOSE REDUCTION: This exam was performed according to the departmental dose-optimization program which includes automated exposure control, adjustment of the mA and/or kV according to patient size and/or use of iterative reconstruction technique. COMPARISON:  None Available. FINDINGS: Alignment: Normal. Vertebrae: No evidence of acute fracture.  Osteopenia. Paraspinal and other soft tissues: Aorta bi-iliac atherosclerosis. Cholecystectomy. Disc levels: Lower lumbar degenerative disc disease. Lower lumbar facet arthropathy. IMPRESSION: 1. No evidence of acute fracture or traumatic  malalignment. 2. Lower lumbar degenerative disc disease and facet arthropathy. Electronically Signed   By: Feliberto Harts M.D.   On: 04/14/2022 16:37   DG Chest 2 View  Result Date: 04/14/2022 CLINICAL DATA:  Shortness of breath. EXAM: CHEST - 2 VIEW COMPARISON:  March 30, 2022. FINDINGS: Mild cardiomegaly is noted. Minimal bibasilar subsegmental atelectasis or edema is noted with small pleural effusions. Bony thorax is unremarkable. IMPRESSION: Minimal bibasilar subsegmental atelectasis or edema is noted with small pleural effusions. Electronically Signed   By: Lupita Raider M.D.   On: 04/14/2022 16:28  Scheduled Meds:  apixaban  5 mg Oral Daily   escitalopram  10 mg Oral Daily   influenza vaccine adjuvanted  0.5 mL Intramuscular Tomorrow-1000   insulin aspart  0-15 Units Subcutaneous TID WC   insulin aspart  0-5 Units Subcutaneous QHS   levothyroxine  25 mcg Oral Daily   metoprolol succinate  50 mg Oral Q lunch   nicotine  21 mg Transdermal Daily   pneumococcal 20-valent conjugate vaccine  0.5 mL Intramuscular Tomorrow-1000   polyethylene glycol  17 g Oral Daily   senna-docusate  1 tablet Oral QHS   traZODone  100 mg Oral QHS   Continuous Infusions:  azithromycin 500 mg (04/15/22 1750)   cefTRIAXone (ROCEPHIN)  IV Stopped (04/14/22 2021)     LOS: 1 day   Shon Hale M.D on 04/15/2022 at 6:07 PM  Go to [email or web address] - for contact info  [facility] - Office  [phone]  If 7PM-7AM, please contact night-coverage [email or web address] 04/15/2022, 6:07 PM

## 2022-04-15 NOTE — Progress Notes (Signed)
*  PRELIMINARY RESULTS* Echocardiogram 2D Echocardiogram has been performed.  Megan [name] 04/15/2022, 10:06 AM

## 2022-04-16 DIAGNOSIS — G9341 Metabolic encephalopathy: Secondary | ICD-10-CM | POA: Diagnosis not present

## 2022-04-16 LAB — GLUCOSE, CAPILLARY
Glucose-Capillary: 68 mg/dL — ABNORMAL LOW (ref 70–99)
Glucose-Capillary: 87 mg/dL (ref 70–99)

## 2022-04-16 LAB — RENAL FUNCTION PANEL
Albumin: 2.7 g/dL — ABNORMAL LOW (ref 3.5–5.0)
Anion gap: 8 (ref 5–15)
BUN: 8 mg/dL (ref 8–23)
CO2: 26 mmol/L (ref 22–32)
Calcium: 7.9 mg/dL — ABNORMAL LOW (ref 8.9–10.3)
Chloride: 102 mmol/L (ref 98–111)
Creatinine, Ser: 1.41 mg/dL — ABNORMAL HIGH (ref 0.44–1.00)
GFR, Estimated: 40 mL/min — ABNORMAL LOW (ref >60)
Glucose, Bld: 76 mg/dL (ref 70–99)
Phosphorus: 2.2 mg/dL — ABNORMAL LOW (ref 2.5–4.6)
Potassium: 3.7 mmol/L (ref 3.5–5.1)
Sodium: 136 mmol/L (ref 135–145)

## 2022-04-16 LAB — MAGNESIUM: Magnesium: 1.9 mg/dL (ref 1.7–2.4)

## 2022-04-16 MED ORDER — CEPHALEXIN 500 MG PO CAPS: 500.0000 mg | ORAL_CAPSULE | Freq: Three times a day (TID) | ORAL | 0 refills | Status: AC

## 2022-04-16 MED ORDER — CEFDINIR 300 MG PO CAPS
300.0000 mg | ORAL_CAPSULE | Freq: Once | ORAL | Status: AC
  Administered 2022-04-16: 300 mg via ORAL
  Filled 2022-04-16: qty 1

## 2022-04-16 MED ORDER — TRAZODONE HCL 100 MG PO TABS: 100.0000 mg | ORAL_TABLET | Freq: Every day | ORAL | 1 refills | Status: DC

## 2022-04-16 MED ORDER — SENNOSIDES-DOCUSATE SODIUM 8.6-50 MG PO TABS: 2.0000 | ORAL_TABLET | Freq: Every day | ORAL | 2 refills | Status: AC

## 2022-04-16 MED ORDER — POLYETHYLENE GLYCOL 3350 17 G PO PACK: 17.0000 g | PACK | Freq: Two times a day (BID) | ORAL | 3 refills | Status: DC

## 2022-04-16 MED ORDER — CEPHALEXIN 250 MG PO CAPS: 250.0000 mg | ORAL_CAPSULE | Freq: Three times a day (TID) | ORAL | 0 refills | Status: AC

## 2022-04-16 MED ORDER — AZITHROMYCIN 500 MG PO TABS: 500.0000 mg | ORAL_TABLET | Freq: Every day | ORAL | 0 refills | Status: AC

## 2022-04-16 NOTE — Discharge Summary (Signed)
Megan [name], is a 71 y.o. female  DOB 1950-10-27  MRN [id number].  Admission date:  04/14/2022  Admitting Physician  Rolla Plate, DO  Discharge Date:  04/16/2022   Primary MD  Lemmie Evens, MD  Recommendations for primary care physician for things to follow:   1)Please take medications including Cephalexin and Azithromycin as prescribed 2) follow-up primary care physician within a week for repeat BMP and CBC blood [name] 3) please take Klonopin/clonazepam and oxycodone only as prescribed--- please do not take any extras  Admission Diagnosis  Acute respiratory failure with hypoxia ([place]) [J96.01] Fall, initial encounter [W19.XXXA] Hypotension, unspecified hypotension type [Q65.7] Acute metabolic encephalopathy [Q46.96] Benzodiazepine intoxication ([place]) [F13.129] Pneumonia due to infectious organism, unspecified laterality, unspecified part of lung [J18.9]   Discharge Diagnosis  Acute respiratory failure with hypoxia ([place]) [J96.01] Fall, initial encounter [W19.XXXA] Hypotension, unspecified hypotension type [E95.2] Acute metabolic encephalopathy [W41.32] Benzodiazepine intoxication ([place]) [F13.129] Pneumonia due to infectious organism, unspecified laterality, unspecified part of lung [J18.9]    Principal Problem:   Acute metabolic encephalopathy Active Problems:   DM type 2 (diabetes mellitus, type 2) (HCC)   Benign essential HTN   Anxiety and depression   Constipation   Atrial fibrillation, chronic (HCC)   CAP (community acquired pneumonia)   Hypothyroidism   Peripheral edema   Fall at home, initial encounter   Benzodiazepine overdose   Acute respiratory failure with hypoxia ([place])      Past Medical History:  Diagnosis Date   Agoraphobia with panic disorder    Anxiety    Arterial hypotension 12/20/2017   Arthritis    Chronic pain 03/08/15   Hydrocodone-APAP 10/325 mg, # 120  q month   Colon polyps    Complication of anesthesia    Depression    DM (diabetes mellitus) ([place])    GERD (gastroesophageal reflux disease)    HTN (hypertension)    Hypothyroidism    Obesity    PAF (paroxysmal atrial fibrillation) (HCC)    PONV (postoperative nausea and vomiting)    Stroke ([place]) 08/20/2014   some memory loss.    Past Surgical History:  Procedure Laterality Date   ABDOMINAL HYSTERECTOMY  15 yrs ago   with bso at [facility]  2004   normal   CATARACT EXTRACTION W/PHACO Right 11/10/2019   Procedure: CATARACT EXTRACTION PHACO AND INTRAOCULAR LENS PLACEMENT RIGHT EYE;  Surgeon: Baruch Goldmann, MD;  Location: [facility];  Service: Ophthalmology;  Laterality: Right;  CDE: 46.63   CATARACT EXTRACTION W/PHACO Left 11/24/2019   Procedure: CATARACT EXTRACTION PHACO AND INTRAOCULAR LENS PLACEMENT (IOC) CDE: 7.95;  Surgeon: Baruch Goldmann, MD;  Location: [facility];  Service: Ophthalmology;  Laterality: Left;   CHOLECYSTECTOMY  25 yrs ago   [facility]   COLONOSCOPY  05/04/11   left-sided colonic diverticulosis   COLONOSCOPY N/A 01/27/2016   diverticulosis in sigmoid colon and descending colon, active bleeding Dieulafoy lesion s/p clips X 3, next colonoscopy in 5 years due to family history of colon cancer  ESOPHAGOGASTRODUODENOSCOPY  05/04/2011   Abnormal distal esophagus, biopsies consistent with inflammation due to acid reflux. No evidence of Barrett's. No H. pylori   ESOPHAGOGASTRODUODENOSCOPY N/A 01/22/2016   erosive gastropathy, non-bleeding gastric ulcers and 1 oozing gastric ulcer with adherent clot s/p heater probe. Negative H.pylori serology.    ESOPHAGOGASTRODUODENOSCOPY (EGD) WITH PROPOFOL N/A 04/27/2016   Dr. Gala Romney: healed ulcers. normal esophagus   RADIOLOGY WITH ANESTHESIA N/A 08/20/2015   Procedure: RADIOLOGY WITH ANESTHESIA;  Surgeon: Luanne Bras, MD;  Location: [facility];  Service: Radiology;  Laterality: N/A;      HPI  from the history and physical  done on the day of admission:    HPI: Megan [name] is a 71 y.o. female with medical history significant of history of anxiety and depression, hypertension, diabetes mellitus type 2, GERD, hypothyroidism, paroxysmal atrial fibrillation, stroke, and more presents the ED with a chief complaint of somnolence and lethargy.  Husband is not at bedside at the time of my exam.  He had reported to the ED provider that patient was brought in because she was somnolent and lethargic throughout the day.  Apparently patient has not been sleeping well at night.  She has Klonopin prescribed 1 mg twice daily as needed anxiety.  She took 2 mg at 9 PM on the night prior to admission.  She reports she was still unable to sleep so she took another 2 mg at 1 PM.  She was then somnolent lethargic throughout the day and had a fall.  She is apparently been having falls at home that are mechanical in nature.  This 1 she does not remember well enough to give me good history.  Apparently the husband had reported to the ER doctor that she fell asleep while ambulatory and that caused her fall.  Patient is foggy on all of those details.  She reports that she came in because of her feet swelling.  She reports that the swelling started on Monday and has been worse since it started.  She has a prescription for 40-80 mg of Lasix.  Patient reports that months ago her doctor told her to break the pill in half and only take 20 mg.  That is what she has been doing.  She denies any dyspnea, orthopnea, chest pain.  She feels like her legs are c-Met, like she cannot lift them up.  She ambulates with a walker at baseline and could not pick her feet up today to ambulate per her report.  On review of systems patient reports that she has been constipated.  Its been 6 days since she has had a bowel movement.  She is taking MiraLAX 3 days ago and not had any success.  Patient denies abdominal pain.  Patient also denies cough and fever.  No other  complaints at this time.     Review of Systems: As mentioned in the history of present illness. All other systems reviewed and are negative.   Hospital Course:   Brief Narrative:  71 y.o. female with medical history significant of history of anxiety and depression, hypertension, diabetes mellitus type 2, GERD, hypothyroidism, paroxysmal atrial fibrillation and prior history of stroke admitted on 04/14/2022 with acute hypoxic respiratory failure and acute metabolic encephalopathy presumably due to benzo overuse     -Assessment and Plan: 1) Acute Metabolic Encephalopathy/Psychosis - UDS with opiates and benzos -UA with moderate leukocyte esterase rare bacteria and 6-10 WBC -Husband reports that patient took more than prescribed -Pt had Visual hallucinations ,  confusion and disorientation  -CT head without acute findings -CTA chest with possible pneumonia---  -Treated with IV Rocephin and azithromycin for possible pneumonia UTI -Urine cultures pending -We will discharge on Keflex and azithromycin -Clinically patient has improved significantly -Mentation and behaviors has improved significantly -Husband reports chronic insomnia and some mood disorder--okay to discharge on trazodone nightly   2)Acute respiratory failure with hypoxia (HCC) - -In the setting of benzo and opiate use and finding of possible pneumonia on CTA chest -Hypoxia has resolved -Respiratory status improved significantly -Ambulated to the bathroom back-and-forth without hypoxia or dyspnea on exertion   3) unintentional/accidental benzodiazepine and opiate overdose - No intent of self-harm - Patient took 2 mg of Klonopin at 9 PM and when she could not sleep at 1 AM she took another 2 mg of Klonopin Patient also took opiates- - Patient did have a fall as result of this -Mentation and level of alertness back to baseline according to patient's husband -Patient reminded to avoid excessive benzo and opiate use   4)Fall  at home, initial encounter - Patient has reportedly had several mechanical falls in the past few days.  She does not remember falling today but it was reported in the ER that she did fall - CT head and C-spine negative for acute changes -Suspect related to benzo/opiate use -PT eval appreciated recommends home health PT   Peripheral edema - 2+ pitting edema BNP 539 which is lower than prior baseline of 586 from 04/02/2022 -Chest x-ray and CTA chest without CHF type findings - Echo preserved EF of 70 to 75%, actually hyperdynamic -PTA patient used as needed Lasix   Hypothyroidism - TSH 0.89 - Continue Synthroid   Possible pneumonia- -CTA chest with possible left-sided pneumonia -Management as above #1   Atrial fibrillation, chronic (HCC) - Continue Eliquis and metoprolol   Constipation - Reports she has not had a bowel movement in 6 days -Tried stool softeners at home without relief -Hold BM with lactulose -Appears to have chronic constipation okay to discharge on MiraLAX and Senokot -Follow-up with PCP for further evaluation, patient is advised to update her colonoscopy  Anxiety and depression --Judicious with benzos - Continue Lexapro -Discharged on trazodone   Benign essential HTN - Continue metoprolol   DM type 2 (diabetes mellitus, type 2) (HCC) -A1c 6.8 reflecting fair diabetic control PTA -Resume PTA regimen   Disposition: The patient is from: Home              Anticipated d/c is to: Home            Code Status :  -  Code Status: Full Code    Family Communication:    Discussed with husband at bedside  Discharge Condition: Stable  Follow UP   Follow-up Information     Lemmie Evens, MD. Schedule an appointment as soon as possible for a visit in 1 week(s).   Specialty: Family Medicine Why: Repeat CBC and BMP blood [name] within a week advised Contact information: [place] [address] [phone]         Fay Records, MD .    Specialty: Cardiology Contact information: [address] [address] [phone]                 Diet and Activity recommendation:  As advised  Discharge Instructions   Discharge Instructions     Call MD for:  difficulty breathing, headache or visual disturbances   Complete by:  As directed    Call MD for:  persistant dizziness or light-headedness   Complete by: As directed    Call MD for:  persistant nausea and vomiting   Complete by: As directed    Call MD for:  temperature >100.4   Complete by: As directed    Diet - low sodium heart healthy   Complete by: As directed    Diet Carb Modified   Complete by: As directed    Discharge instructions   Complete by: As directed    1)Please take medications including Cephalexin and Azithromycin as prescribed 2) follow-up primary care physician within a week for repeat BMP and CBC blood [name] 3) please take Klonopin/clonazepam and oxycodone only as prescribed--- please do not take any extras   Increase activity slowly   Complete by: As directed        Discharge Medications     Allergies as of 04/16/2022       Reactions   Naproxen Other (See Comments)   Causes stomach burning        Medication List     TAKE these medications    acetaminophen 325 MG tablet Commonly known as: Tylenol Take 2 tablets (650 mg total) by mouth every 6 (six) hours as needed for mild pain, moderate pain or headache.   apixaban 5 MG Tabs tablet Commonly known as: Eliquis Take 1 tablet (5 mg total) by mouth 2 (two) times daily. Restart on 7/14 What changed:  when to take this additional instructions   azithromycin 500 MG tablet Commonly known as: ZITHROMAX Take 1 tablet (500 mg total) by mouth daily for 3 days.   B-12 PO Take by mouth daily.   cephALEXin 500 MG capsule Commonly known as: Keflex Take 1 capsule (500 mg total) by mouth 3 (three) times daily for 5 days.   cephALEXin 250 MG capsule Commonly known  as: KEFLEX Take 1 capsule (250 mg total) by mouth 3 (three) times daily for 5 days.   clonazePAM 1 MG tablet Commonly known as: KLONOPIN Take 1 mg by mouth 2 (two) times daily as needed for anxiety.   escitalopram 10 MG tablet Commonly known as: LEXAPRO Take 1 tablet (10 mg total) by mouth daily. What changed: how much to take   fluticasone 50 MCG/ACT nasal spray Commonly known as: FLONASE Place 2 sprays into both nostrils daily.   furosemide 40 MG tablet Commonly known as: LASIX Take 40-80 mg by mouth daily as needed.   Jardiance 10 MG Tabs tablet Generic drug: empagliflozin Take 10 mg by mouth every morning.   levothyroxine 25 MCG tablet Commonly known as: SYNTHROID Take 25 mcg by mouth daily.   metFORMIN 500 MG tablet Commonly known as: GLUCOPHAGE Take 1,000 mg by mouth 2 (two) times daily.   metoprolol succinate 50 MG 24 hr tablet Commonly known as: TOPROL-XL Take 1 tablet (50 mg total) by mouth daily with lunch.   ondansetron 4 MG tablet Commonly known as: ZOFRAN 4 mg every 8 (eight) hours as needed for vomiting or nausea.   oxyCODONE-acetaminophen 5-325 MG tablet Commonly known as: PERCOCET/ROXICET Take 1 tablet by mouth every 6 (six) hours as needed for moderate pain.   polyethylene glycol 17 g packet Commonly known as: MiraLax Take 17 g by mouth 2 (two) times daily.   predniSONE 5 MG tablet Commonly known as: DELTASONE Take 5 mg by mouth daily.   senna-docusate 8.6-50 MG tablet Commonly known as: Senokot-S Take 2 tablets by mouth at bedtime.  traZODone 100 MG tablet Commonly known as: DESYREL Take 1 tablet (100 mg total) by mouth at bedtime. For sleep and Mood   VITAMIN D-3 PO Take 2,000 Units by mouth daily.       Major procedures and Radiology Reports - PLEASE review detailed and final reports for all details, in brief -   ECHOCARDIOGRAM COMPLETE  Result Date: 04/15/2022    ECHOCARDIOGRAM REPORT   Patient Name:   Megan [name]  Date of Exam: 04/15/2022 Medical Rec #:  [id number]           Height:       62.0 in Accession #:    [id number]          Weight:       189.2 lb Date of Birth:  [birth year]           BSA:          1.867 m Patient Age:    38 years            BP:           142/73 mmHg Patient Gender: F                   HR:           74 bpm. Exam Location:  [facility] Procedure: 2D Echo, Cardiac Doppler and Color Doppler Indications:    Edema [782.3.ICD-9-CM]  History:        Patient has prior history of Echocardiogram examinations, most                 recent 10/10/2017. Stroke; Risk Factors:Hypertension, Diabetes                 and Former Smoker. GERD. Heart Failure.  Sonographer:    Alvino Chapel RCS Referring Phys: [id number] ASIA B [place]  1. Left ventricular ejection fraction, by estimation, is 70 to 75%. The left ventricle has hyperdynamic function. The left ventricle has no regional wall motion abnormalities. Left ventricular diastolic parameters are indeterminate.  2. Right ventricular systolic function is normal. The right ventricular size is normal. There is moderately elevated pulmonary artery systolic pressure.  3. Left atrial size was mildly dilated.  4. The mitral valve is grossly normal. Trivial mitral valve regurgitation. No evidence of mitral stenosis.  5. The aortic valve is tricuspid. There is mild calcification of the aortic valve. There is mild thickening of the aortic valve. Aortic valve regurgitation is not visualized. Aortic valve sclerosis/calcification is present, without any evidence of aortic stenosis.  6. The inferior vena cava is dilated in size with >50% respiratory variability, suggesting right atrial pressure of 8 mmHg. Comparison(s): Compared to prior TTE in 6295, the LV systolic function is now hyperdynamic. Otherwise, there is no significant change. FINDINGS  Left Ventricle: Left ventricular ejection fraction, by estimation, is 70 to 75%. The left ventricle has hyperdynamic function. The  left ventricle has no regional wall motion abnormalities. The left ventricular internal cavity size was normal in size. There is no left ventricular hypertrophy. Left ventricular diastolic parameters are indeterminate. Right Ventricle: The right ventricular size is normal. No increase in right ventricular wall thickness. Right ventricular systolic function is normal. There is moderately elevated pulmonary artery systolic pressure. The tricuspid regurgitant velocity is 3.27 m/s, and with an assumed right atrial pressure of 8 mmHg, the estimated right ventricular systolic pressure is 28.4 mmHg. Left Atrium: Left atrial size was mildly dilated. Right Atrium: Right atrial size  was normal in size. Pericardium: There is no evidence of pericardial effusion. Mitral Valve: The mitral valve is grossly normal. There is mild thickening of the mitral valve leaflet(s). There is mild calcification of the mitral valve leaflet(s). Mild mitral annular calcification. Trivial mitral valve regurgitation. No evidence of mitral valve stenosis. Tricuspid Valve: The tricuspid valve is normal in structure. Tricuspid valve regurgitation is mild. Aortic Valve: The aortic valve is tricuspid. There is mild calcification of the aortic valve. There is mild thickening of the aortic valve. Aortic valve regurgitation is not visualized. Aortic valve sclerosis/calcification is present, without any evidence of aortic stenosis. Pulmonic Valve: The pulmonic valve was normal in structure. Pulmonic valve regurgitation is trivial. Aorta: The aortic root and ascending aorta are structurally normal, with no evidence of dilitation. Venous: The inferior vena cava is dilated in size with greater than 50% respiratory variability, suggesting right atrial pressure of 8 mmHg. IAS/Shunts: The atrial septum is grossly normal.  LEFT VENTRICLE PLAX 2D LVIDd:         4.40 cm   Diastology LVIDs:         3.10 cm   LV e' medial:    6.42 cm/s LV PW:         1.00 cm   LV E/e'  medial:  21.8 LV IVS:        1.00 cm   LV e' lateral:   7.83 cm/s LVOT diam:     1.70 cm   LV E/e' lateral: 17.9 LV SV:         59 LV SV Index:   32 LVOT Area:     2.27 cm  RIGHT VENTRICLE RV S prime:     12.70 cm/s TAPSE (M-mode): 2.1 cm LEFT ATRIUM             Index        RIGHT ATRIUM           Index LA diam:        4.20 cm 2.25 cm/m   RA Area:     15.50 cm LA Vol (A2C):   68.5 ml 36.66 ml/m  RA Volume:   37.50 ml  20.09 ml/m LA Vol (A4C):   74.0 ml 39.64 ml/m LA Biplane Vol: 78.4 ml 41.99 ml/m  AORTIC VALVE LVOT Vmax:   114.00 cm/s LVOT Vmean:  85.900 cm/s LVOT VTI:    0.262 m  AORTA Ao Root diam: 3.20 cm MITRAL VALVE                TRICUSPID VALVE MV Area (PHT): 5.84 cm     TR Peak grad:   42.8 mmHg MV Decel Time: 130 msec     TR Vmax:        327.00 cm/s MV E velocity: 140.00 cm/s MV A velocity: 42.70 cm/s   SHUNTS MV E/A ratio:  3.28         Systemic VTI:  0.26 m                             Systemic Diam: 1.70 cm Gwyndolyn Kaufman MD Electronically signed by Gwyndolyn Kaufman MD Signature Date/Time: 04/15/2022/12:51:24 PM    Final    CT Angio Chest PE W/Cm &/Or Wo Cm  Result Date: 04/14/2022 CLINICAL DATA:  Sore throat with headache and generalized weakness. EXAM: CT ANGIOGRAPHY CHEST WITH CONTRAST TECHNIQUE: Multidetector CT imaging of the chest was performed using the standard protocol during  bolus administration of intravenous contrast. Multiplanar CT image reconstructions and MIPs were obtained to evaluate the vascular anatomy. RADIATION DOSE REDUCTION: This exam was performed according to the departmental dose-optimization program which includes automated exposure control, adjustment of the mA and/or kV according to patient size and/or use of iterative reconstruction technique. CONTRAST:  88m OMNIPAQUE IOHEXOL 350 MG/ML SOLN COMPARISON:  Abdomen and pelvis CT, dated March 30, 2022 FINDINGS: Cardiovascular: There is mild calcification of the aortic arch, without evidence of aortic aneurysm.  Satisfactory opacification of the pulmonary arteries to the segmental level. No evidence of pulmonary embolism. There is mild cardiomegaly with marked severity coronary artery calcification. No pericardial effusion. Mediastinum/Nodes: No enlarged mediastinal, hilar, or axillary lymph nodes. Thyroid gland, trachea, and esophagus demonstrate no significant findings. Lungs/Pleura: Mild, patchy infiltrate is seen within the posteromedial aspect of the left lower lobe. Mild, posterior bibasilar atelectatic changes are also seen. There are small bilateral pleural effusions. No pneumothorax is identified. Upper Abdomen: Multiple surgical clips are seen within the gallbladder fossa. A stable, benign 1.5 cm x 1.3 cm x 1.5 cm low-attenuation (approximately 14.6 Hounsfield units) left adrenal mass is noted. This stability is documented as far back as October 12, 2009. Musculoskeletal: A chronic compression fracture deformity is seen at the levels of T10 and T12. Multilevel degenerative changes are noted throughout the thoracic spine. Review of the MIP images confirms the above findings. IMPRESSION: 1. No evidence of pulmonary embolism. 2. Mild, patchy left lower lobe infiltrate with mild posterior bibasilar atelectasis. 3. Small bilateral pleural effusions. 4. Stable left adrenal adenoma, as described above. 5. Chronic compression fracture deformities of T10 and T12. 6. Evidence of prior cholecystectomy. 7. Aortic atherosclerosis. Electronically Signed   By: TVirgina NorfolkM.D.   On: 04/14/2022 18:34   CT Head Wo Contrast  Result Date: 04/14/2022 CLINICAL DATA:  Head trauma EXAM: CT HEAD WITHOUT CONTRAST CT CERVICAL SPINE WITHOUT CONTRAST TECHNIQUE: Multidetector CT imaging of the head and cervical spine was performed following the standard protocol without intravenous contrast. Multiplanar CT image reconstructions of the cervical spine were also generated. RADIATION DOSE REDUCTION: This exam was performed according to  the departmental dose-optimization program which includes automated exposure control, adjustment of the mA and/or kV according to patient size and/or use of iterative reconstruction technique. COMPARISON:  MRI brain 11/14/2017, CT brain 11/14/2017 FINDINGS: CT HEAD FINDINGS Brain: No acute territorial infarction, hemorrhage or intracranial mass. Chronic encephalomalacia in the right frontal and temporal lobes as well as the right insula and basal ganglia. Wallerian degeneration of the right cerebral peduncle and brainstem. Ex vacuo dilatation of right lateral ventricle anteriorly. Atrophy and chronic small vessel ischemic changes of the white matter Vascular: No hyperdense vessels.  Carotid vascular calcification Skull: Normal. Negative for fracture or focal lesion. Sinuses/Orbits: No acute finding. Other: Small right posterior parietal scalp hematoma CT CERVICAL SPINE FINDINGS Alignment: Mild reversal of cervical lordosis. Facet alignment within normal limits Skull base and vertebrae: No acute fracture. No primary bone lesion or focal pathologic process. Soft tissues and spinal canal: No prevertebral fluid or swelling. No visible canal hematoma. Disc levels: Multilevel degenerative change with moderate disc space narrowing C5-C6 and C6-C7. Facet degenerative changes at multiple levels. Upper chest: Small left pleural effusion. Other: None IMPRESSION: 1. No CT evidence for acute intracranial abnormality. Atrophy and chronic small vessel ischemic changes of the white matter. Chronic right frontal parietal infarcts. 2. Mild reversal of cervical lordosis with degenerative change. No acute osseous abnormality Electronically Signed  By: Donavan Foil M.D.   On: 04/14/2022 16:37   CT Cervical Spine Wo Contrast  Result Date: 04/14/2022 CLINICAL DATA:  Head trauma EXAM: CT HEAD WITHOUT CONTRAST CT CERVICAL SPINE WITHOUT CONTRAST TECHNIQUE: Multidetector CT imaging of the head and cervical spine was performed following  the standard protocol without intravenous contrast. Multiplanar CT image reconstructions of the cervical spine were also generated. RADIATION DOSE REDUCTION: This exam was performed according to the departmental dose-optimization program which includes automated exposure control, adjustment of the mA and/or kV according to patient size and/or use of iterative reconstruction technique. COMPARISON:  MRI brain 11/14/2017, CT brain 11/14/2017 FINDINGS: CT HEAD FINDINGS Brain: No acute territorial infarction, hemorrhage or intracranial mass. Chronic encephalomalacia in the right frontal and temporal lobes as well as the right insula and basal ganglia. Wallerian degeneration of the right cerebral peduncle and brainstem. Ex vacuo dilatation of right lateral ventricle anteriorly. Atrophy and chronic small vessel ischemic changes of the white matter Vascular: No hyperdense vessels.  Carotid vascular calcification Skull: Normal. Negative for fracture or focal lesion. Sinuses/Orbits: No acute finding. Other: Small right posterior parietal scalp hematoma CT CERVICAL SPINE FINDINGS Alignment: Mild reversal of cervical lordosis. Facet alignment within normal limits Skull base and vertebrae: No acute fracture. No primary bone lesion or focal pathologic process. Soft tissues and spinal canal: No prevertebral fluid or swelling. No visible canal hematoma. Disc levels: Multilevel degenerative change with moderate disc space narrowing C5-C6 and C6-C7. Facet degenerative changes at multiple levels. Upper chest: Small left pleural effusion. Other: None IMPRESSION: 1. No CT evidence for acute intracranial abnormality. Atrophy and chronic small vessel ischemic changes of the white matter. Chronic right frontal parietal infarcts. 2. Mild reversal of cervical lordosis with degenerative change. No acute osseous abnormality Electronically Signed   By: Donavan Foil M.D.   On: 04/14/2022 16:37   CT Lumbar Spine Wo Contrast  Result Date:  04/14/2022 CLINICAL DATA:  Back trauma, no prior imaging (Age >= 16y) EXAM: CT LUMBAR SPINE WITHOUT CONTRAST TECHNIQUE: Multidetector CT imaging of the lumbar spine was performed without intravenous contrast administration. Multiplanar CT image reconstructions were also generated. RADIATION DOSE REDUCTION: This exam was performed according to the departmental dose-optimization program which includes automated exposure control, adjustment of the mA and/or kV according to patient size and/or use of iterative reconstruction technique. COMPARISON:  None Available. FINDINGS: Alignment: Normal. Vertebrae: No evidence of acute fracture.  Osteopenia. Paraspinal and other soft tissues: Aorta bi-iliac atherosclerosis. Cholecystectomy. Disc levels: Lower lumbar degenerative disc disease. Lower lumbar facet arthropathy. IMPRESSION: 1. No evidence of acute fracture or traumatic malalignment. 2. Lower lumbar degenerative disc disease and facet arthropathy. Electronically Signed   By: Margaretha Sheffield M.D.   On: 04/14/2022 16:37   DG Chest 2 View  Result Date: 04/14/2022 CLINICAL DATA:  Shortness of breath. EXAM: CHEST - 2 VIEW COMPARISON:  March 30, 2022. FINDINGS: Mild cardiomegaly is noted. Minimal bibasilar subsegmental atelectasis or edema is noted with small pleural effusions. Bony thorax is unremarkable. IMPRESSION: Minimal bibasilar subsegmental atelectasis or edema is noted with small pleural effusions. Electronically Signed   By: Marijo Conception M.D.   On: 04/14/2022 16:28   DG Foot 2 Views Right  Result Date: 03/31/2022 CLINICAL DATA:  Lateral right foot pain.  No prior surgery. EXAM: RIGHT FOOT - 2 VIEW COMPARISON:  Right ankle radiographs 03/30/2022, right foot radiographs 10/19/2021; CT right foot 08/25/2021 FINDINGS: There is diffuse decreased bone mineralization. Minimal hallux valgus with mild great  toe metatarsophalangeal joint space narrowing. Mild joint space narrowing of the interphalangeal joints  diffusely. Moderate tarsometatarsal joint space narrowing. Small plantar calcaneal heel spur. There is again mild irregularity and possible cortical step-off within the distal anterior aspect of the anterior process of the calcaneus on frontal view, not significantly changed from 10/19/2021 and related to the acute fracture seen on prior CT 08/25/2021. IMPRESSION: 1. No significant change compared to 11/09/2021. 2. Mild hallux valgus and mild great toe metatarsophalangeal joint osteoarthritis. 3. Moderate tarsometatarsal osteoarthritis. 4. Unchanged chronic irregularity at the distal lateral aspect of the anterior process of the calcaneus related to the now subacute to chronic fracture better seen on prior 08/25/2021 CT. Electronically Signed   By: Yvonne Kendall M.D.   On: 03/31/2022 16:13   CT ABDOMEN PELVIS WO CONTRAST  Result Date: 03/30/2022 CLINICAL DATA:  Hypotension with abdomen pain EXAM: CT ABDOMEN AND PELVIS WITHOUT CONTRAST TECHNIQUE: Multidetector CT imaging of the abdomen and pelvis was performed following the standard protocol without IV contrast. RADIATION DOSE REDUCTION: This exam was performed according to the departmental dose-optimization program which includes automated exposure control, adjustment of the mA and/or kV according to patient size and/or use of iterative reconstruction technique. COMPARISON:  CT 03/08/2015, lumbar radiograph 04/25/2017, 05/04/2020, CT 05/04/2020 FINDINGS: Lower chest: Lung bases demonstrate linear scarring or atelectasis at the bases. No acute airspace disease. Cardiomegaly. Hepatobiliary: Status post cholecystectomy. No focal hepatic abnormality. Common bile duct measures up to 10 mm. Hazy edematous appearance at the porta hepatis with possible periportal hypodensity though limited without contrast. Pancreas: Atrophic.  No inflammation Spleen: Normal in size without focal abnormality. Adrenals/Urinary Tract: Stable bilateral adrenal gland nodules measuring 14 mm  on the left and 14 mm on the right, consistent with adenoma, no specific imaging follow-up is recommended. Nonspecific perinephric fat stranding. Probable cortical atrophy of kidneys. No hydronephrosis. Redundant catheter tubing in the bladder. The bladder is thick walled with stranding. Stomach/Bowel: The stomach is nonenlarged. Small duodenal diverticulum. There is no dilated small bowel. Negative appendix. Diverticular disease of the left colon. Mild fat stranding at the sigmoid colon, series 2, image 66 without substantial wall thickening. Vascular/Lymphatic: Moderate severe aortic atherosclerosis. No aneurysm. No suspicious lymph nodes Reproductive: Status post hysterectomy. No adnexal masses. Other: Negative for pelvic effusion or free air. Small fat containing periumbilical hernia. Musculoskeletal: Moderate compression fractures T10 and T12, probably chronic. IMPRESSION: 1. No hydronephrosis. Decompressed urinary bladder appears thick walled with perivesical stranding, suspicious for cystitis. Nonspecific moderate perinephric fat stranding 2. Left colon diverticular disease with mild fat stranding adjacent to a few sigmoid colon diverticula suggestive of mild diverticulitis. No significant wall thickening. 3. Status post cholecystectomy with mildly prominent common bile duct likely due to surgical change but correlate with appropriate laboratory values. Electronically Signed   By: Donavan Foil M.D.   On: 03/30/2022 16:47   DG Ankle Complete Right  Result Date: 03/30/2022 CLINICAL DATA:  Right lateral ankle swelling. EXAM: RIGHT ANKLE - COMPLETE 3+ VIEW COMPARISON:  None Available. FINDINGS: There is no evidence of fracture, dislocation, or joint effusion. There is no evidence of arthropathy or other focal bone abnormality. Soft tissues swelling about the right ankle. IMPRESSION: 1. No acute fracture or dislocation identified about the right ankle. 2. Soft tissue swelling about the right ankle.  Electronically Signed   By: Fidela Salisbury M.D.   On: 03/30/2022 14:25   DG Chest Port 1 View  Result Date: 03/30/2022 CLINICAL DATA:  Possible sepsis EXAM: PORTABLE  CHEST 1 VIEW COMPARISON:  06/01/2021 FINDINGS: Mild bilateral interstitial thickening. No focal consolidation. No pleural effusion or pneumothorax. Stable cardiomegaly. No acute osseous abnormality. IMPRESSION: 1. Cardiomegaly with mild pulmonary vascular congestion. Electronically Signed   By: Kathreen Devoid M.D.   On: 03/30/2022 14:24    Micro Results   Recent Results (from the past 240 hour(s))  Resp Panel by RT-PCR (Flu A&B, Covid) Anterior Nasal Swab     Status: None   Collection Time: 04/14/22  4:21 PM   Specimen: Anterior Nasal Swab  Result Value Ref Range Status   SARS Coronavirus 2 by RT PCR NEGATIVE NEGATIVE Final    Comment: (NOTE) SARS-CoV-2 target nucleic acids are NOT DETECTED.  The SARS-CoV-2 RNA is generally detectable in upper respiratory specimens during the acute phase of infection. The lowest concentration of SARS-CoV-2 viral copies this assay can detect is 138 copies/mL. A negative result does not preclude SARS-Cov-2 infection and should not be used as the sole basis for treatment or other patient management decisions. A negative result may occur with  improper specimen collection/handling, submission of specimen other than nasopharyngeal swab, presence of viral mutation(s) within the areas targeted by this assay, and inadequate number of viral copies(<138 copies/mL). A negative result must be combined with clinical observations, patient history, and epidemiological information. The expected result is Negative.  Fact Sheet for Patients:  EntrepreneurPulse.com.au  Fact Sheet for Healthcare Providers:  IncredibleEmployment.be  This [name] is no t yet approved or cleared by the [place] FDA and  has been authorized for detection and/or diagnosis of SARS-CoV-2  by FDA under an Emergency Use Authorization (EUA). This EUA will remain  in effect (meaning this [name] can be used) for the duration of the COVID-19 declaration under Section 564(b)(1) of the Act, 21 U.S.C.section 360bbb-3(b)(1), unless the authorization is terminated  or revoked sooner.       Influenza A by PCR NEGATIVE NEGATIVE Final   Influenza B by PCR NEGATIVE NEGATIVE Final    Comment: (NOTE) The Xpert Xpress SARS-CoV-2/FLU/RSV plus assay is intended as an aid in the diagnosis of influenza from Nasopharyngeal swab specimens and should not be used as a sole basis for treatment. Nasal washings and aspirates are unacceptable for Xpert Xpress SARS-CoV-2/FLU/RSV testing.  Fact Sheet for Patients: EntrepreneurPulse.com.au  Fact Sheet for Healthcare Providers: IncredibleEmployment.be  This [name] is not yet approved or cleared by the [place] FDA and has been authorized for detection and/or diagnosis of SARS-CoV-2 by FDA under an Emergency Use Authorization (EUA). This EUA will remain in effect (meaning this [name] can be used) for the duration of the COVID-19 declaration under Section 564(b)(1) of the Act, 21 U.S.C. section 360bbb-3(b)(1), unless the authorization is terminated or revoked.  Performed at [facility], [address], [place], [place] [address]   Culture, blood (Routine X 2) w Reflex to ID Panel     Status: None (Preliminary result)   Collection Time: 04/14/22  7:28 PM   Specimen: Right Antecubital; Blood  Result Value Ref Range Status   Specimen Description RIGHT ANTECUBITAL  Final   Special Requests   Final    BOTTLES DRAWN AEROBIC ONLY Blood Culture adequate volume   Culture   Final    NO GROWTH 2 DAYS Performed at [facility], [address], [place], [place] [address]    Report Status PENDING  Incomplete  Culture, blood (Routine X 2) w Reflex to ID Panel     Status: None (Preliminary result)   Collection  Time:  04/14/22  7:37 PM   Specimen: BLOOD RIGHT FOREARM  Result Value Ref Range Status   Specimen Description BLOOD RIGHT FOREARM  Final   Special Requests   Final    BOTTLES DRAWN AEROBIC ONLY Blood Culture adequate volume   Culture   Final    NO GROWTH 2 DAYS Performed at [facility], [address] [place], [place] [address]    Report Status PENDING  Incomplete   Today   Subjective    Wallis Mart today has no new complaints -Ambulating back and forth to the bathroom without significant dyspnea on exertion, no chest pains no dizziness or palpitations - Husband at bedside states that patient's mentation and level of alertness is back to baseline -Had BM after lactulose          Patient has been seen and examined prior to discharge   Objective   Blood pressure 117/60, pulse (!) 58, temperature 97.9 F (36.6 C), temperature source Oral, resp. rate 15, height 5' 2"  (1.575 m), weight 85.8 kg, SpO2 100 %.   Intake/Output Summary (Last 24 hours) at 04/16/2022 1459 Last data filed at 04/15/2022 1931 Gross per 24 hour  Intake --  Output 675 ml  Net -675 ml    Exam Gen:- Awake Alert, no acute distress  HEENT:- [place].AT, No sclera icterus Neck-Supple Neck,No JVD,.  Lungs-  CTAB , good air movement bilaterally CV- S1, S2 normal, regular Abd-  +ve B.Sounds, Abd Soft, No tenderness,    Extremity/Skin:- No  edema,   good pulses Psych-affect is appropriate, oriented x3, significantly improved mentation Neuro-generalized weakness , no new focal deficits, no tremors    Data Review   CBC w Diff:  Lab Results  Component Value Date   WBC 6.0 04/15/2022   HGB 9.5 (L) 04/15/2022   HCT 29.5 (L) 04/15/2022   HCT 40 04/04/2011   PLT 144 (L) 04/15/2022   LYMPHOPCT 25 04/15/2022   MONOPCT 9 04/15/2022   EOSPCT 2 04/15/2022   BASOPCT 0 04/15/2022    CMP:  Lab Results  Component Value Date   NA 136 04/16/2022   K 3.7 04/16/2022   CL 102 04/16/2022   CO2 26 04/16/2022   BUN 8  04/16/2022   BUN 14 04/04/2011   CREATININE 1.41 (H) 04/16/2022   CREATININE 0.78 04/04/2011   PROT 6.0 (L) 04/15/2022   ALBUMIN 2.7 (L) 04/16/2022   ALBUMIN 4.4 04/04/2011   BILITOT 0.7 04/15/2022   BILITOT 0.3 04/04/2011   ALKPHOS 109 04/15/2022   ALKPHOS 85 04/04/2011   AST 13 (L) 04/15/2022   AST 17 04/04/2011   ALT 9 04/15/2022  .  Total Discharge time is about 33 minutes  Roxan Hockey M.D on 04/16/2022 at 2:59 PM  Go to [email or web address] -  for contact info  [facility] - Office  [phone]

## 2022-04-16 NOTE — Evaluation (Signed)
Physical Therapy Evaluation Patient Details Name: Megan [name] MRN: [id number] DOB: 1950/11/01 Today's Date: 04/16/2022  History of Present Illness  Megan [name] is a 71 y.o. female with medical history significant of history of anxiety and depression, hypertension, diabetes mellitus type 2, GERD, hypothyroidism, paroxysmal atrial fibrillation, stroke, and more presents the ED with a chief complaint of somnolence and lethargy.  Husband is not at bedside at the time of my exam.  He had reported to the ED provider that patient was brought in because she was somnolent and lethargic throughout the day.  Apparently patient has not been sleeping well at night.  She has Klonopin prescribed 1 mg twice daily as needed anxiety.  She took 2 mg at 9 PM on the night prior to admission.  She reports she was still unable to sleep so she took another 2 mg at 1 PM.  She was then somnolent lethargic throughout the day and had a fall.  She is apparently been having falls at home that are mechanical in nature.  This 1 she does not remember well enough to give me good history.  Apparently the husband had reported to the ER doctor that she fell asleep while ambulatory and that caused her fall.  Patient is foggy on all of those details.  She reports that she came in because of her feet swelling.  She reports that the swelling started on Monday and has been worse since it started.  She has a prescription for 40-80 mg of Lasix.  Patient reports that months ago her doctor told her to break the pill in half and only take 20 mg.  That is what she has been doing.  She denies any dyspnea, orthopnea, chest pain.  She feels like her legs are c-Met, like she cannot lift them up.  She ambulates with a walker at baseline and could not pick her feet up today to ambulate per her report.  On review of systems patient reports that she has been constipated.  Its been 6 days since she has had a bowel movement.  She is taking MiraLAX 3  days ago and not had any success.  Patient denies abdominal pain.  Patient also denies cough and fever.  No other complaints at this time.   Clinical Impression  Patient limited for functional mobility as stated below secondary to BLE weakness, fatigue and impaired standing balance. Patient requires min assist to pull to seated with HOB elevated. She demonstrates good sitting balance and tolerance at EOB. She requires RW and assist to power up to standing due to LE weakness. Patient ambulates with RW with very slow cadence and requires frequent cueing for proper RW use. Patient returned to bed at end of session. Patient would likely benefit from SNF rehab but does not wish to pursue at this time and wants to return home with HHPT and her spouse to assist, he is also agreeable. Patient will benefit from continued physical therapy in hospital and recommended venue below to increase strength, balance, endurance for safe ADLs and gait.        Recommendations for follow up therapy are one component of a multi-disciplinary discharge planning process, led by the attending physician.  Recommendations may be updated based on patient status, additional functional criteria and insurance authorization.  Follow Up Recommendations Home health PT Can patient physically be transported by private vehicle: Yes    Assistance Recommended at Discharge Intermittent Supervision/Assistance  Patient can return home with the following  A little help with walking and/or transfers;A little help with bathing/dressing/bathroom;Assistance with cooking/housework    Equipment Recommendations None recommended by PT  Recommendations for Other Services       Functional Status Assessment Patient has had a recent decline in their functional status and demonstrates the ability to make significant improvements in function in a reasonable and predictable amount of time.     Precautions / Restrictions Precautions Precautions:  Fall Restrictions Weight Bearing Restrictions: No      Mobility  Bed Mobility Overal bed mobility: Needs Assistance Bed Mobility: Supine to Sit, Sit to Supine     Supine to sit: HOB elevated, Min assist Sit to supine: Min guard   General bed mobility comments: increased time, labored movement    Transfers Overall transfer level: Needs assistance Equipment used: Rolling walker (2 wheels) Transfers: Sit to/from Stand Sit to Stand: Min assist, Mod assist           General transfer comment: unsteady labored movement, assist to power up to standing    Ambulation/Gait Ambulation/Gait assistance: Min assist, Mod assist Gait Distance (Feet): 15 Feet Assistive device: Rolling walker (2 wheels) Gait Pattern/deviations: Decreased step length - right, Decreased step length - left, Decreased stride length       General Gait Details: very slow cadence with RW, cueing for proper RW use  Stairs            Wheelchair Mobility    Modified Rankin (Stroke Patients Only)       Balance Overall balance assessment: Needs assistance Sitting-balance support: Feet supported, No upper extremity supported Sitting balance-Leahy Scale: Fair Sitting balance - Comments: fair seated at EOB   Standing balance support: During functional activity, Bilateral upper extremity supported Standing balance-Leahy Scale: Poor Standing balance comment: fair/poor using RW                             Pertinent Vitals/Pain Pain Assessment Pain Assessment: No/denies pain    Home Living Family/patient expects to be discharged to:: Private residence Living Arrangements: Spouse/significant other Available Help at Discharge: Family;Available 24 hours/day Type of Home: Mobile home Home Access: Ramped entrance       Home Layout: One level Home Equipment: Conservation officer, nature (2 wheels);Cane - single point;Grab bars - tub/shower;Shower seat;Grab bars - toilet      Prior Function Prior  Level of Function : Needs assist       Physical Assist : Mobility (physical);ADLs (physical) Mobility (physical): Bed mobility;Transfers;Gait;Stairs   Mobility Comments: household ambulator using RW, assisted for bed mobility ADLs Comments: assisted by family     Hand Dominance   Dominant Hand: Right    Extremity/Trunk Assessment   Upper Extremity Assessment Upper Extremity Assessment: Generalized weakness    Lower Extremity Assessment Lower Extremity Assessment: Generalized weakness    Cervical / Trunk Assessment Cervical / Trunk Assessment: Normal  Communication   Communication: No difficulties  Cognition Arousal/Alertness: Awake/alert Behavior During Therapy: WFL for tasks assessed/performed Overall Cognitive Status: Within Functional Limits for tasks assessed                                          General Comments      Exercises     Assessment/Plan    PT Assessment Patient needs continued PT services  PT Problem List Decreased strength;Decreased activity tolerance;Decreased balance;Decreased  mobility       PT Treatment Interventions DME instruction;Gait training;Stair training;Functional mobility training;Therapeutic activities;Therapeutic exercise;Patient/family education;Balance training    PT Goals (Current goals can be found in the Care Plan section)  Acute Rehab PT Goals Patient Stated Goal: return home with family to assist PT Goal Formulation: With patient/family Time For Goal Achievement: 04/30/22 Potential to Achieve Goals: Good    Frequency Min 3X/week     Co-evaluation               AM-PAC PT "6 Clicks" Mobility  Outcome Measure Help needed turning from your back to your side while in a flat bed without using bedrails?: A Little Help needed moving from lying on your back to sitting on the side of a flat bed without using bedrails?: A Little Help needed moving to and from a bed to a chair (including a  wheelchair)?: A Little Help needed standing up from a chair using your arms (e.g., wheelchair or bedside chair)?: A Little Help needed to walk in hospital room?: A Lot Help needed climbing 3-5 steps with a railing? : A Lot 6 Click Score: 16    End of Session Equipment Utilized During Treatment: Gait belt Activity Tolerance: Patient tolerated treatment well;Patient limited by fatigue Patient left: in bed;with call bell/phone within reach;with family/visitor present Nurse Communication: Mobility status PT Visit Diagnosis: Unsteadiness on feet (R26.81);Other abnormalities of gait and mobility (R26.89);Muscle weakness (generalized) (M62.81)    Time: 3244-0102 PT Time Calculation (min) (ACUTE ONLY): 15 min   Charges:   PT Evaluation $PT Eval Low Complexity: 1 Low PT Treatments $Therapeutic Activity: 8-22 mins        10:12 AM, 04/16/22 Mearl Latin PT, DPT Physical Therapist at [facility]

## 2022-04-16 NOTE — TOC Transition Note (Signed)
Transition of Care [facility]) - CM/SW Discharge Note   Patient Details  Name: Megan [name] MRN: [id number] Date of Birth: 29-Dec-1950  [facility] CM/SW Contact:  Elliot Gault, LCSW Phone Number: 04/16/2022, 3:03 PM   Clinical Narrative:     Pt stable for dc home with HHPT per MD. Sherron Monday with pt/husband to review dc planning. They are agreeable to [facility] referral. CMS provider options reviewed. Referred to [facility] and pt was accepted. No other TOC needs identified for dc.   Barriers to Discharge: Barriers Resolved   Patient Goals and CMS Choice Patient states their goals for this hospitalization and ongoing recovery are:: go home CMS Medicare.gov Compare Post Acute Care list provided to:: Patient Represenative (must comment) Choice offered to / list presented to : Spouse  Expected Discharge Plan and Services   In-house Referral: Clinical Social Work   Post Acute Care Choice: Home Health Living arrangements for the past 2 months: Single Family Home Expected Discharge Date: 04/16/22                         HH Arranged: PT HH Agency: [facility] Date [facility] Agency Contacted: 04/16/22   Representative spoke with at [facility] Agency: Kandee Keen  Prior Living Arrangements/Services Living arrangements for the past 2 months: Single Family Home Lives with:: Spouse Patient language and need for interpreter reviewed:: Yes Do you feel safe going back to the place where you live?: Yes      Need for Family Participation in Patient Care: Yes (Comment) Care giver support system in place?: Yes (comment) Current home services: DME Criminal Activity/Legal Involvement Pertinent to Current Situation/Hospitalization: No - Comment as needed  Activities of Daily Living Home Assistive Devices/Equipment: Bedside commode/3-in-1, Blood pressure cuff, Cane (specify quad or straight), CBG Meter, Dentures (specify type), Eyeglasses, Grab bars in shower, Hand-held shower hose, Raised toilet  seat with rails, Scales, Shower chair with back, Environmental consultant (specify type), Wheelchair ADL Screening (condition at time of admission) Patient's cognitive ability adequate to safely complete daily activities?: Yes Is the patient deaf or have difficulty hearing?: No Does the patient have difficulty seeing, even when wearing glasses/contacts?: No Does the patient have difficulty concentrating, remembering, or making decisions?: No Patient able to express need for assistance with ADLs?: Yes Does the patient have difficulty dressing or bathing?: Yes Independently performs ADLs?: No Communication: Independent Dressing (OT): Needs assistance Is this a change from baseline?: Change from baseline, expected to last <3days Grooming: Needs assistance Is this a change from baseline?: Change from baseline, expected to last <3 days Feeding: Independent Bathing: Needs assistance Is this a change from baseline?: Change from baseline, expected to last <3 days Toileting: Needs assistance Is this a change from baseline?: Change from baseline, expected to last <3 days In/Out Bed: Needs assistance Is this a change from baseline?: Change from baseline, expected to last <3 days Walks in Home: Independent with device (comment) (cane/walker) Does the patient have difficulty walking or climbing stairs?: Yes Weakness of Legs: Both Weakness of Arms/Hands: Both  Permission Sought/Granted Permission sought to share information with : Facility Industrial/product designer granted to share information with : Yes, Verbal Permission Granted     Permission granted to share info w AGENCY: HH        Emotional Assessment       Orientation: : Oriented to Self, Oriented to  Time, Oriented to Situation, Oriented to Place Alcohol / Substance Use: Not Applicable Psych  Involvement: No (comment)  Admission diagnosis:  Acute respiratory failure with hypoxia ([place]) [J96.01] Fall, initial encounter  [W19.XXXA] Hypotension, unspecified hypotension type [N05.3] Acute metabolic encephalopathy [Z76.73] Benzodiazepine intoxication ([place]) [F13.129] Pneumonia due to infectious organism, unspecified laterality, unspecified part of lung [J18.9] Patient Active Problem List   Diagnosis Date Noted   CAP (community acquired pneumonia) 04/14/2022   Hypothyroidism 04/14/2022   Peripheral edema 04/14/2022   Fall at home, initial encounter 04/14/2022   Benzodiazepine overdose 04/14/2022   Acute respiratory failure with hypoxia ([place]) 04/14/2022   Sepsis ([place]) 03/30/2022   AKI (acute kidney injury) ([place]) 03/30/2022   Septic shock ([place]) 03/30/2022   Closed fracture of navicular bone of left wrist  05/13/18 41/93/7902   Umbilical pain 40/97/3532   Toxic encephalopathy 12/21/2017   Hypotension due to drugs 12/21/2017   Confusion 12/20/2017   Hypotension 12/20/2017   UTI (urinary tract infection) 12/20/2017   Atrial fibrillation, chronic ([place]) 11/17/2017   Current use of long term anticoagulation 11/17/2017   Weakness 99/24/2683   Acute metabolic encephalopathy 41/96/2229   PUD (peptic ulcer disease) 04/07/2016   Constipation 04/07/2016   Diverticulosis of colon without hemorrhage    Rectal bleeding    BRBPR (bright red blood per rectum) 01/25/2016   Hypoglycemia 01/20/2016   Upper GI bleeding 01/20/2016   H/O: CVA (cerebrovascular accident) 01/20/2016   Chronic pain 01/20/2016   DM type 2 (diabetes mellitus, type 2) ([place]) 01/20/2016   Benign essential HTN 01/20/2016   Anxiety and depression 01/20/2016   Blood loss anemia 01/20/2016   Knee pain, acute    Cephalalgia    GERD (gastroesophageal reflux disease) 06/14/2011   FH: colon cancer 04/05/2011   History of colon polyps 04/05/2011   Left sided abdominal pain 04/05/2011   N&V (nausea and vomiting) 04/05/2011   PCP:  Lemmie Evens, MD Pharmacy:   [place] [place] [facility] [address] [place]  [address] Phone: [phone] Fax: [phone]     Social Determinants of Health (SDOH) Interventions    Readmission Risk Interventions     No data to display           Final next level of care: Home w Home Health Services Barriers to Discharge: Barriers Resolved   Patient Goals and CMS Choice Patient states their goals for this hospitalization and ongoing recovery are:: go home CMS Medicare.gov Compare Post Acute Care list provided to:: Patient Represenative (must comment) Choice offered to / list presented to : Spouse  Discharge Placement                       Discharge Plan and Services In-house Referral: Clinical Social Work   Post Acute Care Choice: Home Health                    HH Arranged: PT [facility] Agency: [place] Date [place]: 04/16/22   Representative spoke with at [place]: [place] ([place]) Interventions     Readmission Risk Interventions     No data to display

## 2022-04-16 NOTE — Discharge Instructions (Signed)
1)Please take medications including Cephalexin and Azithromycin as prescribed 2) follow-up primary care physician within a week for repeat BMP and CBC blood test 3) please take Klonopin/clonazepam and oxycodone only as prescribed--- please do not take any extras

## 2022-04-16 NOTE — Plan of Care (Signed)
  Problem: Acute Rehab PT Goals(only PT should resolve) Goal: Patient Will Transfer Sit To/From Stand Outcome: Progressing Flowsheets (Taken 04/16/2022 1013) Patient will transfer sit to/from stand: with min guard assist Goal: Pt Will Transfer Bed To Chair/Chair To Bed Outcome: Progressing Flowsheets (Taken 04/16/2022 1013) Pt will Transfer Bed to Chair/Chair to Bed: min guard assist Goal: Pt Will Ambulate Outcome: Progressing Flowsheets (Taken 04/16/2022 1013) Pt will Ambulate:  25 feet  with min guard assist  with minimal assist  with rolling walker Goal: Pt/caregiver will Perform Home Exercise Program Outcome: Progressing Flowsheets (Taken 04/16/2022 1013) Pt/caregiver will Perform Home Exercise Program:  For increased strengthening  For improved balance  Independently  10:14 AM, 04/16/22 Mearl Latin PT, DPT Physical Therapist at [facility]

## 2022-04-17 LAB — URINE CULTURE: Culture: NO GROWTH

## 2022-04-17 LAB — LEGIONELLA PNEUMOPHILA SEROGP 1 UR AG: L. pneumophila Serogp 1 Ur Ag: NEGATIVE

## 2022-04-19 LAB — CULTURE, BLOOD (ROUTINE X 2)
Culture: NO GROWTH
Culture: NO GROWTH
Special Requests: ADEQUATE
Special Requests: ADEQUATE

## 2022-06-09 ENCOUNTER — Emergency Department (HOSPITAL_COMMUNITY): Payer: Medicare Other

## 2022-06-09 ENCOUNTER — Inpatient Hospital Stay (HOSPITAL_COMMUNITY)
Admission: EM | Admit: 2022-06-09 | Discharge: 2022-06-14 | DRG: 871 | Disposition: A | Discharge status: Home Health | Payer: Medicare Other | Attending: Internal Medicine | Admitting: Internal Medicine

## 2022-06-09 DIAGNOSIS — Z1152 Encounter for screening for COVID-19: Secondary | ICD-10-CM | POA: Diagnosis not present

## 2022-06-09 DIAGNOSIS — N390 Urinary tract infection, site not specified: Secondary | ICD-10-CM | POA: Diagnosis present

## 2022-06-09 DIAGNOSIS — Z79891 Long term (current) use of opiate analgesic: Secondary | ICD-10-CM

## 2022-06-09 DIAGNOSIS — E039 Hypothyroidism, unspecified: Secondary | ICD-10-CM | POA: Diagnosis present

## 2022-06-09 DIAGNOSIS — N1832 Chronic kidney disease, stage 3b: Secondary | ICD-10-CM | POA: Diagnosis present

## 2022-06-09 DIAGNOSIS — G9341 Metabolic encephalopathy: Secondary | ICD-10-CM | POA: Diagnosis present

## 2022-06-09 DIAGNOSIS — E1122 Type 2 diabetes mellitus with diabetic chronic kidney disease: Secondary | ICD-10-CM | POA: Diagnosis present

## 2022-06-09 DIAGNOSIS — Z720 Tobacco use: Secondary | ICD-10-CM | POA: Diagnosis not present

## 2022-06-09 DIAGNOSIS — R6521 Severe sepsis with septic shock: Secondary | ICD-10-CM | POA: Diagnosis present

## 2022-06-09 DIAGNOSIS — Z8673 Personal history of transient ischemic attack (TIA), and cerebral infarction without residual deficits: Secondary | ICD-10-CM

## 2022-06-09 DIAGNOSIS — Z800 Family history of malignant neoplasm of digestive organs: Secondary | ICD-10-CM

## 2022-06-09 DIAGNOSIS — J181 Lobar pneumonia, unspecified organism: Secondary | ICD-10-CM | POA: Diagnosis not present

## 2022-06-09 DIAGNOSIS — T68XXXA Hypothermia, initial encounter: Secondary | ICD-10-CM | POA: Diagnosis present

## 2022-06-09 DIAGNOSIS — G894 Chronic pain syndrome: Secondary | ICD-10-CM | POA: Diagnosis present

## 2022-06-09 DIAGNOSIS — W010XXA Fall on same level from slipping, tripping and stumbling without subsequent striking against object, initial encounter: Secondary | ICD-10-CM | POA: Diagnosis present

## 2022-06-09 DIAGNOSIS — I10 Essential (primary) hypertension: Secondary | ICD-10-CM | POA: Diagnosis present

## 2022-06-09 DIAGNOSIS — Z886 Allergy status to analgesic agent status: Secondary | ICD-10-CM

## 2022-06-09 DIAGNOSIS — A419 Sepsis, unspecified organism: Principal | ICD-10-CM | POA: Diagnosis present

## 2022-06-09 DIAGNOSIS — Z66 Do not resuscitate: Secondary | ICD-10-CM | POA: Diagnosis present

## 2022-06-09 DIAGNOSIS — Z7984 Long term (current) use of oral hypoglycemic drugs: Secondary | ICD-10-CM

## 2022-06-09 DIAGNOSIS — I482 Chronic atrial fibrillation, unspecified: Secondary | ICD-10-CM

## 2022-06-09 DIAGNOSIS — N179 Acute kidney failure, unspecified: Secondary | ICD-10-CM | POA: Diagnosis present

## 2022-06-09 DIAGNOSIS — K219 Gastro-esophageal reflux disease without esophagitis: Secondary | ICD-10-CM | POA: Diagnosis present

## 2022-06-09 DIAGNOSIS — Z7901 Long term (current) use of anticoagulants: Secondary | ICD-10-CM

## 2022-06-09 DIAGNOSIS — E119 Type 2 diabetes mellitus without complications: Secondary | ICD-10-CM

## 2022-06-09 DIAGNOSIS — E785 Hyperlipidemia, unspecified: Secondary | ICD-10-CM | POA: Diagnosis present

## 2022-06-09 DIAGNOSIS — F4001 Agoraphobia with panic disorder: Secondary | ICD-10-CM | POA: Diagnosis present

## 2022-06-09 DIAGNOSIS — R296 Repeated falls: Secondary | ICD-10-CM | POA: Diagnosis present

## 2022-06-09 DIAGNOSIS — I129 Hypertensive chronic kidney disease with stage 1 through stage 4 chronic kidney disease, or unspecified chronic kidney disease: Secondary | ICD-10-CM | POA: Diagnosis present

## 2022-06-09 DIAGNOSIS — E876 Hypokalemia: Secondary | ICD-10-CM | POA: Diagnosis present

## 2022-06-09 DIAGNOSIS — E669 Obesity, unspecified: Secondary | ICD-10-CM | POA: Diagnosis present

## 2022-06-09 DIAGNOSIS — L8990 Pressure ulcer of unspecified site, unspecified stage: Secondary | ICD-10-CM | POA: Insufficient documentation

## 2022-06-09 DIAGNOSIS — I480 Paroxysmal atrial fibrillation: Secondary | ICD-10-CM | POA: Diagnosis present

## 2022-06-09 DIAGNOSIS — Z79899 Other long term (current) drug therapy: Secondary | ICD-10-CM

## 2022-06-09 DIAGNOSIS — Z7952 Long term (current) use of systemic steroids: Secondary | ICD-10-CM

## 2022-06-09 DIAGNOSIS — E869 Volume depletion, unspecified: Secondary | ICD-10-CM | POA: Diagnosis present

## 2022-06-09 DIAGNOSIS — M1990 Unspecified osteoarthritis, unspecified site: Secondary | ICD-10-CM | POA: Diagnosis present

## 2022-06-09 DIAGNOSIS — Z6835 Body mass index (BMI) 35.0-35.9, adult: Secondary | ICD-10-CM

## 2022-06-09 DIAGNOSIS — G8929 Other chronic pain: Secondary | ICD-10-CM | POA: Diagnosis present

## 2022-06-09 DIAGNOSIS — Z86010 Personal history of colonic polyps: Secondary | ICD-10-CM

## 2022-06-09 DIAGNOSIS — E860 Dehydration: Secondary | ICD-10-CM | POA: Diagnosis present

## 2022-06-09 DIAGNOSIS — R4189 Other symptoms and signs involving cognitive functions and awareness: Secondary | ICD-10-CM | POA: Diagnosis present

## 2022-06-09 LAB — CBC WITH DIFFERENTIAL/PLATELET
Abs Immature Granulocytes: 0.02 10*3/uL (ref 0.00–0.07)
Basophils Absolute: 0 10*3/uL (ref 0.0–0.1)
Basophils Relative: 0 %
Eosinophils Absolute: 0.1 10*3/uL (ref 0.0–0.5)
Eosinophils Relative: 1 %
HCT: 36.2 % (ref 36.0–46.0)
Hemoglobin: 11.6 g/dL — ABNORMAL LOW (ref 12.0–15.0)
Immature Granulocytes: 0 %
Lymphocytes Relative: 32 %
Lymphs Abs: 1.9 10*3/uL (ref 0.7–4.0)
MCH: 31.6 pg (ref 26.0–34.0)
MCHC: 32 g/dL (ref 30.0–36.0)
MCV: 98.6 fL (ref 80.0–100.0)
Monocytes Absolute: 0.3 10*3/uL (ref 0.1–1.0)
Monocytes Relative: 5 %
Neutro Abs: 3.7 10*3/uL (ref 1.7–7.7)
Neutrophils Relative %: 62 %
Platelets: 173 10*3/uL (ref 150–400)
RBC: 3.67 MIL/uL — ABNORMAL LOW (ref 3.87–5.11)
RDW: 14.1 % (ref 11.5–15.5)
WBC: 6 10*3/uL (ref 4.0–10.5)
nRBC: 0 % (ref 0.0–0.2)

## 2022-06-09 LAB — COMPREHENSIVE METABOLIC PANEL
ALT: 11 U/L (ref 0–44)
AST: 22 U/L (ref 15–41)
Albumin: 2.6 g/dL — ABNORMAL LOW (ref 3.5–5.0)
Alkaline Phosphatase: 128 U/L — ABNORMAL HIGH (ref 38–126)
Anion gap: 13 (ref 5–15)
BUN: 31 mg/dL — ABNORMAL HIGH (ref 8–23)
CO2: 25 mmol/L (ref 22–32)
Calcium: 8.6 mg/dL — ABNORMAL LOW (ref 8.9–10.3)
Chloride: 102 mmol/L (ref 98–111)
Creatinine, Ser: 2.14 mg/dL — ABNORMAL HIGH (ref 0.44–1.00)
GFR, Estimated: 24 mL/min — ABNORMAL LOW (ref >60)
Glucose, Bld: 112 mg/dL — ABNORMAL HIGH (ref 70–99)
Potassium: 3.2 mmol/L — ABNORMAL LOW (ref 3.5–5.1)
Sodium: 140 mmol/L (ref 135–145)
Total Bilirubin: 0.4 mg/dL (ref 0.3–1.2)
Total Protein: 5.9 g/dL — ABNORMAL LOW (ref 6.5–8.1)

## 2022-06-09 LAB — GLUCOSE, CAPILLARY
Glucose-Capillary: 93 mg/dL (ref 70–99)
Glucose-Capillary: 123 mg/dL — ABNORMAL HIGH (ref 70–99)
Glucose-Capillary: 142 mg/dL — ABNORMAL HIGH (ref 70–99)

## 2022-06-09 LAB — URINALYSIS, ROUTINE W REFLEX MICROSCOPIC
Bilirubin Urine: NEGATIVE
Glucose, UA: >=500 mg/dL — AB
Hgb urine dipstick: NEGATIVE
Ketones, ur: NEGATIVE mg/dL
Nitrite: NEGATIVE
Protein, ur: 30 mg/dL — AB
Specific Gravity, Urine: 1.016 (ref 1.005–1.030)
WBC, UA: >50 WBC/hpf — ABNORMAL HIGH (ref 0–5)
pH: 5 (ref 5.0–8.0)

## 2022-06-09 LAB — APTT: aPTT: 25 seconds (ref 24–36)

## 2022-06-09 LAB — LACTIC ACID, PLASMA
Lactic Acid, Venous: 3 mmol/L (ref 0.5–1.9)
Lactic Acid, Venous: 3.2 mmol/L (ref 0.5–1.9)
Lactic Acid, Venous: 1.6 mmol/L (ref 0.5–1.9)
Lactic Acid, Venous: 1.3 mmol/L (ref 0.5–1.9)

## 2022-06-09 LAB — TYPE AND SCREEN
ABO/RH(D): O POS
Antibody Screen: NEGATIVE

## 2022-06-09 LAB — RESP PANEL BY RT-PCR (FLU A&B, COVID) ARPGX2
Influenza A by PCR: NEGATIVE
Influenza B by PCR: NEGATIVE
SARS Coronavirus 2 by RT PCR: NEGATIVE

## 2022-06-09 LAB — TROPONIN I (HIGH SENSITIVITY)
Troponin I (High Sensitivity): 11 ng/L (ref <18)
Troponin I (High Sensitivity): 10 ng/L (ref <18)

## 2022-06-09 LAB — POC OCCULT BLOOD, ED: Fecal Occult Bld: NEGATIVE

## 2022-06-09 LAB — CORTISOL: Cortisol, Plasma: 16.7 ug/dL

## 2022-06-09 LAB — PROTIME-INR
INR: 1.2 (ref 0.8–1.2)
Prothrombin Time: 15 seconds (ref 11.4–15.2)

## 2022-06-09 MED ORDER — APIXABAN 5 MG PO TABS
5.0000 mg | ORAL_TABLET | Freq: Two times a day (BID) | ORAL | Status: DC
  Administered 2022-06-09 – 2022-06-14 (×10): 5 mg via ORAL
  Filled 2022-06-09 (×10): qty 1

## 2022-06-09 MED ORDER — POTASSIUM CHLORIDE IN NACL 40-0.9 MEQ/L-% IV SOLN
INTRAVENOUS | Status: DC
  Filled 2022-06-09 (×3): qty 1000

## 2022-06-09 MED ORDER — ACETAMINOPHEN 650 MG RE SUPP: 650.0000 mg | Freq: Four times a day (QID) | RECTAL | Status: DC | PRN

## 2022-06-09 MED ORDER — PANTOPRAZOLE SODIUM 40 MG PO TBEC
40.0000 mg | DELAYED_RELEASE_TABLET | Freq: Two times a day (BID) | ORAL | Status: DC
  Administered 2022-06-09 – 2022-06-14 (×10): 40 mg via ORAL
  Filled 2022-06-09 (×10): qty 1

## 2022-06-09 MED ORDER — VANCOMYCIN HCL IN DEXTROSE 1-5 GM/200ML-% IV SOLN: 1000.0000 mg | INTRAVENOUS | Status: DC

## 2022-06-09 MED ORDER — SODIUM CHLORIDE 0.9 % IV SOLN
250.0000 mL | INTRAVENOUS | Status: DC
  Administered 2022-06-09: 250 mL via INTRAVENOUS

## 2022-06-09 MED ORDER — SODIUM CHLORIDE 0.9 % IV SOLN
2.0000 g | Freq: Once | INTRAVENOUS | Status: AC
  Administered 2022-06-09: 2 g via INTRAVENOUS
  Filled 2022-06-09: qty 12.5

## 2022-06-09 MED ORDER — SODIUM CHLORIDE 0.9 % IV SOLN
2.0000 g | INTRAVENOUS | Status: DC
  Administered 2022-06-09: 2 g via INTRAVENOUS
  Filled 2022-06-09: qty 12.5

## 2022-06-09 MED ORDER — NALOXONE HCL 0.4 MG/ML IJ SOLN
1.0000 mg | Freq: Once | INTRAMUSCULAR | Status: AC
  Administered 2022-06-09: 1 mg via INTRAVENOUS
  Filled 2022-06-09: qty 3

## 2022-06-09 MED ORDER — SODIUM CHLORIDE 0.9 % IV SOLN: 2.0000 g | Freq: Two times a day (BID) | INTRAVENOUS | Status: DC

## 2022-06-09 MED ORDER — INSULIN ASPART 100 UNIT/ML IJ SOLN
0.0000 [IU] | INTRAMUSCULAR | Status: DC
  Administered 2022-06-09 – 2022-06-10 (×3): 2 [IU] via SUBCUTANEOUS
  Administered 2022-06-10: 5 [IU] via SUBCUTANEOUS
  Administered 2022-06-11: 2 [IU] via SUBCUTANEOUS
  Administered 2022-06-11: 3 [IU] via SUBCUTANEOUS

## 2022-06-09 MED ORDER — ALBUTEROL SULFATE (2.5 MG/3ML) 0.083% IN NEBU: 2.5000 mg | INHALATION_SOLUTION | RESPIRATORY_TRACT | Status: DC | PRN

## 2022-06-09 MED ORDER — ATORVASTATIN CALCIUM 40 MG PO TABS
80.0000 mg | ORAL_TABLET | Freq: Every day | ORAL | Status: DC
  Administered 2022-06-10 – 2022-06-14 (×5): 80 mg via ORAL
  Filled 2022-06-09 (×5): qty 2

## 2022-06-09 MED ORDER — SODIUM CHLORIDE 0.9 % IV BOLUS
3000.0000 mL | Freq: Once | INTRAVENOUS | Status: AC
  Administered 2022-06-09: 3000 mL via INTRAVENOUS

## 2022-06-09 MED ORDER — METRONIDAZOLE 500 MG/100ML IV SOLN
500.0000 mg | Freq: Once | INTRAVENOUS | Status: AC
  Administered 2022-06-09: 500 mg via INTRAVENOUS
  Filled 2022-06-09: qty 100

## 2022-06-09 MED ORDER — LEVOTHYROXINE SODIUM 25 MCG PO TABS
25.0000 ug | ORAL_TABLET | Freq: Every day | ORAL | Status: DC
  Administered 2022-06-10 – 2022-06-14 (×5): 25 ug via ORAL
  Filled 2022-06-09 (×5): qty 1

## 2022-06-09 MED ORDER — VANCOMYCIN HCL 1750 MG/350ML IV SOLN
1750.0000 mg | Freq: Once | INTRAVENOUS | Status: AC
  Administered 2022-06-09: 1750 mg via INTRAVENOUS
  Filled 2022-06-09: qty 350

## 2022-06-09 MED ORDER — HYDROCORTISONE SOD SUC (PF) 100 MG IJ SOLR
100.0000 mg | Freq: Three times a day (TID) | INTRAMUSCULAR | Status: DC
  Administered 2022-06-09 – 2022-06-10 (×3): 100 mg via INTRAVENOUS
  Filled 2022-06-09 (×3): qty 2

## 2022-06-09 MED ORDER — SODIUM CHLORIDE 0.9 % IV BOLUS
1000.0000 mL | Freq: Once | INTRAVENOUS | Status: AC
  Administered 2022-06-09: 1000 mL via INTRAVENOUS

## 2022-06-09 MED ORDER — NOREPINEPHRINE 4 MG/250ML-% IV SOLN
2.0000 ug/min | INTRAVENOUS | Status: DC
  Administered 2022-06-09: 2 ug/min via INTRAVENOUS
  Filled 2022-06-09: qty 250

## 2022-06-09 MED ORDER — LACTULOSE 10 GM/15ML PO SOLN
10.0000 g | Freq: Two times a day (BID) | ORAL | Status: DC
  Administered 2022-06-09 – 2022-06-10 (×3): 10 g via ORAL
  Filled 2022-06-09 (×3): qty 30

## 2022-06-09 MED ORDER — VANCOMYCIN HCL IN DEXTROSE 1-5 GM/200ML-% IV SOLN: 1000.0000 mg | Freq: Once | INTRAVENOUS | Status: DC

## 2022-06-09 MED ORDER — ONDANSETRON HCL 4 MG/2ML IJ SOLN
4.0000 mg | Freq: Four times a day (QID) | INTRAMUSCULAR | Status: DC | PRN
  Administered 2022-06-11: 4 mg via INTRAVENOUS
  Filled 2022-06-09 (×2): qty 2

## 2022-06-09 MED ORDER — ONDANSETRON HCL 4 MG PO TABS: 4.0000 mg | ORAL_TABLET | Freq: Four times a day (QID) | ORAL | Status: DC | PRN

## 2022-06-09 MED ORDER — ACETAMINOPHEN 325 MG PO TABS: 650.0000 mg | ORAL_TABLET | Freq: Four times a day (QID) | ORAL | Status: DC | PRN

## 2022-06-09 MED ORDER — POLYETHYLENE GLYCOL 3350 17 G PO PACK
17.0000 g | PACK | Freq: Two times a day (BID) | ORAL | Status: DC
  Administered 2022-06-09 – 2022-06-11 (×3): 17 g via ORAL
  Filled 2022-06-09 (×4): qty 1

## 2022-06-09 NOTE — ED Triage Notes (Signed)
Pt arrives via [facility]. EMS from home for altered mental status. Per EMS pt was found sitting on toilet unresponsive to her husband. Pt reportedly was initially hypotensive with SBP in 70's. Per husband, pt is not prescribed pain medication, but he believes that she took his oxycodone as well as clonazepam. Pt alert and oriented x4 on arrival, states that she took two oxycodones, that are prescribed to her. Received 500 mL NS BOLUS by EMS with BP improvement.    Triage BP 69/39

## 2022-06-09 NOTE — ED Notes (Signed)
Discussed order for levophed with Dr. Kerry Hough. He initially stated to start levo after 30 min hydrocortisone if BP did not improve. Discussed with at the appropriate time and he stated to hold for now. Will continue to monitor.

## 2022-06-09 NOTE — Sepsis Progress Note (Signed)
Ed provider and admitting provider messaged asking to consider ordering 3rd lactic

## 2022-06-09 NOTE — H&P (Signed)
History and Physical    Megan [name] [id number] DOB: [birth year] DOA: 06/09/2022  PCP: Lemmie Evens, MD  Patient coming from: Home  I have personally briefly reviewed patient's old medical records in [place]  Chief Complaint: Somnolence  HPI: Megan [name] is a 71 y.o. female with medical history significant of paroxysmal A-fib on anticoagulation, chronic pain syndrome on opiates, diabetes, hypertension, hypothyroidism, prior stroke, anxiety on benzodiazepines, chronic steroid use for arthritis, who lives at home with her husband.  Her husband reports that he has noted decreased p.o. intake for the last several weeks to months.  She is able to ambulate with a walker, but he notes that she has been stumbling and falling more lately.  Patient is currently somnolent/confused and therefore history is mostly obtained from her husband.  He does not report noting any fever recently.  He does remember her complaining of intermittent dysuria for the past several days.  She has not had any vomiting or diarrhea.  She did not complain of shortness of breath or cough.  She got up this morning and had to use the bathroom.  Husband noted that she had spent a long time in the bathroom.  When he went to check on her she was difficult to wake up.  ED Course: On arrival to the emergency room, she was noted to be hypothermic, hypotensive.  Labs show acute kidney injury.  Urinalysis indicates possible infection.  Chest x-ray notes possible developing pneumonia.  She was bolused with IV fluids and received intravenous antibiotics.  Blood pressure still remains low.  She has been referred for admission.  Review of Systems: Unable to assess due to patient's mental status   Past Medical History:  Diagnosis Date   Agoraphobia with panic disorder    Anxiety    Arterial hypotension 12/20/2017   Arthritis    Chronic pain 03/08/15   Hydrocodone-APAP 10/325 mg, # 120 q month   Colon polyps     Complication of anesthesia    Depression    DM (diabetes mellitus) ([place])    GERD (gastroesophageal reflux disease)    HTN (hypertension)    Hypothyroidism    Obesity    PAF (paroxysmal atrial fibrillation) (HCC)    PONV (postoperative nausea and vomiting)    Stroke ([place]) 08/20/2014   some memory loss.    Past Surgical History:  Procedure Laterality Date   ABDOMINAL HYSTERECTOMY  15 yrs ago   with bso at [place]  2004   normal   CATARACT EXTRACTION W/PHACO Right 11/10/2019   Procedure: CATARACT EXTRACTION PHACO AND INTRAOCULAR LENS PLACEMENT RIGHT EYE;  Surgeon: Baruch Goldmann, MD;  Location: [facility];  Service: Ophthalmology;  Laterality: Right;  CDE: 46.63   CATARACT EXTRACTION W/PHACO Left 11/24/2019   Procedure: CATARACT EXTRACTION PHACO AND INTRAOCULAR LENS PLACEMENT (IOC) CDE: 7.95;  Surgeon: Baruch Goldmann, MD;  Location: [facility];  Service: Ophthalmology;  Laterality: Left;   CHOLECYSTECTOMY  25 yrs ago   [facility]   COLONOSCOPY  05/04/11   left-sided colonic diverticulosis   COLONOSCOPY N/A 01/27/2016   diverticulosis in sigmoid colon and descending colon, active bleeding Dieulafoy lesion s/p clips X 3, next colonoscopy in 5 years due to family history of colon cancer     ESOPHAGOGASTRODUODENOSCOPY  05/04/2011   Abnormal distal esophagus, biopsies consistent with inflammation due to acid reflux. No evidence of Barrett's. No H. pylori   ESOPHAGOGASTRODUODENOSCOPY N/A 01/22/2016   erosive gastropathy, non-bleeding gastric ulcers  and 1 oozing gastric ulcer with adherent clot s/p heater probe. Negative H.pylori serology.    ESOPHAGOGASTRODUODENOSCOPY (EGD) WITH PROPOFOL N/A 04/27/2016   Dr. Jena Gauss: healed ulcers. normal esophagus   RADIOLOGY WITH ANESTHESIA N/A 08/20/2015   Procedure: RADIOLOGY WITH ANESTHESIA;  Surgeon: Julieanne Cotton, MD;  Location: [facility];  Service: Radiology;  Laterality: N/A;    Social History:  reports that she quit smoking about 50 years ago.  Her smoking use included cigarettes. She started smoking about 52 years ago. She has a 3.75 pack-year smoking history. She quit smokeless tobacco use about 7 years ago.  Her smokeless tobacco use included snuff. She reports that she does not drink alcohol and does not use drugs.  Allergies  Allergen Reactions   Naproxen Other (See Comments)    Causes stomach burning    Family History  Problem Relation Age of Onset   Colon cancer Paternal Aunt        2, colon cancer   Colon cancer Paternal Uncle        6, colon cancer   Colon cancer Father        deceased, age 42   Colon cancer Other        numerous cousins died before age 44 with colon cancer   Colon cancer Sister        deceased, age 28   Anesthesia problems Neg Hx    Hypotension Neg Hx    Malignant hyperthermia Neg Hx    Pseudochol deficiency Neg Hx      Prior to Admission medications   Medication Sig Start Date End Date Taking? Authorizing Provider  atorvastatin (LIPITOR) 80 MG tablet Take 80 mg by mouth daily. 05/18/22  Yes [provider]  enalapril (VASOTEC) 10 MG tablet Take 10 mg by mouth daily. 05/18/22  Yes [provider]  escitalopram (LEXAPRO) 20 MG tablet SMARTSIG:1 Tablet(s) By Mouth 05/18/22  Yes [provider]  gabapentin (NEURONTIN) 100 MG capsule Take 100 mg by mouth 3 (three) times daily. 04/29/22  Yes [provider]  lactulose (CHRONULAC) 10 GM/15ML solution Take 10 g by mouth 2 (two) times daily. 05/26/22  Yes [provider]  pantoprazole (PROTONIX) 40 MG tablet Take 40 mg by mouth 2 (two) times daily. 05/18/22  Yes [provider]  acetaminophen (TYLENOL) 325 MG tablet Take 2 tablets (650 mg total) by mouth every 6 (six) hours as needed for mild pain, moderate pain or headache. 11/19/17   Laural Benes, Clanford L, MD  apixaban (ELIQUIS) 5 MG TABS tablet Take 1 tablet (5 mg total) by mouth 2 (two) times daily. Restart on 7/14 Patient taking differently: Take 5 mg  by mouth daily. 01/27/16   Erick Blinks, MD  Cholecalciferol (VITAMIN D-3 PO) Take 2,000 Units by mouth daily.     [provider]  clonazePAM (KLONOPIN) 1 MG tablet Take 1 mg by mouth 2 (two) times daily as needed for anxiety.    [provider]  Cyanocobalamin (B-12 PO) Take by mouth daily.    [provider]  fluticasone (FLONASE) 50 MCG/ACT nasal spray Place 2 sprays into both nostrils daily. 03/22/22   [provider]  furosemide (LASIX) 40 MG tablet Take 40-80 mg by mouth daily as needed. 03/22/22   [provider]  JARDIANCE 10 MG TABS tablet Take 10 mg by mouth every morning. 03/22/22   [provider]  levothyroxine (SYNTHROID, LEVOTHROID) 25 MCG tablet Take 25 mcg by mouth daily. 07/14/15   [provider]  metFORMIN (GLUCOPHAGE) 500 MG tablet Take 1,000 mg by mouth 2 (two) times daily. 03/22/22   [provider]  metoprolol succinate (TOPROL-XL) 50 MG 24 hr tablet Take 1 tablet (50 mg total) by mouth daily with lunch. 12/22/17   Johnson, Clanford L, MD  ondansetron (ZOFRAN) 4 MG tablet 4 mg every 8 (eight) hours as needed for vomiting or nausea. 03/22/22   [provider]  oxyCODONE-acetaminophen (PERCOCET/ROXICET) 5-325 MG tablet Take 1 tablet by mouth every 6 (six) hours as needed for moderate pain. 03/08/22   [provider]  polyethylene glycol (MIRALAX) 17 g packet Take 17 g by mouth 2 (two) times daily. 04/16/22   Roxan Hockey, MD  predniSONE (DELTASONE) 5 MG tablet Take 5 mg by mouth daily.    [provider]  senna-docusate (SENOKOT-S) 8.6-50 MG tablet Take 2 tablets by mouth at bedtime. 04/16/22   Roxan Hockey, MD  traZODone (DESYREL) 100 MG tablet Take 1 tablet (100 mg total) by mouth at bedtime. For sleep and Mood 04/16/22   Roxan Hockey, MD    Physical Exam: Vitals:   06/09/22 1200 06/09/22 1230 06/09/22 1300 06/09/22 1313  BP: (!) 88/63 101/83 (!) 94/55   Pulse: 88     Resp:  (!) 24  18   Temp: (!) 94.1 F (34.5 C)  (!) 95.4 F (35.2 C) (!) 95.9 F (35.5 C)  TempSrc:    Bladder  SpO2:        Constitutional: NAD, calm, comfortable Eyes: PERRL, lids and conjunctivae normal ENMT: Mucous membranes are moist. Posterior pharynx clear of any exudate or lesions.Normal dentition.  Neck: normal, supple, no masses, no thyromegaly Respiratory: clear to auscultation bilaterally, no wheezing, no crackles. Normal respiratory effort. No accessory muscle use.  Cardiovascular: Irregular, no murmurs / rubs / gallops. No extremity edema. 2+ pedal pulses. No carotid bruits.  Abdomen: Tenderness in left abdomen which appears to be chronic finding, no masses palpated. No hepatosplenomegaly. Bowel sounds positive.  Musculoskeletal: no clubbing / cyanosis. No joint deformity upper and lower extremities. Good ROM, no contractures. Normal muscle tone.  Skin: no rashes, lesions, ulcers. No induration Neurologic: CN 2-12 grossly intact. Sensation intact, DTR normal. Strength 5/5 in all 4.  Psychiatric: Somnolent, appears to be mildly confused   Labs on Admission: I have personally reviewed following labs and imaging studies  CBC: Recent Labs  Lab 06/09/22 1020  WBC 6.0  NEUTROABS 3.7  HGB 11.6*  HCT 36.2  MCV 98.6  PLT [id number]   Basic Metabolic Panel: Recent Labs  Lab 06/09/22 1020  NA 140  K 3.2*  CL 102  CO2 25  GLUCOSE 112*  BUN 31*  CREATININE 2.14*  CALCIUM 8.6*   GFR: CrCl cannot be calculated (Unknown ideal weight.). Liver Function Tests: Recent Labs  Lab 06/09/22 1020  AST 22  ALT 11  ALKPHOS 128*  BILITOT 0.4  PROT 5.9*  ALBUMIN 2.6*   No results for input(s): "LIPASE", "AMYLASE" in the last 168 hours. No results for input(s): "AMMONIA" in the last 168 hours. Coagulation Profile: Recent Labs  Lab 06/09/22 1020  INR 1.2   Cardiac Enzymes: No results for input(s): "CKTOTAL", "CKMB", "CKMBINDEX", "TROPONINI" in the last 168 hours. BNP (last 3  results) No results for input(s): "PROBNP" in the last 8760 hours. HbA1C: No results for input(s): "HGBA1C" in the last 72 hours. CBG: No results for input(s): "GLUCAP" in the last 168 hours. Lipid Profile: No results for input(s): "CHOL", "HDL", "  LDLCALC", "TRIG", "CHOLHDL", "LDLDIRECT" in the last 72 hours. Thyroid Function Tests: No results for input(s): "TSH", "T4TOTAL", "FREET4", "T3FREE", "THYROIDAB" in the last 72 hours. Anemia Panel: No results for input(s): "VITAMINB12", "FOLATE", "FERRITIN", "TIBC", "IRON", "RETICCTPCT" in the last 72 hours. Urine analysis:    Component Value Date/Time   COLORURINE YELLOW 06/09/2022 1015   APPEARANCEUR CLOUDY (A) 06/09/2022 1015   LABSPEC 1.016 06/09/2022 1015   PHURINE 5.0 06/09/2022 1015   GLUCOSEU >=500 (A) 06/09/2022 1015   HGBUR NEGATIVE 06/09/2022 1015   BILIRUBINUR NEGATIVE 06/09/2022 1015   KETONESUR NEGATIVE 06/09/2022 1015   PROTEINUR 30 (A) 06/09/2022 1015   UROBILINOGEN 0.2 03/08/2015 0740   NITRITE NEGATIVE 06/09/2022 1015   LEUKOCYTESUR LARGE (A) 06/09/2022 1015    Radiological Exams on Admission: DG Chest Port 1 View  Result Date: 06/09/2022 CLINICAL DATA:  Questionable sepsis - evaluate for abnormality EXAM: PORTABLE CHEST 1 VIEW COMPARISON:  Radiograph 04/14/2022 FINDINGS: Unchanged cardiomegaly. Left medial basilar opacity. Right lung is clear. No large pleural effusion. No evidence of pneumothorax. Thoracic spondylosis. No acute osseous abnormality. IMPRESSION: Left medial basilar opacity, could be atelectasis or developing infection. Electronically Signed   By: Caprice Renshaw M.D.   On: 06/09/2022 10:34    EKG: Independently reviewed.  Atrial fibrillation  Assessment/Plan Principal Problem:   Sepsis (HCC) Active Problems:   Chronic pain   DM type 2 (diabetes mellitus, type 2) (HCC)   Benign essential HTN   Acute metabolic encephalopathy   Atrial fibrillation, chronic (HCC)   UTI (urinary tract infection)   AKI  (acute kidney injury) (HCC)   Septic shock (HCC)   Hypothyroidism   Stage 3b chronic kidney disease (CKD) (HCC)   Hypokalemia   Hypothermia     Septic shock -Patient received 3 to 4 L of IV fluids in the emergency room, despite aggressive IV hydration she remained hypotensive -She is noted to be hypothermic, hypotensive, elevated lactate (3.0 on admission), acute kidney injury and mental status changes on admission -Blood cultures have been sent in the emergency room as have urine culture -She has been started on cefepime and vancomycin -Will start on stress dose steroids since she is on chronic prednisone therapy -Ordered peripheral Levophed -Continue IV hydration  Acute kidney injury on chronic kidney disease stage IIIb -Baseline creatinine appears to be around 1.4 -Admission creatinine noted to be 2.1 -Likely related to hypotension, volume depletion -Continue on IV fluids  Atrial fibrillation -Continue on apixaban -Holding metoprolol due to hypotension -Heart rate currently stable  Diabetes -Holding home dose of metformin as well as Jardiance -Started on sliding scale insulin  Acute metabolic encephalopathy -Likely related to hypotension and sepsis -Continue to monitor  Hypothyroidism -Continue Synthroid  Hypokalemia -Replace  Chronic pain -Holding home dose of opiates with altered mental status  Anxiety -Holding home dose of Klonopin with altered mental status  Goals of care -Palliative care consultation  DVT prophylaxis: Apixaban Code Status: DNR, confirmed with husband Family Communication: Updated husband over the phone Disposition Plan: Pending hospital course, may need to consider placement Consults called: Palliative care Admission status: ICU, inpatient  Erick Blinks MD [facility]   If 7PM-7AM, please contact night-coverage [email or web address]   06/09/2022, 1:18 PM

## 2022-06-09 NOTE — Sepsis Progress Note (Signed)
Elink following code sepsis °

## 2022-06-09 NOTE — Progress Notes (Signed)
Pharmacy Antibiotic Note  Megan [name] is a 71 y.o. female admitted on 06/09/2022 with  unknown source of infection .  Pharmacy has been consulted for vancomycin and cefepime dosing.  Plan: Vancomycin 1750 mg IV x 1 dose. Vancomycin 1000 mg IV every 48 hours. Cefepime 2000 mg IV every 12 hours. Monitor labs, c/s, and vanco level as indicated.     Temp (24hrs), Avg:95.7 F (35.4 C), Min:95.7 F (35.4 C), Max:95.7 F (35.4 C)  Recent Labs  Lab 06/09/22 1015 06/09/22 1020  WBC  --  6.0  CREATININE  --  2.14*  LATICACIDVEN 3.0*  --     CrCl cannot be calculated (Unknown ideal weight.).    Allergies  Allergen Reactions   Naproxen Other (See Comments)    Causes stomach burning    Antimicrobials this admission: Vanco 11/24 >> Cefepime 11/24 >>  Microbiology results: 11/24 BCx: pending 11/24 UCx: pending    Thank you for allowing pharmacy to be a part of this patient's care.  Judeth Cornfield, PharmD Clinical Pharmacist 06/09/2022 11:12 AM

## 2022-06-09 NOTE — ED Notes (Signed)
Date and time results received: 06/09/22 10:53 AM  (use smartphrase ".now" to insert current time)  Test: Lactic Acid  Critical Value: 3.0  Name of Provider Notified: Dr. Estell Harpin  Orders Received? Or Actions Taken?:  No new orders. Following sepsis protocol

## 2022-06-10 DIAGNOSIS — A419 Sepsis, unspecified organism: Secondary | ICD-10-CM | POA: Diagnosis not present

## 2022-06-10 DIAGNOSIS — G9341 Metabolic encephalopathy: Secondary | ICD-10-CM | POA: Diagnosis not present

## 2022-06-10 DIAGNOSIS — N179 Acute kidney failure, unspecified: Secondary | ICD-10-CM | POA: Diagnosis not present

## 2022-06-10 DIAGNOSIS — I482 Chronic atrial fibrillation, unspecified: Secondary | ICD-10-CM | POA: Diagnosis not present

## 2022-06-10 LAB — GLUCOSE, CAPILLARY
Glucose-Capillary: 117 mg/dL — ABNORMAL HIGH (ref 70–99)
Glucose-Capillary: 125 mg/dL — ABNORMAL HIGH (ref 70–99)
Glucose-Capillary: 95 mg/dL (ref 70–99)
Glucose-Capillary: 88 mg/dL (ref 70–99)
Glucose-Capillary: 220 mg/dL — ABNORMAL HIGH (ref 70–99)

## 2022-06-10 LAB — COMPREHENSIVE METABOLIC PANEL
ALT: 11 U/L (ref 0–44)
AST: 19 U/L (ref 15–41)
Albumin: 2.3 g/dL — ABNORMAL LOW (ref 3.5–5.0)
Alkaline Phosphatase: 99 U/L (ref 38–126)
Anion gap: 7 (ref 5–15)
BUN: 26 mg/dL — ABNORMAL HIGH (ref 8–23)
CO2: 19 mmol/L — ABNORMAL LOW (ref 22–32)
Calcium: 8 mg/dL — ABNORMAL LOW (ref 8.9–10.3)
Chloride: 116 mmol/L — ABNORMAL HIGH (ref 98–111)
Creatinine, Ser: 1.65 mg/dL — ABNORMAL HIGH (ref 0.44–1.00)
GFR, Estimated: 33 mL/min — ABNORMAL LOW (ref >60)
Glucose, Bld: 130 mg/dL — ABNORMAL HIGH (ref 70–99)
Potassium: 4.1 mmol/L (ref 3.5–5.1)
Sodium: 142 mmol/L (ref 135–145)
Total Bilirubin: 0.3 mg/dL (ref 0.3–1.2)
Total Protein: 5.3 g/dL — ABNORMAL LOW (ref 6.5–8.1)

## 2022-06-10 LAB — CBC
HCT: 33.5 % — ABNORMAL LOW (ref 36.0–46.0)
Hemoglobin: 10.6 g/dL — ABNORMAL LOW (ref 12.0–15.0)
MCH: 31.4 pg (ref 26.0–34.0)
MCHC: 31.6 g/dL (ref 30.0–36.0)
MCV: 99.1 fL (ref 80.0–100.0)
Platelets: 201 10*3/uL (ref 150–400)
RBC: 3.38 MIL/uL — ABNORMAL LOW (ref 3.87–5.11)
RDW: 14.4 % (ref 11.5–15.5)
WBC: 8.1 10*3/uL (ref 4.0–10.5)
nRBC: 0 % (ref 0.0–0.2)

## 2022-06-10 LAB — MRSA NEXT GEN BY PCR, NASAL: MRSA by PCR Next Gen: NOT DETECTED

## 2022-06-10 MED ORDER — OXYCODONE-ACETAMINOPHEN 5-325 MG PO TABS
1.0000 | ORAL_TABLET | Freq: Four times a day (QID) | ORAL | Status: DC | PRN
  Administered 2022-06-13: 1 via ORAL
  Filled 2022-06-10 (×2): qty 1

## 2022-06-10 MED ORDER — SODIUM CHLORIDE 0.9 % IV SOLN: 2.0000 g | Freq: Two times a day (BID) | INTRAVENOUS | Status: DC

## 2022-06-10 MED ORDER — HYDROCORTISONE SOD SUC (PF) 100 MG IJ SOLR
50.0000 mg | Freq: Three times a day (TID) | INTRAMUSCULAR | Status: DC
  Administered 2022-06-10 – 2022-06-11 (×3): 50 mg via INTRAVENOUS
  Filled 2022-06-10 (×3): qty 2

## 2022-06-10 MED ORDER — CHLORHEXIDINE GLUCONATE CLOTH 2 % EX PADS
6.0000 | MEDICATED_PAD | Freq: Every day | CUTANEOUS | Status: DC
  Administered 2022-06-10 – 2022-06-14 (×5): 6 via TOPICAL

## 2022-06-10 MED ORDER — SODIUM CHLORIDE 0.9 % IV SOLN
2.0000 g | Freq: Two times a day (BID) | INTRAVENOUS | Status: DC
  Administered 2022-06-10 – 2022-06-11 (×2): 2 g via INTRAVENOUS
  Filled 2022-06-10 (×2): qty 12.5

## 2022-06-10 MED ORDER — CLONAZEPAM 0.5 MG PO TABS
0.5000 mg | ORAL_TABLET | Freq: Two times a day (BID) | ORAL | Status: DC | PRN
  Administered 2022-06-11 – 2022-06-12 (×2): 0.5 mg via ORAL
  Filled 2022-06-10 (×2): qty 1

## 2022-06-10 MED ORDER — SODIUM CHLORIDE 0.45 % IV SOLN: INTRAVENOUS | Status: DC

## 2022-06-10 NOTE — ED Provider Notes (Signed)
[facility] Provider Note   CSN: [id number] Arrival date & time: 06/09/22  0940     History  Chief Complaint  Patient presents with   Altered Mental Status   Hypotension    Megan [name] is a 71 y.o. female.  Patient has a history of hyperlipidemia and chronic steroid use and diabetes.  She presents with weakness and lethargy  The history is provided by the patient, medical records and the EMS personnel. No language interpreter was used.  Altered Mental Status Presenting symptoms: behavior changes   Severity:  Mild Most recent episode:  Today Episode history:  Unable to specify Timing:  Constant Progression:  Waxing and waning Chronicity:  Recurrent Context: not alcohol use   Associated symptoms: no abdominal pain, no hallucinations, no headaches, no rash and no seizures        Home Medications Prior to Admission medications   Medication Sig Start Date End Date Taking? Authorizing Provider  apixaban (ELIQUIS) 5 MG TABS tablet Take 1 tablet (5 mg total) by mouth 2 (two) times daily. Restart on 7/14 Patient taking differently: Take 5 mg by mouth daily. 01/27/16  Yes Erick Blinks, MD  atorvastatin (LIPITOR) 80 MG tablet Take 80 mg by mouth daily. 05/18/22  Yes [provider]  enalapril (VASOTEC) 10 MG tablet Take 10 mg by mouth daily. 05/18/22  Yes [provider]  escitalopram (LEXAPRO) 20 MG tablet SMARTSIG:1 Tablet(s) By Mouth 05/18/22  Yes [provider]  JARDIANCE 10 MG TABS tablet Take 10 mg by mouth every morning. 03/22/22  Yes [provider]  lactulose (CHRONULAC) 10 GM/15ML solution Take 10 g by mouth 2 (two) times daily. 05/26/22  Yes [provider]  levothyroxine (SYNTHROID, LEVOTHROID) 25 MCG tablet Take 25 mcg by mouth daily. 07/14/15  Yes [provider]  metFORMIN (GLUCOPHAGE) 500 MG tablet Take 1,000 mg by mouth 2 (two) times daily. 03/22/22  Yes [provider]   metoprolol succinate (TOPROL-XL) 50 MG 24 hr tablet Take 1 tablet (50 mg total) by mouth daily with lunch. 12/22/17  Yes Johnson, Clanford L, MD  pantoprazole (PROTONIX) 40 MG tablet Take 40 mg by mouth 2 (two) times daily. 05/18/22  Yes [provider]  acetaminophen (TYLENOL) 325 MG tablet Take 2 tablets (650 mg total) by mouth every 6 (six) hours as needed for mild pain, moderate pain or headache. 11/19/17   Laural Benes, Clanford L, MD  Cholecalciferol (VITAMIN D-3 PO) Take 2,000 Units by mouth daily.     [provider]  clonazePAM (KLONOPIN) 1 MG tablet Take 1 mg by mouth 2 (two) times daily as needed for anxiety.    [provider]  Cyanocobalamin (B-12 PO) Take by mouth daily.    [provider]  fluticasone (FLONASE) 50 MCG/ACT nasal spray Place 2 sprays into both nostrils daily. 03/22/22   [provider]  furosemide (LASIX) 40 MG tablet Take 40-80 mg by mouth daily as needed. 03/22/22   [provider]  gabapentin (NEURONTIN) 100 MG capsule Take 100 mg by mouth 3 (three) times daily. 04/29/22   [provider]  ondansetron (ZOFRAN) 4 MG tablet 4 mg every 8 (eight) hours as needed for vomiting or nausea. 03/22/22   [provider]  oxyCODONE-acetaminophen (PERCOCET/ROXICET) 5-325 MG tablet Take 1 tablet by mouth every 6 (six) hours as needed for moderate pain. 03/08/22   [provider]  polyethylene glycol (MIRALAX) 17 g packet Take 17 g by mouth 2 (  two) times daily. 04/16/22   Shon Hale, MD  predniSONE (DELTASONE) 5 MG tablet Take 5 mg by mouth daily.    [provider]  senna-docusate (SENOKOT-S) 8.6-50 MG tablet Take 2 tablets by mouth at bedtime. 04/16/22   Shon Hale, MD  traZODone (DESYREL) 100 MG tablet Take 1 tablet (100 mg total) by mouth at bedtime. For sleep and Mood 04/16/22   Shon Hale, MD      Allergies    Naproxen    Review of Systems   Review of Systems  Constitutional:   Positive for fatigue. Negative for appetite change.  HENT:  Negative for congestion, ear discharge and sinus pressure.   Eyes:  Negative for discharge.  Respiratory:  Negative for cough.   Cardiovascular:  Negative for chest pain.  Gastrointestinal:  Negative for abdominal pain and diarrhea.  Genitourinary:  Negative for frequency and hematuria.  Musculoskeletal:  Negative for back pain.  Skin:  Negative for rash.  Neurological:  Negative for seizures and headaches.  Psychiatric/Behavioral:  Negative for hallucinations.     Physical Exam Updated Vital Signs BP 104/63   Pulse (!) 58   Temp 98.8 F (37.1 C) (Axillary)   Resp 14   Ht 5\' 2"  (1.575 m)   Wt 78.5 kg   SpO2 96%   BMI 31.65 kg/m  Physical Exam Vitals and nursing note reviewed.  Constitutional:      Appearance: She is well-developed.     Comments: Lethargic  HENT:     Head: Normocephalic.     Nose: Nose normal.  Eyes:     General: No scleral icterus.    Conjunctiva/sclera: Conjunctivae normal.  Neck:     Thyroid: No thyromegaly.  Cardiovascular:     Rate and Rhythm: Normal rate and regular rhythm.     Heart sounds: No murmur heard.    No friction rub. No gallop.  Pulmonary:     Breath sounds: No stridor. No wheezing or rales.  Chest:     Chest wall: No tenderness.  Abdominal:     General: There is no distension.     Tenderness: There is no abdominal tenderness. There is no rebound.  Musculoskeletal:        General: Normal range of motion.     Cervical back: Neck supple.  Lymphadenopathy:     Cervical: No cervical adenopathy.  Skin:    Findings: No erythema or rash.  Neurological:     Mental Status: She is oriented to person, place, and time.     Motor: No abnormal muscle tone.     Coordination: Coordination normal.  Psychiatric:        Behavior: Behavior normal.     ED Results / Procedures / Treatments   Labs (all labs ordered are listed, but only abnormal results are displayed) Labs Reviewed   LACTIC ACID, PLASMA - Abnormal; Notable for the following components:      Result Value   Lactic Acid, Venous 3.0 (*)    All other components within normal limits  LACTIC ACID, PLASMA - Abnormal; Notable for the following components:   Lactic Acid, Venous 3.2 (*)    All other components within normal limits  COMPREHENSIVE METABOLIC PANEL - Abnormal; Notable for the following components:   Potassium 3.2 (*)    Glucose, Bld 112 (*)    BUN 31 (*)    Creatinine, Ser 2.14 (*)    Calcium 8.6 (*)    Total Protein 5.9 (*)  Albumin 2.6 (*)    Alkaline Phosphatase 128 (*)    GFR, Estimated 24 (*)    All other components within normal limits  CBC WITH DIFFERENTIAL/PLATELET - Abnormal; Notable for the following components:   RBC 3.67 (*)    Hemoglobin 11.6 (*)    All other components within normal limits  URINALYSIS, ROUTINE W REFLEX MICROSCOPIC - Abnormal; Notable for the following components:   APPearance CLOUDY (*)    Glucose, UA >=500 (*)    Protein, ur 30 (*)    Leukocytes,Ua LARGE (*)    WBC, UA >50 (*)    Bacteria, UA RARE (*)    Non Squamous Epithelial 0-5 (*)    All other components within normal limits  COMPREHENSIVE METABOLIC PANEL - Abnormal; Notable for the following components:   Chloride 116 (*)    CO2 19 (*)    Glucose, Bld 130 (*)    BUN 26 (*)    Creatinine, Ser 1.65 (*)    Calcium 8.0 (*)    Total Protein 5.3 (*)    Albumin 2.3 (*)    GFR, Estimated 33 (*)    All other components within normal limits  CBC - Abnormal; Notable for the following components:   RBC 3.38 (*)    Hemoglobin 10.6 (*)    HCT 33.5 (*)    All other components within normal limits  GLUCOSE, CAPILLARY - Abnormal; Notable for the following components:   Glucose-Capillary 123 (*)    All other components within normal limits  GLUCOSE, CAPILLARY - Abnormal; Notable for the following components:   Glucose-Capillary 142 (*)    All other components within normal limits  GLUCOSE, CAPILLARY -  Abnormal; Notable for the following components:   Glucose-Capillary 117 (*)    All other components within normal limits  GLUCOSE, CAPILLARY - Abnormal; Notable for the following components:   Glucose-Capillary 125 (*)    All other components within normal limits  RESP PANEL BY RT-PCR (FLU A&B, COVID) ARPGX2  CULTURE, BLOOD (ROUTINE X 2)  CULTURE, BLOOD (ROUTINE X 2)  URINE CULTURE  MRSA NEXT GEN BY PCR, NASAL  PROTIME-INR  APTT  CORTISOL  LACTIC ACID, PLASMA  LACTIC ACID, PLASMA  GLUCOSE, CAPILLARY  POC OCCULT BLOOD, ED  TYPE AND SCREEN  TROPONIN I (HIGH SENSITIVITY)  TROPONIN I (HIGH SENSITIVITY)    EKG None  Radiology DG Chest Port 1 View  Result Date: 06/09/2022 CLINICAL DATA:  Questionable sepsis - evaluate for abnormality EXAM: PORTABLE CHEST 1 VIEW COMPARISON:  Radiograph 04/14/2022 FINDINGS: Unchanged cardiomegaly. Left medial basilar opacity. Right lung is clear. No large pleural effusion. No evidence of pneumothorax. Thoracic spondylosis. No acute osseous abnormality. IMPRESSION: Left medial basilar opacity, could be atelectasis or developing infection. Electronically Signed   By: Caprice Renshaw M.D.   On: 06/09/2022 10:34    Procedures Procedures    Medications Ordered in ED Medications  vancomycin (VANCOCIN) IVPB 1000 mg/200 mL premix (has no administration in time range)  hydrocortisone sodium succinate (SOLU-CORTEF) 100 MG injection 100 mg (100 mg Intravenous Given 06/10/22 0443)  0.9 %  sodium chloride infusion ( Intravenous Infusion Verify 06/09/22 1900)  norepinephrine (LEVOPHED) 4mg  in (0.016 mg/mL) premix infusion (2 mcg/min Intravenous Infusion Verify 06/09/22 1900)  atorvastatin (LIPITOR) tablet 80 mg (has no administration in time range)  levothyroxine (SYNTHROID) tablet 25 mcg (25 mcg Oral Given 06/10/22 0543)  lactulose (CHRONULAC) 10 GM/15ML solution 10 g (10 g Oral Given 06/09/22 1726)  pantoprazole (PROTONIX)  EC tablet 40 mg (40 mg Oral Given  06/09/22 1726)  polyethylene glycol (MIRALAX / GLYCOLAX) packet 17 g (17 g Oral Given 06/09/22 1729)  apixaban (ELIQUIS) tablet 5 mg (5 mg Oral Given 06/09/22 1726)  insulin aspart (novoLOG) injection 0-15 Units ( Subcutaneous Not Given 06/10/22 0440)  0.9 % NaCl with KCl 40 mEq / L  infusion ( Intravenous Infusion Verify 06/09/22 1900)  acetaminophen (TYLENOL) tablet 650 mg (has no administration in time range)    Or  acetaminophen (TYLENOL) suppository 650 mg (has no administration in time range)  ondansetron (ZOFRAN) tablet 4 mg (has no administration in time range)    Or  ondansetron (ZOFRAN) injection 4 mg (has no administration in time range)  albuterol (PROVENTIL) (2.5 MG/3ML) 0.083% nebulizer solution 2.5 mg (has no administration in time range)  ceFEPIme (MAXIPIME) 2 g in sodium chloride 0.9 % 100 mL IVPB (2 g Intravenous New Bag/Given 06/09/22 1939)  Chlorhexidine Gluconate Cloth 2 % PADS 6 each (has no administration in time range)  ceFEPIme (MAXIPIME) 2 g in sodium chloride 0.9 % 100 mL IVPB (0 g Intravenous Stopped 06/09/22 1115)  metroNIDAZOLE (FLAGYL) IVPB 500 mg (0 mg Intravenous Stopped 06/09/22 1229)  sodium chloride 0.9 % bolus 3,000 mL (0 mLs Intravenous Stopped 06/09/22 1157)  vancomycin (VANCOREADY) IVPB 1750 mg/350 mL (0 mg Intravenous Stopped 06/09/22 1513)  naloxone (NARCAN) injection 1 mg (1 mg Intravenous Given 06/09/22 1130)  sodium chloride 0.9 % bolus 1,000 mL (0 mLs Intravenous Stopped 06/09/22 1229)    ED Course/ Medical Decision Making/ A&P  CRITICAL CARE Performed by: Bethann BerkshireJoseph Jaiceon Collister Total critical care time: 45 minutes Critical care time was exclusive of separately billable procedures and treating other patients. Critical care was necessary to treat or prevent imminent or life-threatening deterioration. Critical care was time spent personally by me on the following activities: development of treatment plan with patient and/or surrogate as well as nursing,  discussions with consultants, evaluation of patient's response to treatment, examination of patient, obtaining history from patient or surrogate, ordering and performing treatments and interventions, ordering and review of laboratory studies, ordering and review of radiographic studies, pulse oximetry and re-evaluation of patient's condition.     Patient was septic when she arrived.  Sepsis protocol was started.  She ended up receiving 4 L of normal saline and her blood pastor responded to a systolic over 100 and a MAP over 70.  She was admitted to medicine to ICU for sepsis                          Medical Decision Making Amount and/or Complexity of Data Reviewed Labs: ordered. Radiology: ordered.  Risk Prescription drug management. Decision regarding hospitalization.  This patient presents to the ED for concern of lethargy and weakness, this involves an extensive number of treatment options, and is a complaint that carries with it a high risk of complications and morbidity.  The differential diagnosis includes sepsis stroke   Co morbidities that complicate the patient evaluation  Chronic steroid use, diabetes   Additional history obtained:  Additional history obtained from patient External records from outside source obtained and reviewed including hospital records   Lab Tests:  I Ordered, and personally interpreted labs.  The pertinent results include: Chemistry showed patient to have AKI, hemoglobin 1.6, lactate 3.0, urinalysis appears infected   Imaging Studies ordered:  I ordered imaging studies including chest x-ray I independently visualized and interpreted imaging which showed  left basilar opacity I agree with the radiologist interpretation   Cardiac Monitoring: / EKG:  The patient was maintained on a cardiac monitor.  I personally viewed and interpreted the cardiac monitored which showed an underlying rhythm of: Normal sinus rhythm   Consultations  Obtained:  I requested consultation with the hospitalist,  and discussed lab and imaging findings as well as pertinent plan - they recommend: Admit and treat sepsis   Problem List / ED Course / Critical interventions / Medication management  Diabetes and sepsis I ordered medication including a box with sepsis and normal saline Reevaluation of the patient after these medicines showed that the patient improved I have reviewed the patients home medicines and have made adjustments as needed   Social Determinants of Health:  None   Test / Admission - Considered:  None  Patient with sepsis and hypotension.  Possible urinary tract infection and or respiratory infection        Final Clinical Impression(s) / ED Diagnoses Final diagnoses:  None    Rx / DC Orders ED Discharge Orders     None         [name], MD 06/10/22 [phone]

## 2022-06-10 NOTE — Progress Notes (Signed)
PROGRESS NOTE    Megan [name]  VQQ:[id number] DOB: 1951/02/17 DOA: 06/09/2022 PCP: [name], MD    Brief Narrative:  71 year old female admitted to the hospital with increasing lethargy.  Found to be hypothermic, hypotensive on arrival.  Possible UTI, dehydration and AKI.  Admitted for sepsis with septic shock.  Aggressively hydrated and required vasopressor support on admission.  She is on IV antibiotics.  Appears to be clinically improving.   Assessment & Plan:   Principal Problem:   Sepsis (HCC) Active Problems:   Chronic pain   DM type 2 (diabetes mellitus, type 2) (HCC)   Benign essential HTN   Acute metabolic encephalopathy   Atrial fibrillation, chronic (HCC)   UTI (urinary tract infection)   AKI (acute kidney injury) (HCC)   Septic shock (HCC)   Hypothyroidism   Stage 3b chronic kidney disease (CKD) (HCC)   Hypokalemia   Hypothermia    Septic shock -Patient received 3 to 4 L of IV fluids in the emergency room, despite aggressive IV hydration she remained hypotensive -She was hypothermic, hypotensive, elevated lactate (3.0 on admission), acute kidney injury and mental status changes on admission -Blood cultures have been sent in the emergency room as have urine culture -She has been started on cefepime and vancomycin -Started on stress dose steroids since she is on chronic prednisone therapy, will start to taper -Briefly quired Levophed, discontinued 11/25 AM -Lactic acid has now normalized -Continue IV hydration   Acute kidney injury on chronic kidney disease stage IIIb -Baseline creatinine appears to be around 1.4 -Admission creatinine noted to be 2.1 -Likely related to hypotension, volume depletion -Improving with IV fluids, current creatinine 1.6 -Continue to hold home dose of enalapril and Lasix   Atrial fibrillation -Continue on apixaban -Holding metoprolol due to hypotension -Heart rate currently stable   Diabetes -Holding home dose  of metformin as well as Jardiance -Started on sliding scale insulin -Blood sugars currently stable   Acute metabolic encephalopathy -Likely related to hypotension and sepsis -Overall mental status appears to be back to baseline -Continue to monitor   Hypothyroidism -Continue Synthroid   Hypokalemia -Replaced   Chronic pain -Resume low-dose opiates for pain now that mental status has improved   Anxiety -Resume reduced dose of Klonopin so as to not precipitate withdrawal  Generalized weakness Recurrent falls -PT/OT   Goals of care -Palliative care consultation   DVT prophylaxis:  apixaban (ELIQUIS) tablet 5 mg  Code Status: DNR Family Communication: Updated husband at the bedside Disposition Plan: Status is: Inpatient Remains inpatient appropriate because: Continued management of UTI/sepsis     Consultants:    Procedures:    Antimicrobials:  Cefepime 11/24 > Vancomycin 11/24 >   Subjective: She is feeling better today.  Denies any shortness of breath or cough.  She is more awake and alert today.  Still feels cold.  Objective: Vitals:   06/09/22 2302 06/10/22 0900 06/10/22 1000 06/10/22 1116  BP:  109/68 126/73   Pulse:  61 62 60  Resp:  (!) 21 15 20   Temp: 98.8 F (37.1 C) 98.2 F (36.8 C)  98.8 F (37.1 C)  TempSrc: Axillary   Core  SpO2:  98% 100% 100%  Weight:      Height:        Intake/Output Summary (Last 24 hours) at 06/10/2022 1131 Last data filed at 06/10/2022 0955 Gross per 24 hour  Intake 2987.73 ml  Output 650 ml  Net 2337.73 ml   06/12/2022  06/09/22 2058  Weight: 78.5 kg    Examination:  General exam: Appears calm and comfortable  Respiratory system: Clear to auscultation. Respiratory effort normal. Cardiovascular system: S1 & S2 heard, RRR. No JVD, murmurs, rubs, gallops or clicks. No pedal edema. Gastrointestinal system: Abdomen is nondistended, soft and nontender. No organomegaly or masses felt. Normal bowel  sounds heard. Central nervous system: Alert and oriented. No focal neurological deficits. Extremities: Symmetric 5 x 5 power. Skin: No rashes, lesions or ulcers Psychiatry: Judgement and insight appear normal. Mood & affect appropriate.     Data Reviewed: I have personally reviewed following labs and imaging studies  CBC: Recent Labs  Lab 06/09/22 1020 06/10/22 0319  WBC 6.0 8.1  NEUTROABS 3.7  --   HGB 11.6* 10.6*  HCT 36.2 33.5*  MCV 98.6 99.1  PLT 173 201   Basic Metabolic Panel: Recent Labs  Lab 06/09/22 1020 06/10/22 0319  NA 140 142  K 3.2* 4.1  CL 102 116*  CO2 25 19*  GLUCOSE 112* 130*  BUN 31* 26*  CREATININE 2.14* 1.65*  CALCIUM 8.6* 8.0*   GFR: Estimated Creatinine Clearance: 29.9 mL/min (A) (by C-G formula based on SCr of 1.65 mg/dL (H)). Liver Function Tests: Recent Labs  Lab 06/09/22 1020 06/10/22 0319  AST 22 19  ALT 11 11  ALKPHOS 128* 99  BILITOT 0.4 0.3  PROT 5.9* 5.3*  ALBUMIN 2.6* 2.3*   No results for input(s): "LIPASE", "AMYLASE" in the last 168 hours. No results for input(s): "AMMONIA" in the last 168 hours. Coagulation Profile: Recent Labs  Lab 06/09/22 1020  INR 1.2   Cardiac Enzymes: No results for input(s): "CKTOTAL", "CKMB", "CKMBINDEX", "TROPONINI" in the last 168 hours. BNP (last 3 results) No results for input(s): "PROBNP" in the last 8760 hours. HbA1C: No results for input(s): "HGBA1C" in the last 72 hours. CBG: Recent Labs  Lab 06/09/22 1647 06/09/22 1924 06/09/22 2311 06/10/22 0344 06/10/22 0732  GLUCAP 93 123* 142* 117* 125*   Lipid Profile: No results for input(s): "CHOL", "HDL", "LDLCALC", "TRIG", "CHOLHDL", "LDLDIRECT" in the last 72 hours. Thyroid Function Tests: No results for input(s): "TSH", "T4TOTAL", "FREET4", "T3FREE", "THYROIDAB" in the last 72 hours. Anemia Panel: No results for input(s): "VITAMINB12", "FOLATE", "FERRITIN", "TIBC", "IRON", "RETICCTPCT" in the last 72 hours. Sepsis  Labs: Recent Labs  Lab 06/09/22 1015 06/09/22 1219 06/09/22 1457 06/09/22 1752  LATICACIDVEN 3.0* 3.2* 1.6 1.3    Recent Results (from the past 240 hour(s))  Resp Panel by RT-PCR (Flu A&B, Covid) Anterior Nasal Swab     Status: None   Collection Time: 06/09/22 10:15 AM   Specimen: Anterior Nasal Swab  Result Value Ref Range Status   SARS Coronavirus 2 by RT PCR NEGATIVE NEGATIVE Final    Comment: (NOTE) SARS-CoV-2 target nucleic acids are NOT DETECTED.  The SARS-CoV-2 RNA is generally detectable in upper respiratory specimens during the acute phase of infection. The lowest concentration of SARS-CoV-2 viral copies this assay can detect is 138 copies/mL. A negative result does not preclude SARS-Cov-2 infection and should not be used as the sole basis for treatment or other patient management decisions. A negative result may occur with  improper specimen collection/handling, submission of specimen other than nasopharyngeal swab, presence of viral mutation(s) within the areas targeted by this assay, and inadequate number of viral copies(<138 copies/mL). A negative result must be combined with clinical observations, patient history, and epidemiological information. The expected result is Negative.  Fact Sheet for Patients:  BloggerCourse.com  Fact Sheet for Healthcare Providers:  SeriousBroker.ithttps://www.fda.gov/media/152162/download  This test is no t yet approved or cleared by the [place] FDA and  has been authorized for detection and/or diagnosis of SARS-CoV-2 by FDA under an Emergency Use Authorization (EUA). This EUA will remain  in effect (meaning this test can be used) for the duration of the COVID-19 declaration under Section 564(b)(1) of the Act, 21 U.S.C.section 360bbb-3(b)(1), unless the authorization is terminated  or revoked sooner.       Influenza A by PCR NEGATIVE NEGATIVE Final   Influenza B by PCR NEGATIVE NEGATIVE Final    Comment:  (NOTE) The Xpert Xpress SARS-CoV-2/FLU/RSV plus assay is intended as an aid in the diagnosis of influenza from Nasopharyngeal swab specimens and should not be used as a sole basis for treatment. Nasal washings and aspirates are unacceptable for Xpert Xpress SARS-CoV-2/FLU/RSV testing.  Fact Sheet for Patients: BloggerCourse.comhttps://www.fda.gov/media/152166/download  Fact Sheet for Healthcare Providers: SeriousBroker.ithttps://www.fda.gov/media/152162/download  This test is not yet approved or cleared by the [place] FDA and has been authorized for detection and/or diagnosis of SARS-CoV-2 by FDA under an Emergency Use Authorization (EUA). This EUA will remain in effect (meaning this test can be used) for the duration of the COVID-19 declaration under Section 564(b)(1) of the Act, 21 U.S.C. section 360bbb-3(b)(1), unless the authorization is terminated or revoked.  Performed at [facility], [address] [place], [place] [address]   Blood Culture (routine x 2)     Status: None (Preliminary result)   Collection Time: 06/09/22 10:20 AM   Specimen: BLOOD  Result Value Ref Range Status   Specimen Description BLOOD  Final   Special Requests RIGHT ANTECUBITAL Blood Culture adequate volume  Final   Culture   Final    NO GROWTH < 24 HOURS Performed at [facility], [address] [place], [place] [address]    Report Status PENDING  Incomplete  Blood Culture (routine x 2)     Status: None (Preliminary result)   Collection Time: 06/09/22 10:38 AM   Specimen: BLOOD  Result Value Ref Range Status   Specimen Description BLOOD BOTTLES DRAWN AEROBIC ONLY  Final   Special Requests RIGHT ANTECUBITAL Blood Culture adequate volume  Final   Culture   Final    NO GROWTH < 24 HOURS Performed at [facility], [address] [place], [place] [address]    Report Status PENDING  Incomplete  MRSA Next Gen by PCR, Nasal     Status: None   Collection Time: 06/09/22  3:31 PM   Specimen: Nasal Mucosa; Nasal Swab  Result  Value Ref Range Status   MRSA by PCR Next Gen NOT DETECTED NOT DETECTED Final    Comment: (NOTE) The GeneXpert MRSA Assay (FDA approved for NASAL specimens only), is one component of a comprehensive MRSA colonization surveillance program. It is not intended to diagnose MRSA infection nor to guide or monitor treatment for MRSA infections. Test performance is not FDA approved in patients less than 736 years old. Performed at [facility], [address] [place], [place] [address]          [facility]  Result Date: 06/09/2022 CLINICAL DATA:  Questionable sepsis - evaluate for abnormality EXAM: PORTABLE CHEST 1 VIEW COMPARISON:  Radiograph 04/14/2022 FINDINGS: Unchanged cardiomegaly. Left medial basilar opacity. Right lung is clear. No large pleural effusion. No evidence of pneumothorax. Thoracic spondylosis. No acute osseous abnormality. IMPRESSION: Left medial basilar opacity, could be atelectasis or developing infection.  Electronically Signed   By: Caprice Renshaw M.D.   On: 06/09/2022 10:34        Scheduled Meds:  apixaban  5 mg Oral BID   atorvastatin  80 mg Oral Daily   Chlorhexidine Gluconate Cloth  6 each Topical Daily   hydrocortisone sod succinate (SOLU-CORTEF) inj  100 mg Intravenous Q8H   insulin aspart  0-15 Units Subcutaneous Q4H   lactulose  10 g Oral BID   levothyroxine  25 mcg Oral Daily   pantoprazole  40 mg Oral BID   polyethylene glycol  17 g Oral BID   Continuous Infusions:  sodium chloride 20 mL/hr at 06/09/22 1900   0.9 % NaCl with KCl 40 mEq / L 100 mL/hr at 06/10/22 0955   ceFEPime (MAXIPIME) IV     norepinephrine (LEVOPHED) Adult infusion 2 mcg/min (06/10/22 0318)   [START ON 06/11/2022] vancomycin       LOS: 1 day    Time spent:    Erick Blinks, MD [facility]   If 7PM-7AM, please contact night-coverage [email or web address]  06/10/2022, 11:31 AM

## 2022-06-11 DIAGNOSIS — I482 Chronic atrial fibrillation, unspecified: Secondary | ICD-10-CM | POA: Diagnosis not present

## 2022-06-11 DIAGNOSIS — A419 Sepsis, unspecified organism: Secondary | ICD-10-CM | POA: Diagnosis not present

## 2022-06-11 DIAGNOSIS — G9341 Metabolic encephalopathy: Secondary | ICD-10-CM | POA: Diagnosis not present

## 2022-06-11 DIAGNOSIS — N179 Acute kidney failure, unspecified: Secondary | ICD-10-CM | POA: Diagnosis not present

## 2022-06-11 LAB — GLUCOSE, CAPILLARY
Glucose-Capillary: 111 mg/dL — ABNORMAL HIGH (ref 70–99)
Glucose-Capillary: 124 mg/dL — ABNORMAL HIGH (ref 70–99)
Glucose-Capillary: 128 mg/dL — ABNORMAL HIGH (ref 70–99)
Glucose-Capillary: 170 mg/dL — ABNORMAL HIGH (ref 70–99)
Glucose-Capillary: 179 mg/dL — ABNORMAL HIGH (ref 70–99)
Glucose-Capillary: 221 mg/dL — ABNORMAL HIGH (ref 70–99)
Glucose-Capillary: 59 mg/dL — ABNORMAL LOW (ref 70–99)
Glucose-Capillary: 84 mg/dL (ref 70–99)

## 2022-06-11 LAB — RENAL FUNCTION PANEL
Albumin: 2.7 g/dL — ABNORMAL LOW (ref 3.5–5.0)
Anion gap: 8 (ref 5–15)
BUN: 28 mg/dL — ABNORMAL HIGH (ref 8–23)
CO2: 19 mmol/L — ABNORMAL LOW (ref 22–32)
Calcium: 8.8 mg/dL — ABNORMAL LOW (ref 8.9–10.3)
Chloride: 108 mmol/L (ref 98–111)
Creatinine, Ser: 1.53 mg/dL — ABNORMAL HIGH (ref 0.44–1.00)
GFR, Estimated: 36 mL/min — ABNORMAL LOW (ref >60)
Glucose, Bld: 60 mg/dL — ABNORMAL LOW (ref 70–99)
Phosphorus: 2.5 mg/dL (ref 2.5–4.6)
Potassium: 4.3 mmol/L (ref 3.5–5.1)
Sodium: 135 mmol/L (ref 135–145)

## 2022-06-11 LAB — URINE CULTURE: Culture: NO GROWTH

## 2022-06-11 MED ORDER — MELATONIN 3 MG PO TABS
6.0000 mg | ORAL_TABLET | Freq: Every day | ORAL | Status: DC
  Administered 2022-06-11 – 2022-06-13 (×3): 6 mg via ORAL
  Filled 2022-06-11 (×3): qty 2

## 2022-06-11 MED ORDER — INSULIN ASPART 100 UNIT/ML IJ SOLN
0.0000 [IU] | Freq: Three times a day (TID) | INTRAMUSCULAR | Status: DC
  Administered 2022-06-11: 1 [IU] via SUBCUTANEOUS
  Administered 2022-06-12: 2 [IU] via SUBCUTANEOUS
  Administered 2022-06-12: 1 [IU] via SUBCUTANEOUS
  Administered 2022-06-12 – 2022-06-13 (×2): 2 [IU] via SUBCUTANEOUS
  Administered 2022-06-13: 3 [IU] via SUBCUTANEOUS
  Administered 2022-06-13: 5 [IU] via SUBCUTANEOUS
  Administered 2022-06-14: 1 [IU] via SUBCUTANEOUS

## 2022-06-11 MED ORDER — PREDNISONE 20 MG PO TABS
40.0000 mg | ORAL_TABLET | Freq: Every day | ORAL | Status: DC
  Administered 2022-06-12 – 2022-06-13 (×2): 40 mg via ORAL
  Filled 2022-06-11 (×2): qty 2

## 2022-06-11 MED ORDER — POLYETHYLENE GLYCOL 3350 17 G PO PACK
17.0000 g | PACK | Freq: Every day | ORAL | Status: DC
  Administered 2022-06-12 – 2022-06-13 (×2): 17 g via ORAL
  Filled 2022-06-11 (×3): qty 1

## 2022-06-11 MED ORDER — INSULIN ASPART 100 UNIT/ML IJ SOLN
0.0000 [IU] | Freq: Every day | INTRAMUSCULAR | Status: DC
  Administered 2022-06-11 – 2022-06-13 (×2): 2 [IU] via SUBCUTANEOUS

## 2022-06-11 MED ORDER — LACTULOSE 10 GM/15ML PO SOLN: 10.0000 g | Freq: Two times a day (BID) | ORAL | Status: DC | PRN

## 2022-06-11 MED ORDER — HYDROCORTISONE SOD SUC (PF) 100 MG IJ SOLR
50.0000 mg | Freq: Two times a day (BID) | INTRAMUSCULAR | Status: AC
  Administered 2022-06-11: 50 mg via INTRAVENOUS
  Filled 2022-06-11: qty 2

## 2022-06-11 NOTE — Progress Notes (Signed)
PROGRESS NOTE    Casimer LeekVirginia A Pam  ZOX:[id number] DOB: 10/06/1950 DOA: 06/09/2022 PCP: Gareth MorganKnowlton, Steve, MD    Brief Narrative:  71 year old female admitted to the hospital with increasing lethargy.  Found to be hypothermic, hypotensive on arrival.  Possible UTI, dehydration and AKI.  Admitted for sepsis with septic shock.  Aggressively hydrated and required vasopressor support on admission.  She is on IV antibiotics.  Appears to be clinically improving.   Assessment & Plan:   Principal Problem:   Sepsis (HCC) Active Problems:   Chronic pain   DM type 2 (diabetes mellitus, type 2) (HCC)   Benign essential HTN   Acute metabolic encephalopathy   Atrial fibrillation, chronic (HCC)   UTI (urinary tract infection)   AKI (acute kidney injury) (HCC)   Septic shock (HCC)   Hypothyroidism   Stage 3b chronic kidney disease (CKD) (HCC)   Hypokalemia   Hypothermia    Septic shock -Patient received 3 to 4 L of IV fluids in the emergency room, despite aggressive IV hydration she remained hypotensive -She was hypothermic, hypotensive, elevated lactate (3.0 on admission), acute kidney injury and mental status changes on admission -Blood cultures and urine cultures have shown no growth -She has been started on cefepime and vancomycin -Started on stress dose steroids since she is on chronic prednisone therapy, continue to taper hydrocortisone.  Will likely transition to prednisone in a.m. -Briefly required Levophed, discontinued 11/25 AM -Lactic acid has now normalized -Continue IV hydration -She reportedly required to go back on Levophed overnight, although I was not able to locate where significant/persistent hypotension was documented. -Blood pressures are better today and she is currently off Levophed. -Since cultures have shown no growth, she does not appear toxic and is clinically improving, will discontinue further antibiotics for now continue observation   Acute kidney injury on  chronic kidney disease stage IIIb -Baseline creatinine appears to be around 1.4 -Admission creatinine noted to be 2.1 -Likely related to hypotension, volume depletion -Improving with IV fluids, current creatinine 1.5 -Continue to hold home dose of enalapril and Lasix   Atrial fibrillation -Continue on apixaban -Holding metoprolol due to hypotension -Heart rate currently stable   Diabetes -Holding home dose of metformin as well as Jardiance -Started on sliding scale insulin -She has had some hypoglycemic episodes on morning labs, but likely because she was continued on every 4 hours SSI -Will change SSI to before meals and at bedtime   Acute metabolic encephalopathy -Likely related to hypotension and sepsis -Overall mental status appears to be back to baseline -Continue to monitor   Hypothyroidism -Continue Synthroid -Check TSH since she is repeatedly complaining of feeling cold   Hypokalemia -Replaced   Chronic pain -Resume low-dose opiates for pain now that mental status has improved   Anxiety -Resume reduced dose of Klonopin so as to not precipitate withdrawal  Generalized weakness Recurrent falls -PT/OT   Goals of care -Patient agrees to DNR status, but wishes to continue with all other available treatments -Further disposition will be pending PT eval   DVT prophylaxis:  apixaban (ELIQUIS) tablet 5 mg  Code Status: DNR Family Communication: Updated husband at the bedside Disposition Plan: Status is: Inpatient Remains inpatient appropriate because: Continued management of UTI/sepsis     Consultants:    Procedures:    Antimicrobials:  Cefepime 11/24 >11/26 Vancomycin 11/24 >11/26   Subjective: She continues to complain of feeling cold.  She has not had any vomiting.  She denies any shortness of breath or  cough.  She is currently sitting up in the chair.  Objective: Vitals:   06/11/22 0742 06/11/22 1016 06/11/22 1034 06/11/22 1100  BP: 127/74  (!) 148/91  (!) 152/67  Pulse: 89 (!) 118 92 72  Resp: (!) 23 (!) 23 (!) 21 (!) 24  Temp: 98.5 F (36.9 C)     TempSrc: Axillary     SpO2: 100% 100% 100% 98%  Weight:      Height:        Intake/Output Summary (Last 24 hours) at 06/11/2022 1130 Last data filed at 06/11/2022 1036 Gross per 24 hour  Intake 1918.68 ml  Output 1000 ml  Net 918.68 ml   Filed Weights   06/09/22 2058  Weight: 78.5 kg    Examination:  General exam: Appears calm and comfortable  Respiratory system: Clear to auscultation. Respiratory effort normal. Cardiovascular system: S1 & S2 heard, RRR. No JVD, murmurs, rubs, gallops or clicks. No pedal edema. Gastrointestinal system: Abdomen is nondistended, soft and nontender. No organomegaly or masses felt. Normal bowel sounds heard. Central nervous system: Alert and oriented. No focal neurological deficits. Extremities: Symmetric 5 x 5 power. Skin: No rashes, lesions or ulcers Psychiatry: Judgement and insight appear normal. Mood & affect appropriate.     Data Reviewed: I have personally reviewed following labs and imaging studies  CBC: Recent Labs  Lab 06/09/22 1020 06/10/22 0319  WBC 6.0 8.1  NEUTROABS 3.7  --   HGB 11.6* 10.6*  HCT 36.2 33.5*  MCV 98.6 99.1  PLT 173 201   Basic Metabolic Panel: Recent Labs  Lab 06/09/22 1020 06/10/22 0319 06/11/22 0602  NA 140 142 135  K 3.2* 4.1 4.3  CL 102 116* 108  CO2 25 19* 19*  GLUCOSE 112* 130* 60*  BUN 31* 26* 28*  CREATININE 2.14* 1.65* 1.53*  CALCIUM 8.6* 8.0* 8.8*  PHOS  --   --  2.5   GFR: Estimated Creatinine Clearance: 32.3 mL/min (A) (by C-G formula based on SCr of 1.53 mg/dL (H)). Liver Function Tests: Recent Labs  Lab 06/09/22 1020 06/10/22 0319 06/11/22 0602  AST 22 19  --   ALT 11 11  --   ALKPHOS 128* 99  --   BILITOT 0.4 0.3  --   PROT 5.9* 5.3*  --   ALBUMIN 2.6* 2.3* 2.7*   No results for input(s): "LIPASE", "AMYLASE" in the last 168 hours. No results for  input(s): "AMMONIA" in the last 168 hours. Coagulation Profile: Recent Labs  Lab 06/09/22 1020  INR 1.2   Cardiac Enzymes: No results for input(s): "CKTOTAL", "CKMB", "CKMBINDEX", "TROPONINI" in the last 168 hours. BNP (last 3 results) No results for input(s): "PROBNP" in the last 8760 hours. HbA1C: No results for input(s): "HGBA1C" in the last 72 hours. CBG: Recent Labs  Lab 06/11/22 0010 06/11/22 0343 06/11/22 0730 06/11/22 0751 06/11/22 1103  GLUCAP 179* 124* 59* 84 111*   Lipid Profile: No results for input(s): "CHOL", "HDL", "LDLCALC", "TRIG", "CHOLHDL", "LDLDIRECT" in the last 72 hours. Thyroid Function Tests: No results for input(s): "TSH", "T4TOTAL", "FREET4", "T3FREE", "THYROIDAB" in the last 72 hours. Anemia Panel: No results for input(s): "VITAMINB12", "FOLATE", "FERRITIN", "TIBC", "IRON", "RETICCTPCT" in the last 72 hours. Sepsis Labs: Recent Labs  Lab 06/09/22 1015 06/09/22 1219 06/09/22 1457 06/09/22 1752  LATICACIDVEN 3.0* 3.2* 1.6 1.3    Recent Results (from the past 240 hour(s))  Resp Panel by RT-PCR (Flu A&B, Covid) Anterior Nasal Swab     Status: None  Collection Time: 06/09/22 10:15 AM   Specimen: Anterior Nasal Swab  Result Value Ref Range Status   SARS Coronavirus 2 by RT PCR NEGATIVE NEGATIVE Final    Comment: (NOTE) SARS-CoV-2 target nucleic acids are NOT DETECTED.  The SARS-CoV-2 RNA is generally detectable in upper respiratory specimens during the acute phase of infection. The lowest concentration of SARS-CoV-2 viral copies this assay can detect is 138 copies/mL. A negative result does not preclude SARS-Cov-2 infection and should not be used as the sole basis for treatment or other patient management decisions. A negative result may occur with  improper specimen collection/handling, submission of specimen other than nasopharyngeal swab, presence of viral mutation(s) within the areas targeted by this assay, and inadequate number of  viral copies(<138 copies/mL). A negative result must be combined with clinical observations, patient history, and epidemiological information. The expected result is Negative.  Fact Sheet for Patients:  BloggerCourse.com  Fact Sheet for Healthcare Providers:  SeriousBroker.it  This test is no t yet approved or cleared by the [place] FDA and  has been authorized for detection and/or diagnosis of SARS-CoV-2 by FDA under an Emergency Use Authorization (EUA). This EUA will remain  in effect (meaning this test can be used) for the duration of the COVID-19 declaration under Section 564(b)(1) of the Act, 21 U.S.C.section 360bbb-3(b)(1), unless the authorization is terminated  or revoked sooner.       Influenza A by PCR NEGATIVE NEGATIVE Final   Influenza B by PCR NEGATIVE NEGATIVE Final    Comment: (NOTE) The Xpert Xpress SARS-CoV-2/FLU/RSV plus assay is intended as an aid in the diagnosis of influenza from Nasopharyngeal swab specimens and should not be used as a sole basis for treatment. Nasal washings and aspirates are unacceptable for Xpert Xpress SARS-CoV-2/FLU/RSV testing.  Fact Sheet for Patients: BloggerCourse.com  Fact Sheet for Healthcare Providers: SeriousBroker.it  This test is not yet approved or cleared by the [place] FDA and has been authorized for detection and/or diagnosis of SARS-CoV-2 by FDA under an Emergency Use Authorization (EUA). This EUA will remain in effect (meaning this test can be used) for the duration of the COVID-19 declaration under Section 564(b)(1) of the Act, 21 U.S.C. section 360bbb-3(b)(1), unless the authorization is terminated or revoked.  Performed at [facility], [address], [place], [place] [address]   Urine Culture     Status: None   Collection Time: 06/09/22 10:15 AM   Specimen: Urine, Catheterized  Result Value Ref  Range Status   Specimen Description   Final    URINE, CATHETERIZED Performed at [facility], [address], [place], [place] [address]    Special Requests   Final    NONE Performed at [facility], [address] [place], [place] [address]    Culture   Final    NO GROWTH Performed at [facility], [address], [place], [place] [address]    Report Status 06/11/2022 FINAL  Final  Blood Culture (routine x 2)     Status: None (Preliminary result)   Collection Time: 06/09/22 10:20 AM   Specimen: BLOOD  Result Value Ref Range Status   Specimen Description BLOOD  Final   Special Requests RIGHT ANTECUBITAL Blood Culture adequate volume  Final   Culture   Final    NO GROWTH 2 DAYS Performed at [facility], [address] [place], [place] [address]    Report Status PENDING  Incomplete  Blood Culture (routine x 2)  Status: None (Preliminary result)   Collection Time: 06/09/22 10:38 AM   Specimen: BLOOD  Result Value Ref Range Status   Specimen Description BLOOD BOTTLES DRAWN AEROBIC ONLY  Final   Special Requests RIGHT ANTECUBITAL Blood Culture adequate volume  Final   Culture   Final    NO GROWTH 2 DAYS Performed at [facility], [address] [place], [place] [address]    Report Status PENDING  Incomplete  MRSA Next Gen by PCR, Nasal     Status: None   Collection Time: 06/09/22  3:31 PM   Specimen: Nasal Mucosa; Nasal Swab  Result Value Ref Range Status   MRSA by PCR Next Gen NOT DETECTED NOT DETECTED Final    Comment: (NOTE) The GeneXpert MRSA Assay (FDA approved for NASAL specimens only), is one component of a comprehensive MRSA colonization surveillance program. It is not intended to diagnose MRSA infection nor to guide or monitor treatment for MRSA infections. Test performance is not FDA approved in patients less than 73 years old. Performed at [facility], [address], [place], [place] [address]          Radiology Studies: No results  found.      Scheduled Meds:  apixaban  5 mg Oral BID   atorvastatin  80 mg Oral Daily   Chlorhexidine Gluconate Cloth  6 each Topical Daily   hydrocortisone sod succinate (SOLU-CORTEF) inj  50 mg Intravenous Q12H   insulin aspart  0-5 Units Subcutaneous QHS   insulin aspart  0-9 Units Subcutaneous TID WC   levothyroxine  25 mcg Oral Daily   pantoprazole  40 mg Oral BID   [START ON 06/12/2022] polyethylene glycol  17 g Oral Daily   [START ON 06/12/2022] predniSONE  40 mg Oral Q breakfast   Continuous Infusions:  sodium chloride 75 mL/hr at 06/11/22 1036   sodium chloride 20 mL/hr at 06/09/22 1900     LOS: 2 days    Time spent:    Erick Blinks, MD [facility]   If 7PM-7AM, please contact night-coverage [email or web address]  06/11/2022, 11:30 AM

## 2022-06-11 NOTE — Progress Notes (Signed)
Patient received A&Ox4. Patient was placed back on levo overnight for low blood pressures. This morning blood pressure 127/74. Levo is now off. Patient is experiencing low blood sugar at this time. Will cont to monitor and endorse.

## 2022-06-11 NOTE — Progress Notes (Signed)
Date and time results received: 06/11/22 0730   Test: CBG Critical Value: 22  Name of Provider Notified: Dr. Kerry Hough  Orders Received? Or Actions Taken?: Actions Taken: 8oz orange juice given. Recheck CBG 15 min flu  84

## 2022-06-11 NOTE — Progress Notes (Signed)
Patient received in afib rate controlled at 84. Per night shift RN, patient converted into afib at 0600. Dr. Kerry Hough made aware at this time.

## 2022-06-12 DIAGNOSIS — A419 Sepsis, unspecified organism: Secondary | ICD-10-CM | POA: Diagnosis not present

## 2022-06-12 DIAGNOSIS — N179 Acute kidney failure, unspecified: Secondary | ICD-10-CM | POA: Diagnosis not present

## 2022-06-12 DIAGNOSIS — I482 Chronic atrial fibrillation, unspecified: Secondary | ICD-10-CM | POA: Diagnosis not present

## 2022-06-12 DIAGNOSIS — G9341 Metabolic encephalopathy: Secondary | ICD-10-CM | POA: Diagnosis not present

## 2022-06-12 LAB — BASIC METABOLIC PANEL
Anion gap: 6 (ref 5–15)
BUN: 27 mg/dL — ABNORMAL HIGH (ref 8–23)
CO2: 19 mmol/L — ABNORMAL LOW (ref 22–32)
Calcium: 8.5 mg/dL — ABNORMAL LOW (ref 8.9–10.3)
Chloride: 108 mmol/L (ref 98–111)
Creatinine, Ser: 1.4 mg/dL — ABNORMAL HIGH (ref 0.44–1.00)
GFR, Estimated: 40 mL/min — ABNORMAL LOW (ref >60)
Glucose, Bld: 177 mg/dL — ABNORMAL HIGH (ref 70–99)
Potassium: 3.4 mmol/L — ABNORMAL LOW (ref 3.5–5.1)
Sodium: 133 mmol/L — ABNORMAL LOW (ref 135–145)

## 2022-06-12 LAB — GLUCOSE, CAPILLARY
Glucose-Capillary: 131 mg/dL — ABNORMAL HIGH (ref 70–99)
Glucose-Capillary: 129 mg/dL — ABNORMAL HIGH (ref 70–99)
Glucose-Capillary: 173 mg/dL — ABNORMAL HIGH (ref 70–99)
Glucose-Capillary: 193 mg/dL — ABNORMAL HIGH (ref 70–99)

## 2022-06-12 LAB — TSH: TSH: 0.449 u[IU]/mL (ref 0.350–4.500)

## 2022-06-12 LAB — MAGNESIUM: Magnesium: 1.4 mg/dL — ABNORMAL LOW (ref 1.7–2.4)

## 2022-06-12 MED ORDER — MAGNESIUM SULFATE 4 GM/100ML IV SOLN
4.0000 g | Freq: Once | INTRAVENOUS | Status: AC
  Administered 2022-06-12: 4 g via INTRAVENOUS
  Filled 2022-06-12: qty 100

## 2022-06-12 MED ORDER — POTASSIUM CHLORIDE CRYS ER 20 MEQ PO TBCR
40.0000 meq | EXTENDED_RELEASE_TABLET | Freq: Once | ORAL | Status: AC
  Administered 2022-06-12: 40 meq via ORAL
  Filled 2022-06-12: qty 2

## 2022-06-12 MED ORDER — METOPROLOL SUCCINATE ER 50 MG PO TB24
50.0000 mg | ORAL_TABLET | Freq: Every day | ORAL | Status: DC
  Administered 2022-06-12 – 2022-06-14 (×3): 50 mg via ORAL
  Filled 2022-06-12 (×3): qty 1

## 2022-06-12 NOTE — Progress Notes (Signed)
PROGRESS NOTE    Megan [name]  XBD:[id number] DOB: 10-Dec-1950 DOA: 06/09/2022 PCP: [name], MD    Brief Narrative:  71 year old female admitted to the hospital with increasing lethargy.  Found to be hypothermic, hypotensive on arrival.  Possible UTI, dehydration and AKI.  Admitted for sepsis with septic shock.  Aggressively hydrated and required vasopressor support on admission.  She is on IV antibiotics.  Appears to be clinically improving.   Assessment & Plan:   Principal Problem:   Sepsis (HCC) Active Problems:   Chronic pain   DM type 2 (diabetes mellitus, type 2) (HCC)   Benign essential HTN   Acute metabolic encephalopathy   Atrial fibrillation, chronic (HCC)   UTI (urinary tract infection)   AKI (acute kidney injury) (HCC)   Septic shock (HCC)   Hypothyroidism   Stage 3b chronic kidney disease (CKD) (HCC)   Hypokalemia   Hypothermia    Septic shock -Patient received 3 to 4 L of IV fluids in the emergency room, despite aggressive IV hydration she remained hypotensive -She was hypothermic, hypotensive, elevated lactate (3.0 on admission), acute kidney injury and mental status changes on admission -Blood cultures and urine cultures have shown no growth -Initially started on cefepime and vancomycin -Started on stress dose steroids with IV hydrocortisone since she is on chronic prednisone therapy.  This has since been transitioned to prednisone taper. -Lactic acid has now normalized -Continue IV hydration -She has been weaned off of Levophed -Since cultures have shown no growth, she does not appear toxic and is clinically improving, further antibiotics discontinued.  Continue to observe clinical status   Acute kidney injury on chronic kidney disease stage IIIb -Baseline creatinine appears to be around 1.4 -Admission creatinine noted to be 2.1 -Likely related to hypotension, volume depletion -Improving with IV fluids, current creatinine 1.4 -Continue to  hold home dose of enalapril and Lasix   Atrial fibrillation -Continue on apixaban -Resume metoprolol for rate control -Heart rate currently stable   Diabetes -Holding home dose of metformin as well as Jardiance -Started on sliding scale insulin -Blood sugars currently stable   Acute metabolic encephalopathy -Likely related to hypotension and sepsis -Overall mental status appears to be back to baseline -Continue to monitor   Hypothyroidism -Continue Synthroid -TSH 0.44   Hypokalemia -Replaced  Hypomagnesemia -Replace   Chronic pain -Resume low-dose opiates for pain now that mental status has improved   Anxiety -Resume reduced dose of Klonopin so as to not precipitate withdrawal  Generalized weakness Recurrent falls -PT/OT evaluated patient with recommendations for SNF placement   Goals of care -Patient agrees to DNR status, but wishes to continue with all other available treatments -Plan to discharge to skilled nursing facility   DVT prophylaxis:  apixaban (ELIQUIS) tablet 5 mg  Code Status: DNR Family Communication: Updated husband at the bedside Disposition Plan: Status is: Inpatient Remains inpatient appropriate because: Needs placement to skilled nursing facility     Consultants:    Procedures:    Antimicrobials:  Cefepime 11/24 >11/26 Vancomycin 11/24 >11/26   Subjective: She says she did not sleep well overnight, so she is sleepy today.  She denies any complaints.  Objective: Vitals:   06/11/22 0742 06/11/22 1016 06/11/22 1034 06/11/22 1100  BP: 127/74 (!) 148/91  (!) 152/67  Pulse: 89 (!) 118 92 72  Resp: (!) 23 (!) 23 (!) 21 (!) 24  Temp: 98.5 F (36.9 C)     TempSrc: Axillary     SpO2: 100% 100%  100% 98%  Weight:      Height:        Intake/Output Summary (Last 24 hours) at 06/11/2022 1130 Last data filed at 06/11/2022 1036 Gross per 24 hour  Intake 1918.68 ml  Output 1000 ml  Net 918.68 ml   Filed Weights   06/09/22 2058   Weight: 78.5 kg    Examination:  General exam: Appears calm and comfortable  Respiratory system: Clear to auscultation. Respiratory effort normal. Cardiovascular system: S1 & S2 heard, RRR. No JVD, murmurs, rubs, gallops or clicks. No pedal edema. Gastrointestinal system: Abdomen is nondistended, soft and nontender. No organomegaly or masses felt. Normal bowel sounds heard. Central nervous system: Alert and oriented. No focal neurological deficits. Extremities: Symmetric 5 x 5 power. Skin: No rashes, lesions or ulcers Psychiatry: Judgement and insight appear normal. Mood & affect appropriate.     Data Reviewed: I have personally reviewed following labs and imaging studies  CBC: Recent Labs  Lab 06/09/22 1020 06/10/22 0319  WBC 6.0 8.1  NEUTROABS 3.7  --   HGB 11.6* 10.6*  HCT 36.2 33.5*  MCV 98.6 99.1  PLT 173 201   Basic Metabolic Panel: Recent Labs  Lab 06/09/22 1020 06/10/22 0319 06/11/22 0602  NA 140 142 135  K 3.2* 4.1 4.3  CL 102 116* 108  CO2 25 19* 19*  GLUCOSE 112* 130* 60*  BUN 31* 26* 28*  CREATININE 2.14* 1.65* 1.53*  CALCIUM 8.6* 8.0* 8.8*  PHOS  --   --  2.5   GFR: Estimated Creatinine Clearance: 32.3 mL/min (A) (by C-G formula based on SCr of 1.53 mg/dL (H)). Liver Function Tests: Recent Labs  Lab 06/09/22 1020 06/10/22 0319 06/11/22 0602  AST 22 19  --   ALT 11 11  --   ALKPHOS 128* 99  --   BILITOT 0.4 0.3  --   PROT 5.9* 5.3*  --   ALBUMIN 2.6* 2.3* 2.7*   No results for input(s): "LIPASE", "AMYLASE" in the last 168 hours. No results for input(s): "AMMONIA" in the last 168 hours. Coagulation Profile: Recent Labs  Lab 06/09/22 1020  INR 1.2   Cardiac Enzymes: No results for input(s): "CKTOTAL", "CKMB", "CKMBINDEX", "TROPONINI" in the last 168 hours. BNP (last 3 results) No results for input(s): "PROBNP" in the last 8760 hours. HbA1C: No results for input(s): "HGBA1C" in the last 72 hours. CBG: Recent Labs  Lab  06/11/22 0010 06/11/22 0343 06/11/22 0730 06/11/22 0751 06/11/22 1103  GLUCAP 179* 124* 59* 84 111*   Lipid Profile: No results for input(s): "CHOL", "HDL", "LDLCALC", "TRIG", "CHOLHDL", "LDLDIRECT" in the last 72 hours. Thyroid Function Tests: No results for input(s): "TSH", "T4TOTAL", "FREET4", "T3FREE", "THYROIDAB" in the last 72 hours. Anemia Panel: No results for input(s): "VITAMINB12", "FOLATE", "FERRITIN", "TIBC", "IRON", "RETICCTPCT" in the last 72 hours. Sepsis Labs: Recent Labs  Lab 06/09/22 1015 06/09/22 1219 06/09/22 1457 06/09/22 1752  LATICACIDVEN 3.0* 3.2* 1.6 1.3    Recent Results (from the past 240 hour(s))  Resp Panel by RT-PCR (Flu A&B, Covid) Anterior Nasal Swab     Status: None   Collection Time: 06/09/22 10:15 AM   Specimen: Anterior Nasal Swab  Result Value Ref Range Status   SARS Coronavirus 2 by RT PCR NEGATIVE NEGATIVE Final    Comment: (NOTE) SARS-CoV-2 target nucleic acids are NOT DETECTED.  The SARS-CoV-2 RNA is generally detectable in upper respiratory specimens during the acute phase of infection. The lowest concentration of SARS-CoV-2 viral copies this assay  can detect is 138 copies/mL. A negative result does not preclude SARS-Cov-2 infection and should not be used as the sole basis for treatment or other patient management decisions. A negative result may occur with  improper specimen collection/handling, submission of specimen other than nasopharyngeal swab, presence of viral mutation(s) within the areas targeted by this assay, and inadequate number of viral copies(<138 copies/mL). A negative result must be combined with clinical observations, patient history, and epidemiological information. The expected result is Negative.  Fact Sheet for Patients:  BloggerCourse.com  Fact Sheet for Healthcare Providers:  SeriousBroker.it  This test is no t yet approved or cleared by the [place] FDA and  has been authorized for detection and/or diagnosis of SARS-CoV-2 by FDA under an Emergency Use Authorization (EUA). This EUA will remain  in effect (meaning this test can be used) for the duration of the COVID-19 declaration under Section 564(b)(1) of the Act, 21 U.S.C.section 360bbb-3(b)(1), unless the authorization is terminated  or revoked sooner.       Influenza A by PCR NEGATIVE NEGATIVE Final   Influenza B by PCR NEGATIVE NEGATIVE Final    Comment: (NOTE) The Xpert Xpress SARS-CoV-2/FLU/RSV plus assay is intended as an aid in the diagnosis of influenza from Nasopharyngeal swab specimens and should not be used as a sole basis for treatment. Nasal washings and aspirates are unacceptable for Xpert Xpress SARS-CoV-2/FLU/RSV testing.  Fact Sheet for Patients: BloggerCourse.com  Fact Sheet for Healthcare Providers: SeriousBroker.it  This test is not yet approved or cleared by the [place] FDA and has been authorized for detection and/or diagnosis of SARS-CoV-2 by FDA under an Emergency Use Authorization (EUA). This EUA will remain in effect (meaning this test can be used) for the duration of the COVID-19 declaration under Section 564(b)(1) of the Act, 21 U.S.C. section 360bbb-3(b)(1), unless the authorization is terminated or revoked.  Performed at [facility], [address] [place], [place] [address]   Urine Culture     Status: None   Collection Time: 06/09/22 10:15 AM   Specimen: Urine, Catheterized  Result Value Ref Range Status   Specimen Description   Final    URINE, CATHETERIZED Performed at [facility], [address], [place], [place] [address]    Special Requests   Final    NONE Performed at [facility], [address], [place], [place] [address]    Culture   Final    NO GROWTH Performed at [facility], [address], [place], [place] [address]    Report Status 06/11/2022 FINAL   Final  Blood Culture (routine x 2)     Status: None (Preliminary result)   Collection Time: 06/09/22 10:20 AM   Specimen: BLOOD  Result Value Ref Range Status   Specimen Description BLOOD  Final   Special Requests RIGHT ANTECUBITAL Blood Culture adequate volume  Final   Culture   Final    NO GROWTH 2 DAYS Performed at [facility], [address] [place], [place] [address]    Report Status PENDING  Incomplete  Blood Culture (routine x 2)     Status: None (Preliminary result)   Collection Time: 06/09/22 10:38 AM   Specimen: BLOOD  Result Value Ref Range Status   Specimen Description BLOOD BOTTLES DRAWN AEROBIC ONLY  Final   Special Requests RIGHT ANTECUBITAL Blood Culture adequate volume  Final   Culture   Final    NO GROWTH 2 DAYS Performed at [facility], [address], [place],  [place] [address]    Report Status PENDING  Incomplete  MRSA Next Gen by PCR, Nasal     Status: None   Collection Time: 06/09/22  3:31 PM   Specimen: Nasal Mucosa; Nasal Swab  Result Value Ref Range Status   MRSA by PCR Next Gen NOT DETECTED NOT DETECTED Final    Comment: (NOTE) The GeneXpert MRSA Assay (FDA approved for NASAL specimens only), is one component of a comprehensive MRSA colonization surveillance program. It is not intended to diagnose MRSA infection nor to guide or monitor treatment for MRSA infections. Test performance is not FDA approved in patients less than 58 years old. Performed at [facility], [address] [place], [place] [address]          Radiology Studies: No results found.      Scheduled Meds:  apixaban  5 mg Oral BID   atorvastatin  80 mg Oral Daily   Chlorhexidine Gluconate Cloth  6 each Topical Daily   hydrocortisone sod succinate (SOLU-CORTEF) inj  50 mg Intravenous Q12H   insulin aspart  0-5 Units Subcutaneous QHS   insulin aspart  0-9 Units Subcutaneous TID WC   levothyroxine  25 mcg Oral Daily   pantoprazole  40 mg Oral BID   [START ON 06/12/2022]  polyethylene glycol  17 g Oral Daily   [START ON 06/12/2022] predniSONE  40 mg Oral Q breakfast   Continuous Infusions:  sodium chloride 75 mL/hr at 06/11/22 1036   sodium chloride 20 mL/hr at 06/09/22 1900     LOS: 2 days    Time spent:    Erick Blinks, MD [facility]   If 7PM-7AM, please contact night-coverage [email or web address]  06/11/2022, 11:30 AM

## 2022-06-12 NOTE — Care Management Important Message (Signed)
Important Message  Patient Details  Name: Megan [name] MRN: [id number] Date of Birth: [birth year]   Medicare Important Message Given:  Yes     Corey Harold 06/12/2022, 11:57 AM

## 2022-06-12 NOTE — Progress Notes (Signed)
To Whom It May Concern:  Please be advised that the above name patient will require a short-term nursing home stay - anticipated 30 days or less rehabilitation and strengthening.  The plan is for return home.

## 2022-06-12 NOTE — NC FL2 (Signed)
[place] MEDICAID FL2 LEVEL OF CARE SCREENING TOOL     IDENTIFICATION  Patient Name: Megan [name] Birthdate: Mar 10, 1951 Sex: female Admission Date (Current Location): 06/09/2022  [facility] and [place] Number:  [employer] and Address:  [facility],  [address] [place] [address]      Provider Number: [id number]  Attending Physician Name and Address:  Erick Blinks, MD  Relative Name and Phone Number:  [name], Braziel (Spouse) [phone]    Current Level of Care: Hospital Recommended Level of Care: Skilled Nursing Facility Prior Approval Number:    Date Approved/Denied:   PASRR Number: pending  Discharge Plan: SNF    Current Diagnoses: Patient Active Problem List   Diagnosis Date Noted   Stage 3b chronic kidney disease (CKD) (HCC) 06/09/2022   Hypokalemia 06/09/2022   Hypothermia 06/09/2022   CAP (community acquired pneumonia) 04/14/2022   Hypothyroidism 04/14/2022   Peripheral edema 04/14/2022   Fall at home, initial encounter 04/14/2022   Benzodiazepine overdose 04/14/2022   Acute respiratory failure with hypoxia (HCC) 04/14/2022   Sepsis (HCC) 03/30/2022   AKI (acute kidney injury) (HCC) 03/30/2022   Septic shock (HCC) 03/30/2022   Closed fracture of navicular bone of left wrist  05/13/18 07/18/2018   Umbilical pain 05/01/2018   Toxic encephalopathy 12/21/2017   Hypotension due to drugs 12/21/2017   Confusion 12/20/2017   Hypotension 12/20/2017   UTI (urinary tract infection) 12/20/2017   Atrial fibrillation, chronic (HCC) 11/17/2017   Current use of long term anticoagulation 11/17/2017   Weakness 11/14/2017   Acute metabolic encephalopathy 11/14/2017   PUD (peptic ulcer disease) 04/07/2016   Constipation 04/07/2016   Diverticulosis of colon without hemorrhage    Rectal bleeding    BRBPR (bright red blood per rectum) 01/25/2016   Hypoglycemia 01/20/2016   Upper GI bleeding 01/20/2016   H/O: CVA (cerebrovascular  accident) 01/20/2016   Chronic pain 01/20/2016   DM type 2 (diabetes mellitus, type 2) (HCC) 01/20/2016   Benign essential HTN 01/20/2016   Anxiety and depression 01/20/2016   Blood loss anemia 01/20/2016   Knee pain, acute    Cephalalgia    GERD (gastroesophageal reflux disease) 06/14/2011   FH: colon cancer 04/05/2011   History of colon polyps 04/05/2011   Left sided abdominal pain 04/05/2011   N&V (nausea and vomiting) 04/05/2011    Orientation RESPIRATION BLADDER Height & Weight     Self  Normal Incontinent Weight: 184 lb 1.4 oz (83.5 kg) Height:  5' (152.4 cm)  BEHAVIORAL SYMPTOMS/MOOD NEUROLOGICAL BOWEL NUTRITION STATUS      Incontinent Diet (heart healthy/carb modified)  AMBULATORY STATUS COMMUNICATION OF NEEDS Skin   Limited Assist Verbally PU Stage and Appropriate Care (coccyx mid)                       Personal Care Assistance Level of Assistance  Bathing, Feeding, Dressing Bathing Assistance: Limited assistance Feeding assistance: Independent Dressing Assistance: Limited assistance     Functional Limitations Info  Sight, Hearing, Speech Sight Info: Adequate Hearing Info: Adequate Speech Info: Adequate    SPECIAL CARE FACTORS FREQUENCY  PT (By licensed PT)     PT Frequency: 5x/week              Contractures Contractures Info: Not present    Additional Factors Info  Code Status, Allergies, Psychotropic Code Status Info: DNR Allergies Info: Aleve, Naproxen Psychotropic Info: Lexapro         Current Medications (06/12/2022):  This is the current hospital active medication list Current Facility-Administered Medications  Medication Dose Route Frequency Provider Last Rate Last Admin   0.45 % sodium chloride infusion   Intravenous Continuous Erick Blinks, MD 75 mL/hr at 06/12/22 1041 Restarted at 06/12/22 1041   0.9 %  sodium chloride infusion  250 mL Intravenous Continuous Erick Blinks, MD   Stopped at 06/10/22 0353   acetaminophen  (TYLENOL) tablet 650 mg  650 mg Oral Q6H PRN Erick Blinks, MD       Or   acetaminophen (TYLENOL) suppository 650 mg  650 mg Rectal Q6H PRN Erick Blinks, MD       albuterol (PROVENTIL) (2.5 MG/3ML) 0.083% nebulizer solution 2.5 mg  2.5 mg Nebulization Q2H PRN Erick Blinks, MD       apixaban (ELIQUIS) tablet 5 mg  5 mg Oral BID Erick Blinks, MD   5 mg at 06/12/22 0908   atorvastatin (LIPITOR) tablet 80 mg  80 mg Oral Daily Erick Blinks, MD   80 mg at 06/12/22 0908   Chlorhexidine Gluconate Cloth 2 % PADS 6 each  6 each Topical Daily Erick Blinks, MD   6 each at 06/12/22 1042   clonazePAM (KLONOPIN) tablet 0.5 mg  0.5 mg Oral BID PRN Erick Blinks, MD   0.5 mg at 06/11/22 0950   insulin aspart (novoLOG) injection 0-5 Units  0-5 Units Subcutaneous QHS Erick Blinks, MD   2 Units at 06/11/22 2250   insulin aspart (novoLOG) injection 0-9 Units  0-9 Units Subcutaneous TID WC Erick Blinks, MD   2 Units at 06/12/22 1211   lactulose (CHRONULAC) 10 GM/15ML solution 10 g  10 g Oral BID PRN Erick Blinks, MD       levothyroxine (SYNTHROID) tablet 25 mcg  25 mcg Oral Daily Erick Blinks, MD   25 mcg at 06/12/22 0554   melatonin tablet 6 mg  6 mg Oral QHS Zierle-Ghosh, Asia B, DO   6 mg at 06/11/22 2319   metoprolol succinate (TOPROL-XL) 24 hr tablet 50 mg  50 mg Oral Daily Erick Blinks, MD   50 mg at 06/12/22 0907   ondansetron (ZOFRAN) tablet 4 mg  4 mg Oral Q6H PRN Erick Blinks, MD       Or   ondansetron (ZOFRAN) injection 4 mg  4 mg Intravenous Q6H PRN Erick Blinks, MD   4 mg at 06/11/22 1200   oxyCODONE-acetaminophen (PERCOCET/ROXICET) 5-325 MG per tablet 1 tablet  1 tablet Oral Q6H PRN Erick Blinks, MD       pantoprazole (PROTONIX) EC tablet 40 mg  40 mg Oral BID Erick Blinks, MD   40 mg at 06/12/22 0907   polyethylene glycol (MIRALAX / GLYCOLAX) packet 17 g  17 g Oral Daily Erick Blinks, MD   17 g at 06/12/22 6808   predniSONE (DELTASONE) tablet 40 mg  40 mg  Oral Q breakfast Erick Blinks, MD   40 mg at 06/12/22 8110     Discharge Medications: Please see discharge summary for a list of discharge medications.  Relevant Imaging Results:  Relevant Lab Results:   Additional Information SSN [id number] [phone]. Patient states that she is vaccinated for COVID.  Shaqueena Mauceri, Juleen China, LCSW

## 2022-06-12 NOTE — TOC Initial Note (Signed)
Transition of Care [facility]) - Initial/Assessment Note    Patient Details  Name: Megan [name] MRN: [id number] Date of Birth: 07-21-1950  Transition of Care [facility]) CM/SW Contact:    Annice Needy, LCSW Phone Number: 06/12/2022, 2:26 PM  Clinical Narrative:                 Pati\ent from home with spouse . PT recommends SNF. Patient is considering. Referred to SNFs of choice.   Expected Discharge Plan: Skilled Nursing Facility Barriers to Discharge: Continued Medical Work up   Patient Goals and CMS Choice     Choice offered to / list presented to : Patient, Spouse  Expected Discharge Plan and Services Expected Discharge Plan: Skilled Nursing Facility     Post Acute Care Choice: Skilled Nursing Facility Living arrangements for the past 2 months: Single Family Home                                      Prior Living Arrangements/Services Living arrangements for the past 2 months: Single Family Home Lives with:: Spouse Patient language and need for interpreter reviewed:: Yes Do you feel safe going back to the place where you live?: Yes      Need for Family Participation in Patient Care: Yes (Comment) Care giver support system in place?: Yes (comment) Current home services: DME (cane, walker, bsc, special shower) Criminal Activity/Legal Involvement Pertinent to Current Situation/Hospitalization: No - Comment as needed  Activities of Daily Living Home Assistive Devices/Equipment: CBG Meter, Wheelchair ADL Screening (condition at time of admission) Patient's cognitive ability adequate to safely complete daily activities?: Yes Is the patient deaf or have difficulty hearing?: No Does the patient have difficulty seeing, even when wearing glasses/contacts?: No Does the patient have difficulty concentrating, remembering, or making decisions?: Yes Patient able to express need for assistance with ADLs?: Yes Does the patient have difficulty dressing or bathing?:  Yes Independently performs ADLs?: No Communication: Independent Dressing (OT): Needs assistance Is this a change from baseline?: Pre-admission baseline Grooming: Needs assistance Is this a change from baseline?: Pre-admission baseline Feeding: Independent Bathing: Needs assistance Is this a change from baseline?: Pre-admission baseline Toileting: Needs assistance Is this a change from baseline?: Pre-admission baseline In/Out Bed: Needs assistance Is this a change from baseline?: Pre-admission baseline Walks in Home: Needs assistance Is this a change from baseline?: Pre-admission baseline Does the patient have difficulty walking or climbing stairs?: Yes Weakness of Legs: Both Weakness of Arms/Hands: None  Permission Sought/Granted Permission sought to share information with : Family Supports    Share Information with NAME: Megan [name]           Emotional Assessment     Affect (typically observed): Appropriate Orientation: : Oriented to Self, Oriented to Place Alcohol / Substance Use: Not Applicable Psych Involvement: No (comment)  Admission diagnosis:  Sepsis (HCC) [A41.9] Patient Active Problem List   Diagnosis Date Noted   Stage 3b chronic kidney disease (CKD) (HCC) 06/09/2022   Hypokalemia 06/09/2022   Hypothermia 06/09/2022   CAP (community acquired pneumonia) 04/14/2022   Hypothyroidism 04/14/2022   Peripheral edema 04/14/2022   Fall at home, initial encounter 04/14/2022   Benzodiazepine overdose 04/14/2022   Acute respiratory failure with hypoxia (HCC) 04/14/2022   Sepsis (HCC) 03/30/2022   AKI (acute kidney injury) (HCC) 03/30/2022   Septic shock (HCC) 03/30/2022   Closed fracture of navicular bone of left wrist  05/13/18 07/18/2018   Umbilical pain 05/01/2018   Toxic encephalopathy 12/21/2017   Hypotension due to drugs 12/21/2017   Confusion 12/20/2017   Hypotension 12/20/2017   UTI (urinary tract infection) 12/20/2017   Atrial fibrillation, chronic  (HCC) 11/17/2017   Current use of long term anticoagulation 11/17/2017   Weakness 11/14/2017   Acute metabolic encephalopathy 11/14/2017   PUD (peptic ulcer disease) 04/07/2016   Constipation 04/07/2016   Diverticulosis of colon without hemorrhage    Rectal bleeding    BRBPR (bright red blood per rectum) 01/25/2016   Hypoglycemia 01/20/2016   Upper GI bleeding 01/20/2016   H/O: CVA (cerebrovascular accident) 01/20/2016   Chronic pain 01/20/2016   DM type 2 (diabetes mellitus, type 2) (HCC) 01/20/2016   Benign essential HTN 01/20/2016   Anxiety and depression 01/20/2016   Blood loss anemia 01/20/2016   Knee pain, acute    Cephalalgia    GERD (gastroesophageal reflux disease) 06/14/2011   FH: colon cancer 04/05/2011   History of colon polyps 04/05/2011   Left sided abdominal pain 04/05/2011   N&V (nausea and vomiting) 04/05/2011   PCP:  [name], MD Pharmacy:   [facility] [place] [place] - [address] [place] [address] Phone: [phone] Fax: [phone]     Social Determinants of Health (SDOH) Interventions    Readmission Risk Interventions     No data to display

## 2022-06-12 NOTE — Evaluation (Signed)
Occupational Therapy Evaluation Patient Details Name: Megan [name] MRN: [id number] DOB: [birth year] Today's Date: 06/12/2022   History of Present Illness 71 y.o. F admitted on 06/09/22 due to increased lethargy. Upon admission pt was found to be hypothermic and hypotensive. Undergoing sepsis work up at this time. PMH significant for paroxysmal A-fib on anticoagulation, chronic pain syndrome on opiates, diabetes, hypertension, hypothyroidism, prior stroke, anxiety on benzodiazepines, chronic steroid use for arthritis.   Clinical Impression   Pt admitted for concerns listed above. PTA pt reported that she was having to get her husband to assist with ADL's and IADL's intermittently. At this time, pt presents with increased weakness and balance deficits. She is requiring min-max A for ADL's and min A for functional mobility. Pt states that her husband is in "as bad of shape as she is" and does not like assisting her. Recommending SNF level therapies to maximize independence and safety before returning home. OT will follow acutely.       Recommendations for follow up therapy are one component of a multi-disciplinary discharge planning process, led by the attending physician.  Recommendations may be updated based on patient status, additional functional criteria and insurance authorization.   Follow Up Recommendations  Skilled nursing-short term rehab (<3 hours/day)     Assistance Recommended at Discharge Frequent or constant Supervision/Assistance  Patient can return home with the following A little help with walking and/or transfers;A lot of help with bathing/dressing/bathroom;Assistance with cooking/housework;Direct supervision/assist for medications management;Direct supervision/assist for financial management;Help with stairs or ramp for entrance    Functional Status Assessment  Patient has had a recent decline in their functional status and demonstrates the ability to make significant  improvements in function in a reasonable and predictable amount of time.  Equipment Recommendations  None recommended by OT    Recommendations for Other Services       Precautions / Restrictions Precautions Precautions: Fall Restrictions Weight Bearing Restrictions: No      Mobility Bed Mobility Overal bed mobility: Needs Assistance Bed Mobility: Supine to Sit     Supine to sit: Min assist     General bed mobility comments: Requiring increased time, use of bed rails and min A to pull up to sitting    Transfers Overall transfer level: Needs assistance Equipment used: Rolling walker (2 wheels) Transfers: Sit to/from Stand Sit to Stand: Min assist           General transfer comment: Min A to power up and steady      Balance Overall balance assessment: Needs assistance Sitting-balance support: Single extremity supported, Feet supported Sitting balance-Leahy Scale: Good     Standing balance support: Bilateral upper extremity supported, Reliant on assistive device for balance Standing balance-Leahy Scale: Poor Standing balance comment: Heavily reliant on RW                           ADL either performed or assessed with clinical judgement   ADL Overall ADL's : Needs assistance/impaired Eating/Feeding: Set up;Sitting   Grooming: Set up;Sitting   Upper Body Bathing: Minimal assistance;Sitting   Lower Body Bathing: Maximal assistance;Sitting/lateral leans;Sit to/from stand   Upper Body Dressing : Minimal assistance;Sitting   Lower Body Dressing: Maximal assistance;Sitting/lateral leans;Sit to/from stand   Toilet Transfer: Minimal assistance;Ambulation   Toileting- Clothing Manipulation and Hygiene: Maximal assistance;Sitting/lateral lean;Sit to/from stand       Functional mobility during ADLs: Minimal assistance;Rolling walker (2 wheels) General ADL Comments: Pt requiring increased  assist for all ADL's due to weakness and balance deficits.      Vision Baseline Vision/History: 1 Wears glasses Ability to See in Adequate Light: 0 Adequate Patient Visual Report: No change from baseline Vision Assessment?: Vision impaired- to be further tested in functional context Additional Comments: Pt running into items on the L side, reports due to not having glasses, however  continues to run into things despite cuing.     Perception Perception Perception Tested?: No   Praxis Praxis Praxis tested?: Not tested    Pertinent Vitals/Pain Pain Assessment Pain Assessment: No/denies pain     Hand Dominance Right   Extremity/Trunk Assessment Upper Extremity Assessment Upper Extremity Assessment: Generalized weakness   Lower Extremity Assessment Lower Extremity Assessment: Defer to PT evaluation   Cervical / Trunk Assessment Cervical / Trunk Assessment: Kyphotic   Communication Communication Communication: No difficulties   Cognition Arousal/Alertness: Awake/alert Behavior During Therapy: WFL for tasks assessed/performed Overall Cognitive Status: Within Functional Limits for tasks assessed                                       General Comments  VSS on RA    Exercises     Shoulder Instructions      Home Living Family/patient expects to be discharged to:: Private residence Living Arrangements: Spouse/significant other Available Help at Discharge: Family;Available 24 hours/day Type of Home: Mobile home Home Access: Ramped entrance     Home Layout: One level     Bathroom Shower/Tub: Producer, television/film/video: Handicapped height Bathroom Accessibility: Yes How Accessible: Accessible via walker Home Equipment: Rolling Walker (2 wheels);Cane - single point;Grab bars - tub/shower;Shower seat;Grab bars - toilet          Prior Functioning/Environment Prior Level of Function : Needs assist             Mobility Comments: household ambulator using RW, assisted for bed mobility ADLs  Comments: assisted by family        OT Problem List: Decreased strength;Decreased activity tolerance;Impaired balance (sitting and/or standing);Decreased safety awareness;Impaired vision/perception;Impaired UE functional use      OT Treatment/Interventions: Self-care/ADL training;Therapeutic exercise;Energy conservation;DME and/or AE instruction;Therapeutic activities;Patient/family education;Balance training    OT Goals(Current goals can be found in the care plan section) Acute Rehab OT Goals Patient Stated Goal: To go home OT Goal Formulation: With patient Time For Goal Achievement: 06/26/22 Potential to Achieve Goals: Good ADL Goals Pt Will Perform Grooming: with modified independence;standing Pt Will Perform Lower Body Bathing: with supervision;sit to/from stand;sitting/lateral leans Pt Will Perform Lower Body Dressing: with supervision;sitting/lateral leans;sit to/from stand Pt Will Transfer to Toilet: with supervision;ambulating Pt Will Perform Toileting - Clothing Manipulation and hygiene: with supervision;sitting/lateral leans;sit to/from stand;with adaptive equipment  OT Frequency: Min 1X/week    Co-evaluation              AM-PAC OT "6 Clicks" Daily Activity     Outcome Measure Help from another person eating meals?: A Little Help from another person taking care of personal grooming?: A Little Help from another person toileting, which includes using toliet, bedpan, or urinal?: A Lot Help from another person bathing (including washing, rinsing, drying)?: A Lot Help from another person to put on and taking off regular upper body clothing?: A Little Help from another person to put on and taking off regular lower body clothing?: A Lot 6 Click Score: 15  End of Session Equipment Utilized During Treatment: Gait belt;Rolling walker (2 wheels) Nurse Communication: Mobility status  Activity Tolerance: Patient tolerated treatment well Patient left: in chair;with call  bell/phone within reach  OT Visit Diagnosis: Unsteadiness on feet (R26.81);Other abnormalities of gait and mobility (R26.89);Muscle weakness (generalized) (M62.81)                Time: 3532-9924 OT Time Calculation (min): 21 min Charges:  OT General Charges $OT Visit: 1 Visit OT Evaluation $OT Eval Low Complexity: 1 Low  Zaidyn Claire Bing Plume, OTR/L [facility]  Narada Uzzle Elane Bing Plume 06/12/2022, 11:16 AM

## 2022-06-12 NOTE — Evaluation (Signed)
Physical Therapy Evaluation Patient Details Name: Megan [name] MRN: [id number] DOB: [birth year] Today's Date: 06/12/2022  History of Present Illness  71 y.o. F admitted on 06/09/22 due to increased lethargy. Upon admission pt was found to be hypothermic and hypotensive. Undergoing sepsis work up at this time. PMH significant for paroxysmal A-fib on anticoagulation, chronic pain syndrome on opiates, diabetes, hypertension, hypothyroidism, prior stroke, anxiety on benzodiazepines, chronic steroid use for arthritis.   Clinical Impression  Patient demonstrates labored movement for sitting up at bedside requiring use of bed rails. Patient able to stand, transfer to chair, and ambulate in hallway with slowed cadence and RW/min guard with no loss of balance but easily distracted. Patient tolerated sitting up in chair after therapy. Recommending SNF > HHPT due to multiple recent hospital admissions, continued fall risk and decreased endurance. Patient will benefit from continued skilled physical therapy in hospital and recommended venue below to increase strength, balance, endurance for safe ADLs and gait.    Recommendations for follow up therapy are one component of a multi-disciplinary discharge planning process, led by the attending physician.  Recommendations may be updated based on patient status, additional functional criteria and insurance authorization.  Follow Up Recommendations Skilled nursing-short term rehab (<3 hours/day) Can patient physically be transported by private vehicle: Yes    Assistance Recommended at Discharge Set up Supervision/Assistance  Patient can return home with the following  A little help with walking and/or transfers;Assistance with cooking/housework;Help with stairs or ramp for entrance;A little help with bathing/dressing/bathroom    Equipment Recommendations None recommended by PT  Recommendations for Other Services       Functional Status Assessment  Patient has had a recent decline in their functional status and demonstrates the ability to make significant improvements in function in a reasonable and predictable amount of time.     Precautions / Restrictions Precautions Precautions: Fall Restrictions Weight Bearing Restrictions: No      Mobility  Bed Mobility Overal bed mobility: Needs Assistance Bed Mobility: Supine to Sit     Supine to sit: Min assist     General bed mobility comments: pt able to sit up requiring min A use of bedrails and increased time    Transfers Overall transfer level: Needs assistance Equipment used: Rolling walker (2 wheels) Transfers: Sit to/from Stand, Bed to chair/wheelchair/BSC Sit to Stand: Min guard, Min assist   Step pivot transfers: Min guard       General transfer comment: pt able to stand initially with min guard/RW and transfer to chair with slowed movement, did require min assist boost for second STS before ambulation    Ambulation/Gait Ambulation/Gait assistance: Min guard Gait Distance (Feet): 35 Feet Assistive device: Rolling walker (2 wheels) Gait Pattern/deviations: Decreased step length - right, Decreased step length - left, Decreased stride length Gait velocity: slow     General Gait Details: patient with slowed cadence for hallway ambulation requiring min guard and use of RW with no loss of balance, easily distracted  Stairs            Wheelchair Mobility    Modified Rankin (Stroke Patients Only)       Balance Overall balance assessment: Needs assistance Sitting-balance support: Feet supported, No upper extremity supported Sitting balance-Leahy Scale: Good Sitting balance - Comments: seated EOB   Standing balance support: Bilateral upper extremity supported, Reliant on assistive device for balance, During functional activity Standing balance-Leahy Scale: Fair Standing balance comment: with RW  Pertinent  Vitals/Pain Pain Assessment Pain Assessment: No/denies pain    Home Living Family/patient expects to be discharged to:: Private residence Living Arrangements: Spouse/significant other Available Help at Discharge: Family;Available PRN/intermittently Type of Home: Mobile home Home Access: Ramped entrance       Home Layout: One level Home Equipment: Agricultural consultant (2 wheels);Cane - single point;Grab bars - tub/shower;Shower seat;Grab bars - toilet      Prior Function Prior Level of Function : Needs assist       Physical Assist : Mobility (physical);ADLs (physical) Mobility (physical): Bed mobility;Transfers;Gait;Stairs ADLs (physical): Toileting;IADLs Mobility Comments: Household ambulator with RW, gets assistance from husband PRN for bed mobility ADLs Comments: Assisted PRN by husband for toileting and iALDs. Pt reports he doesn't help all the time when she needs it but will sometimes     Hand Dominance   Dominant Hand: Right    Extremity/Trunk Assessment   Upper Extremity Assessment Upper Extremity Assessment: Defer to OT evaluation    Lower Extremity Assessment Lower Extremity Assessment: Generalized weakness    Cervical / Trunk Assessment Cervical / Trunk Assessment: Kyphotic  Communication   Communication: No difficulties  Cognition Arousal/Alertness: Awake/alert Behavior During Therapy: WFL for tasks assessed/performed Overall Cognitive Status: Within Functional Limits for tasks assessed                                          General Comments General comments (skin integrity, edema, etc.): VSS on RA    Exercises     Assessment/Plan    PT Assessment Patient needs continued PT services  PT Problem List Decreased strength;Decreased activity tolerance;Decreased balance;Decreased mobility       PT Treatment Interventions DME instruction;Balance training;Gait training;Stair training;Functional mobility training;Patient/family  education;Therapeutic activities;Therapeutic exercise    PT Goals (Current goals can be found in the Care Plan section)  Acute Rehab PT Goals Patient Stated Goal: return home after rehab PT Goal Formulation: With patient Time For Goal Achievement: 06/26/22 Potential to Achieve Goals: Good    Frequency Min 3X/week     Co-evaluation               AM-PAC PT "6 Clicks" Mobility  Outcome Measure Help needed turning from your back to your side while in a flat bed without using bedrails?: None Help needed moving from lying on your back to sitting on the side of a flat bed without using bedrails?: A Little Help needed moving to and from a bed to a chair (including a wheelchair)?: A Little Help needed standing up from a chair using your arms (e.g., wheelchair or bedside chair)?: A Little Help needed to walk in hospital room?: A Little Help needed climbing 3-5 steps with a railing? : A Lot 6 Click Score: 18    End of Session Equipment Utilized During Treatment: Gait belt Activity Tolerance: Patient tolerated treatment well;Patient limited by fatigue Patient left: in chair;with call bell/phone within reach Nurse Communication: Mobility status PT Visit Diagnosis: Unsteadiness on feet (R26.81);Other abnormalities of gait and mobility (R26.89);Muscle weakness (generalized) (M62.81)    Time: 5638-9373 PT Time Calculation (min) (ACUTE ONLY): 21 min   Charges:   PT Evaluation $PT Eval Moderate Complexity: 1 Mod PT Treatments $Therapeutic Activity: 8-22 mins        Alan Mulder, SPT

## 2022-06-12 NOTE — Progress Notes (Signed)
Pt alert and oriented x 1. Pt has been confused through the night and is seeing things that are not there. Pt stated "They put a balled up power towel with bugs and rats in it on my lunch tray." She also got out of bed and stated "I am going to find [place], I hear him and I know you know he's out there" and refused to get back into bed. PRN Klonopin and oxycodone-acetaminophen given about an hour apart, not effective. Messaged MD, no response.

## 2022-06-12 NOTE — Plan of Care (Signed)
  Problem: Acute Rehab PT Goals(only PT should resolve) Goal: Pt Will Go Supine/Side To Sit Outcome: Progressing Flowsheets (Taken 06/12/2022 1219) Pt will go Supine/Side to Sit: with min guard assist Goal: Patient Will Transfer Sit To/From Stand Outcome: Progressing Flowsheets (Taken 06/12/2022 1219) Patient will transfer sit to/from stand: with supervision Goal: Pt Will Transfer Bed To Chair/Chair To Bed Outcome: Progressing Flowsheets (Taken 06/12/2022 1219) Pt will Transfer Bed to Chair/Chair to Bed: with supervision Goal: Pt Will Ambulate Outcome: Progressing Flowsheets (Taken 06/12/2022 1219) Pt will Ambulate:  with supervision  50 feet  with rolling walker   Alan Mulder, SPT

## 2022-06-12 NOTE — Progress Notes (Signed)
Pt's systolic pressure elevated. Pt requested something to sleep, melatonin given and pt slept through the night. 0/10 pain reported.

## 2022-06-13 LAB — BASIC METABOLIC PANEL
Anion gap: 4 — ABNORMAL LOW (ref 5–15)
BUN: 22 mg/dL (ref 8–23)
CO2: 22 mmol/L (ref 22–32)
Calcium: 8.6 mg/dL — ABNORMAL LOW (ref 8.9–10.3)
Chloride: 105 mmol/L (ref 98–111)
Creatinine, Ser: 1.14 mg/dL — ABNORMAL HIGH (ref 0.44–1.00)
GFR, Estimated: 51 mL/min — ABNORMAL LOW (ref >60)
Glucose, Bld: 181 mg/dL — ABNORMAL HIGH (ref 70–99)
Potassium: 4.2 mmol/L (ref 3.5–5.1)
Sodium: 131 mmol/L — ABNORMAL LOW (ref 135–145)

## 2022-06-13 LAB — GLUCOSE, CAPILLARY
Glucose-Capillary: 167 mg/dL — ABNORMAL HIGH (ref 70–99)
Glucose-Capillary: 211 mg/dL — ABNORMAL HIGH (ref 70–99)
Glucose-Capillary: 280 mg/dL — ABNORMAL HIGH (ref 70–99)
Glucose-Capillary: 228 mg/dL — ABNORMAL HIGH (ref 70–99)

## 2022-06-13 LAB — CBC
HCT: 32.2 % — ABNORMAL LOW (ref 36.0–46.0)
Hemoglobin: 10.7 g/dL — ABNORMAL LOW (ref 12.0–15.0)
MCH: 31.1 pg (ref 26.0–34.0)
MCHC: 33.2 g/dL (ref 30.0–36.0)
MCV: 93.6 fL (ref 80.0–100.0)
Platelets: 165 10*3/uL (ref 150–400)
RBC: 3.44 MIL/uL — ABNORMAL LOW (ref 3.87–5.11)
RDW: 13.9 % (ref 11.5–15.5)
WBC: 6.5 10*3/uL (ref 4.0–10.5)
nRBC: 0 % (ref 0.0–0.2)

## 2022-06-13 LAB — PHOSPHORUS
Phosphorus: 1.6 mg/dL — ABNORMAL LOW (ref 2.5–4.6)
Phosphorus: 4.4 mg/dL (ref 2.5–4.6)

## 2022-06-13 LAB — MAGNESIUM: Magnesium: 2 mg/dL (ref 1.7–2.4)

## 2022-06-13 LAB — AMMONIA: Ammonia: 34 umol/L (ref 9–35)

## 2022-06-13 MED ORDER — SODIUM PHOSPHATES 45 MMOLE/15ML IV SOLN
45.0000 mmol | Freq: Once | INTRAVENOUS | Status: DC
  Filled 2022-06-13: qty 15

## 2022-06-13 MED ORDER — CLONAZEPAM 0.5 MG PO TABS
1.0000 mg | ORAL_TABLET | Freq: Every day | ORAL | Status: DC
  Administered 2022-06-13: 1 mg via ORAL
  Filled 2022-06-13: qty 2

## 2022-06-13 MED ORDER — SODIUM PHOSPHATES 45 MMOLE/15ML IV SOLN: 45.0000 mmol | Freq: Once | INTRAVENOUS | Status: DC

## 2022-06-13 MED ORDER — QUETIAPINE FUMARATE 25 MG PO TABS
25.0000 mg | ORAL_TABLET | Freq: Every day | ORAL | Status: DC
  Administered 2022-06-13: 25 mg via ORAL
  Filled 2022-06-13: qty 1

## 2022-06-13 MED ORDER — PREDNISONE 20 MG PO TABS
30.0000 mg | ORAL_TABLET | Freq: Every day | ORAL | Status: DC
  Administered 2022-06-14: 30 mg via ORAL
  Filled 2022-06-13: qty 2

## 2022-06-13 MED ORDER — LORAZEPAM 2 MG/ML IJ SOLN: 1.0000 mg | Freq: Once | INTRAMUSCULAR | Status: DC

## 2022-06-13 MED ORDER — ESCITALOPRAM OXALATE 10 MG PO TABS
20.0000 mg | ORAL_TABLET | Freq: Every day | ORAL | Status: DC
  Administered 2022-06-13: 20 mg via ORAL
  Filled 2022-06-13: qty 2

## 2022-06-13 MED ORDER — SODIUM PHOSPHATES 45 MMOLE/15ML IV SOLN
45.0000 mmol | Freq: Once | INTRAVENOUS | Status: AC
  Administered 2022-06-13: 45 mmol via INTRAVENOUS
  Filled 2022-06-13: qty 15

## 2022-06-13 NOTE — Progress Notes (Signed)
PROGRESS NOTE    Megan [name]  LOV:[id number] DOB: [birth year] DOA: 06/09/2022 PCP: [name], MD    Brief Narrative:  71 year old female admitted to the hospital with increasing lethargy.  Found to be hypothermic, hypotensive on arrival.  Possible UTI, dehydration and AKI.  Admitted for sepsis with septic shock.  Aggressively hydrated and required vasopressor support on admission.  She is on IV antibiotics.  Appears to be clinically improving.   Assessment & Plan:   Principal Problem:   Sepsis (HCC) Active Problems:   Chronic pain   DM type 2 (diabetes mellitus, type 2) (HCC)   Benign essential HTN   Acute metabolic encephalopathy   Atrial fibrillation, chronic (HCC)   UTI (urinary tract infection)   AKI (acute kidney injury) (HCC)   Septic shock (HCC)   Hypothyroidism   Stage 3b chronic kidney disease (CKD) (HCC)   Hypokalemia   Hypothermia    Septic shock -Patient received 3 to 4 L of IV fluids in the emergency room, despite aggressive IV hydration she remained hypotensive -She was hypothermic, hypotensive, elevated lactate (3.0 on admission), acute kidney injury and mental status changes on admission -Blood cultures and urine cultures have shown no growth -Initially started on cefepime and vancomycin -Started on stress dose steroids with IV hydrocortisone since she is on chronic prednisone therapy.  This has since been transitioned to prednisone taper. -Lactic acid has now normalized -IV fluids have been discontinued -She has been weaned off of Levophed -Since cultures have shown no growth, she does not appear toxic and is clinically improving, further antibiotics discontinued.  Continue to observe clinical status   Acute kidney injury on chronic kidney disease stage IIIb -Baseline creatinine appears to be around 1.4 -Admission creatinine noted to be 2.1 -Likely related to hypotension, volume depletion -Improving with IV fluids, current creatinine  1.1 -Continue to hold home dose of enalapril and Lasix   Atrial fibrillation -Continue on apixaban -Resume metoprolol for rate control -Heart rate currently stable   Diabetes -Holding home dose of metformin as well as Jardiance -Started on sliding scale insulin -Blood sugars currently stable   Acute metabolic encephalopathy -Likely related to hypotension and sepsis -Overall mental status appears to be back to baseline -11/28, she was noted to be more confused/agitated -husband reports that this frequently occurs at home as well -unclear if she has underlying dementia -will check UA and ammonia -restart QHS klonopin, since she says she has not slept well in several nights and she uses it at home to help her sleep -she has trazodone on her home list for sleep, but says she has not used that in quite some time.   Hypothyroidism -Continue Synthroid -TSH 0.44   Hypokalemia -Replaced  Hypomagnesemia -Replace   Chronic pain -Resume low-dose opiates for pain now that mental status has improved   Anxiety -Resume home dose of Klonopin. She says she only takes it at night  Generalized weakness Recurrent falls -PT/OT evaluated patient with recommendations for SNF placement   Goals of care -Patient agrees to DNR status, but wishes to continue with all other available treatments -Plan to discharge to skilled nursing facility   DVT prophylaxis:  apixaban (ELIQUIS) tablet 5 mg  Code Status: DNR Family Communication: Updated husband at the bedside Disposition Plan: Status is: Inpatient Remains inpatient appropriate because: Needs placement to skilled nursing facility     Consultants:    Procedures:    Antimicrobials:  Cefepime 11/24 >11/26 Vancomycin 11/24 >11/26   Subjective: Patient  noted to be confused this morning. She says she saw her daughter and that her daughter was speaking ill of her. Her husband is at bedside and says that patient is often confused at  home, comments on things that are not there, she is forgetful.   Objective: Vitals:   06/11/22 0742 06/11/22 1016 06/11/22 1034 06/11/22 1100  BP: 127/74 (!) 148/91  (!) 152/67  Pulse: 89 (!) 118 92 72  Resp: (!) 23 (!) 23 (!) 21 (!) 24  Temp: 98.5 F (36.9 C)     TempSrc: Axillary     SpO2: 100% 100% 100% 98%  Weight:      Height:        Intake/Output Summary (Last 24 hours) at 06/11/2022 1130 Last data filed at 06/11/2022 1036 Gross per 24 hour  Intake 1918.68 ml  Output 1000 ml  Net 918.68 ml   Filed Weights   06/09/22 2058  Weight: 78.5 kg    Examination:  General exam: Appears calm and comfortable  Respiratory system: Clear to auscultation. Respiratory effort normal. Cardiovascular system: S1 & S2 heard, RRR. No JVD, murmurs, rubs, gallops or clicks. No pedal edema. Gastrointestinal system: Abdomen is nondistended, soft and nontender. No organomegaly or masses felt. Normal bowel sounds heard. Central nervous system: Alert and oriented. No focal neurological deficits. Extremities: Symmetric 5 x 5 power. Skin: No rashes, lesions or ulcers Psychiatry: Judgement and insight appear normal. Mood & affect appropriate.     Data Reviewed: I have personally reviewed following labs and imaging studies  CBC: Recent Labs  Lab 06/09/22 1020 06/10/22 0319  WBC 6.0 8.1  NEUTROABS 3.7  --   HGB 11.6* 10.6*  HCT 36.2 33.5*  MCV 98.6 99.1  PLT 173 201   Basic Metabolic Panel: Recent Labs  Lab 06/09/22 1020 06/10/22 0319 06/11/22 0602  NA 140 142 135  K 3.2* 4.1 4.3  CL 102 116* 108  CO2 25 19* 19*  GLUCOSE 112* 130* 60*  BUN 31* 26* 28*  CREATININE 2.14* 1.65* 1.53*  CALCIUM 8.6* 8.0* 8.8*  PHOS  --   --  2.5   GFR: Estimated Creatinine Clearance: 32.3 mL/min (A) (by C-G formula based on SCr of 1.53 mg/dL (H)). Liver Function Tests: Recent Labs  Lab 06/09/22 1020 06/10/22 0319 06/11/22 0602  AST 22 19  --   ALT 11 11  --   ALKPHOS 128* 99  --    BILITOT 0.4 0.3  --   PROT 5.9* 5.3*  --   ALBUMIN 2.6* 2.3* 2.7*   No results for input(s): "LIPASE", "AMYLASE" in the last 168 hours. No results for input(s): "AMMONIA" in the last 168 hours. Coagulation Profile: Recent Labs  Lab 06/09/22 1020  INR 1.2   Cardiac Enzymes: No results for input(s): "CKTOTAL", "CKMB", "CKMBINDEX", "TROPONINI" in the last 168 hours. BNP (last 3 results) No results for input(s): "PROBNP" in the last 8760 hours. HbA1C: No results for input(s): "HGBA1C" in the last 72 hours. CBG: Recent Labs  Lab 06/11/22 0010 06/11/22 0343 06/11/22 0730 06/11/22 0751 06/11/22 1103  GLUCAP 179* 124* 59* 84 111*   Lipid Profile: No results for input(s): "CHOL", "HDL", "LDLCALC", "TRIG", "CHOLHDL", "LDLDIRECT" in the last 72 hours. Thyroid Function Tests: No results for input(s): "TSH", "T4TOTAL", "FREET4", "T3FREE", "THYROIDAB" in the last 72 hours. Anemia Panel: No results for input(s): "VITAMINB12", "FOLATE", "FERRITIN", "TIBC", "IRON", "RETICCTPCT" in the last 72 hours. Sepsis Labs: Recent Labs  Lab 06/09/22 1015 06/09/22 1219 06/09/22 1457  06/09/22 1752  LATICACIDVEN 3.0* 3.2* 1.6 1.3    Recent Results (from the past 240 hour(s))  Resp Panel by RT-PCR (Flu A&B, Covid) Anterior Nasal Swab     Status: None   Collection Time: 06/09/22 10:15 AM   Specimen: Anterior Nasal Swab  Result Value Ref Range Status   SARS Coronavirus 2 by RT PCR NEGATIVE NEGATIVE Final    Comment: (NOTE) SARS-CoV-2 target nucleic acids are NOT DETECTED.  The SARS-CoV-2 RNA is generally detectable in upper respiratory specimens during the acute phase of infection. The lowest concentration of SARS-CoV-2 viral copies this assay can detect is 138 copies/mL. A negative result does not preclude SARS-Cov-2 infection and should not be used as the sole basis for treatment or other patient management decisions. A negative result may occur with  improper specimen  collection/handling, submission of specimen other than nasopharyngeal swab, presence of viral mutation(s) within the areas targeted by this assay, and inadequate number of viral copies(<138 copies/mL). A negative result must be combined with clinical observations, patient history, and epidemiological information. The expected result is Negative.  Fact Sheet for Patients:  BloggerCourse.com  Fact Sheet for Healthcare Providers:  SeriousBroker.it  This test is no t yet approved or cleared by the [place] FDA and  has been authorized for detection and/or diagnosis of SARS-CoV-2 by FDA under an Emergency Use Authorization (EUA). This EUA will remain  in effect (meaning this test can be used) for the duration of the COVID-19 declaration under Section 564(b)(1) of the Act, 21 U.S.C.section 360bbb-3(b)(1), unless the authorization is terminated  or revoked sooner.       Influenza A by PCR NEGATIVE NEGATIVE Final   Influenza B by PCR NEGATIVE NEGATIVE Final    Comment: (NOTE) The Xpert Xpress SARS-CoV-2/FLU/RSV plus assay is intended as an aid in the diagnosis of influenza from Nasopharyngeal swab specimens and should not be used as a sole basis for treatment. Nasal washings and aspirates are unacceptable for Xpert Xpress SARS-CoV-2/FLU/RSV testing.  Fact Sheet for Patients: BloggerCourse.com  Fact Sheet for Healthcare Providers: SeriousBroker.it  This test is not yet approved or cleared by the [place] FDA and has been authorized for detection and/or diagnosis of SARS-CoV-2 by FDA under an Emergency Use Authorization (EUA). This EUA will remain in effect (meaning this test can be used) for the duration of the COVID-19 declaration under Section 564(b)(1) of the Act, 21 U.S.C. section 360bbb-3(b)(1), unless the authorization is terminated or revoked.  Performed at [facility], [address] [place], [place] [address]   Urine Culture     Status: None   Collection Time: 06/09/22 10:15 AM   Specimen: Urine, Catheterized  Result Value Ref Range Status   Specimen Description   Final    URINE, CATHETERIZED Performed at [facility], [address], [place], [place] [address]    Special Requests   Final    NONE Performed at [facility], [address], [place], [place] [address]    Culture   Final    NO GROWTH Performed at [facility], [address], [place], [place] [address]    Report Status 06/11/2022 FINAL  Final  Blood Culture (routine x 2)     Status: None (Preliminary result)   Collection Time: 06/09/22 10:20 AM   Specimen: BLOOD  Result Value Ref Range Status   Specimen Description BLOOD  Final   Special Requests RIGHT ANTECUBITAL Blood Culture adequate volume  Final   Culture  Final    NO GROWTH 2 DAYS Performed at [facility], [address] [place], [place] [address]    Report Status PENDING  Incomplete  Blood Culture (routine x 2)     Status: None (Preliminary result)   Collection Time: 06/09/22 10:38 AM   Specimen: BLOOD  Result Value Ref Range Status   Specimen Description BLOOD BOTTLES DRAWN AEROBIC ONLY  Final   Special Requests RIGHT ANTECUBITAL Blood Culture adequate volume  Final   Culture   Final    NO GROWTH 2 DAYS Performed at [facility], [address], [place], [place] [address]    Report Status PENDING  Incomplete  MRSA Next Gen by PCR, Nasal     Status: None   Collection Time: 06/09/22  3:31 PM   Specimen: Nasal Mucosa; Nasal Swab  Result Value Ref Range Status   MRSA by PCR Next Gen NOT DETECTED NOT DETECTED Final    Comment: (NOTE) The GeneXpert MRSA Assay (FDA approved for NASAL specimens only), is one component of a comprehensive MRSA colonization surveillance program. It is not intended to diagnose MRSA infection nor to guide or monitor treatment for MRSA infections. Test performance is not FDA  approved in patients less than 1 years old. Performed at [facility], [address] [place], [place] [address]          Radiology Studies: No results found.      Scheduled Meds:  apixaban  5 mg Oral BID   atorvastatin  80 mg Oral Daily   Chlorhexidine Gluconate Cloth  6 each Topical Daily   hydrocortisone sod succinate (SOLU-CORTEF) inj  50 mg Intravenous Q12H   insulin aspart  0-5 Units Subcutaneous QHS   insulin aspart  0-9 Units Subcutaneous TID WC   levothyroxine  25 mcg Oral Daily   pantoprazole  40 mg Oral BID   [START ON 06/12/2022] polyethylene glycol  17 g Oral Daily   [START ON 06/12/2022] predniSONE  40 mg Oral Q breakfast   Continuous Infusions:  sodium chloride 75 mL/hr at 06/11/22 1036   sodium chloride 20 mL/hr at 06/09/22 1900     LOS: 2 days    Time spent:    Erick Blinks, MD [facility]   If 7PM-7AM, please contact night-coverage [email or web address]  06/11/2022, 11:30 AM

## 2022-06-14 DIAGNOSIS — L8990 Pressure ulcer of unspecified site, unspecified stage: Secondary | ICD-10-CM | POA: Insufficient documentation

## 2022-06-14 DIAGNOSIS — J181 Lobar pneumonia, unspecified organism: Secondary | ICD-10-CM | POA: Insufficient documentation

## 2022-06-14 DIAGNOSIS — N1832 Chronic kidney disease, stage 3b: Secondary | ICD-10-CM

## 2022-06-14 LAB — CBC
HCT: 30.7 % — ABNORMAL LOW (ref 36.0–46.0)
Hemoglobin: 10.6 g/dL — ABNORMAL LOW (ref 12.0–15.0)
MCH: 31.8 pg (ref 26.0–34.0)
MCHC: 34.5 g/dL (ref 30.0–36.0)
MCV: 92.2 fL (ref 80.0–100.0)
Platelets: 156 10*3/uL (ref 150–400)
RBC: 3.33 MIL/uL — ABNORMAL LOW (ref 3.87–5.11)
RDW: 14.1 % (ref 11.5–15.5)
WBC: 6 10*3/uL (ref 4.0–10.5)
nRBC: 0 % (ref 0.0–0.2)

## 2022-06-14 LAB — CULTURE, BLOOD (ROUTINE X 2)
Culture: NO GROWTH
Culture: NO GROWTH
Special Requests: ADEQUATE
Special Requests: ADEQUATE

## 2022-06-14 LAB — COMPREHENSIVE METABOLIC PANEL
ALT: 13 U/L (ref 0–44)
AST: 16 U/L (ref 15–41)
Albumin: 2.3 g/dL — ABNORMAL LOW (ref 3.5–5.0)
Alkaline Phosphatase: 78 U/L (ref 38–126)
Anion gap: 6 (ref 5–15)
BUN: 20 mg/dL (ref 8–23)
CO2: 23 mmol/L (ref 22–32)
Calcium: 8.7 mg/dL — ABNORMAL LOW (ref 8.9–10.3)
Chloride: 108 mmol/L (ref 98–111)
Creatinine, Ser: 1.14 mg/dL — ABNORMAL HIGH (ref 0.44–1.00)
GFR, Estimated: 51 mL/min — ABNORMAL LOW (ref >60)
Glucose, Bld: 155 mg/dL — ABNORMAL HIGH (ref 70–99)
Potassium: 3.8 mmol/L (ref 3.5–5.1)
Sodium: 137 mmol/L (ref 135–145)
Total Bilirubin: 0.5 mg/dL (ref 0.3–1.2)
Total Protein: 5.6 g/dL — ABNORMAL LOW (ref 6.5–8.1)

## 2022-06-14 LAB — GLUCOSE, CAPILLARY
Glucose-Capillary: 126 mg/dL — ABNORMAL HIGH (ref 70–99)
Glucose-Capillary: 100 mg/dL — ABNORMAL HIGH (ref 70–99)

## 2022-06-14 MED ORDER — ENALAPRIL MALEATE 5 MG PO TABS: 5.0000 mg | ORAL_TABLET | Freq: Every evening | ORAL | Status: DC

## 2022-06-14 MED ORDER — OXYCODONE-ACETAMINOPHEN 5-325 MG PO TABS: 1.0000 | ORAL_TABLET | Freq: Three times a day (TID) | ORAL | 0 refills | Status: DC | PRN

## 2022-06-14 MED ORDER — AZITHROMYCIN 500 MG PO TABS: 500.0000 mg | ORAL_TABLET | Freq: Every day | ORAL | 0 refills | Status: DC

## 2022-06-14 MED ORDER — CEFDINIR 300 MG PO CAPS
300.0000 mg | ORAL_CAPSULE | Freq: Two times a day (BID) | ORAL | Status: DC
  Administered 2022-06-14: 300 mg via ORAL
  Filled 2022-06-14: qty 1

## 2022-06-14 MED ORDER — CEFDINIR 300 MG PO CAPS: 300.0000 mg | ORAL_CAPSULE | Freq: Two times a day (BID) | ORAL | Status: DC

## 2022-06-14 MED ORDER — AZITHROMYCIN 500 MG PO TABS: 500.0000 mg | ORAL_TABLET | Freq: Every day | ORAL | Status: DC

## 2022-06-14 MED ORDER — AZITHROMYCIN 250 MG PO TABS
500.0000 mg | ORAL_TABLET | Freq: Every day | ORAL | Status: DC
  Administered 2022-06-14: 500 mg via ORAL
  Filled 2022-06-14: qty 2

## 2022-06-14 MED ORDER — CLONAZEPAM 1 MG PO TABS: 1.0000 mg | ORAL_TABLET | Freq: Every day | ORAL | 0 refills | Status: DC

## 2022-06-14 MED ORDER — ENALAPRIL MALEATE 5 MG PO TABS: 5.0000 mg | ORAL_TABLET | Freq: Every evening | ORAL | 1 refills | Status: AC

## 2022-06-14 MED ORDER — CEFDINIR 300 MG PO CAPS: 300.0000 mg | ORAL_CAPSULE | Freq: Two times a day (BID) | ORAL | 0 refills | Status: DC

## 2022-06-14 NOTE — Progress Notes (Signed)
Patient slept well throughout the night, no c/o of pain or discomfort voiced. No agitation behaviors  exhibited last night. Patient was incontinent of bladder with incontinence care rendered. Urine still needed for collection. Patient is currently resting quietly in bed watching TV with call bell in reach.

## 2022-06-14 NOTE — Progress Notes (Signed)
Occupational Therapy Treatment Patient Details Name: Megan [name] MRN: [id number] DOB: 1950/12/12 Today's Date: 06/14/2022   History of present illness 71 y.o. F admitted on 06/09/22 due to increased lethargy. Upon admission pt was found to be hypothermic and hypotensive. Undergoing sepsis work up at this time. PMH significant for paroxysmal A-fib on anticoagulation, chronic pain syndrome on opiates, diabetes, hypertension, hypothyroidism, prior stroke, anxiety on benzodiazepines, chronic steroid use for arthritis.   OT comments  Pt agreeable to OT treatment. Pt mostly operating at level of supervision to min G assist for standing ADL tasks. Much improved lower body dressing with modified independence today. Min A needed to boost from toilet using the grab bar to L side and RW. Overall pt is progressing well. SNF still recommended due to need to assist husband at baseline. Pt left in chair with call bell within reach. Pt will benefit from continued OT in the hospital and recommended venue below to increase strength, balance, and endurance for safe ADL's.      Recommendations for follow up therapy are one component of a multi-disciplinary discharge planning process, led by the attending physician.  Recommendations may be updated based on patient status, additional functional criteria and insurance authorization.    Follow Up Recommendations  Skilled nursing-short term rehab (<3 hours/day)     Assistance Recommended at Discharge Frequent or constant Supervision/Assistance  Patient can return home with the following  A little help with walking and/or transfers;A lot of help with bathing/dressing/bathroom;Assistance with cooking/housework;Direct supervision/assist for medications management;Direct supervision/assist for financial management;Help with stairs or ramp for entrance   Equipment Recommendations  None recommended by OT    Recommendations for Other Services      Precautions /  Restrictions Precautions Precautions: Fall Restrictions Weight Bearing Restrictions: No       Mobility Bed Mobility Overal bed mobility: Modified Independent             General bed mobility comments: Using railing to pull/push to sit.    Transfers Overall transfer level: Needs assistance Equipment used: Rolling walker (2 wheels) Transfers: Sit to/from Stand, Bed to chair/wheelchair/BSC Sit to Stand: Min guard, Min assist     Step pivot transfers: Supervision, Min guard     General transfer comment: Pt mostly at level of supervision to min G assist other than slight assist to boost from toilet with RW.     Balance Overall balance assessment: Needs assistance Sitting-balance support: Feet supported, No upper extremity supported Sitting balance-Leahy Scale: Good Sitting balance - Comments: seated EOB   Standing balance support: Bilateral upper extremity supported, Reliant on assistive device for balance, During functional activity Standing balance-Leahy Scale: Fair Standing balance comment: with RW                           ADL either performed or assessed with clinical judgement   ADL Overall ADL's : Needs assistance/impaired     Grooming: Standing;Supervision/safety;Wash/dry face               Lower Body Dressing: Independent;Sitting/lateral leans Lower Body Dressing Details (indicate cue type and reason): Pt able to doff and don socks indepndently at EOB today. Toilet Transfer: Min guard;Rolling walker (2 wheels);Ambulation;Minimal assistance Toilet Transfer Details (indicate cue type and reason): Pt able to ambulate to the toilet with RW. Slight assist to boost from toilet with use of grab bars and RW.         Functional mobility during ADLs:  Supervision/safety;Min guard;Rolling walker (2 wheels)        Cognition Arousal/Alertness: Awake/alert Behavior During Therapy: WFL for tasks assessed/performed Overall Cognitive Status: Within  Functional Limits for tasks assessed                                                     Pertinent Vitals/ Pain       Pain Assessment Pain Assessment: No/denies pain                                                          Frequency  Min 1X/week        Progress Toward Goals  OT Goals(current goals can now be found in the care plan section)  Progress towards OT goals: Progressing toward goals  Acute Rehab OT Goals Patient Stated Goal: To go home OT Goal Formulation: With patient Time For Goal Achievement: 06/26/22 Potential to Achieve Goals: Good ADL Goals Pt Will Perform Grooming: with modified independence;standing Pt Will Perform Lower Body Bathing: with supervision;sit to/from stand;sitting/lateral leans Pt Will Perform Lower Body Dressing: with supervision;sitting/lateral leans;sit to/from stand Pt Will Transfer to Toilet: with supervision;ambulating Pt Will Perform Toileting - Clothing Manipulation and hygiene: with supervision;sitting/lateral leans;sit to/from stand;with adaptive equipment  Plan Discharge plan remains appropriate                                    End of Session Equipment Utilized During Treatment: Rolling walker (2 wheels)  OT Visit Diagnosis: Unsteadiness on feet (R26.81);Other abnormalities of gait and mobility (R26.89);Muscle weakness (generalized) (M62.81)   Activity Tolerance Patient tolerated treatment well   Patient Left in chair;with call bell/phone within reach   Nurse Communication          Time: 2010-0712 OT Time Calculation (min): 17 min  Charges: OT General Charges $OT Visit: 1 Visit OT Treatments $Self Care/Home Management : 8-22 mins  Tyreece Gelles OT, MOT  Danie Chandler 06/14/2022, 10:43 AM

## 2022-06-14 NOTE — TOC Transition Note (Signed)
Transition of Care [facility]) - CM/SW Discharge Note   Patient Details  Name: Megan [name] MRN: [id number] Date of Birth: 01-08-1951  [facility] of [facility]) CM/SW Contact:  Karn Cassis, LCSW Phone Number: 06/14/2022, 2:38 PM   Clinical Narrative: Pt d/c today. Discussed d/c to [facility] and pt states she has decided to return home. Pt's husband aware. Agreeable to home health PT, OT, RN with no preference on agency. Referred and accepted by Misty Stanley with [facility]. Revonda Standard at [facility] aware pt is now going home. Home health orders in.         Final next level of care: Home w Home Health Services Barriers to Discharge: Barriers Resolved   Patient Goals and CMS Choice     Choice offered to / list presented to : Patient, Spouse  Discharge Placement                  Name of family member notified: husband Patient and family notified of of transfer: 06/14/22  Discharge Plan and Services     Post Acute Care Choice: Skilled Nursing Facility                    HH Arranged: PT, RN, OT [facility] Agency: [facility] Date [facility] Agency Contacted: 06/14/22 Time HH Agency Contacted: [phone] Representative spoke with at [facility] Agency: Misty Stanley  Social Determinants of Health (SDOH) Interventions     Readmission Risk Interventions     No data to display

## 2022-06-14 NOTE — Discharge Summary (Signed)
Physician Discharge Summary   Patient: Megan [name] MRN: [id number] DOB: 04-27-1951  Admit date:     06/09/2022  Discharge date: 06/14/22  Discharge Physician: Onalee Hua Leopold Smyers   PCP: [name], MD   Recommendations at discharge:   Please follow up with primary care provider within 1-2 weeks  Please repeat BMP and CBC in one week    Hospital Course: 71 year old female admitted to the hospital with increasing lethargy. Found to be hypothermic, hypotensive on arrival. Possible UTI, dehydration and AKI. Admitted for sepsis with septic shock. Aggressively hydrated and required vasopressor support on admission. She is on IV antibiotics. Appears to be clinically improving.  She was subsequently fluid resuscitated and started on Levophed initially.  She was weaned off of Levophed.  Vancomycin and cefepime were initially started.  Cultures from her urine and blood were negative despite having some pyuria initially.  The patient was started on intravenous dose of hydrocortisone since she was on chronic prednisone.  Her sepsis physiology resolved.  She was subsequently transition back to prednisone taper.  Chest x-ray showed possible left lower lobe opacity.  She was subsequently transitioned to cefdinir and azithromycin for 3 additional days after discharge.  Assessment and Plan: Septic shock -Patient received 3 to 4 L of IV fluids in the emergency room, despite aggressive IV hydration she remained hypotensive -She was hypothermic, hypotensive, elevated lactate (3.0 on admission), acute kidney injury and mental status changes on admission -Blood cultures and urine cultures have shown no growth -Initially started on cefepime and vancomycin -Started on stress dose steroids with IV hydrocortisone since she is on chronic she was transition back over to prednisone taper.  This has since been transitioned to prednisone taper. -Lactic acid has now normalized -IV fluids have been discontinued -She  has been weaned off of Levophed -Since cultures have shown no growth, she does not appear toxic and is clinically improving, -sepsis physiology resolved  Lobar pneumonia -personally reviewed CXR>>LLL opacity -d/c with 3 more days cefdinir and azithro   Acute kidney injury on chronic kidney disease stage IIIb -Baseline creatinine appears to be around 1.4 -Admission creatinine noted to be 2.1 -Likely related to hypotension, volume depletion -Improving with IV fluids, current creatinine 1.14 on day of dc -Continue to hold home dose of enalapril and Lasix>>resume after dc   Atrial fibrillation -Continue on apixaban -Resume metoprolol for rate control -Heart rate currently stable   Diabetes mellitus type 2, controlled -03/30/22 A1C--6.8 -Holding home dose of metformin as well as Jardiance>>restart -Started on sliding scale insulin -Blood sugars currently stable   Acute metabolic encephalopathy -Likely related to hypotension and sepsis -Overall mental status appears to be back to baseline -11/28, she was noted to be more confused/agitated -husband reports that this frequently occurs at home as well -patient likely has underlying cognitive impairment -ammonia 34 -restart QHS klonopin, since she says she has not slept well in several nights and she uses it at home to help her sleep -she has trazodone on her home list for sleep, but says she has not used that in quite some time. -overall improved and back to baseline on day of d/c   Hypothyroidism -Continue Synthroid -TSH 0.44   Hypokalemia -Replaced   Hypomagnesemia -Replace   Chronic pain -Resume low-dose opiates for pain now that mental status has improved   Anxiety -Resume home dose of Klonopin. She says she only takes it at night   Generalized weakness Recurrent falls -PT/OT evaluated patient with recommendations for SNF placement  Goals of care -Patient agrees to DNR status, but wishes to continue with all other  available treatments -Plan to discharge to skilled nursing facility     Pain control - [facility] Controlled Substance Reporting System database was reviewed. and patient was instructed, not to drive, operate heavy machinery, perform activities at heights, swimming or participation in water activities or provide baby-sitting services while on Pain, Sleep and Anxiety Medications; until their outpatient Physician has advised to do so again. Also recommended to not to take more than prescribed Pain, Sleep and Anxiety Medications.  Consultants: none Procedures performed: none  Disposition:SNF--[facility] Diet recommendation:  Carb modified diet DISCHARGE MEDICATION: Allergies as of 06/14/2022       Reactions   Aleve [naproxen] Other (See Comments)   Stomach pain        Medication List     STOP taking these medications    pioglitazone 30 MG tablet Commonly known as: ACTOS   polyethylene glycol 17 g packet Commonly known as: MiraLax   VITAMIN D-3 PO       TAKE these medications    acetaminophen 325 MG tablet Commonly known as: Tylenol Take 2 tablets (650 mg total) by mouth every 6 (six) hours as needed for mild pain, moderate pain or headache.   apixaban 5 MG Tabs tablet Commonly known as: Eliquis Take 1 tablet (5 mg total) by mouth 2 (two) times daily. Restart on 7/14 What changed: additional instructions   atorvastatin 80 MG tablet Commonly known as: LIPITOR Take 80 mg by mouth every evening.   azithromycin 500 MG tablet Commonly known as: ZITHROMAX Take 1 tablet (500 mg total) by mouth daily. X 3 days   B-12 PO Take 1 tablet by mouth every evening.   cefdinir 300 MG capsule Commonly known as: OMNICEF Take 1 capsule (300 mg total) by mouth every 12 (twelve) hours. X 3 days   clonazePAM 1 MG tablet Commonly known as: KLONOPIN Take 1 mg by mouth 2 (two) times daily as needed for anxiety (panic attack).   enalapril 5 MG tablet Commonly known as:  VASOTEC Take 1 tablet (5 mg total) by mouth every evening. What changed:  medication strength how much to take   escitalopram 20 MG tablet Commonly known as: LEXAPRO Take 20 mg by mouth at bedtime.   fluticasone 50 MCG/ACT nasal spray Commonly known as: FLONASE Place 2 sprays into both nostrils daily as needed for allergies.   furosemide 40 MG tablet Commonly known as: LASIX Take 40-80 mg by mouth daily as needed for edema or fluid.   glipiZIDE 5 MG tablet Commonly known as: GLUCOTROL Take 5 mg by mouth every evening.   hydrochlorothiazide 25 MG tablet Commonly known as: HYDRODIURIL Take 25 mg by mouth in the morning.   Jardiance 10 MG Tabs tablet Generic drug: empagliflozin Take 10 mg by mouth every morning.   lactulose 10 GM/15ML solution Commonly known as: CHRONULAC Take 10 g by mouth 2 (two) times daily.   levothyroxine 25 MCG tablet Commonly known as: SYNTHROID Take 25 mcg by mouth in the morning.   metFORMIN 500 MG tablet Commonly known as: GLUCOPHAGE Take 1,000 mg by mouth 2 (two) times daily.   metoprolol succinate 50 MG 24 hr tablet Commonly known as: TOPROL-XL Take 1 tablet (50 mg total) by mouth daily with lunch. What changed: when to take this   ondansetron 4 MG tablet Commonly known as: ZOFRAN Take 4 mg by mouth every 8 (eight) hours as  needed for vomiting or nausea.   oxyCODONE-acetaminophen 5-325 MG tablet Commonly known as: PERCOCET/ROXICET Take 1 tablet by mouth 3 (three) times daily as needed for severe pain.   pantoprazole 40 MG tablet Commonly known as: PROTONIX Take 40 mg by mouth 2 (two) times daily.   potassium chloride 10 MEQ tablet Commonly known as: KLOR-CON Take 10 mEq by mouth 2 (two) times daily.   predniSONE 5 MG tablet Commonly known as: DELTASONE Take 5 mg by mouth in the morning.   senna-docusate 8.6-50 MG tablet Commonly known as: Senokot-S Take 2 tablets by mouth at bedtime.   traZODone 100 MG tablet Commonly  known as: DESYREL Take 1 tablet (100 mg total) by mouth at bedtime. For sleep and Mood What changed:  when to take this reasons to take this additional instructions        Contact information for after-discharge care     Destination     [facility] Preferred SNF .   Service: Skilled Nursing Contact information: [address] [place] [address] [phone]                    Discharge Exam: Ceasar Mons Weights   06/09/22 2058 06/11/22 2213  Weight: 78.5 kg 83.5 kg   HEENT:  [place]/AT, No thrush, no icterus CV:  RRR, no rub, no S3, no S4 Lung:  left base rales.  R-CTA Abd:  soft/+BS, NT Ext:  No edema, no lymphangitis, no synovitis, no rash   Condition at discharge: stable  The results of significant diagnostics from this hospitalization (including imaging, microbiology, ancillary and laboratory) are listed below for reference.   Imaging Studies: DG Chest Port 1 View  Result Date: 06/09/2022 CLINICAL DATA:  Questionable sepsis - evaluate for abnormality EXAM: PORTABLE CHEST 1 VIEW COMPARISON:  Radiograph 04/14/2022 FINDINGS: Unchanged cardiomegaly. Left medial basilar opacity. Right lung is clear. No large pleural effusion. No evidence of pneumothorax. Thoracic spondylosis. No acute osseous abnormality. IMPRESSION: Left medial basilar opacity, could be atelectasis or developing infection. Electronically Signed   By: Caprice Renshaw M.D.   On: 06/09/2022 10:34    Microbiology: Results for orders placed or performed during the hospital encounter of 06/09/22  Resp Panel by RT-PCR (Flu A&B, Covid) Anterior Nasal Swab     Status: None   Collection Time: 06/09/22 10:15 AM   Specimen: Anterior Nasal Swab  Result Value Ref Range Status   SARS Coronavirus 2 by RT PCR NEGATIVE NEGATIVE Final    Comment: (NOTE) SARS-CoV-2 target nucleic acids are NOT DETECTED.  The SARS-CoV-2 RNA is generally detectable in upper  respiratory specimens during the acute phase of infection. The lowest concentration of SARS-CoV-2 viral copies this assay can detect is 138 copies/mL. A negative result does not preclude SARS-Cov-2 infection and should not be used as the sole basis for treatment or other patient management decisions. A negative result may occur with  improper specimen collection/handling, submission of specimen other than nasopharyngeal swab, presence of viral mutation(s) within the areas targeted by this assay, and inadequate number of viral copies(<138 copies/mL). A negative result must be combined with clinical observations, patient history, and epidemiological information. The expected result is Negative.  Fact Sheet for Patients:  BloggerCourse.com  Fact Sheet for Healthcare Providers:  SeriousBroker.it  This test is no t yet approved or cleared by the [place] FDA and  has been authorized for detection and/or diagnosis of SARS-CoV-2 by FDA under an Emergency Use Authorization (  EUA). This EUA will remain  in effect (meaning this test can be used) for the duration of the COVID-19 declaration under Section 564(b)(1) of the Act, 21 U.S.C.section 360bbb-3(b)(1), unless the authorization is terminated  or revoked sooner.       Influenza A by PCR NEGATIVE NEGATIVE Final   Influenza B by PCR NEGATIVE NEGATIVE Final    Comment: (NOTE) The Xpert Xpress SARS-CoV-2/FLU/RSV plus assay is intended as an aid in the diagnosis of influenza from Nasopharyngeal swab specimens and should not be used as a sole basis for treatment. Nasal washings and aspirates are unacceptable for Xpert Xpress SARS-CoV-2/FLU/RSV testing.  Fact Sheet for Patients: BloggerCourse.comhttps://www.fda.gov/media/152166/download  Fact Sheet for Healthcare Providers: SeriousBroker.ithttps://www.fda.gov/media/152162/download  This test is not yet approved or cleared by the [place] FDA and has been  authorized for detection and/or diagnosis of SARS-CoV-2 by FDA under an Emergency Use Authorization (EUA). This EUA will remain in effect (meaning this test can be used) for the duration of the COVID-19 declaration under Section 564(b)(1) of the Act, 21 U.S.C. section 360bbb-3(b)(1), unless the authorization is terminated or revoked.  Performed at [facility], [address] [place], [place] [address]   Urine Culture     Status: None   Collection Time: 06/09/22 10:15 AM   Specimen: Urine, Catheterized  Result Value Ref Range Status   Specimen Description   Final    URINE, CATHETERIZED Performed at [facility], [address] [place], [place] [address]    Special Requests   Final    NONE Performed at [facility], [address] [place], [place] [address]    Culture   Final    NO GROWTH Performed at [facility], [address] [place], [place] [address]    Report Status 06/11/2022 FINAL  Final  Blood Culture (routine x 2)     Status: None (Preliminary result)   Collection Time: 06/09/22 10:20 AM   Specimen: BLOOD  Result Value Ref Range Status   Specimen Description BLOOD  Final   Special Requests RIGHT ANTECUBITAL Blood Culture adequate volume  Final   Culture   Final    NO GROWTH 3 DAYS Performed at [facility], [address] [place], [place] [address]    Report Status PENDING  Incomplete  Blood Culture (routine x 2)     Status: None (Preliminary result)   Collection Time: 06/09/22 10:38 AM   Specimen: BLOOD  Result Value Ref Range Status   Specimen Description BLOOD BOTTLES DRAWN AEROBIC ONLY  Final   Special Requests RIGHT ANTECUBITAL Blood Culture adequate volume  Final   Culture   Final    NO GROWTH 3 DAYS Performed at [facility], [address] [place], [place] [address]    Report Status PENDING  Incomplete  MRSA Next Gen by PCR, Nasal     Status: None   Collection Time: 06/09/22  3:31 PM   Specimen: Nasal Mucosa; Nasal Swab  Result Value Ref  Range Status   MRSA by PCR Next Gen NOT DETECTED NOT DETECTED Final    Comment: (NOTE) The GeneXpert MRSA Assay (FDA approved for NASAL specimens only), is one component of a comprehensive MRSA colonization surveillance program. It is not intended to diagnose MRSA infection nor to guide or monitor treatment for MRSA infections. Test performance is not FDA approved in patients less than 72 years old. Performed at [facility], [address] [place], [place] [address]     Labs: CBC: Recent Labs  Lab 06/09/22 1020 06/10/22 0319 06/13/22 0330 06/14/22 0419  WBC 6.0 8.1 6.5 6.0  NEUTROABS 3.7  --   --   --   HGB 11.6* 10.6* 10.7* 10.6*  HCT 36.2 33.5* 32.2* 30.7*  MCV 98.6 99.1 93.6 92.2  PLT 173 201 165 156   Basic Metabolic Panel: Recent Labs  Lab 06/10/22 0319 06/11/22 0602 06/12/22 0424 06/13/22 0330 06/13/22 2014 06/14/22 0419  NA 142 135 133* 131*  --  137  K 4.1 4.3 3.4* 4.2  --  3.8  CL 116* 108 108 105  --  108  CO2 19* 19* 19* 22  --  23  GLUCOSE 130* 60* 177* 181*  --  155*  BUN 26* 28* 27* 22  --  20  CREATININE 1.65* 1.53* 1.40* 1.14*  --  1.14*  CALCIUM 8.0* 8.8* 8.5* 8.6*  --  8.7*  MG  --   --  1.4* 2.0  --   --   PHOS  --  2.5  --  1.6* 4.4  --    Liver Function Tests: Recent Labs  Lab 06/09/22 1020 06/10/22 0319 06/11/22 0602 06/14/22 0419  AST 22 19  --  16  ALT 11 11  --  13  ALKPHOS 128* 99  --  78  BILITOT 0.4 0.3  --  0.5  PROT 5.9* 5.3*  --  5.6*  ALBUMIN 2.6* 2.3* 2.7* 2.3*   CBG: Recent Labs  Lab 06/13/22 1154 06/13/22 1652 06/13/22 2007 06/14/22 0740 06/14/22 1101  GLUCAP 211* 280* 228* 126* 100*    Discharge time spent: greater than 30 minutes.  Signed: Catarina Hartshorn, MD [facility] 06/14/2022

## 2022-06-14 NOTE — Progress Notes (Signed)
Physical Therapy Treatment Patient Details Name: Megan [name] MRN: [id number] DOB: [birth year] Today's Date: 06/14/2022   History of Present Illness 71 y.o. F admitted on 06/09/22 due to increased lethargy. Upon admission pt was found to be hypothermic and hypotensive. Undergoing sepsis work up at this time. PMH significant for paroxysmal A-fib on anticoagulation, chronic pain syndrome on opiates, diabetes, hypertension, hypothyroidism, prior stroke, anxiety on benzodiazepines, chronic steroid use for arthritis.    PT Comments    Pt sitting up in chair, eager to participate with therapy this morning.  Pt completed seated therex with cues for hold times, assistance to keep count of repetitions.   Pt required min assist to stand from chair due to LE weakness, cues to use UE to assist to push rather than pull.  Pt completed longer walking distance, however slow gait, short steps, maintaining body outside of walker BOS.  Cues needed to improve gait quality and maintain upright posturing.  Pt returned to chair following indicating she was fatigued from activity.  Pt will continue to benefit from skilled therapy to improve overall strength and function.    Recommendations for follow up therapy are one component of a multi-disciplinary discharge planning process, led by the attending physician.  Recommendations may be updated based on patient status, additional functional criteria and insurance authorization.     Precautions / Restrictions Precautions Precautions: Fall Restrictions Weight Bearing Restrictions: No     Mobility  Bed Mobility                    Transfers Overall transfer level: Needs assistance Equipment used: Rolling walker (2 wheels) Transfers: Sit to/from Stand, Bed to chair/wheelchair/BSC Sit to Stand: Min guard, Min assist   Step pivot transfers: Supervision, Min guard       General transfer comment: Pt mostly at level of supervision to min G assist other  than slight assist to boost to stand, LE's weak    Ambulation/Gait Ambulation/Gait assistance: Supervision, Min guard Gait Distance (Feet): 80 Feet Assistive device: Rolling walker (2 wheels) Gait Pattern/deviations: Step-to pattern, Decreased step length - left, Decreased stance time - right, Trunk flexed, Narrow base of support       General Gait Details: increased time to complete distance, decreased stride, cues to maintain within walker BOS, forward flexed         Cognition Arousal/Alertness: Awake/alert Behavior During Therapy: WFL for tasks assessed/performed Overall Cognitive Status: Within Functional Limits for tasks assessed                                          Exercises General Exercises - Lower Extremity Ankle Circles/Pumps: AROM, 10 reps, Seated Long Arc Quad: AROM, Both, 10 reps, Seated Hip Flexion/Marching: AROM, Both, 10 reps, Seated        Prior Function            PT Goals (current goals can now be found in the care plan section) Acute Rehab PT Goals Patient Stated Goal: return home after rehab PT Goal Formulation: With patient Time For Goal Achievement: 06/26/22 Potential to Achieve Goals: Good       PT Plan  Continue to progress towards goals.        AM-PAC PT "6 Clicks" Mobility   Outcome Measure  Help needed turning from your back to your side while in a flat bed without using bedrails?: None  Help needed moving from lying on your back to sitting on the side of a flat bed without using bedrails?: A Little Help needed moving to and from a bed to a chair (including a wheelchair)?: A Little Help needed standing up from a chair using your arms (e.g., wheelchair or bedside chair)?: A Little Help needed to walk in hospital room?: A Little Help needed climbing 3-5 steps with a railing? : A Lot 6 Click Score: 18    End of Session Equipment Utilized During Treatment: Gait belt Activity Tolerance: Patient tolerated treatment  well;Patient limited by fatigue Patient left: in chair;with call bell/phone within reach Nurse Communication: Mobility status PT Visit Diagnosis: Unsteadiness on feet (R26.81);Other abnormalities of gait and mobility (R26.89);Muscle weakness (generalized) (M62.81)     Time: 8850-2774 PT Time Calculation (min) (ACUTE ONLY): 25 min  Charges:  $Gait Training: 8-22 mins $Therapeutic Exercise: 8-22 mins                     Lurena Nida, PTA/CLT [facility] Ph: [phone]   Lurena Nida 06/14/2022, 11:03 AM

## 2022-07-24 ENCOUNTER — Emergency Department (HOSPITAL_COMMUNITY): Payer: 59

## 2022-07-24 ENCOUNTER — Emergency Department (HOSPITAL_COMMUNITY)
Admission: EM | Admit: 2022-07-24 | Discharge: 2022-07-24 | Disposition: A | Discharge status: Home / Self Care | Payer: 59 | Attending: Student | Admitting: Student

## 2022-07-24 ENCOUNTER — Encounter (HOSPITAL_COMMUNITY): Payer: Self-pay | Admitting: Emergency Medicine

## 2022-07-24 ENCOUNTER — Other Ambulatory Visit: Payer: Self-pay

## 2022-07-24 DIAGNOSIS — Z7901 Long term (current) use of anticoagulants: Secondary | ICD-10-CM | POA: Insufficient documentation

## 2022-07-24 DIAGNOSIS — I10 Essential (primary) hypertension: Secondary | ICD-10-CM | POA: Insufficient documentation

## 2022-07-24 DIAGNOSIS — T402X1A Poisoning by other opioids, accidental (unintentional), initial encounter: Secondary | ICD-10-CM | POA: Diagnosis not present

## 2022-07-24 DIAGNOSIS — Z79899 Other long term (current) drug therapy: Secondary | ICD-10-CM | POA: Insufficient documentation

## 2022-07-24 DIAGNOSIS — Z7984 Long term (current) use of oral hypoglycemic drugs: Secondary | ICD-10-CM | POA: Diagnosis not present

## 2022-07-24 DIAGNOSIS — E039 Hypothyroidism, unspecified: Secondary | ICD-10-CM | POA: Insufficient documentation

## 2022-07-24 DIAGNOSIS — E119 Type 2 diabetes mellitus without complications: Secondary | ICD-10-CM | POA: Diagnosis not present

## 2022-07-24 LAB — COMPREHENSIVE METABOLIC PANEL
ALT: 10 U/L (ref 0–44)
AST: 19 U/L (ref 15–41)
Albumin: 2.9 g/dL — ABNORMAL LOW (ref 3.5–5.0)
Alkaline Phosphatase: 78 U/L (ref 38–126)
Anion gap: 7 (ref 5–15)
BUN: 27 mg/dL — ABNORMAL HIGH (ref 8–23)
CO2: 25 mmol/L (ref 22–32)
Calcium: 9.1 mg/dL (ref 8.9–10.3)
Chloride: 103 mmol/L (ref 98–111)
Creatinine, Ser: 1.57 mg/dL — ABNORMAL HIGH (ref 0.44–1.00)
GFR, Estimated: 35 mL/min — ABNORMAL LOW (ref >60)
Glucose, Bld: 86 mg/dL (ref 70–99)
Potassium: 4.6 mmol/L (ref 3.5–5.1)
Sodium: 135 mmol/L (ref 135–145)
Total Bilirubin: 0.2 mg/dL — ABNORMAL LOW (ref 0.3–1.2)
Total Protein: 6.4 g/dL — ABNORMAL LOW (ref 6.5–8.1)

## 2022-07-24 LAB — CBC WITH DIFFERENTIAL/PLATELET
Abs Immature Granulocytes: 0.01 10*3/uL (ref 0.00–0.07)
Basophils Absolute: 0 10*3/uL (ref 0.0–0.1)
Basophils Relative: 0 %
Eosinophils Absolute: 0.1 10*3/uL (ref 0.0–0.5)
Eosinophils Relative: 2 %
HCT: 33.3 % — ABNORMAL LOW (ref 36.0–46.0)
Hemoglobin: 10 g/dL — ABNORMAL LOW (ref 12.0–15.0)
Immature Granulocytes: 0 %
Lymphocytes Relative: 38 %
Lymphs Abs: 1.9 10*3/uL (ref 0.7–4.0)
MCH: 31.1 pg (ref 26.0–34.0)
MCHC: 30 g/dL (ref 30.0–36.0)
MCV: 103.4 fL — ABNORMAL HIGH (ref 80.0–100.0)
Monocytes Absolute: 0.3 10*3/uL (ref 0.1–1.0)
Monocytes Relative: 6 %
Neutro Abs: 2.8 10*3/uL (ref 1.7–7.7)
Neutrophils Relative %: 54 %
Platelets: 162 10*3/uL (ref 150–400)
RBC: 3.22 MIL/uL — ABNORMAL LOW (ref 3.87–5.11)
RDW: 15.2 % (ref 11.5–15.5)
WBC: 5.1 10*3/uL (ref 4.0–10.5)
nRBC: 0 % (ref 0.0–0.2)

## 2022-07-24 LAB — CBG MONITORING, ED
Glucose-Capillary: 65 mg/dL — ABNORMAL LOW (ref 70–99)
Glucose-Capillary: 98 mg/dL (ref 70–99)

## 2022-07-24 LAB — AMMONIA: Ammonia: 17 umol/L (ref 9–35)

## 2022-07-24 LAB — ETHANOL: Alcohol, Ethyl (B): <10 mg/dL (ref <10)

## 2022-07-24 MED ORDER — NALOXONE HCL 0.4 MG/ML IJ SOLN
0.4000 mg | Freq: Once | INTRAMUSCULAR | Status: AC
  Administered 2022-07-24: 0.4 mg via INTRAVENOUS
  Filled 2022-07-24: qty 1

## 2022-07-24 MED ORDER — DEXTROSE 50 % IV SOLN
50.0000 mL | Freq: Once | INTRAVENOUS | Status: AC
  Administered 2022-07-24: 50 mL via INTRAVENOUS
  Filled 2022-07-24: qty 50

## 2022-07-24 NOTE — ED Provider Notes (Signed)
[facility] EMERGENCY DEPARTMENT Provider Note   CSN: [id number] Arrival date & time: 07/24/22  1344     History  Chief Complaint  Patient presents with   "sleepy"    Megan [name] is a 72 y.o. female.  Patient with history of diabetes, CVA, GERD, hypothyrodism, hypertension presents today with complaints of altered mental status. Patients husband presents at bedside states that patient was at her baseline when they went to bed last night. States that she got up this morning before he did and when he woke up he was unable to arouse her. He states that she has not had any complaints recently. History limited due to difficulty arousing patient, however she denies any complaints and states 'I just want you to let me sleep.' She follows commands and is alert and oriented.   Of note, husband does state that she is prescribed oxycodone for her chronic pain. Patient does state that she took her dose of this medication this morning and may have accidentally taken too much.  The history is provided by the patient. No language interpreter was used.       Home Medications Prior to Admission medications   Medication Sig Start Date End Date Taking? Authorizing Provider  acetaminophen (TYLENOL) 325 MG tablet Take 2 tablets (650 mg total) by mouth every 6 (six) hours as needed for mild pain, moderate pain or headache. 11/19/17   Laural Benes, Clanford L, MD  apixaban (ELIQUIS) 5 MG TABS tablet Take 1 tablet (5 mg total) by mouth 2 (two) times daily. Restart on 7/14 Patient taking differently: Take 5 mg by mouth 2 (two) times daily. 01/27/16   Erick Blinks, MD  atorvastatin (LIPITOR) 80 MG tablet Take 80 mg by mouth every evening. 05/18/22   [provider]  azithromycin (ZITHROMAX) 500 MG tablet Take 1 tablet (500 mg total) by mouth daily. X 3 days 06/14/22   Catarina Hartshorn, MD  cefdinir (OMNICEF) 300 MG capsule Take 1 capsule (300 mg total) by mouth every 12 (twelve) hours. X 3 days 06/14/22    Catarina Hartshorn, MD  clonazePAM (KLONOPIN) 1 MG tablet Take 1 mg by mouth 2 (two) times daily as needed for anxiety (panic attack).    [provider]  Cyanocobalamin (B-12 PO) Take 1 tablet by mouth every evening.    [provider]  enalapril (VASOTEC) 5 MG tablet Take 1 tablet (5 mg total) by mouth every evening. 06/14/22   Catarina Hartshorn, MD  escitalopram (LEXAPRO) 20 MG tablet Take 20 mg by mouth at bedtime. 05/18/22   [provider]  fluticasone (FLONASE) 50 MCG/ACT nasal spray Place 2 sprays into both nostrils daily as needed for allergies. 03/22/22   [provider]  furosemide (LASIX) 40 MG tablet Take 40-80 mg by mouth daily as needed for edema or fluid. 03/22/22   [provider]  glipiZIDE (GLUCOTROL) 5 MG tablet Take 5 mg by mouth every evening.    [provider]  hydrochlorothiazide (HYDRODIURIL) 25 MG tablet Take 25 mg by mouth in the morning.    [provider]  JARDIANCE 10 MG TABS tablet Take 10 mg by mouth every morning. 03/22/22   [provider]  lactulose (CHRONULAC) 10 GM/15ML solution Take 10 g by mouth 2 (two) times daily. 05/26/22   [provider]  levothyroxine (SYNTHROID, LEVOTHROID) 25 MCG tablet Take 25 mcg by mouth in the morning. 07/14/15   [provider]  metFORMIN (GLUCOPHAGE) 500 MG tablet Take 1,000 mg  by mouth 2 (two) times daily. 03/22/22   [provider]  metoprolol succinate (TOPROL-XL) 50 MG 24 hr tablet Take 1 tablet (50 mg total) by mouth daily with lunch. Patient taking differently: Take 50 mg by mouth at bedtime. 12/22/17   Johnson, Clanford L, MD  ondansetron (ZOFRAN) 4 MG tablet Take 4 mg by mouth every 8 (eight) hours as needed for vomiting or nausea. 03/22/22   [provider]  oxyCODONE-acetaminophen (PERCOCET/ROXICET) 5-325 MG tablet Take 1 tablet by mouth 3 (three) times daily as needed for severe pain. 06/14/22   Catarina Hartshorn, MD  pantoprazole (PROTONIX) 40  MG tablet Take 40 mg by mouth 2 (two) times daily. 05/18/22   [provider]  potassium chloride (KLOR-CON) 10 MEQ tablet Take 10 mEq by mouth 2 (two) times daily.    [provider]  predniSONE (DELTASONE) 5 MG tablet Take 5 mg by mouth in the morning.    [provider]  senna-docusate (SENOKOT-S) 8.6-50 MG tablet Take 2 tablets by mouth at bedtime. 04/16/22   Shon Hale, MD  traZODone (DESYREL) 100 MG tablet Take 1 tablet (100 mg total) by mouth at bedtime. For sleep and Mood Patient taking differently: Take 100 mg by mouth at bedtime as needed for sleep. 04/16/22   Shon Hale, MD      Allergies    Aleve [naproxen]    Review of Systems   Review of Systems  Unable to perform ROS: Mental status change  All other systems reviewed and are negative.   Physical Exam Updated Vital Signs BP 112/66   Pulse (!) 57   Temp 97.9 F (36.6 C)   Resp 15   SpO2 93%  Physical Exam Vitals and nursing note reviewed.  Constitutional:      General: She is not in acute distress.    Appearance: Normal appearance. She is normal weight. She is not ill-appearing, toxic-appearing or diaphoretic.  HENT:     Head: Normocephalic and atraumatic.  Eyes:     Extraocular Movements: Extraocular movements intact.     Pupils: Pupils are equal, round, and reactive to light.  Cardiovascular:     Rate and Rhythm: Normal rate and regular rhythm.     Heart sounds: Normal heart sounds.  Pulmonary:     Effort: Pulmonary effort is normal. No respiratory distress.     Breath sounds: Normal breath sounds.  Abdominal:     General: Abdomen is flat.     Palpations: Abdomen is soft.  Musculoskeletal:        General: No tenderness. Normal range of motion.     Cervical back: Normal range of motion and neck supple.     Right lower leg: No edema.     Left lower leg: No edema.  Skin:    General: Skin is warm and dry.  Neurological:     General: No focal deficit present.     Mental  Status: She is alert and oriented to person, place, and time.     GCS: GCS eye subscore is 4. GCS verbal subscore is 5. GCS motor subscore is 6.     Sensory: Sensation is intact.     Motor: Motor function is intact.     Coordination: Coordination is intact.     Gait: Gait is intact.     Comments: Alert and oriented  Patient is difficult to arouse but speech is fluent, clear without dysarthria or dysphasia.    Moving extremities equally.   CN  I not tested  CN II grossly intact visual fields bilaterally. Did not visualize posterior eye.  CN III, IV, VI PERRLA and EOMs intact bilaterally  CN V Intact sensation to sharp and light touch to the face  CN VII facial movements symmetric  CN VIII not tested  CN IX, X no uvula deviation, symmetric rise of soft palate  CN XI 5/5 SCM and trapezius strength bilaterally  CN XII Midline tongue protrusion, symmetric L/R movements   Psychiatric:        Mood and Affect: Mood normal.        Behavior: Behavior normal.     ED Results / Procedures / Treatments   Labs (all labs ordered are listed, but only abnormal results are displayed) Labs Reviewed  CBC WITH DIFFERENTIAL/PLATELET - Abnormal; Notable for the following components:      Result Value   RBC 3.22 (*)    Hemoglobin 10.0 (*)    HCT 33.3 (*)    MCV 103.4 (*)    All other components within normal limits  COMPREHENSIVE METABOLIC PANEL - Abnormal; Notable for the following components:   BUN 27 (*)    Creatinine, Ser 1.57 (*)    Total Protein 6.4 (*)    Albumin 2.9 (*)    Total Bilirubin 0.2 (*)    GFR, Estimated 35 (*)    All other components within normal limits  CBG MONITORING, ED - Abnormal; Notable for the following components:   Glucose-Capillary 65 (*)    All other components within normal limits  AMMONIA  ETHANOL  URINALYSIS, ROUTINE W REFLEX MICROSCOPIC  RAPID URINE DRUG SCREEN, HOSP PERFORMED  CBG MONITORING, ED  CBG MONITORING, ED    EKG None  Radiology CT HEAD  WO CONTRAST  Result Date: 07/24/2022 CLINICAL DATA:  Mental status change of unknown cause. EXAM: CT HEAD WITHOUT CONTRAST TECHNIQUE: Contiguous axial images were obtained from the base of the skull through the vertex without intravenous contrast. RADIATION DOSE REDUCTION: This exam was performed according to the departmental dose-optimization program which includes automated exposure control, adjustment of the mA and/or kV according to patient size and/or use of iterative reconstruction technique. COMPARISON:  04/14/2022. FINDINGS: Brain: No evidence of acute infarction, hemorrhage, hydrocephalus, extra-axial collection or mass lesion/mass effect. Old right MCA distribution infarct and associated encephalomalacia, stable. Patchy areas of bilateral white matter hypoattenuation consistent with chronic microvascular ischemic change, also stable. Vascular: No hyperdense vessel or unexpected calcification. Skull: Normal. Negative for fracture or focal lesion. Sinuses/Orbits: No acute finding. Other: None. IMPRESSION: 1. No acute intracranial abnormalities. No change from the prior study. Electronically Signed   By: Lajean Manes M.D.   On: 07/24/2022 16:15   DG Chest 1 View  Result Date: 07/24/2022 CLINICAL DATA:  Altered mental status EXAM: CHEST  1 VIEW COMPARISON:  06/09/2022 FINDINGS: No focal airspace disease or effusion. Mild cardiomegaly. Aortic atherosclerosis. No pneumothorax IMPRESSION: No active disease. Mild cardiomegaly. Electronically Signed   By: Donavan Foil M.D.   On: 07/24/2022 15:38    Procedures Procedures    Medications Ordered in ED Medications  dextrose 50 % solution 50 mL (50 mLs Intravenous Given 07/24/22 1445)  naloxone (NARCAN) injection 0.4 mg (0.4 mg Intravenous Given 07/24/22 1446)    ED Course/ Medical Decision Making/ A&P                           Medical Decision Making Amount and/or Complexity of Data Reviewed  Labs: ordered. Radiology: ordered.  Risk Prescription  drug management.   This patient is a 72 y.o. female who presents to the ED for concern of altered mental status, this involves an extensive number of treatment options, and is a complaint that carries with it a high risk of complications and morbidity. The emergent differential diagnosis prior to evaluation includes, but is not limited to,  Drug overdose or withdrawal, hypoxia, hyper/hypoglycemia, encephalopathy, sepsis, DKA/HHS, brain lesion, CVA, seizure, hypothermia, heat stroke, psychiatric   This is not an exhaustive differential.   Past Medical History / Co-morbidities / Social History: Hx CVA, hypertension, diabetes, afib on Elliquis  Additional history: Chart reviewed. Pertinent results include: patient recently admitted here with septic shock  Physical Exam: Physical exam performed. The pertinent findings include: patient difficult to arouse but alert and oriented and following commands  Lab Tests: I ordered, and personally interpreted labs.  The pertinent results include:  CBG 65--> 98 after D50, hbg 10 consistent with previous. Creatinine 1.57 consistent with baseline.    Imaging Studies: I ordered imaging studies including CXR, CT head. I independently visualized and interpreted imaging which showed   CXR: no active disease  CT head: no acute findings  I agree with the radiologist interpretation.   Cardiac Monitoring:  The patient was maintained on a cardiac monitor.  My attending physician Dr. Posey Rea viewed and interpreted the cardiac monitored which showed an underlying rhythm of: no STEMI. I agree with this interpretation.   Medications: I ordered medication including D50 and narcan  for hypoglycemia and potential opioid overdose. Reevaluation of the patient after these medicines showed that the patient resolved. I have reviewed the patients home medicines and have made adjustments as needed.    Disposition:  Patient presents today with complaints of altered mental  status. She is afebrile, nontoxic-appearing, and in no acute distress with reassuring vital signs.  She is also alert and oriented and neurologically intact without focal deficits.  Initially patient was difficult to arouse, however, immediately after Narcan administration patient returned to her baseline.  She has been monitored in the emergency department for 5 hours and has remained at her baseline. Laboratory evaluation unremarkable. Suspect patient took more than her prescribed oxycodone this morning. She states it looks like another medication she takes and therefore she sometimes gets them mixed up. Patient feels ready to go home at this time. I have emphasized the importance of only taking her medication as it is prescribed and the dangers associated with opioid overdose.  Patient is understanding and amenable with plan, educated on red flag symptoms that would prompt immediate return.  Patient discharged in stable condition.   Findings and plan of care discussed with supervising physician Dr. Posey Rea who is in agreement.   Final Clinical Impression(s) / ED Diagnoses Final diagnoses:  Opioid overdose, accidental or unintentional, initial encounter [facility])    Rx / DC Orders ED Discharge Orders     None     An After Visit Summary was printed and given to the patient.     Vear Clock 07/24/22 1845    Benjiman Core, MD 07/25/22 [phone]

## 2022-07-24 NOTE — Discharge Instructions (Signed)
As we discussed, your workup in the ER today was reassuring for acute findings.  I suspect that he took too much of your prescribed narcotic medication this morning.  Is very important that you only take this as prescribed and do not take any more than is prescribed.  Follow-up with your primary care doctor in the next few days for continued evaluation and management.  Return if development of any new or worsening symptoms.

## 2022-07-24 NOTE — ED Triage Notes (Addendum)
Pt groggy. Pt states she is here bc family stated she wasn't acting right. Pt denies pain. Denies weakness. Pt states"I just feel sleepy". Pt oriented x 4. But falling asleep during triage. Color appears pale. Pt states she felt good yesterday and hasn't taken any meds that would make her sleepy.  Cbg in triage 65. Pt moving all extremities, pupills perrla.  Husband now at side and states this started this am and hasn't eaten since last night. Walks with walker very short distances. Husband denies drooling/slurring speech.

## 2022-07-24 NOTE — ED Notes (Signed)
Received pt from triage on a wheelchair. Moved pt from wc to bed with 2 people assist. Pt appears very sleepy, falling asleep while moving her from wc to bed. Pt opens eyes and responds appropriately but very lethargic. Pt reports taking Oxycodone 1 tab, her normal dose for back pain. Husband at the bedside.

## 2022-07-24 NOTE — ED Notes (Signed)
Pt more awake after Narcan. Unable to put patient on cardiac monitor at this time patient in the hallway. Charge nurse notified, states no room available at this time.

## 2023-03-14 ENCOUNTER — Ambulatory Visit: Payer: 59 | Admitting: Family Medicine

## 2023-03-24 ENCOUNTER — Emergency Department (HOSPITAL_COMMUNITY)
Admission: EM | Admit: 2023-03-24 | Discharge: 2023-03-24 | Disposition: A | Discharge status: Home / Self Care | Payer: 59 | Source: Home / Self Care

## 2023-03-25 ENCOUNTER — Other Ambulatory Visit: Payer: Self-pay

## 2023-03-25 ENCOUNTER — Encounter (HOSPITAL_COMMUNITY): Payer: Self-pay

## 2023-03-25 ENCOUNTER — Emergency Department (HOSPITAL_COMMUNITY)
Admission: EM | Admit: 2023-03-25 | Discharge: 2023-03-25 | Disposition: A | Discharge status: Home / Self Care | Payer: 59 | Attending: Emergency Medicine | Admitting: Emergency Medicine

## 2023-03-25 DIAGNOSIS — K5903 Drug induced constipation: Secondary | ICD-10-CM | POA: Insufficient documentation

## 2023-03-25 DIAGNOSIS — Z7901 Long term (current) use of anticoagulants: Secondary | ICD-10-CM | POA: Diagnosis not present

## 2023-03-25 DIAGNOSIS — N3000 Acute cystitis without hematuria: Secondary | ICD-10-CM | POA: Diagnosis not present

## 2023-03-25 DIAGNOSIS — K5900 Constipation, unspecified: Secondary | ICD-10-CM | POA: Diagnosis present

## 2023-03-25 LAB — URINALYSIS, ROUTINE W REFLEX MICROSCOPIC
Bilirubin Urine: NEGATIVE
Glucose, UA: 50 mg/dL — AB
Ketones, ur: NEGATIVE mg/dL
Nitrite: POSITIVE — AB
Protein, ur: 30 mg/dL — AB
Specific Gravity, Urine: 1.018 (ref 1.005–1.030)
WBC, UA: >50 WBC/hpf (ref 0–5)
pH: 5 (ref 5.0–8.0)

## 2023-03-25 MED ORDER — CEPHALEXIN 500 MG PO CAPS
500.0000 mg | ORAL_CAPSULE | Freq: Once | ORAL | Status: DC
  Filled 2023-03-25: qty 1

## 2023-03-25 MED ORDER — CEPHALEXIN 500 MG PO CAPS: 500.0000 mg | ORAL_CAPSULE | Freq: Three times a day (TID) | ORAL | 0 refills | Status: AC

## 2023-03-25 MED ORDER — LUBIPROSTONE 8 MCG PO CAPS: 8.0000 ug | ORAL_CAPSULE | Freq: Two times a day (BID) | ORAL | 1 refills | Status: AC

## 2023-03-25 NOTE — ED Triage Notes (Addendum)
Patient says "Has not had a bowel movement in 8 days. Have tried every over the counter remedy without any relief. Have been feeling nausea and almost vomiting. Have had painful urination for about 5 days."

## 2023-03-25 NOTE — Discharge Instructions (Signed)
Your testing shows that you do have a urinary infection, please take cephalexin 3 times a day as prescribed, for your constipation I would like for you to take a medication called MiraLAX 3 times a day until you are having regular soft stools.  If this is not helpful then I would definitely recommend that you start the other medication that I prescribed called Amitiza.  This is taken twice a day.  You will need to follow-up with your family doctor to make sure that this is working well for you, I would recommend a follow-up within 7 days.  Return to the ER for severe worsening pain.

## 2023-03-25 NOTE — ED Provider Notes (Signed)
[facility] EMERGENCY DEPARTMENT AT [facility] Provider Note   CSN: [id number] Arrival date & time: 03/25/23  4401     History  Chief Complaint  Patient presents with   Constipation   Dysuria    Megan [name] is a 72 y.o. female.   Constipation Associated symptoms: dysuria   Dysuria  This patient is a 72 year old female, she has a history of multiple visits to the emergency department over time including many related to substances including benzodiazepine intoxication, opioid overdose which was accidental and now is presenting with constipation.  She reports a history of chronic constipation but has not had a bowel movement in 8 days and is starting to feel a fullness on the left side of her abdomen.  She did push very hard this morning and had a very small brown stool that came out that is about the size of a grape.  She denies any bleeding, denies vomiting, her appetite is normal and she is eating.  She has no fevers or chills.  Incidentally she also has a small amount of dysuria going on for the last week.  She has not seen her family doctor as they have recently retired, she is scheduled to see Dr. Durwin Nora in October    Home Medications Prior to Admission medications   Medication Sig Start Date End Date Taking? Authorizing Provider  cephALEXin (KEFLEX) 500 MG capsule Take 1 capsule (500 mg total) by mouth 3 (three) times daily for 7 days. 03/25/23 04/01/23 Yes Eber Hong, MD  lubiprostone (AMITIZA) 8 MCG capsule Take 1 capsule (8 mcg total) by mouth 2 (two) times daily with a meal. 03/25/23  Yes Eber Hong, MD  acetaminophen (TYLENOL) 325 MG tablet Take 2 tablets (650 mg total) by mouth every 6 (six) hours as needed for mild pain, moderate pain or headache. 11/19/17   Laural Benes, Clanford L, MD  apixaban (ELIQUIS) 5 MG TABS tablet Take 1 tablet (5 mg total) by mouth 2 (two) times daily. Restart on 7/14 Patient taking differently: Take 5 mg by mouth 2 (two) times daily.  01/27/16   Erick Blinks, MD  atorvastatin (LIPITOR) 80 MG tablet Take 80 mg by mouth every evening. 05/18/22   [provider]  clonazePAM (KLONOPIN) 1 MG tablet Take 1 mg by mouth 2 (two) times daily as needed for anxiety (panic attack).    [provider]  Cyanocobalamin (B-12 PO) Take 1 tablet by mouth every evening.    [provider]  enalapril (VASOTEC) 5 MG tablet Take 1 tablet (5 mg total) by mouth every evening. 06/14/22   Catarina Hartshorn, MD  escitalopram (LEXAPRO) 20 MG tablet Take 20 mg by mouth at bedtime. 05/18/22   [provider]  fluticasone (FLONASE) 50 MCG/ACT nasal spray Place 2 sprays into both nostrils daily as needed for allergies. 03/22/22   [provider]  furosemide (LASIX) 40 MG tablet Take 40-80 mg by mouth daily as needed for edema or fluid. 03/22/22   [provider]  glipiZIDE (GLUCOTROL) 5 MG tablet Take 5 mg by mouth every evening.    [provider]  hydrochlorothiazide (HYDRODIURIL) 25 MG tablet Take 25 mg by mouth in the morning.    [provider]  JARDIANCE 10 MG TABS tablet Take 10 mg by mouth every morning. 03/22/22   [provider]  lactulose (CHRONULAC) 10 GM/15ML solution Take 10 g by mouth 2 (two) times daily. 05/26/22   [provider]  levothyroxine (SYNTHROID, LEVOTHROID)  25 MCG tablet Take 25 mcg by mouth in the morning. 07/14/15   [provider]  metFORMIN (GLUCOPHAGE) 500 MG tablet Take 1,000 mg by mouth 2 (two) times daily. 03/22/22   [provider]  metoprolol succinate (TOPROL-XL) 50 MG 24 hr tablet Take 1 tablet (50 mg total) by mouth daily with lunch. Patient taking differently: Take 50 mg by mouth at bedtime. 12/22/17   Johnson, Clanford L, MD  ondansetron (ZOFRAN) 4 MG tablet Take 4 mg by mouth every 8 (eight) hours as needed for vomiting or nausea. 03/22/22   [provider]  oxyCODONE-acetaminophen (PERCOCET/ROXICET) 5-325 MG tablet Take 1  tablet by mouth 3 (three) times daily as needed for severe pain. 06/14/22   Catarina Hartshorn, MD  pantoprazole (PROTONIX) 40 MG tablet Take 40 mg by mouth 2 (two) times daily. 05/18/22   [provider]  potassium chloride (KLOR-CON) 10 MEQ tablet Take 10 mEq by mouth 2 (two) times daily.    [provider]  predniSONE (DELTASONE) 5 MG tablet Take 5 mg by mouth in the morning.    [provider]  senna-docusate (SENOKOT-S) 8.6-50 MG tablet Take 2 tablets by mouth at bedtime. 04/16/22   Shon Hale, MD  traZODone (DESYREL) 100 MG tablet Take 1 tablet (100 mg total) by mouth at bedtime. For sleep and Mood Patient taking differently: Take 100 mg by mouth at bedtime as needed for sleep. 04/16/22   Shon Hale, MD      Allergies    Aleve [naproxen]    Review of Systems   Review of Systems  Gastrointestinal:  Positive for constipation.  Genitourinary:  Positive for dysuria.  All other systems reviewed and are negative.   Physical Exam Updated Vital Signs BP 132/83 (BP Location: Right Arm)   Pulse 73   Temp 98.7 F (37.1 C) (Oral)   Resp 18   Ht 1.575 m (5\' 2" )   Wt 68 kg   SpO2 100%   BMI 27.44 kg/m  Physical Exam Vitals and nursing note reviewed.  Constitutional:      General: She is not in acute distress.    Appearance: She is well-developed.  HENT:     Head: Normocephalic and atraumatic.     Mouth/Throat:     Pharynx: No oropharyngeal exudate.  Eyes:     General: No scleral icterus.       Right eye: No discharge.        Left eye: No discharge.     Conjunctiva/sclera: Conjunctivae normal.     Pupils: Pupils are equal, round, and reactive to light.  Neck:     Thyroid: No thyromegaly.     Vascular: No JVD.  Cardiovascular:     Rate and Rhythm: Normal rate and regular rhythm.     Heart sounds: Normal heart sounds. No murmur heard.    No friction rub. No gallop.  Pulmonary:     Effort: Pulmonary effort is normal. No respiratory distress.      Breath sounds: Normal breath sounds. No wheezing or rales.  Abdominal:     General: Bowel sounds are normal. There is no distension.     Palpations: Abdomen is soft. There is no mass.     Tenderness: There is no abdominal tenderness.     Comments: No abdominal masses tenderness or fullness felt.  Normal abdomen, no CVA tenderness  Musculoskeletal:        General: No tenderness. Normal range of motion.     Cervical  back: Normal range of motion and neck supple.  Lymphadenopathy:     Cervical: No cervical adenopathy.  Skin:    General: Skin is warm and dry.     Findings: No erythema or rash.  Neurological:     Mental Status: She is alert.     Coordination: Coordination normal.  Psychiatric:        Behavior: Behavior normal.     ED Results / Procedures / Treatments   Labs (all labs ordered are listed, but only abnormal results are displayed) Labs Reviewed  URINALYSIS, ROUTINE W REFLEX MICROSCOPIC - Abnormal; Notable for the following components:      Result Value   APPearance CLOUDY (*)    Glucose, UA 50 (*)    Hgb urine dipstick SMALL (*)    Protein, ur 30 (*)    Nitrite POSITIVE (*)    Leukocytes,Ua LARGE (*)    Bacteria, UA RARE (*)    All other components within normal limits    EKG None  Radiology No results found.  Procedures Procedures    Medications Ordered in ED Medications  cephALEXin (KEFLEX) capsule 500 mg (has no administration in time range)    ED Course/ Medical Decision Making/ A&P Clinical Course as of 03/25/23 1724  Sun Mar 25, 2023  1721 UA positive for infection Cephalexin ordered for here and for home [BM]    Clinical Course User Index [BM] Eber Hong, MD                                 Medical Decision Making Amount and/or Complexity of Data Reviewed Labs: ordered.  Risk Prescription drug management.    This patient presents to the ED for concern of constipation and dysuria differential diagnosis includes likely opioid  related constipation, she states that she is taking 2 or 3 doses of oxycodone every day for chronic back pain and leg pain    Additional history obtained:  Additional history obtained from medical record External records from outside source obtained and reviewed including prior admissions and ER visits, had been admitted about a year ago with infections   Lab Tests:  I Ordered, and personally interpreted labs.  The pertinent results include: Urinalysis, shows urinary infection with leukocytes and bacteria   Medicines ordered and prescription drug management:  I ordered medication including over-the-counter laxatives for home as well as some prescription medication for opiate related constipation if that does not work I have reviewed the patients home medicines and have made adjustments as needed   Problem List / ED Course:  Patient is very stable, vitals normal, abdomen benign, stable for discharge   Social Determinants of Health:  Patient agreeable to the plan           Final Clinical Impression(s) / ED Diagnoses Final diagnoses:  Acute cystitis without hematuria  Drug-induced constipation    Rx / DC Orders ED Discharge Orders          Ordered    cephALEXin (KEFLEX) 500 MG capsule  3 times daily        03/25/23 1723    lubiprostone (AMITIZA) 8 MCG capsule  2 times daily with meals        03/25/23 1723              Eber Hong, MD 03/25/23 1724

## 2023-04-19 ENCOUNTER — Ambulatory Visit: Payer: 59 | Admitting: Internal Medicine

## 2023-04-19 ENCOUNTER — Encounter: Payer: Self-pay | Admitting: Internal Medicine

## 2023-04-19 VITALS — BP 99/68 | HR 122 | Resp 16 | Ht 62.0 in | Wt 139.6 lb

## 2023-04-19 DIAGNOSIS — Z7952 Long term (current) use of systemic steroids: Secondary | ICD-10-CM

## 2023-04-19 DIAGNOSIS — E1122 Type 2 diabetes mellitus with diabetic chronic kidney disease: Secondary | ICD-10-CM

## 2023-04-19 DIAGNOSIS — I4891 Unspecified atrial fibrillation: Secondary | ICD-10-CM

## 2023-04-19 DIAGNOSIS — I10 Essential (primary) hypertension: Secondary | ICD-10-CM | POA: Diagnosis not present

## 2023-04-19 DIAGNOSIS — Z1159 Encounter for screening for other viral diseases: Secondary | ICD-10-CM

## 2023-04-19 DIAGNOSIS — K219 Gastro-esophageal reflux disease without esophagitis: Secondary | ICD-10-CM

## 2023-04-19 DIAGNOSIS — I482 Chronic atrial fibrillation, unspecified: Secondary | ICD-10-CM

## 2023-04-19 DIAGNOSIS — F419 Anxiety disorder, unspecified: Secondary | ICD-10-CM

## 2023-04-19 DIAGNOSIS — F5104 Psychophysiologic insomnia: Secondary | ICD-10-CM

## 2023-04-19 DIAGNOSIS — K5909 Other constipation: Secondary | ICD-10-CM

## 2023-04-19 DIAGNOSIS — E785 Hyperlipidemia, unspecified: Secondary | ICD-10-CM

## 2023-04-19 DIAGNOSIS — N1832 Chronic kidney disease, stage 3b: Secondary | ICD-10-CM

## 2023-04-19 DIAGNOSIS — G8929 Other chronic pain: Secondary | ICD-10-CM

## 2023-04-19 DIAGNOSIS — E039 Hypothyroidism, unspecified: Secondary | ICD-10-CM

## 2023-04-19 DIAGNOSIS — F1190 Opioid use, unspecified, uncomplicated: Secondary | ICD-10-CM

## 2023-04-19 DIAGNOSIS — Z8673 Personal history of transient ischemic attack (TIA), and cerebral infarction without residual deficits: Secondary | ICD-10-CM | POA: Diagnosis not present

## 2023-04-19 DIAGNOSIS — Z23 Encounter for immunization: Secondary | ICD-10-CM

## 2023-04-19 DIAGNOSIS — G894 Chronic pain syndrome: Secondary | ICD-10-CM

## 2023-04-19 DIAGNOSIS — N183 Chronic kidney disease, stage 3 unspecified: Secondary | ICD-10-CM

## 2023-04-19 DIAGNOSIS — F32A Depression, unspecified: Secondary | ICD-10-CM

## 2023-04-19 DIAGNOSIS — Z79899 Other long term (current) drug therapy: Secondary | ICD-10-CM

## 2023-04-19 NOTE — Progress Notes (Signed)
New Patient Office Visit  Subjective    Patient ID: Megan [name], female    DOB: 08-05-1950  Age: 72 y.o. MRN: [id number]  CC:  Chief Complaint  Patient presents with   Establish Care    Severe pain in right leg, unable to bear weight on it. Previous PCP [place]     HPI Megan [name] presents to establish care.  She is a 72 year old woman with a past medical history significant for CVA, chronic musculoskeletal pain, HTN, T2DM, atrial fibrillation, GERD, CKD 3B, anxiety and depression, hypothyroidism, chronic constipation, chronic steroid use, and insomnia.  Previously followed by Dr. Sudie Bailey.  Megan [name] endorses chronic right foot and leg pain.  She has to use a walker to ambulate.  She is otherwise asymptomatic and has no acute concerns to discuss aside from desiring to establish care.  Chronic medical conditions and outstanding preventative care items discussed today are individually addressed in A/P below.   Outpatient Encounter Medications as of 04/19/2023  Medication Sig   acetaminophen (TYLENOL) 325 MG tablet Take 2 tablets (650 mg total) by mouth every 6 (six) hours as needed for mild pain, moderate pain or headache.   apixaban (ELIQUIS) 5 MG TABS tablet Take 1 tablet (5 mg total) by mouth 2 (two) times daily. Restart on 7/14 (Patient taking differently: Take 5 mg by mouth 2 (two) times daily.)   atorvastatin (LIPITOR) 80 MG tablet Take 80 mg by mouth every evening.   Cyanocobalamin (B-12 PO) Take 1 tablet by mouth every evening.   enalapril (VASOTEC) 5 MG tablet Take 1 tablet (5 mg total) by mouth every evening.   escitalopram (LEXAPRO) 20 MG tablet Take 20 mg by mouth at bedtime.   fluticasone (FLONASE) 50 MCG/ACT nasal spray Place 2 sprays into both nostrils daily as needed for allergies.   furosemide (LASIX) 40 MG tablet Take 40-80 mg by mouth daily as needed for edema or fluid.   glipiZIDE (GLUCOTROL) 5 MG tablet Take 5 mg by mouth every evening.    hydrochlorothiazide (HYDRODIURIL) 25 MG tablet Take 25 mg by mouth in the morning.   JARDIANCE 10 MG TABS tablet Take 10 mg by mouth every morning.   lactulose (CHRONULAC) 10 GM/15ML solution Take 10 g by mouth 2 (two) times daily.   levothyroxine (SYNTHROID, LEVOTHROID) 25 MCG tablet Take 25 mcg by mouth in the morning.   lubiprostone (AMITIZA) 8 MCG capsule Take 1 capsule (8 mcg total) by mouth 2 (two) times daily with a meal.   metFORMIN (GLUCOPHAGE) 500 MG tablet Take 1,000 mg by mouth 2 (two) times daily.   metoprolol succinate (TOPROL-XL) 50 MG 24 hr tablet Take 1 tablet (50 mg total) by mouth daily with lunch. (Patient taking differently: Take 50 mg by mouth at bedtime.)   ondansetron (ZOFRAN) 4 MG tablet Take 4 mg by mouth every 8 (eight) hours as needed for vomiting or nausea.   pantoprazole (PROTONIX) 40 MG tablet Take 40 mg by mouth 2 (two) times daily.   potassium chloride (KLOR-CON) 10 MEQ tablet Take 10 mEq by mouth 2 (two) times daily.   senna-docusate (SENOKOT-S) 8.6-50 MG tablet Take 2 tablets by mouth at bedtime.   [DISCONTINUED] clonazePAM (KLONOPIN) 1 MG tablet Take 1 mg by mouth 2 (two) times daily as needed for anxiety (panic attack).   [DISCONTINUED] oxyCODONE-acetaminophen (PERCOCET/ROXICET) 5-325 MG tablet Take 1 tablet by mouth 3 (three) times daily as needed for severe pain.   [DISCONTINUED] predniSONE (DELTASONE) 5 MG tablet  Take 5 mg by mouth in the morning.   [DISCONTINUED] traZODone (DESYREL) 100 MG tablet Take 1 tablet (100 mg total) by mouth at bedtime. For sleep and Mood (Patient taking differently: Take 100 mg by mouth at bedtime as needed for sleep.)   No facility-administered encounter medications on file as of 04/19/2023.    Past Medical History:  Diagnosis Date   Agoraphobia with panic disorder    Anxiety    Arterial hypotension 12/20/2017   Arthritis    Chronic pain 03/08/2015   Hydrocodone-APAP 10/325 mg, # 120 q month   Colon polyps    Complication  of anesthesia    Depression    DM (diabetes mellitus) (HCC)    GERD (gastroesophageal reflux disease)    HTN (hypertension)    Hypotension due to drugs 12/21/2017   Hypothyroidism    Obesity    PAF (paroxysmal atrial fibrillation) (HCC)    PONV (postoperative nausea and vomiting)    Stroke (HCC) 08/20/2014   some memory loss.   Upper GI bleeding 01/20/2016    Past Surgical History:  Procedure Laterality Date   ABDOMINAL HYSTERECTOMY  15 yrs ago   with bso at aph   CARDIAC CATHETERIZATION  2004   normal   CATARACT EXTRACTION W/PHACO Right 11/10/2019   Procedure: CATARACT EXTRACTION PHACO AND INTRAOCULAR LENS PLACEMENT RIGHT EYE;  Surgeon: [name], MD;  Location: [facility];  Service: Ophthalmology;  Laterality: Right;  CDE: 46.63   CATARACT EXTRACTION W/PHACO Left 11/24/2019   Procedure: CATARACT EXTRACTION PHACO AND INTRAOCULAR LENS PLACEMENT (IOC) CDE: 7.95;  Surgeon: [name], MD;  Location: [facility];  Service: Ophthalmology;  Laterality: Left;   CHOLECYSTECTOMY  25 yrs ago   [facility]   COLONOSCOPY  05/04/11   left-sided colonic diverticulosis   COLONOSCOPY N/A 01/27/2016   diverticulosis in sigmoid colon and descending colon, active bleeding Dieulafoy lesion s/p clips X 3, next colonoscopy in 5 years due to family history of colon cancer     ESOPHAGOGASTRODUODENOSCOPY  05/04/2011   Abnormal distal esophagus, biopsies consistent with inflammation due to acid reflux. No evidence of Barrett's. No H. pylori   ESOPHAGOGASTRODUODENOSCOPY N/A 01/22/2016   erosive gastropathy, non-bleeding gastric ulcers and 1 oozing gastric ulcer with adherent clot s/p heater probe. Negative H.pylori serology.    ESOPHAGOGASTRODUODENOSCOPY (EGD) WITH PROPOFOL N/A 04/27/2016   Dr. Jena Gauss: healed ulcers. normal esophagus   RADIOLOGY WITH ANESTHESIA N/A 08/20/2015   Procedure: RADIOLOGY WITH ANESTHESIA;  Surgeon: Julieanne Cotton, MD;  Location: [facility];  Service: Radiology;  Laterality: N/A;    Family  History  Problem Relation Age of Onset   Colon cancer Paternal Aunt        2, colon cancer   Colon cancer Paternal Uncle        6, colon cancer   Colon cancer Father        deceased, age 42   Colon cancer Other        numerous cousins died before age 91 with colon cancer   Colon cancer Sister        deceased, age 67   Anesthesia problems Neg Hx    Hypotension Neg Hx    Malignant hyperthermia Neg Hx    Pseudochol deficiency Neg Hx     Social History   Socioeconomic History   Marital status: Married    Spouse name: Not on file   Number of children: 3   Years of education: Not on file   Highest education level:  Not on file  Occupational History   Occupation: disability    Employer: NOT EMPLOYED  Tobacco Use   Smoking status: Former    Current packs/day: 0.00    Average packs/day: 0.3 packs/day for 15.0 years (3.8 ttl pk-yrs)    Types: Cigarettes    Start date: 07/17/1969    Quit date: 07/18/1971    Years since quitting: 51.8   Smokeless tobacco: Former    Types: Snuff    Quit date: 08/20/2014   Tobacco comments:    never more than 1-2 cig/day in entire life  Vaping Use   Vaping status: Never Used  Substance and Sexual Activity   Alcohol use: No    Alcohol/week: 0.0 standard drinks of alcohol   Drug use: No   Sexual activity: Yes    Birth control/protection: Surgical  Other Topics Concern   Not on file  Social History Narrative   No contact with children since they left home at age 71. Doesn't know where they are.   Social Determinants of Health   Financial Resource Strain: Not on file  Food Insecurity: No Food Insecurity (06/09/2022)   Hunger Vital Sign    Worried About Running Out of Food in the Last Year: Never true    Ran Out of Food in the Last Year: Never true  Transportation Needs: No Transportation Needs (06/09/2022)   PRAPARE - Administrator, Civil Service (Medical): No    Lack of Transportation (Non-Medical): No  Physical Activity: Not on  file  Stress: Not on file  Social Connections: Not on file  Intimate Partner Violence: Not At Risk (06/09/2022)   Humiliation, Afraid, Rape, and Kick questionnaire    Fear of Current or Ex-Partner: No    Emotionally Abused: No    Physically Abused: No    Sexually Abused: No    Review of Systems  Constitutional:  Negative for chills and fever.  HENT:  Negative for sore throat.   Respiratory:  Negative for cough and shortness of breath.   Cardiovascular:  Negative for chest pain, palpitations and leg swelling.  Gastrointestinal:  Negative for abdominal pain, blood in stool, constipation, diarrhea, nausea and vomiting.  Genitourinary:  Negative for dysuria and hematuria.  Musculoskeletal:  Positive for joint pain (chronic R knee / leg pain). Negative for myalgias.  Skin:  Negative for itching and rash.  Neurological:  Negative for dizziness and headaches.  Psychiatric/Behavioral:  Negative for depression and suicidal ideas.    Objective    BP 99/68   Pulse (!) 122   Resp 16   Ht 5\' 2"  (1.575 m)   Wt 139 lb 9.6 oz (63.3 kg)   SpO2 96%   BMI 25.53 kg/m   Physical Exam Vitals reviewed.  Constitutional:      General: She is not in acute distress.    Appearance: Normal appearance. She is not toxic-appearing.     Comments: Examined in wheelchair  HENT:     Head: Normocephalic and atraumatic.     Right Ear: External ear normal.     Left Ear: External ear normal.     Nose: Nose normal. No congestion or rhinorrhea.     Mouth/Throat:     Mouth: Mucous membranes are moist.     Pharynx: Oropharynx is clear. No oropharyngeal exudate or posterior oropharyngeal erythema.  Eyes:     General: No scleral icterus.    Extraocular Movements: Extraocular movements intact.     Conjunctiva/sclera: Conjunctivae normal.  Pupils: Pupils are equal, round, and reactive to light.  Cardiovascular:     Rate and Rhythm: Normal rate. Rhythm irregular.     Pulses: Normal pulses.     Heart  sounds: Normal heart sounds. No murmur heard.    No friction rub. No gallop.     Comments: Irregularly irregular Pulmonary:     Effort: Pulmonary effort is normal.     Breath sounds: Normal breath sounds. No wheezing, rhonchi or rales.  Abdominal:     General: Abdomen is flat. Bowel sounds are normal. There is no distension.     Palpations: Abdomen is soft.     Tenderness: There is no abdominal tenderness.  Musculoskeletal:        General: No swelling. Normal range of motion.     Cervical back: Normal range of motion.     Right lower leg: No edema.     Left lower leg: No edema.  Lymphadenopathy:     Cervical: No cervical adenopathy.  Skin:    General: Skin is warm and dry.     Capillary Refill: Capillary refill takes less than 2 seconds.     Coloration: Skin is not jaundiced.  Neurological:     General: No focal deficit present.     Mental Status: She is alert and oriented to person, place, and time.  Psychiatric:        Mood and Affect: Mood normal.        Behavior: Behavior normal.    Assessment & Plan:   Problem List Items Addressed This Visit       Benign essential HTN (Chronic)    Currently prescribed enalapril 5 mg daily, HCTZ 25 mg daily, and Toprol-XL 50 mg daily.  BP today is 99/68.      Atrial fibrillation, chronic (HCC)    Irregularly irregular rate and rhythm detected on exam today.  She is currently prescribed Eliquis 5 mg twice daily and Toprol-XL 50 mg daily.      GERD (gastroesophageal reflux disease)    Symptoms are adequately controlled with Protonix 40 mg twice daily.       DM type 2 (diabetes mellitus, type 2) (HCC) (Chronic)    A1c 6.8 on labs from September 2023.  She is currently prescribed metformin 1000 mg twice daily, Jardiance 10 mg daily, and glipizide 5 mg daily. -Repeat A1c and urine microalbumin/creatinine ratio ordered today      Hypothyroidism    Currently prescribed levothyroxine 25 mcg daily.  She is asymptomatic. Repeat TFTs  ordered today.      Stage 3b chronic kidney disease (CKD) (HCC)    CKD stage IIIb.  She is currently prescribed Jardiance 10 mg daily.  Previously referred to nephrology but has not establish care.  Repeat labs have been ordered today.      H/O: CVA (cerebrovascular accident) (Chronic)    History of CVA in February 2016 with residual memory loss.  She is currently prescribed atorvastatin 80 mg daily and is taking Eliquis.      Chronic pain (Chronic)    History of chronic musculoskeletal pain.  This has most recently been managed with oxycodone-acetaminophen 5 to 325 mg 3 times daily as needed for severe pain relief.  PDMP reviewed.  UDS pending.  Controlled substance agreement signed today.      Anxiety and depression (Chronic)    Reports that her mood is stable and anxiety adequately controlled.  She is prescribed Lexapro 20 mg daily and clonazepam 1 mg  twice daily as needed.  PDMP reviewed. -No medication change today.  UDS pending.  Controlled substance agreement signed.      Constipation    Opioid-induced constipation.  This is managed with lubiprostone.      Current chronic use of systemic steroids    She is chronically prescribed prednisone 5 mg every morning.  Exact indication is unclear.      Need for influenza vaccination    Influenza vaccine administered today      Return in about 3 months (around 07/20/2023).   Billie Lade, MD

## 2023-04-19 NOTE — Patient Instructions (Signed)
It was a pleasure to see you today.  Thank you for giving [place] the opportunity to be involved in your care.  Below is a brief recap of your visit and next steps.  We will plan to see you again in 3 months.  Summary You have established care today No medication changes have been made Baseline labs ordered Urine drug test and controlled substance agreement need completion Flu shot today Follow up in 3 months

## 2023-04-21 LAB — TSH+FREE T4
Free T4: 0.98 ng/dL (ref 0.82–1.77)
TSH: 0.846 u[IU]/mL (ref 0.450–4.500)

## 2023-04-21 LAB — LIPID PANEL
Chol/HDL Ratio: 3.2 {ratio} (ref 0.0–4.4)
Cholesterol, Total: 157 mg/dL (ref 100–199)
HDL: 49 mg/dL (ref >39)
LDL Chol Calc (NIH): 86 mg/dL (ref 0–99)
Triglycerides: 122 mg/dL (ref 0–149)
VLDL Cholesterol Cal: 22 mg/dL (ref 5–40)

## 2023-04-21 LAB — CBC WITH DIFFERENTIAL/PLATELET
Basophils Absolute: 0 10*3/uL (ref 0.0–0.2)
Basos: 1 %
EOS (ABSOLUTE): 0.1 10*3/uL (ref 0.0–0.4)
Eos: 3 %
Hematocrit: 39.1 % (ref 34.0–46.6)
Hemoglobin: 12.5 g/dL (ref 11.1–15.9)
Immature Grans (Abs): 0 10*3/uL (ref 0.0–0.1)
Immature Granulocytes: 0 %
Lymphocytes Absolute: 0.9 10*3/uL (ref 0.7–3.1)
Lymphs: 26 %
MCH: 30.1 pg (ref 26.6–33.0)
MCHC: 32 g/dL (ref 31.5–35.7)
MCV: 94 fL (ref 79–97)
Monocytes Absolute: 0.1 10*3/uL (ref 0.1–0.9)
Monocytes: 3 %
Neutrophils Absolute: 2.4 10*3/uL (ref 1.4–7.0)
Neutrophils: 67 %
Platelets: 147 10*3/uL — ABNORMAL LOW (ref 150–450)
RBC: 4.15 x10E6/uL (ref 3.77–5.28)
RDW: 14.1 % (ref 11.7–15.4)
WBC: 3.6 10*3/uL (ref 3.4–10.8)

## 2023-04-21 LAB — VITAMIN D 25 HYDROXY (VIT D DEFICIENCY, FRACTURES): Vit D, 25-Hydroxy: 35.2 ng/mL (ref 30.0–100.0)

## 2023-04-21 LAB — CMP14+EGFR
ALT: 7 [IU]/L (ref 0–32)
AST: 22 [IU]/L (ref 0–40)
Albumin: 3.7 g/dL — ABNORMAL LOW (ref 3.8–4.8)
Alkaline Phosphatase: 112 [IU]/L (ref 44–121)
BUN/Creatinine Ratio: 13 (ref 12–28)
BUN: 19 mg/dL (ref 8–27)
Bilirubin Total: 0.4 mg/dL (ref 0.0–1.2)
CO2: 21 mmol/L (ref 20–29)
Calcium: 9.9 mg/dL (ref 8.7–10.3)
Chloride: 105 mmol/L (ref 96–106)
Creatinine, Ser: 1.44 mg/dL — ABNORMAL HIGH (ref 0.57–1.00)
Globulin, Total: 3.3 g/dL (ref 1.5–4.5)
Glucose: 141 mg/dL — ABNORMAL HIGH (ref 70–99)
Potassium: 4.7 mmol/L (ref 3.5–5.2)
Sodium: 139 mmol/L (ref 134–144)
Total Protein: 7 g/dL (ref 6.0–8.5)
eGFR: 38 mL/min/{1.73_m2} — ABNORMAL LOW (ref >59)

## 2023-04-21 LAB — HEMOGLOBIN A1C
Est. average glucose Bld gHb Est-mCnc: 154 mg/dL
Hgb A1c MFr Bld: 7 % — ABNORMAL HIGH (ref 4.8–5.6)

## 2023-04-21 LAB — B12 AND FOLATE PANEL
Folate: 5.1 ng/mL (ref >3.0)
Vitamin B-12: 480 pg/mL (ref 232–1245)

## 2023-04-21 LAB — MICROALBUMIN / CREATININE URINE RATIO
Creatinine, Urine: 109.7 mg/dL
Microalb/Creat Ratio: 112 mg/g{creat} — ABNORMAL HIGH (ref 0–29)
Microalbumin, Urine: 123.1 ug/mL

## 2023-04-21 LAB — HCV INTERPRETATION

## 2023-04-21 LAB — HCV AB W REFLEX TO QUANT PCR: HCV Ab: NONREACTIVE

## 2023-04-24 ENCOUNTER — Telehealth: Payer: Self-pay | Admitting: Internal Medicine

## 2023-04-24 NOTE — Telephone Encounter (Signed)
Prescription Request  04/24/2023  LOV: 04/19/2023  What is the name of the medication or equipment? oxyCODONE-acetaminophen (PERCOCET/ROXICET) 5-325 MG tablet [[id number]]   Have you contacted your pharmacy to request a refill? Yes   Which pharmacy would you like this sent to?  [facility] - [place], [place] - [address] [place] [address] Phone: [phone] Fax: [phone]    Patient notified that their request is being sent to the clinical staff for review and that they should receive a response within 2 business days.   Please advise at Mobile [phone] (mobile)

## 2023-04-25 ENCOUNTER — Ambulatory Visit: Payer: 59 | Admitting: Family Medicine

## 2023-04-25 ENCOUNTER — Other Ambulatory Visit: Payer: Self-pay | Admitting: Internal Medicine

## 2023-04-25 ENCOUNTER — Encounter: Payer: Self-pay | Admitting: Family Medicine

## 2023-04-25 VITALS — BP 122/76 | HR 99 | Ht 62.0 in | Wt 150.0 lb

## 2023-04-25 DIAGNOSIS — L304 Erythema intertrigo: Secondary | ICD-10-CM

## 2023-04-25 LAB — TOXASSURE SELECT 13 (MW), URINE

## 2023-04-25 MED ORDER — CLOTRIMAZOLE-BETAMETHASONE 1-0.05 % EX CREA
1.0000 | TOPICAL_CREAM | Freq: Two times a day (BID) | CUTANEOUS | 2 refills | Status: AC
Start: 2023-04-25 — End: ?

## 2023-04-25 MED ORDER — CLONAZEPAM 1 MG PO TABS: 1.0000 mg | ORAL_TABLET | Freq: Two times a day (BID) | ORAL | 0 refills | Status: AC | PRN

## 2023-04-25 MED ORDER — PREDNISONE 20 MG PO TABS: 20.0000 mg | ORAL_TABLET | Freq: Two times a day (BID) | ORAL | 0 refills | Status: AC

## 2023-04-25 MED ORDER — OXYCODONE-ACETAMINOPHEN 5-325 MG PO TABS: 1.0000 | ORAL_TABLET | Freq: Three times a day (TID) | ORAL | 0 refills | Status: DC | PRN

## 2023-04-25 MED ORDER — FLUCONAZOLE 200 MG PO TABS: 200.0000 mg | ORAL_TABLET | Freq: Once | ORAL | 0 refills | Status: AC

## 2023-04-25 NOTE — Patient Instructions (Signed)
        Great to see you today.  I have refilled the medication(s) we provide.    - Please take medications as prescribed. - Follow up with your primary health provider if any health concerns arises. - If symptoms worsen please contact your primary care provider and/or visit the emergency department.  

## 2023-04-25 NOTE — Assessment & Plan Note (Addendum)
Under Bilateral breast with symptoms itching, irritation. Diflucan 200 mg one dose, Clotrimazole cream 2 times daily for 14 days. Advise patient to gently wash the area with mild soap and water daily, and make sure to pat it dry thoroughly. Moisture encourages fungal growth, so keeping the area dry is essential. Opt for loose-fitting clothes made of breathable fabrics like cotton to reduce moisture buildup. Avoid tight bras or clothing that traps moisture under the breasts.

## 2023-04-25 NOTE — Progress Notes (Signed)
Patient Office Visit   Subjective   Patient ID: Megan [name], female    DOB: 10-28-1950  Age: 72 y.o. MRN: [id number]  CC:  Chief Complaint  Patient presents with   Breast Problem    Patient complains of oozing wound under both breasts starting a while ago but worsening 3 weeks ago.     HPI Megan [name] 72 year old female, presents to the clinic for bilateral redness under her breast. She  has a past medical history of Agoraphobia with panic disorder, Anxiety, Arterial hypotension (12/20/2017), Arthritis, Chronic pain (03/08/15), Colon polyps, Complication of anesthesia, Depression, DM (diabetes mellitus) (HCC), GERD (gastroesophageal reflux disease), HTN (hypertension), Hypothyroidism, Obesity, PAF (paroxysmal atrial fibrillation) (HCC), PONV (postoperative nausea and vomiting), and Stroke (HCC) (08/20/2014).  HPI    Outpatient Encounter Medications as of 04/25/2023  Medication Sig   ACCU-CHEK GUIDE test strip USE TO CHECK BLOOD SUGAROTWICE DAILY   acetaminophen (TYLENOL) 325 MG tablet Take 2 tablets (650 mg total) by mouth every 6 (six) hours as needed for mild pain, moderate pain or headache.   apixaban (ELIQUIS) 5 MG TABS tablet Take 1 tablet (5 mg total) by mouth 2 (two) times daily. Restart on 7/14 (Patient taking differently: Take 5 mg by mouth 2 (two) times daily.)   atorvastatin (LIPITOR) 80 MG tablet Take 80 mg by mouth every evening.   clonazePAM (KLONOPIN) 1 MG tablet Take 1 tablet (1 mg total) by mouth 2 (two) times daily as needed for anxiety (panic attack).   clotrimazole-betamethasone (LOTRISONE) cream Apply 1 Application topically 2 (two) times daily. Apply a thin layer to the affected area twice daily (morning and night) for 2 weeks   Cyanocobalamin (B-12 PO) Take 1 tablet by mouth every evening.   enalapril (VASOTEC) 5 MG tablet Take 1 tablet (5 mg total) by mouth every evening.   escitalopram (LEXAPRO) 20 MG tablet Take 20 mg by mouth at bedtime.    fluconazole (DIFLUCAN) 200 MG tablet Take 1 tablet (200 mg total) by mouth once for 1 dose.   fluticasone (FLONASE) 50 MCG/ACT nasal spray Place 2 sprays into both nostrils daily as needed for allergies.   furosemide (LASIX) 40 MG tablet Take 40-80 mg by mouth daily as needed for edema or fluid.   glipiZIDE (GLUCOTROL) 5 MG tablet Take 5 mg by mouth every evening.   hydrochlorothiazide (HYDRODIURIL) 25 MG tablet Take 25 mg by mouth in the morning.   JARDIANCE 10 MG TABS tablet Take 10 mg by mouth every morning.   lactulose (CHRONULAC) 10 GM/15ML solution Take 10 g by mouth 2 (two) times daily.   levothyroxine (SYNTHROID, LEVOTHROID) 25 MCG tablet Take 25 mcg by mouth in the morning.   lubiprostone (AMITIZA) 8 MCG capsule Take 1 capsule (8 mcg total) by mouth 2 (two) times daily with a meal.   metFORMIN (GLUCOPHAGE) 500 MG tablet Take 1,000 mg by mouth 2 (two) times daily.   metoprolol succinate (TOPROL-XL) 50 MG 24 hr tablet Take 1 tablet (50 mg total) by mouth daily with lunch. (Patient taking differently: Take 50 mg by mouth at bedtime.)   nystatin cream (MYCOSTATIN) Apply 1 Application topically 2 (two) times daily.   ondansetron (ZOFRAN) 4 MG tablet Take 4 mg by mouth every 8 (eight) hours as needed for vomiting or nausea.   oxyCODONE-acetaminophen (PERCOCET/ROXICET) 5-325 MG tablet Take 1 tablet by mouth 3 (three) times daily as needed for severe pain.   pantoprazole (PROTONIX) 40 MG tablet Take 40  mg by mouth 2 (two) times daily.   potassium chloride (KLOR-CON) 10 MEQ tablet Take 10 mEq by mouth 2 (two) times daily.   predniSONE (DELTASONE) 20 MG tablet Take 1 tablet (20 mg total) by mouth 2 (two) times daily with a meal for 5 days.   senna-docusate (SENOKOT-S) 8.6-50 MG tablet Take 2 tablets by mouth at bedtime.   [DISCONTINUED] fluconazole (DIFLUCAN) 100 MG tablet Take 100 mg by mouth daily.   [DISCONTINUED] predniSONE (DELTASONE) 5 MG tablet Take 5 mg by mouth in the morning.   No  facility-administered encounter medications on file as of 04/25/2023.    Past Surgical History:  Procedure Laterality Date   ABDOMINAL HYSTERECTOMY  15 yrs ago   with bso at aph   CARDIAC CATHETERIZATION  2004   normal   CATARACT EXTRACTION W/PHACO Right 11/10/2019   Procedure: CATARACT EXTRACTION PHACO AND INTRAOCULAR LENS PLACEMENT RIGHT EYE;  Surgeon: [name], MD;  Location: [facility];  Service: Ophthalmology;  Laterality: Right;  CDE: 46.63   CATARACT EXTRACTION W/PHACO Left 11/24/2019   Procedure: CATARACT EXTRACTION PHACO AND INTRAOCULAR LENS PLACEMENT (IOC) CDE: 7.95;  Surgeon: [name], MD;  Location: [facility];  Service: Ophthalmology;  Laterality: Left;   CHOLECYSTECTOMY  25 yrs ago   [facility]   COLONOSCOPY  05/04/11   left-sided colonic diverticulosis   COLONOSCOPY N/A 01/27/2016   diverticulosis in sigmoid colon and descending colon, active bleeding Dieulafoy lesion s/p clips X 3, next colonoscopy in 5 years due to family history of colon cancer     ESOPHAGOGASTRODUODENOSCOPY  05/04/2011   Abnormal distal esophagus, biopsies consistent with inflammation due to acid reflux. No evidence of Barrett's. No H. pylori   ESOPHAGOGASTRODUODENOSCOPY N/A 01/22/2016   erosive gastropathy, non-bleeding gastric ulcers and 1 oozing gastric ulcer with adherent clot s/p heater probe. Negative H.pylori serology.    ESOPHAGOGASTRODUODENOSCOPY (EGD) WITH PROPOFOL N/A 04/27/2016   Dr. Jena Gauss: healed ulcers. normal esophagus   RADIOLOGY WITH ANESTHESIA N/A 08/20/2015   Procedure: RADIOLOGY WITH ANESTHESIA;  Surgeon: Julieanne Cotton, MD;  Location: [facility];  Service: Radiology;  Laterality: N/A;    Review of Systems  Constitutional:  Negative for chills and fever.  Respiratory:  Negative for shortness of breath.   Cardiovascular:  Negative for chest pain.  Gastrointestinal:  Negative for abdominal pain.  Genitourinary:  Negative for dysuria.  Musculoskeletal:  Negative for myalgias.  Neurological:   Negative for dizziness and headaches.      Objective    BP 122/76   Pulse 99   Ht 5\' 2"  (1.575 m)   Wt 150 lb (68 kg)   SpO2 99%   BMI 27.44 kg/m   Physical Exam Vitals reviewed.  Constitutional:      General: She is not in acute distress.    Appearance: Normal appearance. She is not ill-appearing, toxic-appearing or diaphoretic.  HENT:     Head: Normocephalic.  Eyes:     General:        Right eye: No discharge.        Left eye: No discharge.     Conjunctiva/sclera: Conjunctivae normal.  Cardiovascular:     Rate and Rhythm: Normal rate.     Pulses: Normal pulses.     Heart sounds: Normal heart sounds.  Pulmonary:     Effort: Pulmonary effort is normal. No respiratory distress.     Breath sounds: Normal breath sounds.  Musculoskeletal:     Cervical back: Normal range of motion.  Skin:  Capillary Refill: Capillary refill takes less than 2 seconds.     Findings: Erythema present.     Comments: Erythema noted under bilateral breast, redness appear flat, well-defined patches No oozing or pus noted, skin is intact  Right breast normal without mass, skin or nipple changes or axillary nodes, left breast normal without mass, skin or nipple changes or axillary nodes  Neurological:     Mental Status: She is alert.     Coordination: Coordination normal.  Psychiatric:        Mood and Affect: Mood normal.       Assessment & Plan:  Intertrigo Assessment & Plan: Under Bilateral breast with symptoms itching, irritation. Diflucan 200 mg one dose, Clotrimazole cream 2 times daily for 14 days. Advise patient to gently wash the area with mild soap and water daily, and make sure to pat it dry thoroughly. Moisture encourages fungal growth, so keeping the area dry is essential. Opt for loose-fitting clothes made of breathable fabrics like cotton to reduce moisture buildup. Avoid tight bras or clothing that traps moisture under the breasts.    Orders: -     Fluconazole; Take 1  tablet (200 mg total) by mouth once for 1 dose.  Dispense: 1 tablet; Refill: 0 -     Clotrimazole-Betamethasone; Apply 1 Application topically 2 (two) times daily. Apply a thin layer to the affected area twice daily (morning and night) for 2 weeks  Dispense: 45 g; Refill: 2  Other orders -     predniSONE; Take 1 tablet (20 mg total) by mouth 2 (two) times daily with a meal for 5 days.  Dispense: 10 tablet; Refill: 0    Return if symptoms worsen or fail to improve.   Megan Lederer Newman Nip, FNP

## 2023-04-25 NOTE — Addendum Note (Signed)
Addended by: Christel Mormon E on: 04/25/2023 03:40 PM   Modules accepted: Orders

## 2023-04-25 NOTE — Telephone Encounter (Signed)
Patient advised.

## 2023-05-02 ENCOUNTER — Encounter: Payer: Self-pay | Admitting: Internal Medicine

## 2023-05-02 DIAGNOSIS — Z23 Encounter for immunization: Secondary | ICD-10-CM | POA: Insufficient documentation

## 2023-05-02 DIAGNOSIS — Z7952 Long term (current) use of systemic steroids: Secondary | ICD-10-CM | POA: Insufficient documentation

## 2023-05-02 NOTE — Assessment & Plan Note (Signed)
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  She is currently prescribed atorvastatin 80 mg daily and is taking Eliquis.

## 2023-05-02 NOTE — Assessment & Plan Note (Signed)
History of chronic musculoskeletal pain.  This has most recently been managed with oxycodone-acetaminophen 5 to 325 mg 3 times daily as needed for severe pain relief.  PDMP reviewed.  UDS pending.  Controlled substance agreement signed today.

## 2023-05-02 NOTE — Assessment & Plan Note (Signed)
CKD stage IIIb.  She is currently prescribed Jardiance 10 mg daily.  Previously referred to nephrology but has not establish care.  Repeat labs have been ordered today.

## 2023-05-02 NOTE — Assessment & Plan Note (Signed)
Irregularly irregular rate and rhythm detected on exam today.  She is currently prescribed Eliquis 5 mg twice daily and Toprol-XL 50 mg daily.

## 2023-05-02 NOTE — Assessment & Plan Note (Signed)
Influenza vaccine administered today.

## 2023-05-02 NOTE — Assessment & Plan Note (Signed)
Symptoms are adequately controlled with Protonix 40 mg twice daily.

## 2023-05-02 NOTE — Assessment & Plan Note (Signed)
Currently prescribed levothyroxine 25 mcg daily.  She is asymptomatic. Repeat TFTs ordered today.

## 2023-05-02 NOTE — Assessment & Plan Note (Signed)
A1c 6.8 on labs from September 2023.  She is currently prescribed metformin 1000 mg twice daily, Jardiance 10 mg daily, and glipizide 5 mg daily. -Repeat A1c and urine microalbumin/creatinine ratio ordered today

## 2023-05-02 NOTE — Assessment & Plan Note (Signed)
Currently prescribed enalapril 5 mg daily, HCTZ 25 mg daily, and Toprol-XL 50 mg daily.  BP today is 99/68.

## 2023-05-02 NOTE — Assessment & Plan Note (Signed)
She is chronically prescribed prednisone 5 mg every morning.  Exact indication is unclear.

## 2023-05-02 NOTE — Assessment & Plan Note (Signed)
Opioid-induced constipation.  This is managed with lubiprostone.

## 2023-05-02 NOTE — Assessment & Plan Note (Signed)
Reports that her mood is stable and anxiety adequately controlled.  She is prescribed Lexapro 20 mg daily and clonazepam 1 mg twice daily as needed.  PDMP reviewed. -No medication change today.  UDS pending.  Controlled substance agreement signed.

## 2023-05-03 LAB — HM DIABETES EYE EXAM

## 2023-05-09 ENCOUNTER — Encounter (HOSPITAL_COMMUNITY): Payer: Self-pay

## 2023-05-09 ENCOUNTER — Emergency Department (HOSPITAL_COMMUNITY)
Admission: EM | Admit: 2023-05-09 | Discharge: 2023-05-10 | Disposition: A | Discharge status: Home / Self Care | Payer: 59 | Attending: Emergency Medicine | Admitting: Emergency Medicine

## 2023-05-09 ENCOUNTER — Other Ambulatory Visit: Payer: Self-pay

## 2023-05-09 DIAGNOSIS — E1122 Type 2 diabetes mellitus with diabetic chronic kidney disease: Secondary | ICD-10-CM | POA: Insufficient documentation

## 2023-05-09 DIAGNOSIS — Z7901 Long term (current) use of anticoagulants: Secondary | ICD-10-CM | POA: Diagnosis not present

## 2023-05-09 DIAGNOSIS — F1113 Opioid abuse with withdrawal: Secondary | ICD-10-CM | POA: Insufficient documentation

## 2023-05-09 DIAGNOSIS — E039 Hypothyroidism, unspecified: Secondary | ICD-10-CM | POA: Insufficient documentation

## 2023-05-09 DIAGNOSIS — Z79899 Other long term (current) drug therapy: Secondary | ICD-10-CM | POA: Insufficient documentation

## 2023-05-09 DIAGNOSIS — I129 Hypertensive chronic kidney disease with stage 1 through stage 4 chronic kidney disease, or unspecified chronic kidney disease: Secondary | ICD-10-CM | POA: Insufficient documentation

## 2023-05-09 DIAGNOSIS — N3001 Acute cystitis with hematuria: Secondary | ICD-10-CM | POA: Diagnosis not present

## 2023-05-09 DIAGNOSIS — N183 Chronic kidney disease, stage 3 unspecified: Secondary | ICD-10-CM | POA: Diagnosis not present

## 2023-05-09 DIAGNOSIS — F1193 Opioid use, unspecified with withdrawal: Secondary | ICD-10-CM

## 2023-05-09 DIAGNOSIS — Z7984 Long term (current) use of oral hypoglycemic drugs: Secondary | ICD-10-CM | POA: Insufficient documentation

## 2023-05-09 LAB — URINALYSIS, ROUTINE W REFLEX MICROSCOPIC
Bilirubin Urine: NEGATIVE
Glucose, UA: 50 mg/dL — AB
Ketones, ur: NEGATIVE mg/dL
Nitrite: NEGATIVE
Protein, ur: 100 mg/dL — AB
Specific Gravity, Urine: 1.017 (ref 1.005–1.030)
WBC, UA: >50 WBC/hpf (ref 0–5)
pH: 6 (ref 5.0–8.0)

## 2023-05-09 LAB — CBC WITH DIFFERENTIAL/PLATELET
Abs Immature Granulocytes: 0.02 10*3/uL (ref 0.00–0.07)
Basophils Absolute: 0 10*3/uL (ref 0.0–0.1)
Basophils Relative: 0 %
Eosinophils Absolute: 0.1 10*3/uL (ref 0.0–0.5)
Eosinophils Relative: 1 %
HCT: 34.4 % — ABNORMAL LOW (ref 36.0–46.0)
Hemoglobin: 11.2 g/dL — ABNORMAL LOW (ref 12.0–15.0)
Immature Granulocytes: 0 %
Lymphocytes Relative: 26 %
Lymphs Abs: 2.2 10*3/uL (ref 0.7–4.0)
MCH: 30.6 pg (ref 26.0–34.0)
MCHC: 32.6 g/dL (ref 30.0–36.0)
MCV: 94 fL (ref 80.0–100.0)
Monocytes Absolute: 0.5 10*3/uL (ref 0.1–1.0)
Monocytes Relative: 6 %
Neutro Abs: 5.7 10*3/uL (ref 1.7–7.7)
Neutrophils Relative %: 67 %
Platelets: 180 10*3/uL (ref 150–400)
RBC: 3.66 MIL/uL — ABNORMAL LOW (ref 3.87–5.11)
RDW: 14.6 % (ref 11.5–15.5)
WBC: 8.4 10*3/uL (ref 4.0–10.5)
nRBC: 0 % (ref 0.0–0.2)

## 2023-05-09 LAB — LACTIC ACID, PLASMA: Lactic Acid, Venous: 0.6 mmol/L (ref 0.5–1.9)

## 2023-05-09 LAB — BASIC METABOLIC PANEL
Anion gap: 8 (ref 5–15)
BUN: 22 mg/dL (ref 8–23)
CO2: 26 mmol/L (ref 22–32)
Calcium: 9.1 mg/dL (ref 8.9–10.3)
Chloride: 100 mmol/L (ref 98–111)
Creatinine, Ser: 1.35 mg/dL — ABNORMAL HIGH (ref 0.44–1.00)
GFR, Estimated: 41 mL/min — ABNORMAL LOW (ref >60)
Glucose, Bld: 124 mg/dL — ABNORMAL HIGH (ref 70–99)
Potassium: 3.5 mmol/L (ref 3.5–5.1)
Sodium: 134 mmol/L — ABNORMAL LOW (ref 135–145)

## 2023-05-09 MED ORDER — METOPROLOL SUCCINATE ER 50 MG PO TB24: 50.0000 mg | ORAL_TABLET | Freq: Every day | ORAL | Status: DC

## 2023-05-09 MED ORDER — SODIUM CHLORIDE 0.9 % IV BOLUS
1000.0000 mL | Freq: Once | INTRAVENOUS | Status: AC
  Administered 2023-05-09: 1000 mL via INTRAVENOUS

## 2023-05-09 MED ORDER — SODIUM CHLORIDE 0.9 % IV SOLN
1.0000 g | Freq: Once | INTRAVENOUS | Status: AC
  Administered 2023-05-09: 1 g via INTRAVENOUS
  Filled 2023-05-09: qty 10

## 2023-05-09 MED ORDER — ENALAPRIL MALEATE 2.5 MG PO TABS
5.0000 mg | ORAL_TABLET | Freq: Once | ORAL | Status: AC
  Administered 2023-05-09: 5 mg via ORAL
  Filled 2023-05-09: qty 2

## 2023-05-09 MED ORDER — LORAZEPAM 2 MG/ML IJ SOLN
0.5000 mg | Freq: Once | INTRAMUSCULAR | Status: AC
  Administered 2023-05-09: 0.5 mg via INTRAVENOUS
  Filled 2023-05-09: qty 1

## 2023-05-09 NOTE — ED Provider Notes (Signed)
[place] EMERGENCY DEPARTMENT AT [facility] Provider Note   CSN: [id number] Arrival date & time: 05/09/23  1827     History  Chief Complaint  Patient presents with   Withdrawal    Narcotic withdrawal    Megan [name] is a 72 y.o. female.  HPI     Megan [name] is a 72 y.o. female with past medical tree of hypertension, chronic pain, prior stroke, type 2 diabetes, hypothyroidism, paroxysmal atrial fibrillation, stage III kidney disease who presents to the Emergency Department complaining of opiate withdrawal.  States that she has been on Percocet for 30 years.  She has been prescribed pain medication 3 times a day but states she has been taking it 6 times a day "because I cannot function if I do not."  States she began taking pain medication for chronic back pain.  States that her previous PCP was prescribing her pain medication but states that he retired, she saw a new PCP 1 time and her pain medication was refilled, but states that she did not like having to see a new doctor so she never went back to get a refill of her medication.  She ran out of her pain medications on Monday, has not had any pain medication in 2 days.  Today, started having shaking and twitching all over.  States that she cannot walk or perform her daily activities due to the constant twitching.  States that she came here for help because her husband threatened to leave her if she did not get help.  She denies any chest pain, shortness of breath, fever, abdominal pain, nausea or vomiting.  No diarrhea.   Home Medications Prior to Admission medications   Medication Sig Start Date End Date Taking? Authorizing Provider  ACCU-CHEK GUIDE test strip USE TO CHECK BLOOD SUGAROTWICE DAILY 12/13/22   [provider]  acetaminophen (TYLENOL) 325 MG tablet Take 2 tablets (650 mg total) by mouth every 6 (six) hours as needed for mild pain, moderate pain or headache. 11/19/17   Laural Benes, Clanford L,  MD  apixaban (ELIQUIS) 5 MG TABS tablet Take 1 tablet (5 mg total) by mouth 2 (two) times daily. Restart on 7/14 Patient taking differently: Take 5 mg by mouth 2 (two) times daily. 01/27/16   Erick Blinks, MD  atorvastatin (LIPITOR) 80 MG tablet Take 80 mg by mouth every evening. 05/18/22   [provider]  clonazePAM (KLONOPIN) 1 MG tablet Take 1 tablet (1 mg total) by mouth 2 (two) times daily as needed for anxiety (panic attack). 04/25/23   Billie Lade, MD  clotrimazole-betamethasone (LOTRISONE) cream Apply 1 Application topically 2 (two) times daily. Apply a thin layer to the affected area twice daily (morning and night) for 2 weeks 04/25/23   Del Newman Nip, Tenna Child, FNP  Cyanocobalamin (B-12 PO) Take 1 tablet by mouth every evening.    [provider]  enalapril (VASOTEC) 5 MG tablet Take 1 tablet (5 mg total) by mouth every evening. 06/14/22   Catarina Hartshorn, MD  escitalopram (LEXAPRO) 20 MG tablet Take 20 mg by mouth at bedtime. 05/18/22   [provider]  fluticasone (FLONASE) 50 MCG/ACT nasal spray Place 2 sprays into both nostrils daily as needed for allergies. 03/22/22   [provider]  furosemide (LASIX) 40 MG tablet Take 40-80 mg by mouth daily as needed for edema or fluid. 03/22/22   [provider]  glipiZIDE (GLUCOTROL) 5 MG tablet Take 5 mg by mouth  every evening.    [provider]  hydrochlorothiazide (HYDRODIURIL) 25 MG tablet Take 25 mg by mouth in the morning.    [provider]  JARDIANCE 10 MG TABS tablet Take 10 mg by mouth every morning. 03/22/22   [provider]  lactulose (CHRONULAC) 10 GM/15ML solution Take 10 g by mouth 2 (two) times daily. 05/26/22   [provider]  levothyroxine (SYNTHROID, LEVOTHROID) 25 MCG tablet Take 25 mcg by mouth in the morning. 07/14/15   [provider]  lubiprostone (AMITIZA) 8 MCG capsule Take 1 capsule (8 mcg total) by mouth 2 (two) times daily with a  meal. 03/25/23   Eber Hong, MD  metFORMIN (GLUCOPHAGE) 500 MG tablet Take 1,000 mg by mouth 2 (two) times daily. 03/22/22   [provider]  metoprolol succinate (TOPROL-XL) 50 MG 24 hr tablet Take 1 tablet (50 mg total) by mouth daily with lunch. Patient taking differently: Take 50 mg by mouth at bedtime. 12/22/17   Johnson, Clanford L, MD  nystatin cream (MYCOSTATIN) Apply 1 Application topically 2 (two) times daily. 02/06/23   [provider]  ondansetron (ZOFRAN) 4 MG tablet Take 4 mg by mouth every 8 (eight) hours as needed for vomiting or nausea. 03/22/22   [provider]  oxyCODONE-acetaminophen (PERCOCET/ROXICET) 5-325 MG tablet Take 1 tablet by mouth 3 (three) times daily as needed for severe pain. 04/25/23   Billie Lade, MD  pantoprazole (PROTONIX) 40 MG tablet Take 40 mg by mouth 2 (two) times daily. 05/18/22   [provider]  potassium chloride (KLOR-CON) 10 MEQ tablet Take 10 mEq by mouth 2 (two) times daily.    [provider]  senna-docusate (SENOKOT-S) 8.6-50 MG tablet Take 2 tablets by mouth at bedtime. 04/16/22   Shon Hale, MD      Allergies    Aleve [naproxen]    Review of Systems   Review of Systems  Constitutional:  Negative for appetite change, chills and fever.  Respiratory:  Negative for cough and shortness of breath.   Cardiovascular:  Negative for chest pain.  Gastrointestinal:  Negative for abdominal pain, diarrhea, nausea and vomiting.  Genitourinary:  Negative for dysuria.  Musculoskeletal:  Negative for back pain and neck pain.  Neurological:  Positive for tremors. Negative for dizziness, seizures, syncope, weakness and headaches.  Psychiatric/Behavioral:  Negative for confusion, hallucinations and suicidal ideas.     Physical Exam Updated Vital Signs BP (!) 127/116 (BP Location: Left Arm)   Pulse 79   Temp 98.2 F (36.8 C) (Oral)   Resp 19   Ht 5\' 2"  (1.575 m)   Wt 68 kg   SpO2 96%   BMI 27.44 kg/m   Physical Exam Vitals and nursing note reviewed.  Constitutional:      Appearance: Normal appearance. She is not toxic-appearing.  Eyes:     Extraocular Movements: Extraocular movements intact.     Pupils: Pupils are equal, round, and reactive to light.  Cardiovascular:     Rate and Rhythm: Normal rate and regular rhythm.     Pulses: Normal pulses.  Pulmonary:     Effort: Pulmonary effort is normal.  Chest:     Chest wall: No tenderness.  Abdominal:     Palpations: Abdomen is soft.     Tenderness: There is no abdominal tenderness.  Musculoskeletal:        General: No tenderness.     Right lower leg: No edema.     Left lower leg:  No edema.  Skin:    General: Skin is warm.     Capillary Refill: Capillary refill takes less than 2 seconds.  Neurological:     Mental Status: She is alert.     Sensory: No sensory deficit.     Motor: No weakness.     Comments: CN II through XII grossly intact.  Speech clear.  Alert and oriented x 3.  intermittent twitching of her extremities during my exam.  No facial tics     ED Results / Procedures / Treatments   Labs (all labs ordered are listed, but only abnormal results are displayed) Labs Reviewed  CBC WITH DIFFERENTIAL/PLATELET - Abnormal; Notable for the following components:      Result Value   RBC 3.66 (*)    Hemoglobin 11.2 (*)    HCT 34.4 (*)    All other components within normal limits  URINALYSIS, ROUTINE W REFLEX MICROSCOPIC - Abnormal; Notable for the following components:   APPearance CLOUDY (*)    Glucose, UA 50 (*)    Hgb urine dipstick MODERATE (*)    Protein, ur 100 (*)    Leukocytes,Ua LARGE (*)    Bacteria, UA RARE (*)    All other components within normal limits  BASIC METABOLIC PANEL - Abnormal; Notable for the following components:   Sodium 134 (*)    Glucose, Bld 124 (*)    Creatinine, Ser 1.35 (*)    GFR, Estimated 41 (*)    All other components within normal limits  URINE CULTURE  LACTIC ACID, PLASMA     EKG EKG Interpretation Date/Time:  Wednesday May 09 2023 18:51:33 EDT Ventricular Rate:  78 PR Interval:  153 QRS Duration:  88 QT Interval:  424 QTC Calculation: 483 R Axis:   87  Text Interpretation: Sinus arrhythmia Borderline right axis deviation Confirmed by Edwin Dada ([id number]) on 05/09/2023 10:59:24 PM  Radiology No results found.  Procedures Procedures    Medications Ordered in ED Medications - No data to display  ED Course/ Medical Decision Making/ A&P                                 Medical Decision Making Pt here requesting help for opioid withdrawal.  Has been taking Percocet 5/325 mg for 30 years for chronic pain.  Her longtime PCP recently retired.  Saw a new PCP and medication was refilled.  Patient has not returned for follow-up visits stating that it is difficult for her to see a new doctor.  Was recommended to take Percocet 1 tablet 3 times a day but has been taking 6 a day.  Took last of her medication 2 days ago.  Has been having involuntary twitching and jerking of her whole body today.    I suspect symptoms are related to opiate withdrawal.  No history of seizures.  Twitching and jerking witnessed by me during my exam.  Patient lucid and continues conversation.  No seizure-like activity witnessed.  COWS score 14    Amount and/or Complexity of Data Reviewed Labs: ordered.    Details: Labs interpreted by me no evidence of leukocytosis chemistries serum creatinine mildly elevated at baseline, lactic acid unremarkable, urinalysis shows cloudy urine with moderate blood large leukocytes greater than 50 white cells and rare bacteria.  0-5 squamous cells.  Urine culture pending ECG/medicine tests: ordered.    Details: Sinus arrhythmia borderline right axis deviation Discussion of management or  test interpretation with external provider(s): On recheck, patient resting comfortably.  Previous involuntary twitching and jerking movements have now resolved.   Patient asleep easily awoken.  Given IV fluids and Ativan here.  Also appears to have UTI IV Rocephin given and urine cultured      Risk Prescription drug management.           Final Clinical Impression(s) / ED Diagnoses Final diagnoses:  Opiate withdrawal (HCC)  Acute cystitis with hematuria    Rx / DC Orders ED Discharge Orders     None         Pauline Aus, PA-C 05/10/23 0030    Franne Forts, DO 05/17/23 [phone]

## 2023-05-09 NOTE — ED Provider Notes (Incomplete)
[place] EMERGENCY DEPARTMENT AT [facility] Provider Note   CSN: [id number] Arrival date & time: 05/09/23  1827     History {Add pertinent medical, surgical, social history, OB history to HPI:1} Chief Complaint  Patient presents with  . Withdrawal    Narcotic withdrawal    [place] Neibauer is a 72 y.o. female.  HPI     [place] Eid is a 72 y.o. female with past medical tree of hypertension, chronic pain, prior stroke, type 2 diabetes, hypothyroidism, paroxysmal atrial fibrillation, stage III kidney disease who presents to the Emergency Department complaining of opiate withdrawal.  States that she has been on Percocet for 30 years.  She has been prescribed pain medication 3 times a day but states she has been taking it 6 times a day "because I cannot function if I do not."  States she began taking pain medication for chronic back pain.  States that her previous PCP was prescribing her pain medication but states that he retired, she saw a new PCP 1 time and her pain medication was refilled, but states that she did not like having to see a new doctor so she never went back to get a refill of her medication.  She ran out of her pain medications on Monday, has not had any pain medication in 2 days.  Today, started having shaking and twitching all over.  States that she cannot walk or perform her daily activities due to the constant twitching.  States that she came here for help because her husband threatened to leave her if she did not get help.  She denies any chest pain, shortness of breath, fever, abdominal pain, nausea or vomiting.  No diarrhea.   Home Medications Prior to Admission medications   Medication Sig Start Date End Date Taking? Authorizing Provider  ACCU-CHEK GUIDE test strip USE TO CHECK BLOOD SUGAROTWICE DAILY 12/13/22   [provider]  acetaminophen (TYLENOL) 325 MG tablet Take 2 tablets (650 mg total) by mouth every 6 (six) hours as needed  for mild pain, moderate pain or headache. 11/19/17   Laural Benes, Clanford L, MD  apixaban (ELIQUIS) 5 MG TABS tablet Take 1 tablet (5 mg total) by mouth 2 (two) times daily. Restart on 7/14 Patient taking differently: Take 5 mg by mouth 2 (two) times daily. 01/27/16   Erick Blinks, MD  atorvastatin (LIPITOR) 80 MG tablet Take 80 mg by mouth every evening. 05/18/22   [provider]  clonazePAM (KLONOPIN) 1 MG tablet Take 1 tablet (1 mg total) by mouth 2 (two) times daily as needed for anxiety (panic attack). 04/25/23   Billie Lade, MD  clotrimazole-betamethasone (LOTRISONE) cream Apply 1 Application topically 2 (two) times daily. Apply a thin layer to the affected area twice daily (morning and night) for 2 weeks 04/25/23   Del Newman Nip, Tenna Child, FNP  Cyanocobalamin (B-12 PO) Take 1 tablet by mouth every evening.    [provider]  enalapril (VASOTEC) 5 MG tablet Take 1 tablet (5 mg total) by mouth every evening. 06/14/22   Catarina Hartshorn, MD  escitalopram (LEXAPRO) 20 MG tablet Take 20 mg by mouth at bedtime. 05/18/22   [provider]  fluticasone (FLONASE) 50 MCG/ACT nasal spray Place 2 sprays into both nostrils daily as needed for allergies. 03/22/22   [provider]  furosemide (LASIX) 40 MG tablet Take 40-80 mg by mouth daily as needed for edema or fluid. 03/22/22   [provider]  glipiZIDE (  GLUCOTROL) 5 MG tablet Take 5 mg by mouth every evening.    [provider]  hydrochlorothiazide (HYDRODIURIL) 25 MG tablet Take 25 mg by mouth in the morning.    [provider]  JARDIANCE 10 MG TABS tablet Take 10 mg by mouth every morning. 03/22/22   [provider]  lactulose (CHRONULAC) 10 GM/15ML solution Take 10 g by mouth 2 (two) times daily. 05/26/22   [provider]  levothyroxine (SYNTHROID, LEVOTHROID) 25 MCG tablet Take 25 mcg by mouth in the morning. 07/14/15   [provider]  lubiprostone (AMITIZA) 8 MCG  capsule Take 1 capsule (8 mcg total) by mouth 2 (two) times daily with a meal. 03/25/23   Eber Hong, MD  metFORMIN (GLUCOPHAGE) 500 MG tablet Take 1,000 mg by mouth 2 (two) times daily. 03/22/22   [provider]  metoprolol succinate (TOPROL-XL) 50 MG 24 hr tablet Take 1 tablet (50 mg total) by mouth daily with lunch. Patient taking differently: Take 50 mg by mouth at bedtime. 12/22/17   Johnson, Clanford L, MD  nystatin cream (MYCOSTATIN) Apply 1 Application topically 2 (two) times daily. 02/06/23   [provider]  ondansetron (ZOFRAN) 4 MG tablet Take 4 mg by mouth every 8 (eight) hours as needed for vomiting or nausea. 03/22/22   [provider]  oxyCODONE-acetaminophen (PERCOCET/ROXICET) 5-325 MG tablet Take 1 tablet by mouth 3 (three) times daily as needed for severe pain. 04/25/23   Billie Lade, MD  pantoprazole (PROTONIX) 40 MG tablet Take 40 mg by mouth 2 (two) times daily. 05/18/22   [provider]  potassium chloride (KLOR-CON) 10 MEQ tablet Take 10 mEq by mouth 2 (two) times daily.    [provider]  senna-docusate (SENOKOT-S) 8.6-50 MG tablet Take 2 tablets by mouth at bedtime. 04/16/22   Shon Hale, MD      Allergies    Aleve [naproxen]    Review of Systems   Review of Systems  Constitutional:  Negative for appetite change, chills and fever.  Respiratory:  Negative for cough and shortness of breath.   Cardiovascular:  Negative for chest pain.  Gastrointestinal:  Negative for abdominal pain, diarrhea, nausea and vomiting.  Genitourinary:  Negative for dysuria.  Musculoskeletal:  Negative for back pain and neck pain.  Neurological:  Positive for tremors. Negative for dizziness, seizures, syncope, weakness and headaches.  Psychiatric/Behavioral:  Negative for confusion, hallucinations and suicidal ideas.     Physical Exam Updated Vital Signs BP (!) 127/116 (BP Location: Left Arm)   Pulse 79   Temp 98.2 F (36.8 C) (Oral)    Resp 19   Ht 5\' 2"  (1.575 m)   Wt 68 kg   SpO2 96%   BMI 27.44 kg/m  Physical Exam Vitals and nursing note reviewed.  Constitutional:      Appearance: Normal appearance. She is not toxic-appearing.  Eyes:     Extraocular Movements: Extraocular movements intact.     Pupils: Pupils are equal, round, and reactive to light.  Cardiovascular:     Rate and Rhythm: Normal rate and regular rhythm.     Pulses: Normal pulses.  Pulmonary:     Effort: Pulmonary effort is normal.  Chest:     Chest wall: No tenderness.  Abdominal:     Palpations: Abdomen is soft.     Tenderness: There is no abdominal tenderness.  Musculoskeletal:        General: No tenderness.     Right lower leg:  No edema.     Left lower leg: No edema.  Skin:    General: Skin is warm.     Capillary Refill: Capillary refill takes less than 2 seconds.  Neurological:     Mental Status: She is alert.     Sensory: No sensory deficit.     Motor: No weakness.     Comments: CN II through XII grossly intact.  Speech clear.  Alert and oriented x 3.  intermittent twitching of her extremities during my exam.  No facial tics     ED Results / Procedures / Treatments   Labs (all labs ordered are listed, but only abnormal results are displayed) Labs Reviewed  CBC WITH DIFFERENTIAL/PLATELET - Abnormal; Notable for the following components:      Result Value   RBC 3.66 (*)    Hemoglobin 11.2 (*)    HCT 34.4 (*)    All other components within normal limits  URINALYSIS, ROUTINE W REFLEX MICROSCOPIC - Abnormal; Notable for the following components:   APPearance CLOUDY (*)    Glucose, UA 50 (*)    Hgb urine dipstick MODERATE (*)    Protein, ur 100 (*)    Leukocytes,Ua LARGE (*)    Bacteria, UA RARE (*)    All other components within normal limits  BASIC METABOLIC PANEL - Abnormal; Notable for the following components:   Sodium 134 (*)    Glucose, Bld 124 (*)    Creatinine, Ser 1.35 (*)    GFR, Estimated 41 (*)    All other  components within normal limits  URINE CULTURE  LACTIC ACID, PLASMA    EKG EKG Interpretation Date/Time:  Wednesday May 09 2023 18:51:33 EDT Ventricular Rate:  78 PR Interval:  153 QRS Duration:  88 QT Interval:  424 QTC Calculation: 483 R Axis:   87  Text Interpretation: Sinus arrhythmia Borderline right axis deviation Confirmed by Edwin Dada ([id number]) on 05/09/2023 10:59:24 PM  Radiology No results found.  Procedures Procedures  {Document cardiac monitor, telemetry assessment procedure when appropriate:1}  Medications Ordered in ED Medications - No data to display  ED Course/ Medical Decision Making/ A&P   {   Click here for ABCD2, HEART and other calculatorsREFRESH Note before signing :1}                              Medical Decision Making Pt here requesting help for opioid withdrawal.  Has been taking Percocet 5/325 mg for 30 years for chronic pain.  Her longtime PCP recently retired.  Saw a new PCP and medication was refilled.  Patient has not returned for follow-up visits stating that it is difficult for her to see a new doctor.  Was recommended to take Percocet 1 tablet 3 times a day but has been taking 6 a day.  Took last of her medication 2 days ago.  Has been having involuntary twitching and jerking of her whole body today.    I suspect symptoms are related to opiate withdrawal.  No history of seizures.  Twitching and jerking witnessed by me during my exam.  Patient lucid and continues conversation.  No seizure-like activity witnessed.  COWS score 14    Amount and/or Complexity of Data Reviewed Labs: ordered.    Details: Labs interpreted by me no evidence of leukocytosis chemistries serum creatinine mildly elevated at baseline, lactic acid unremarkable, urinalysis shows cloudy urine with moderate blood large leukocytes greater than 50  white cells and rare bacteria.  0-5 squamous cells.  Urine culture pending ECG/medicine tests: ordered.    Details: Sinus  arrhythmia borderline right axis deviation Discussion of management or test interpretation with external provider(s): On recheck, patient resting comfortably.  Previous involuntary twitching and jerking movements have now resolved.  Patient asleep easily awoken.  Given IV fluids and Ativan here.  Also appears to have UTI IV Rocephin given and urine cultured      Risk Prescription drug management.     {Document critical care time when appropriate:1} {Document review of labs and clinical decision tools ie heart score, Chads2Vasc2 etc:1}  {Document your independent review of radiology images, and any outside records:1} {Document your discussion with family members, caretakers, and with consultants:1} {Document social determinants of health affecting pt's care:1} {Document your decision making why or why not admission, treatments were needed:1} Final Clinical Impression(s) / ED Diagnoses Final diagnoses:  None    Rx / DC Orders ED Discharge Orders     None
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
## 2023-05-10 ENCOUNTER — Telehealth: Payer: Self-pay | Admitting: Internal Medicine

## 2023-05-10 MED ORDER — CEPHALEXIN 500 MG PO CAPS: 500.0000 mg | ORAL_CAPSULE | Freq: Three times a day (TID) | ORAL | 0 refills | Status: AC

## 2023-05-10 MED ORDER — OXYCODONE-ACETAMINOPHEN 5-325 MG PO TABS: ORAL_TABLET | ORAL | 0 refills | Status: AC

## 2023-05-10 NOTE — Telephone Encounter (Signed)
Patient spouse came by the office to speak to nurse about patient, says she has been taking all her medication in 2 weeks time. Teams message setn at 7:54 am to clinic. Call # [phone]

## 2023-05-10 NOTE — ED Notes (Signed)
Notified [facility] of patient needing transportation back home.

## 2023-05-10 NOTE — Telephone Encounter (Signed)
Spoke with husband and reiterated that we cannot provider her more medication and no referral needed for drug rehab.

## 2023-05-10 NOTE — Telephone Encounter (Signed)
Patient's husband reports that patient has a drug problem. Reports she has taken all of her pain meds up and is currently experiencing opioid withdrawal. Reports he took patient to ER last night where she was evaluated and discharged. Wants to put patient in a rehab facility but does not know what to do. Was told at ER that her doctor would need to refer her. Advised Mr Whitcomb that a provider referral is not required for drug rehab.

## 2023-05-10 NOTE — Discharge Instructions (Signed)
I recommend you taking the prescription pain medication exactly as directed for the next 3 days this will help you with your withdrawal symptoms.  Please call your primary care provider for follow-up.  You also have a urinary tract infection.  Please take the antibiotic as directed until finished.

## 2023-05-10 NOTE — ED Notes (Signed)
Attempted to call pt husband to come get her without success

## 2023-05-10 NOTE — Telephone Encounter (Signed)
Patient husband calling wanting to talk with someone said someone tried to call him and call got disconnected. Please advise  Thank you

## 2023-05-12 LAB — URINE CULTURE: Culture: 50000 — AB

## 2023-05-13 ENCOUNTER — Telehealth (HOSPITAL_BASED_OUTPATIENT_CLINIC_OR_DEPARTMENT_OTHER): Payer: Self-pay | Admitting: *Deleted

## 2023-05-13 NOTE — Telephone Encounter (Signed)
Post ED Visit - Positive Culture Follow-up  Culture report reviewed by antimicrobial stewardship pharmacist: [facility] []  Enzo Bi, Pharm.D. []  Celedonio Miyamoto, Pharm.D., BCPS AQ-ID []  Garvin Fila, Pharm.D., BCPS []  Georgina Pillion, Pharm.D., BCPS []  [place], [place].D., BCPS, AAHIVP []  Estella Husk, Pharm.D., BCPS, AAHIVP []  Lysle Pearl, PharmD, BCPS []  Phillips Climes, PharmD, BCPS []  Agapito Games, PharmD, BCPS []  Verlan Friends, PharmD []  Mervyn Gay, PharmD, BCPS [x]  Riccardo Dubin, PharmD  [facility] []  Len Childs, PharmD []  Greer Pickerel, PharmD []  Adalberto Cole, PharmD []  Perlie Gold, Rph []  Lonell Face) Jean Rosenthal, PharmD []  Earl Many, PharmD []  Junita Push, PharmD []  Dorna Leitz, PharmD []  Terrilee Files, PharmD []  Lynann Beaver, PharmD []  Keturah Barre, PharmD []  Loralee Pacas, PharmD []  Bernadene Person, PharmD   Positive urine culture Treated with Cephalexin, organism sensitive to the same and no further patient follow-up is required at this time.  Megan [name] 05/13/2023, 12:11 PM

## 2023-07-23 ENCOUNTER — Ambulatory Visit: Payer: 59 | Admitting: Internal Medicine

## 2023-07-23 ENCOUNTER — Telehealth: Payer: Self-pay

## 2023-07-23 NOTE — Telephone Encounter (Signed)
 Copied from CRM [id number]. Topic: Clinical - Lab/Test Results >> Jul 20, 2023  9:59 AM Gildardo Pounds wrote: Reason for CRM: Elon Jester, with [employer] regarding an abnormal lab taken. Per CAL, nurse is unavailable. Callback number for Elon Jester is [phone].

## 2024-07-28 ENCOUNTER — Other Ambulatory Visit (HOSPITAL_COMMUNITY): Payer: Self-pay

## 2024-07-28 DIAGNOSIS — R7989 Other specified abnormal findings of blood chemistry: Secondary | ICD-10-CM
